# Patient Record
Sex: Male | Born: 1935
Health system: Southern US, Community
[De-identification: ages and names within clinical notes are randomized; demographics above are authoritative.]

## PROBLEM LIST (undated history)

## (undated) DIAGNOSIS — K567 Ileus, unspecified: Secondary | ICD-10-CM

## (undated) DIAGNOSIS — K226 Gastro-esophageal laceration-hemorrhage syndrome: Secondary | ICD-10-CM

## (undated) DIAGNOSIS — K219 Gastro-esophageal reflux disease without esophagitis: Secondary | ICD-10-CM

## (undated) DIAGNOSIS — Z5189 Encounter for other specified aftercare: Secondary | ICD-10-CM

## (undated) DIAGNOSIS — I10 Essential (primary) hypertension: Secondary | ICD-10-CM

## (undated) DIAGNOSIS — K209 Esophagitis, unspecified without bleeding: Secondary | ICD-10-CM

## (undated) DIAGNOSIS — Z87438 Personal history of other diseases of male genital organs: Secondary | ICD-10-CM

## (undated) DIAGNOSIS — E079 Disorder of thyroid, unspecified: Secondary | ICD-10-CM

## (undated) DIAGNOSIS — D649 Anemia, unspecified: Secondary | ICD-10-CM

## (undated) DIAGNOSIS — K922 Gastrointestinal hemorrhage, unspecified: Secondary | ICD-10-CM

## (undated) DIAGNOSIS — N400 Enlarged prostate without lower urinary tract symptoms: Secondary | ICD-10-CM

## (undated) HISTORY — PX: TRANSURETHRAL RESECTION OF PROSTATE: SHX73

## (undated) HISTORY — PX: THYROIDECTOMY: SHX17

## (undated) HISTORY — DX: Encounter for other specified aftercare: Z51.89

## (undated) HISTORY — PX: CHOLECYSTECTOMY: SHX55

---

## 2001-09-25 ENCOUNTER — Ambulatory Visit (HOSPITAL_COMMUNITY): Admission: RE | Admit: 2001-09-25 | Discharge: 2001-09-25 | Payer: Self-pay | Admitting: General Surgery

## 2003-05-15 ENCOUNTER — Inpatient Hospital Stay (HOSPITAL_COMMUNITY): Admission: AD | Admit: 2003-05-15 | Discharge: 2003-05-19 | Payer: Self-pay | Admitting: Internal Medicine

## 2003-05-16 ENCOUNTER — Encounter: Payer: Self-pay | Admitting: Internal Medicine

## 2003-05-27 ENCOUNTER — Encounter: Payer: Self-pay | Admitting: Internal Medicine

## 2003-05-27 ENCOUNTER — Ambulatory Visit (HOSPITAL_COMMUNITY): Admission: RE | Admit: 2003-05-27 | Discharge: 2003-05-27 | Payer: Self-pay | Admitting: Internal Medicine

## 2003-10-29 ENCOUNTER — Ambulatory Visit (HOSPITAL_COMMUNITY): Admission: RE | Admit: 2003-10-29 | Discharge: 2003-10-29 | Payer: Self-pay | Admitting: Internal Medicine

## 2003-10-29 HISTORY — PX: COLONOSCOPY: SHX174

## 2006-10-09 ENCOUNTER — Emergency Department (HOSPITAL_COMMUNITY): Admission: EM | Admit: 2006-10-09 | Discharge: 2006-10-09 | Payer: Self-pay | Admitting: Emergency Medicine

## 2006-10-15 ENCOUNTER — Inpatient Hospital Stay (HOSPITAL_COMMUNITY): Admission: RE | Admit: 2006-10-15 | Discharge: 2006-10-17 | Payer: Self-pay | Admitting: Urology

## 2006-10-15 ENCOUNTER — Encounter (INDEPENDENT_AMBULATORY_CARE_PROVIDER_SITE_OTHER): Payer: Self-pay | Admitting: *Deleted

## 2008-08-14 ENCOUNTER — Ambulatory Visit: Payer: Self-pay | Admitting: Internal Medicine

## 2008-08-31 ENCOUNTER — Ambulatory Visit: Payer: Self-pay | Admitting: Internal Medicine

## 2008-08-31 ENCOUNTER — Ambulatory Visit (HOSPITAL_COMMUNITY): Admission: RE | Admit: 2008-08-31 | Discharge: 2008-08-31 | Payer: Self-pay | Admitting: Internal Medicine

## 2008-08-31 HISTORY — PX: ESOPHAGOGASTRODUODENOSCOPY: SHX1529

## 2008-09-02 ENCOUNTER — Encounter: Payer: Self-pay | Admitting: Internal Medicine

## 2008-09-02 LAB — CONVERTED CEMR LAB: Creatinine, Ser: 1.14 mg/dL (ref 0.40–1.50)

## 2008-09-09 ENCOUNTER — Ambulatory Visit (HOSPITAL_COMMUNITY): Admission: RE | Admit: 2008-09-09 | Discharge: 2008-09-09 | Payer: Self-pay | Admitting: Internal Medicine

## 2008-09-16 ENCOUNTER — Ambulatory Visit (HOSPITAL_COMMUNITY): Admission: RE | Admit: 2008-09-16 | Discharge: 2008-09-16 | Payer: Self-pay | Admitting: Surgery

## 2008-09-17 ENCOUNTER — Ambulatory Visit: Payer: Self-pay | Admitting: Surgery

## 2008-11-19 ENCOUNTER — Encounter (INDEPENDENT_AMBULATORY_CARE_PROVIDER_SITE_OTHER): Payer: Self-pay | Admitting: Surgery

## 2008-11-19 ENCOUNTER — Ambulatory Visit: Payer: Self-pay | Admitting: Surgery

## 2008-11-19 ENCOUNTER — Ambulatory Visit (HOSPITAL_COMMUNITY): Admission: RE | Admit: 2008-11-19 | Discharge: 2008-11-22 | Payer: Self-pay | Admitting: Surgery

## 2008-12-30 ENCOUNTER — Encounter: Payer: Self-pay | Admitting: Internal Medicine

## 2009-01-27 ENCOUNTER — Encounter: Payer: Self-pay | Admitting: Internal Medicine

## 2010-12-15 LAB — CALCIUM
Calcium: 7.6 mg/dL — ABNORMAL LOW (ref 8.4–10.5)
Calcium: 7.8 mg/dL — ABNORMAL LOW (ref 8.4–10.5)
Calcium: 7.9 mg/dL — ABNORMAL LOW (ref 8.4–10.5)
Calcium: 8.3 mg/dL — ABNORMAL LOW (ref 8.4–10.5)
Calcium: 8.3 mg/dL — ABNORMAL LOW (ref 8.4–10.5)

## 2010-12-15 LAB — DIFFERENTIAL
Basophils Absolute: 0 10*3/uL (ref 0.0–0.1)
Basophils Relative: 0 % (ref 0–1)
Monocytes Relative: 9 % (ref 3–12)
Neutro Abs: 3.2 10*3/uL (ref 1.7–7.7)
Neutrophils Relative %: 56 % (ref 43–77)

## 2010-12-15 LAB — CBC
MCHC: 32.7 g/dL (ref 30.0–36.0)
Platelets: 239 10*3/uL (ref 150–400)
RBC: 5.35 MIL/uL (ref 4.22–5.81)
RDW: 16.5 % — ABNORMAL HIGH (ref 11.5–15.5)

## 2010-12-15 LAB — URINALYSIS, ROUTINE W REFLEX MICROSCOPIC
Ketones, ur: NEGATIVE mg/dL
Nitrite: NEGATIVE
Protein, ur: 100 mg/dL — AB
Urobilinogen, UA: 1 mg/dL (ref 0.0–1.0)

## 2010-12-15 LAB — BASIC METABOLIC PANEL
BUN: 11 mg/dL (ref 6–23)
CO2: 31 mEq/L (ref 19–32)
Calcium: 9.4 mg/dL (ref 8.4–10.5)
Creatinine, Ser: 1.12 mg/dL (ref 0.4–1.5)
GFR calc Af Amer: >60 mL/min (ref >60)

## 2010-12-15 LAB — PROTIME-INR
INR: 1 (ref 0.00–1.49)
Prothrombin Time: 13.1 seconds (ref 11.6–15.2)

## 2010-12-19 LAB — GLUCOSE, CAPILLARY: Glucose-Capillary: 103 mg/dL — ABNORMAL HIGH (ref 70–99)

## 2011-01-17 NOTE — Consult Note (Signed)
NEW PATIENT CONSULTATION   Victor Hunter, Victor Hunter  DOB:  [birth year]                                        September 17, 2008  CHART #:  [id number]   REASON FOR CONSULTATION:  Mediastinal mass.   CLINICAL HISTORY:  I was asked to see the patient in the [facility]  today by Dr. Roetta Sessions for evaluation of a large mediastinal mass.  He is a 77 year old African American male who was referred to Dr. Jena Gauss  for evaluation of persistent hiccups that is going on for about a week  and a half.  He had previously had hiccups in 2004 when he presented  with an upper GI bleed and ileus.  During his most recent presentation,  he said that he was having hiccups all day long.  They usually resolve  by the time, he went to bed, but then started again in the morning.  He  underwent an EGD on 08/31/2008, which showed possible extrinsic  compression on the midesophagus with a noncritical Schatzki ring.  He  has small hiatal hernia.  The stomach was normal.  The first and second  portion of the duodenum were normal.  He subsequently underwent a CT  scan of the chest to evaluate the esophageal compression.  This showed a  7.0 x 5.7 x 6.5-cm left superior mediastinal mass showing a significant  heterogeneous internal enhancement with small inferior calcification.  This was felt to be a possible thyroid malignancy.  There is no thoracic  adenopathy.  There is a 2-mm right lower lobe pulmonary nodule that was  nonspecific.  He subsequently underwent a PET scan on 09/16/2008, which  showed marked enlargement of the left thyroid lobe with low level  heterogeneous metabolism that was nonspecific.  The right lower lobe  lung nodule was only 2 mm and below the resolution of PET scan.  There  are no other areas of increased tracer uptake.  Also noted, there was  marked prostate enlargement.   REVIEW OF SYSTEMS:  General:  He denies any fever or chills.  He has had  no recent weight changes.   He denies fatigue.  Eyes:  Negative.  ENT:  Negative.  Endocrine:  He denies diabetes and hypothyroidism.  Cardiovascular:  He denies any chest pain or pressure.  He has had no  PND or orthopnea.  He denies social dyspnea.  He denies palpitations.  Respiratory:  He denies cough and sputum production.  He has had hiccups  as mentioned above.  GI:  He denies nausea and vomiting.  He reports  occasional dysphagia and feeling like things are sticking in his upper  chest on swallowing.  He has a history of constipation.  GU:  He denies  dysuria and hematuria.  He does have difficulty passing his urine at  times.  Vascular:  He denies claudication and phlebitis.  Neurological:  Denies any history of dizziness.  He denies any focal weakness or  numbness.  He does report passing out in the past.  He has never had TIA  or stroke.  Psychiatric:  Negative.  Hematological:  Negative.   ALLERGIES:  None.   MEDICATIONS:  HCTZ 25 mg daily, Protonix 40 mg daily, diltiazem XR 180  mg daily, and aspirin 81 mg daily.   PAST MEDICAL HISTORY:  Significant for history  of upper GI bleed in the  past associated with ileus in 2004.  This was felt to be secondary to a  Mallory-Weiss tear.  He has a history of chronic anemia.  He has a  history of hiccups as mentioned above.  He has a history of prostate  enlargement and elevated PSA and is followed by Dr. Rito Ehrlich.  He has a  history of distal esophageal mucosal erosions in 2005 and was biopsied  showing atypical squamous mucosa consistent with moderate-to-high-grade  squamous dysplasia.  Repeat endoscopy reportedly showed benign biopsies.  He has a history of inflammatory polyp removed from the colon in the  past.  He is status post cholecystectomy and status post right knee  arthroscopy.  He is status post surgery on his prostate, but he is not  sure what was done.   FAMILY HISTORY:  His sister has liver disease.  One sister died of  breast cancer.  He has  3 brothers with significant hypertension.  His  mother died from complications of hypertension.  Father died of a  stroke.   SOCIAL HISTORY:  He is married and has 4 grown children.  He is a  retired Naval architect.  He works part-time TEFL teacher.  He smoked,  but quit 30 years ago.  He occasionally drinks alcohol few times a year.   PHYSICAL EXAMINATION:  VITAL SIGNS:  His blood pressure is 137/81, his  pulse is 73 and regular, his respiratory rate is 18 and unlabored.  Oxygen saturation on room air is 97%.  GENERAL:  He is a stocky well-developed [place] American male, in no  distress.  HEENT:  Normocephalic and atraumatic.  Pupils are equal and reactive to  light and accommodation.  Extraocular muscles are intact.  His throat is  clear.  NECK:  Normal carotid pulses bilaterally.  There are no bruits.  There  is no adenopathy.  There is some prominence of the left side of the  neck, which may be due to this thyroid enlargement.  CARDIAC:  Regular rate and rhythm with normal S1 and S2.  There is no  murmur, rub, or gallop.  LUNGS:  Clear.  ABDOMEN:  Active bowel sounds.  His abdomen is soft and nontender.  There are no palpable masses or organomegaly.  EXTREMITIES:  No peripheral edema.  NEUROLOGIC:  Alert and oriented x3.  Motor and sensory exams are grossly  normal.   IMPRESSION:  The patient has a large left thyroid lobe mass, which I  think is most likely a goiter with substernal extension.  It is possible  there could be some malignancy within this, although his PET scan was  nonspecific.  The mediastinal extension could be responsible for some of  his symptoms including his hiccups and intermittent dysphagia.  I would  recommend surgical removal possibly through a neck incision, but this  may require partial sternotomy.  I will discuss the case with Dr. Darnell Level to get his opinion about whether we should perform a biopsy first  or proceed right to surgical removal.  I  discussed all this with the  patient and his family.  They are in full agreement.  I told him that I  would discuss with Dr. Gerrit Friends tomorrow and will call them with plans  after I do that.  They are in full gradient.   Evelene Croon, M.D.  Electronically Signed   BB/MEDQ  D:  09/17/2008  T:  09/17/2008  Job:  [id number]  cc:   Darnell Level, MD  R. Roetta Sessions, M.D.  Tesfaye D. Felecia Shelling, MD

## 2011-01-17 NOTE — Op Note (Signed)
Victor Hunter, Victor Hunter NO.:  [id number]   MEDICAL RECORD NO.:  [id number]          PATIENT TYPE:  OIB   LOCATION:  5151                         FACILITY:  [facility]   PHYSICIAN:  Velora Heckler, MD      DATE OF BIRTH:  [birth year]   DATE OF PROCEDURE:  11/19/2008  DATE OF DISCHARGE:                               OPERATIVE REPORT   PREOPERATIVE DIAGNOSIS:  Substernal thyroid goiter.   POSTOPERATIVE DIAGNOSIS:  Substernal thyroid goiter.   PROCEDURE:  Total thyroidectomy (cervical approach).   CO-SURGEONS:  Velora Heckler, MD and Evelene Croon, MD   ANESTHESIA:  General per Dr. Diamantina Monks.   ESTIMATED BLOOD LOSS:  Minimal.   PREPARATION:  Betadine.   COMPLICATIONS:  None.   INDICATIONS:  The patient is a 77 year old black male from [place],  [place].  He was undergoing a gastroenterology workup which  included upper endoscopy.  This demonstrated deviation of his esophagus.  CT scan of the chest was obtained and demonstrated a heterogeneous mass  in the left upper mediastinum which appeared to originate from the left  thyroid lobe.  It contained calcifications and was felt to be suspicious  for thyroid malignancy.  The patient was seen by Dr. Evelene Croon in  consultation.  He was referred to my office for thyroidectomy from  cervical approach with the possibility of median sternotomy if required.  The patient now comes to the operating room.   BODY OF REPORT:  Procedure was done in OR #11 at the [facility].  The patient is brought to the operating room, placed  in supine position on the operating room table.  Following  administration of general anesthesia, the patient is positioned and then  prepped and draped in usual strict aseptic fashion.  After ascertaining  that an adequate level of anesthesia have been achieved, a cervical  Kocher incision is made with a #15 blade.  Dissection was carried  through subcutaneous tissues and  platysma and hemostasis was obtained  with the electrocautery.  Skin flaps were elevated cephalad and caudad  from the thyroid notch to the sternal notch.  A Mahorner self-retaining  retractor was placed for exposure.  Larynx and airway are markedly  deviated to the right.  They are approximately 3 cm off of midline.  There is a large mass in the left thyroid bed.  There are numerous 1-2  cm cyst in the superior pole of the gland.  These were palpable.   Dissection is begun in the midline.  Strap muscles were incised in the  midline.  External jugular veins were divided between hemostats and  ligated with 2-0 silk ties.  Strap muscles are reflected to the left.  Left thyroid lobe was exposed.  It is gently mobilized with blunt  dissection.  Venous tributaries were divided between medium Ligaclips.  Gland is gently dissected out.  There is an inflammatory component.  Superior pole vessels are dissected out, ligated in continuity with 2-0  silk ties and medium Ligaclips and divided.  This allows for a plane to  be developed laterally and posteriorly back to the precervical fascia.  Dissection is carried distally where a large mass occupies the lower  left neck and extends into the mediastinum.  Venous tributaries are  divided between medium Ligaclips with the Harmonic scalpel.  Tissue  planes are developed allowing for mobilization of the mass.  Branches of  the inferior thyroid artery are divided between small and medium  Ligaclips with the Harmonic scalpel.  Both superior and inferior  parathyroid glands are identified and preserved on their vascular  pedicle.  With some difficulty, the large left thyroid lobe and inferior  pole mass is delivered out of the mediastinum and into the neck.  It was  rotated anteriorly.  The inferior venous tributaries were ligated in  continuity with 2-0 silk ties and divided with Harmonic scalpel.  Branches of the inferior thyroid artery are divided between  small and  medium Ligaclips.  The recurrent laryngeal nerve is identified and  preserved.  Ligament of Allyson Sabal is transected with electrocautery and the  gland is mobilized up and onto the anterior trachea.  A dry pack is  placed in the left neck.  The isthmus is transected with the Harmonic  scalpel and the entire left lobe is submitted to Pathology for review.   Next, we turned our attention to the right thyroid lobe.  Again strap  muscles are reflected laterally and the right lobe is exposed.  The  right lobe is small but quite firm.  There are no discrete nodules or  masses.  There are no cyst.  Superior pole vessels are dissected out and  divided between medium Ligaclips with the Harmonic scalpel.  Inferior  venous tributaries are divided between medium Ligaclips with the  Harmonic scalpel.  Middle thyroid vein is divided between small  Ligaclips with the Harmonic scalpel.  Gland is rolled anteriorly.  Inferior parathyroid gland is identified and preserved.  Branches of the  inferior thyroid artery are divided between small Ligaclips.  Recurrent  nerve is identified and preserved.  Again with some difficulty, the  ligament of Allyson Sabal is finally transected using the electrocautery for  hemostasis and preserving the recurrent nerve.  The remaining gland is  excised off the anterior trachea.  The entire lobe is submitted to  Pathology and labeled as right thyroid lobe.   Next, the neck is irrigated with warm saline bilaterally.  Hemostasis is  obtained with small and medium Ligaclips.  Surgicel is placed in the  operative field.  Strap muscles are reapproximated in the midline with  interrupted 3-0 Vicryl sutures.  Platysma was closed with interrupted 3-  0 Vicryl sutures.  Skin is closed with a running 4-0 Monocryl  subcuticular suture.  Wound is washed and dried and benzoin and Steri-  Strips are applied.  Sterile dressings are applied.  The patient is  awakened from anesthesia and  brought to the recovery room in stable  condition.  The patient tolerated the procedure well.      Velora Heckler, MD  Electronically Signed     TMG/MEDQ  D:  11/19/2008  T:  11/19/2008  Job:  [id number]   cc:   Evelene Croon, M.D.  Jonathon Bellows, M.D.  Tesfaye D. Felecia Shelling, MD

## 2011-01-17 NOTE — Consult Note (Signed)
NAME:  ABDURRAHMAN, Victor Hunter              ACCOUNT NO.:  [id number]   MEDICAL RECORD NO.:  [id number]           PATIENT TYPE:  AMB   LOCATION:  DAY                           FACILITY:  [facility]   PHYSICIAN:  R. Roetta Sessions, M.D. DATE OF BIRTH:  20-Nov-1933   DATE OF CONSULTATION:  DATE OF DISCHARGE:                                 CONSULTATION   REASON FOR CONSULTATION:  Persistent hiccups.   HPI:  Victor Hunter is a 77 year old African American male.  He has had  persistent hiccups for about a week and a half now.  He has previously  presented with persistent hiccups back in 2004 when he had upper GI  bleed and ileus.  He tells me his hiccups are lasting all day long.  They usually resolve by the time he goes to bed and they are back again  first thing in the morning.  He denies any heartburn or indigestion.  He  is taking pantoprazole 40 mg daily and has for years.  He denies any  nausea, vomiting, dysphagia, odynophagia, anorexia, or early satiety.  Denies any abdominal pain or bloating.  His weight has remained stable.  He denies any headache, dizziness, or neurologic changes.  He  occasionally has constipation.  He has used some Dulcolax for this.  He  can go up to 3 days without a bowel movement.  He denies any rectal  bleeding or melena.   PAST MEDICAL AND SURGICAL HISTORY:  1. He has history of upper GI bleed, ileus.  2. When he presented with hiccups, was admitted to [facility] and transferred to [facility].  Felt to have      Mallory-Weiss tear.  3. History of chronic anemia.  4. Prostatomegaly and elevated PSA.  5. He has history of distal esophageal mucosal erosions which appeared      abnormal by Dr. Jena Gauss.  On October 29, 2003, EGD.  He was sent to      [facility] for EUS.  His      biopsies from [facility] showed atypical stratified squamous mucosa      consistent with moderate to high-grade squamous dysplasia  occurring      in the background of chronic active esophagitis.  EUS was deferred      and repeat EGD was performed by Dr. Opal Sidles with biopsies that were      benign.  6. He had an inflammatory polyp removed from 40 cm on colonoscopy by      Dr. Jena Gauss, October 29, 2003.  7. He is status post cholecystectomy.  8. He has had right knee arthroscopy.  9. Some type of bladder surgery.   CURRENT MEDICATIONS:  1. Hydrochlorothiazide 25 mg daily.  2. Pantoprazole 40 mg daily.  3. Diltiazem 180 mg daily.  4. Aspirin 81 mg daily.   ALLERGIES:  NO KNOWN DRUG ALLERGIES.   FAMILY HISTORY:  Positive for sister with liver problems, etiology  unknown.  He has lost a sister to breast carcinoma.  He has 3 brothers  with history significant for hypertension.  Mother deceased secondary to  complications from hypertension.  Father deceased secondary to CVA.   SOCIAL HISTORY:  Victor Hunter is married.  He has 4 grown healthy  children.  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  He denies any tobacco or drug use.  He consumes a couple  alcoholic beverages per year.   REVIEW OF SYSTEMS:  See HPI, otherwise negative.   PHYSICAL EXAM:  VITAL SIGNS:  Weight 175 pounds.  Height 63 inches.  Temperature 98.5.  blood pressure 142/82.  Pulse 88.  GENERAL:  He is a well-developed, well-nourished, elderly, African  American male who is alert, oriented, pleasant, and cooperative.  HEENT:  Sclerae clear, nonicteric. Conjunctivae pink.  Oropharynx pink  and moist without lesions.  NECK:  Supple without any mass or thyromegaly.  CHEST:  Heart, regular rate and rhythm.  Normal S1 and S2 without  murmurs, clicks, rubs, or gallops.  LUNGS:  Clear to auscultation bilaterally.  ABDOMEN:  Positive bowel sounds x4.  No bruits auscultated.  Soft,  nontender, nondistended without palpable mass or hepatosplenomegaly.  No  rebound tenderness or guarding.  EXTREMITIES:  Without clubbing  or edema.   ASSESSMENT:  Victor Hunter is a 77 year old African American male with  recurrent hiccups of a week and a half duration.  He has history of this  previously back in 2004 when he had upper GI bleed and ileus suspected  to be due to Mallory-Weiss tear.  He shows no signs of ileus on  abdominal exam today.  He did have abnormal distal esophagus on last EGD  by Dr. Jena Gauss and I feel he is going to require further evaluation of his  upper GI tract to rule out peptic ulcer disease, gastric outlet  obstruction, or any other lesion which could be irritating diaphragm.   PLAN:  EGD with Dr. Jena Gauss in the near future.  Discuss this procedure  including risks and benefits which include but are not limited to  bleeding, infection, perforation, drug reaction and he agrees and  planned consent will be obtained.  Continue pantoprazole 40 mg daily.   Thank you, Dr. Felecia Shelling, for allowing [place] to participate in the care of [name].      Lorenza Burton, N.P.      Jonathon Bellows, M.D.  Electronically Signed    KJ/MEDQ  D:  08/14/2008  T:  08/14/2008  Job:  [id number]   cc:   Tesfaye D. Felecia Shelling, MD  Fax: [phone]

## 2011-01-17 NOTE — Op Note (Signed)
NAME:  Victor Hunter, Victor Hunter NO.:  [id number]   MEDICAL RECORD NO.:  [id number]          PATIENT TYPE:  AMB   LOCATION:  DAY                           FACILITY:  [facility]   PHYSICIAN:  R. Roetta Sessions, M.D. DATE OF BIRTH:  Dec 17, 1933   DATE OF PROCEDURE:  08/31/2008  DATE OF DISCHARGE:                               OPERATIVE REPORT   INDICATIONS FOR PROCEDURE:  A 77 year old gentleman with recurrent  intermittent hiccups.  He has had symptoms intermittently for years.  EGD is now being done.  Risks, benefits, alternatives, and limitations  have been reviewed previously and again today at the bedside.  Please  see the documentation medical record.   PROCEDURE NOTE:  O2 saturation, blood pressure, pulse, and respirations  were monitored throughout the entire procedure.   CONSCIOUS SEDATION:  Versed 3 mg IV, Demerol 75 mg IV in divided doses.  Cetacaine spray for topical pharyngeal anesthesia.   INSTRUMENTATION:  Pentax video chip system.   FINDINGS:  Examination of the tubular esophagus revealed a prominent  submucosal vascular pattern in the very proximal esophagus atypical for  esophageal varices.  Please see photos.  Also, there appeared to be some  extrinsic compression on the mid esophageal body encroaching on the  lumen.  Please see photos.  There was a noncritical Schatzki ring,  otherwise esophageal mucosa appeared entirely normal.  EG junction  easily traversed.  Stomach:  Gastric cavity was emptied and insufflated  well with air.  Thorough examination of the gastric mucosa including  retroflexed view of the proximal stomach esophagogastric junction  demonstrated multiple 1-3 mm hyperplastic appearing fundic gland polyps  and a small hiatal hernia only.  There was no ulcer infiltrating process  seen.  Pylorus was patent, easily traversed.  Examination of the bulb  and second portion revealed no abnormalities.  Therapeutic/diagnostic  maneuvers were performed,  none.   The patient tolerated the procedure well and was reacted in endoscopy.   IMPRESSION:  Possible extrinsic impression on the mid esophagus,  noncritical Schatzki ring, not manipulated prominent submucosal vascular  pattern in the very proximal esophagus atypical for esophageal varices.  One 3-mm fundal gland polyps and multiple gastric fundic gland polyps  not manipulated, small hiatal hernia, otherwise normal stomach and  patent pylorus, normal D1 and D2.   RECOMMENDATIONS:  We will proceed with abdominal and chest CT to further  evaluate his symptoms and endoscopic findings.  Further recommendations  to follow him in the very near future.      Jonathon Bellows, M.D.  Electronically Signed     RMR/MEDQ  D:  08/31/2008  T:  08/31/2008  Job:  [id number]   cc:   Tesfaye D. Felecia Shelling, MD  Fax: [phone]

## 2011-01-20 NOTE — Op Note (Signed)
NAME:  Victor Hunter, Victor Hunter                        ACCOUNT NO.:  [id number]   MEDICAL RECORD NO.:  [id number]                   PATIENT TYPE:  AMB   LOCATION:  DAY                                  FACILITY:  [facility]   PHYSICIAN:  R. Roetta Sessions, M.D.              DATE OF BIRTH:  07-07-1934   DATE OF PROCEDURE:  DATE OF DISCHARGE:                                 OPERATIVE REPORT   PROCEDURE:  Esophagogastroduodenoscopy with biopsy followed by colonoscopy.   ENDOSCOPIST:  Gerrit Friends. Rourk, M.D.   INDICATIONS FOR PROCEDURE:  The patient is a 77 year old gentleman with  chronic anemia and history of Mallory-Weiss tear with recurrent hiccups.  He  has been fairly extensively evaluated last year.  He has now had recurrent  hiccups.  It has been some time since he had a colonoscopy.  EGD and  colonoscopy are now being done.  This approach has been discussed with the  patient at length, previously.  The potential risks, benefits, and  alternatives have been reviewed; questions answered.  It is notable that he  is Hemoccult positive.  He has an elevated PSA in the 15 range which has not  been worked up as of yet.   INSTRUMENT:  Olympus GIF-140 gastroscope and CF 140 colonoscope.   EGD FINDINGS:  Examination of the tubular esophagus revealed a denuded  appearing, erythematous mucosa involving one-third of the circumferential of  the distal one-third of the esophagus.  This appeared to be a very wide  erosion with a couple of islands of normal appearing esophageal mucosa.  This does not appear consistent with Barrett's esophagus; please see photos.  The esophageal mucosa otherwise appeared normal.  The EG junction was easily  traversed.   STOMACH:  The gastric cavity was empty.  It insufflated well with air.  A  thorough examination of the gastric mucosa including a retroflex view of the  proximal stomach and esophagogastric junction demonstrated a small hiatal  hernia.  The pylorus was patent  and easily traversed.  Examination of the  bulb and the second portion revealed no abnormalities.   THERAPEUTIC/DIAGNOSTIC MANEUVERS:  The area of abnormality in the distal  esophagus was biopsied for histologic study.   The patient tolerated the procedure well and was prepared for colonoscopy.  A digital rectal exam revealed no abnormalities.  Initially I started with  the pediatric colonoscope.   ENDOSCOPIC FINDINGS:  The prep was adequate.   RECTUM:  Examination of the rectal mucosa including the retroflex view of  the anal verge revealed no abnormalities.   COLON:  The colonic mucosa was surveyed well into the descending colon.  The  mucosa of the colon was redundant and because of recurrent looping the  pediatric colonoscope was not stiff enough to do the job so I pulled it out  and got the adult scope and advanced the scope around to the cecum.  It did  a combination of changing of the patient's position and external abdominal  pressure to reach the cecum.  The patient had a few scattered pancolonic  diverticula. The cecum, ileocecal valve, and appendiceal orifice were well  seen and photographed for  the record.   From this level the scope was slowly withdrawn and all previously mentioned  mucosal surfaces were again seen.  There was a 3-mm hyperplastic appearing  polyp which was cold biopsied at 40 cm otherwise the colonic mucosa appeared  normal.  The patient tolerated both procedures well and was reacted in  endoscopy.   EGD IMPRESSION:  1. A third area of wide erosion versus some denuding of the distal     esophageal mucosa would be somewhat atypical for a reflux induced injury;     however, findings were nonspecific.  The area was biopsied, otherwise     normal esophagus.  2. Small hiatal hernia otherwise normal stomach.  3. Normal D1 and D2.   COLONOSCOPY FINDINGS:  1. Normal rectum.  2. Scattered pancolonic diverticula, redundant elongated colon,  3. The  remainder of the polyp at 40 cm cold biopsied.   RECOMMENDATIONS:  1. Will follow up on path.  2. Further recommendations to follow.  3. Diverticulosis literature given to Victor Hunter.      ___________________________________________                                            Victor Hunter, M.D.   RMR/MEDQ  D:  10/29/2003  T:  10/29/2003  Job:  [id number]   cc:   Tesfaye D. Felecia Shelling, M.D.  [address]  [place]  [place] [address]  Fax: [phone]   R. Roetta Sessions, M.D.  [address]  [place]  [place] [address]  Fax: [phone]

## 2011-01-20 NOTE — Discharge Summary (Signed)
   NAME:  Victor Hunter, Victor Hunter                        ACCOUNT NO.:  [id number]   MEDICAL RECORD NO.:  [id number]                   PATIENT TYPE:  INP   LOCATION:  A216                                 FACILITY:  [facility]   PHYSICIAN:  Tesfaye D. Felecia Shelling, M.D.              DATE OF BIRTH:  05-23-1934   DATE OF ADMISSION:  05/15/2003  DATE OF DISCHARGE:  05/19/2003                                 DISCHARGE SUMMARY   DISCHARGE DIAGNOSES:  1. Gastrointestinal bleed.  2. Severe anemia secondary to above.  3. Ileus.  4. Recurrent hiccups.  5. Enlarged prostate on CT scan.  6. History of hypertension.   DISCHARGE MEDICATIONS:  1. Protonix 40 mg p.o. daily.  2. Keflex 150 mg p.o. b.i.d.  3. Hydrochlorothiazide 25 mg p.o. daily.   DISPOSITION:  The patient was discharged to home in stable condition.   HOSPITAL COURSE:  This is a 77 year old black male with a history of  hypertension, who was transferred from [facility] due to GI bleed.  The patient was admitted to [facility] on May 13, 2003 with  complaint of coffee-ground vomiting and abdominal pain.  He had, also,  recurrent hiccups.  The family requested for transfer to my service.  According to the EEG done in [facility], the patient was found to  have ulceration of the distal endoesophagus.  On admission to [facility], the patient had hemoglobin of 8 with hematocrit of 24.  He was  typed and transfused 2 units of blood cells.  GI consult was done and  assisted in the treatment.  CT scan of the abdomen was ordered due to his  ileus.  His CT scan showed only enlargement of the prostate.  His PSA was  also elevated.  The patient was treated with IV Protonix and IV fluid.  Through the hospital stay, the patient gradually improved.  His symptoms  subsided and his hemoglobin and hematocrit was improved after transfusion.  The patient was discharged to his appointment, to follow with urology and  GI.      ___________________________________________                                         Eustaquio Maize. Felecia Shelling, M.D.   TDF/MEDQ  D:  06/15/2003  T:  06/15/2003  Job:  [id number]

## 2011-01-20 NOTE — Op Note (Signed)
[facility]  Patient:    Victor Hunter, Victor Hunter Visit Number: [id number] MRN: [id number]          Service Type: DSU Location: DAY Attending Physician:  Dalia Heading Dictated by:   Victor Hunter, M.D. Proc. Date: 09/25/01 Admit Date:  09/25/2001 Discharge Date: 09/25/2001               Avon Gully, M.D.   Operative Report  PATIENT AGE:  77 years old  PREOPERATIVE DIAGNOSIS:  Chronic cholecystitis.  POSTOPERATIVE DIAGNOSIS:  Chronic cholecystitis.  OPERATION:  Laparoscopic cholecystectomy.  SURGEON:  Victor Hunter, M.D.  ASSISTANT:  Arna Snipe, M.D.  ANESTHESIA:  General endotracheal anesthesia.  INDICATIONS:  The patient is a 77 year old black male who presents with biliary colic secondary to chronic cholecystitis.  The risks and benefits of the procedure including bleeding, infection, hepatobiliary injury, and the possibility of an open procedure were fully explained to the patient who gave informed consent.  DESCRIPTION OF PROCEDURE:  The patient was placed in the supine position. After induction of general endotracheal anesthesia, the abdomen was prepped and draped using the usual sterile technique with Betadine.  A supraumbilical incision was made down to the fascia.  A Veress needle was introduced into the abdominal cavity, and confirmation of placement was done using the saline drop test.  The abdomen was then insufflated to 16 mmHg pressure.  An 11 mm trocar was introduced into the abdominal cavity under direct visualization without difficulty.  The patient was placed in reverse Trendelenburg position, and an additional 11 mm trocar was placed in the epigastric region, and 5 mm trocars were placed in the right upper quadrant and right flank regions.  The liver was inspected and noted to be within normal limits.  The gallbladder was retracted superiorly and laterally.  The dissection was begun around the infundibulum of the  gallbladder.  The cystic duct was first identified.  Its juncture to the infundibulum was fully identified.  Endoclips were placed proximally and distally on the cystic duct, and the cystic duct was divided.  This was likewise done on the cystic artery. The gallbladder was then freed away from the gallbladder fossa using Bovie electrocautery.  The gallbladder was delivered through the epigastric trocar site using an Endocatch bag without difficulty.  The gallbladder fossa was inspected, and no abnormal bleeding or bile leakage was noted.  All fluid and air were then evacuated from the abdominal cavity prior to removal of the trocars.  All wounds were irrigated with normal saline.  All wounds were injected with 0.5% Marcaine.  The supraumbilical fascia was reapproximated using an 0 Vicryl interrupted suture.  All skin incisions were closed using staples.  Betadine ointment and dry sterile dressings were applied.  All tape and needle counts were correct at the end of the procedure.  The patient was extubated in the operating room and went back to the recovery room awake and in stable condition.  COMPLICATIONS:  None.  SPECIMEN:  Gallbladder.  ESTIMATED BLOOD LOSS:  Minimal. Dictated by:   Victor Hunter, M.D. Attending Physician:  Dalia Heading DD:  09/25/01 TD:  09/26/01 Job: [id number]

## 2011-01-20 NOTE — H&P (Signed)
NAME:  Victor Hunter, Victor Hunter                        ACCOUNT NO.:  [id number]   MEDICAL RECORD NO.:  [id number]                   PATIENT TYPE:  INP   LOCATION:  A216                                 FACILITY:  [facility]   PHYSICIAN:  Tesfaye D. Felecia Shelling, M.D.              DATE OF BIRTH:  11-Sep-1933   DATE OF ADMISSION:  05/15/2003  DATE OF DISCHARGE:                                HISTORY & PHYSICAL   CHIEF COMPLAINT:  Recurrent hiccups, coffee ground vomiting, and abdominal  distention.   HISTORY OF PRESENT ILLNESS:  This is a 77 year old black male with history  of hypertension who was recently admitted to [facility] due to GI  bleed.  The patient was admitted to [facility] on May 13, 2003  after he presented with coffee ground vomiting, abdominal pain, and  recurrent hiccups.  The patient was admitted to [facility] and he had  EGD done by Dr. Linna Darner.  According to his EGD report, the patient was found  to have some ulceration at the distal end of the esophagus.  There was no  active bleeding.  However, the patient continued to have hiccups.  His  hemoglobin dropped from 9.6 to 8.1 yesterday.  The patient was noted to have  abdominal distention and absence of bowel sounds.  He was tried to be  transfused 2 units of packed rbc's; however, the patient developed a  reaction to transfusion.  The family member requested the patient to be  transferred under my service since he was my patient for the last several  years.  Arrangement was made and the patient was transferred here.   REVIEW OF SYSTEMS:  The patient has hiccups and abdominal discomfort.  He  feels short of breath but no cough or chest pain.  No hematemesis or melena  at this time.  No dysuria, urgency, or frequency of urination.  No leg  edema.   PAST MEDICAL HISTORY:  1. Hypertension.  2. History of cholecystectomy.   CURRENT MEDICATIONS:  1. Hydrochlorothiazide 25 mg p.o. daily.  2. Aspirin 81 mg p.o.  daily.   SOCIAL HISTORY:  The patient is married.  He has no history of alcohol,  tobacco, or substance abuse.   PHYSICAL EXAMINATION:  GENERAL:  The patient is alert, awake, but acutely  sick looking and has an NG tube which is on intermittent suction.  HEENT:  Pupils are equal, reactive.  NECK:  Supple.  CHEST:  Decreased air entry, few rhonchi.  CARDIOVASCULAR:  First and second heart sounds heard; no murmur, no gallop.  ABDOMEN:  Markedly distended and no bowel sounds, no area of tenderness or  guarding.  EXTREMITIES:  No leg edema.   ASSESSMENT:  This is a 77 year old male patient with a history of recent  coffee ground vomiting and hiccups who was admitted to [facility].  His EGD, according to Dr. Linna Darner, showed ulceration on the distal  esophagus  without any sign of bleeding.  The patient dropped his hemoglobin from 9.6  to 8.1 on day of transfer here.  The patient also has significant abdominal  distention and absence of bowel sounds.  The patient has also a low-grade  fever.  The etiology of his ileus and low-grade fever is not very clear.   PLAN:  Will do a STAT CT scan of his abdomen.  Will continue the patient on  proton pump inhibitor.  Will cover the patient with empiric IV antibiotics.  I have discussed the case with Dr. Jena Gauss who is going to evaluate the  patient and will also type and cross match and try to transfuse the patient  2 units of packed red blood cells.                                               Tesfaye D. Felecia Shelling, M.D.    TDF/MEDQ  D:  05/16/2003  T:  05/16/2003  Job:  [id number]

## 2011-01-20 NOTE — Op Note (Signed)
[name], Victor Hunter NO.:  [id number]   MEDICAL RECORD NO.:  [id number]          PATIENT TYPE:  INP   LOCATION:  A311                          FACILITY:  [facility]   PHYSICIAN:  Ky Barban, M.D.DATE OF BIRTH:  1934-08-03   DATE OF PROCEDURE:  10/15/2006  DATE OF DISCHARGE:                               OPERATIVE REPORT   PREOPERATIVE DIAGNOSIS:  Benign prostatic hypertrophy and acute urinary  retention.   POSTOPERATIVE DIAGNOSIS:  Benign prostatic hypertrophy and acute urinary  retention.   OPERATION/PROCEDURE:  Transurethral resection of the prostate.   ANESTHESIA:  Spinal.   DESCRIPTION OF PROCEDURE:  The patient given spinal anesthesia, placed  in lithotomy position, and prepped and draped.  A #28 Iglesias  resectoscope was introduced into the bladder.  The median lobe was  resected up to the level of the verumontanum.  Then bladder neck was  circumferentially dissected down to the circular fibers.  Resectoscope  was pulled back.  At the level verumontanum, rotated to 11 o'clock  position.  Resection of right lobe was done between 11 and 7 o'clock  position.  Similarly, the left lobe was resected between 1 and 5 o'clock  position.  Then the anterior midline tissues were resected.  The  posterior midline and apical tissue was resected very carefully not to  injure the sphincter or the verumontanum.  The prostatic urethra looks  well.  The chips were evacuated.  Bleeders were coagulated.  Resectoscope was pulled back and #22 three-way Foley catheter left in  for drainage.  The patient left the operating room in satisfactory  condition.      Ky Barban, M.D.  Electronically Signed     MIJ/MEDQ  D:  10/15/2006  T:  10/16/2006  Job:  [id number]

## 2011-01-20 NOTE — Consult Note (Signed)
NAME:  Victor Hunter, Victor Hunter NO.:  [id number]   MEDICAL RECORD NO.:  [id number]                  PATIENT TYPE:   LOCATION:                                       FACILITY:   PHYSICIAN:  R. Roetta Sessions, M.D.              DATE OF BIRTH:  [birth year]   DATE OF CONSULTATION:  10/27/2003  DATE OF DISCHARGE:                                   CONSULTATION   REASON FOR CONSULTATION:  Persistent hiccups for one week.   HISTORY OF PRESENT ILLNESS:  Victor Hunter is a 77 year old African-American  male who has been followed in our office for a suspected GI bleeding,  including probable Mallory-Weiss tear in November 2004.  He was admitted to  [facility] as a transfer from [facility] when he presented  with an ileus and hiccups.  Reportedly, he had an EGD by Dr. Linna Darner, which  revealed distal esophageal ulcer, however, this was felt later to be a  possible Mallory-Weiss tear by Dr. Jena Gauss.  CT scan showed fluid filled loops  of small bowel consistent with a possible early partial small bowel  obstruction.  He was doing quite well until a week ago when he developed  hiccups.  He denies any abdominal pain.  He denies any nausea, vomiting,  diarrhea, or constipation.  Bowel movements have been normal, soft, and  brown on a daily or every other day basis without any mucus, bleeding, or  melena.  He was given a prescription for Reglan 10 mg to take q.i.d.  yesterday.  He has had three doses thus far.  He denies any fever, however,  he is complaining of chills as well as fatigue.  He is complaining of  anorexia as well.  He was also found to have hypokalemia with a potassium of  3.4 on October 22, 2003, and was given a dose of 20 mEq of potassium.  He  denies any headache, dizziness, or blurred vision.  Reportedly, last  colonoscopy was more than five years ago by Dr. Linna Darner as well, with a polyp  that was not biopsied.  Also of note, is markedly enlarged  prostate with a  __________ appearance on CT scan from May 16, 2003.  Reportedly, he  had elevated PSA at 13.2.  He underwent small-bowel follow-through which was  normal on May 27, 2003, as well.  At last visit, he was asked to  follow up with his primary care physician regarding his elevated PSA and  prostatic hypertrophy on CT scan, however, per his report, no further workup  has been initiated.  He does have a history of chronic anemia as well which  has responded well to iron with last hemoglobin of 14.5 and hematocrit of 45  on August 07, 2003.  Most recent CBC from Dr. Letitia Neri office from October 22, 2003, reveals a drop in his hemoglobin to 12.7 and hematocrit of 40.5  with MCV of 78.  He currently is not on iron.  Of note is well, BUN is also  slightly elevated at 31.   PAST MEDICAL HISTORY:  1. GI history as dictated in the HPI.  2. Hypertension.  3. History of cholecystectomy for chronic cholecystitis by Dr. Lovell Sheehan in     January 2003.   CURRENT MEDICATIONS:  1. Diltiazem 180 mg daily.  2. Protonix 40 mg daily.  3. Hydrochlorothiazide 25 mg daily.  4. Reglan 10 mg q.i.d.   ALLERGIES:  No known drug allergies.   FAMILY HISTORY:  Negative for chronic GI disease, liver disease, or  colorectal carcinoma.   SOCIAL HISTORY:  Victor Hunter denies any tobacco, alcohol, or drug use.   REVIEW OF SYSTEMS:  CONSTITUTIONAL:  Weight is down 4 pounds since September  2004.  He is complaining of anorexia as well as fatigue.  CARDIOVASCULAR:  Denies any chest pain or palpitations.  PULMONARY:  Denies any shortness of  breath, dyspnea, or hemoptysis.  HEMATOLOGIC:  Denies any history of known  blood dyscrasias.   PHYSICAL EXAMINATION:  VITAL SIGNS:  Weight 176.75 pounds, temperature 98.1,  blood pressure 120/80, pulse 88.  GENERAL:  Victor Hunter is a 77 year old African-American male with obvious  chronic hiccups.  He is accompanied by his daughter today.  He is  alert,  oriented, pleasant, and cooperative.  HEENT:  Sclerae are clear and nonicteric.  Conjunctivae pink.  Oropharynx  pink and moist without lesions.  NECK:  Supple without any mass or thyromegaly.  CHEST:  Heart regular rate and rhythm with normal S1 and S2 without any  murmurs, clicks, rubs, or gallops.  LUNGS:  Clear to auscultation bilaterally.  ABDOMEN:  Rounded, positive bowel sounds x4.  Soft, nontender, nondistended,  no palpable mass or hepatosplenomegaly.  EXTREMITIES:  2+ pedal pulses bilaterally.  No edema.  NEUROLOGIC:  PERRLA.  Cranial nerves II-XII intact.  Good equal strength  bilaterally.  Ambulates without difficulty.  RECTAL:  There are a few small external hemorrhoids visualized, non-  erythematous or thrombosed.  Good sphincter tone.  A small amount of light  brown Hemoccult positive stool in the vault.  Prostate is very firm and  enlarged and nontender.   ASSESSMENT:  Victor Hunter is a 77 year old African-American male with a  second episode of chronic hiccups of one week's duration:  Victor Hunter  symptoms today are very concerning given his history of questionable ileus,  as well as Hemoccult positive stools today.  Would be very concerned of mass  effect on the diaphragm which is causing his hiccups.  Further workup  definitely warranted.  It is somewhat reassuring that he had a normal small  bowel series back in September 2004, however, he may have developed another  ileus.  Last colonoscopy was multiple years ago.  Therefore, a colorectal  carcinoma should be ruled out as well.  It is very concerning that he has  enlarged prostate and elevated PSA, and this should be further evaluation if  it has not already.  I have discussed this case in detail with Dr. Jena Gauss and  given the patient's presentation, we will schedule  esophagogastroduodenoscopy and colonoscopy as soon as possible for further evaluation.  I will also begin prostatic hypertrophy workup,  however, will  ultimately need to be followed by an urologist.   RECOMMENDATIONS:  1. Continue proton pump inhibitor for now and current medications.  2. We will schedule an esophagogastroduodenoscopy and colonoscopy to be  performed in the next day or two.  I have discussed this procedure with     Mr. Ditmer, including risks and benefits which include, but are not     limited to bleeding, infection, perforation.  He agrees with this plan.  3. We will check PSA as well as liver function tests now.  4. We will consider urology consult for prostatic hypertrophy as well as     elevated PSA.  5. Would consider further neuro evaluation and possible head CT scan pending     gastrointestinal workup.   We would like to thank Dr. Felecia Shelling for allowing [place] to participate in the care  of Mr. Kawahara.     ________________________________________  ___________________________________________  Nicholas Lose, N.P.                  Jonathon Bellows, M.D.   KC/MEDQ  D:  10/27/2003  T:  10/27/2003  Job:  [id number]   cc:   Tesfaye D. Felecia Shelling, M.D.  [address]  [place]  [place] [address]  Fax: [phone]   R. Roetta Sessions, M.D.  [address]  [place]  [place] [address]  Fax: [phone]

## 2011-01-20 NOTE — Consult Note (Signed)
NAME:  Victor Hunter, Victor Hunter                        ACCOUNT NO.:  [id number]   MEDICAL RECORD NO.:  [id number]                   PATIENT TYPE:  INP   LOCATION:  A216                                 FACILITY:  [facility]   PHYSICIAN:  R. Roetta Sessions, M.D.              DATE OF BIRTH:  01/31/1934   DATE OF CONSULTATION:  05/15/2003  DATE OF DISCHARGE:                                   CONSULTATION   CONSULTING PHYSICIAN:  R. Roetta Sessions, M.D.   REFERRING PHYSICIAN:  Dr. Ninetta Lights D. Fanta.   PRIMARY CARE PHYSICIAN:  Dr. Ninetta Lights D. Fanta.   REASON FOR CONSULTATION:  Apparent GI bleed, abdominal distention.   HISTORY OF PRESENT ILLNESS:  [name]. Hyneman is a pleasant 77 year old  African American male with a history of hypertension, who was admitted to  [facility] on May 13, 2003 after a couple of days' history of  vague nonspecific upper abdominal pain.  On May 13, 2003, he started  having nausea and vomiting and according to his wife, he had some coffee-  grounds material and old blood in the vomitus; there was no bright red  blood.  He came to the emergency department at [facility], where he  was found to be orthostatic.  An NG tube was placed and they did recover  some coffee-grounds gastric contents.  During this time he started  experiencing hiccups, which he has continued to display.   His admission hemoglobin was 9.7 with an MCV of 77; on May 14, 2003, it  was 9.5; this morning, 8.2.  After being given a suppository to facilitate a  BM, he did have a large tarry black bowel movement.  One unit of packed red  blood cells was ordered and early-on during the infusion, he complained of  chest pain and apparently his eyes rolled back and transfusion was stopped.  He has had some associated dyspnea.  According to Dr. Linna Darner, he tells me via  telephone this evening that antibody screen was negative through the  [facility].   Spiral CT was done  today which ruled out a pulmonary embolus, but revealed a  left superior mediastinal mass, likely related to the thyroid.  An EGD was  performed on May 13, 2003 by Dr. Linna Darner at [facility] and this  revealed a distal esophageal ulcer, some mild duodenitis and gastritis;  there was no active bleeding.  Serial white counts at [facility] and  during this hospitalization have been within normal limits.  Liver profile  from May 13, 2003 at [facility]:  SGOT 22, SGPT 14, total  bilirubin 0.7, amylase 76, lipase 16.  A CLOtest was done at the time of EGD  and it was pending at the time of this dictation.  Acute flat and upright  abdominal film today at [facility] demonstrated gas about the colon  and small bowel, no evidence of  free air or obstruction.   Dr. Linna Darner reported to me that a year or so ago, the patient presented  similarly at [facility] with hiccups and hematemesis.  EGD at that  time failed to demonstrate a cause of his symptoms, although he had some  changes consistent with gastroesophageal reflux disease.   The patient's wife tells me that he underwent a colonoscopy some time ago  and colonic polyps were found and removed.  She also reports that he was  prescribed Protonix for reflux some 1 year ago and took 30 pills, or 1  prescription, and then stopped the medication.  He has not been taking any  NSAIDs, aside from one aspirin a day, and takes hydrochlorothiazide.  Mr.  Squier denies chronic symptoms including odynophagia, dysphagia, early  satiety, nausea or vomiting; has not had any abdominal pain until this past  week; has not had melena or rectal bleeding; no change in bowel habits; has  had no change in weight.  There is no family history of chronic GI or liver  illness, including neoplasia.   Because the patient's primary care physician, Dr. Felecia Shelling, is located at [facility], it was requested that he be transferred here  and indeed, he  was transferred as an inpatient to Dr. Letitia Neri service this evening and I  was consulted.   PAST MEDICAL HISTORY:  1. GI problems as outlined above.  2. Also history of hypertension.  3. History of cholecystectomy (laparoscopic) for chronic cholecystitis by     Dr. Dalia Heading here at [facility] in January 2003 (those     records are currently unavailable).   OUTPATIENT MEDICATIONS:  Hydrochlorothiazide daily; aspirin daily.   ALLERGIES:  No known drug allergies.   FAMILY HISTORY:  Family history negative for chronic GI or liver disease.   SOCIAL HISTORY:  The patient is married and has four daughters.  He works as  a Electrical engineer at [employer].  He lives in [place].  He does not smoke  or consume alcohol.   REVIEW OF SYSTEMS:  Review of systems as in history of present illness.   PHYSICAL EXAMINATION:  GENERAL:  Physical examination reveals a pleasant 77-  year-old [place] American male with an NG tube in place with a small amount  of coffee-grounds material in the NG cannister.  He has four daughters and  his wife at the bedside.  He is alert, conversant and in no acute distress.  VITAL SIGNS:  Temperature is 100.3, orally, and pulse 100, respiratory rate  20, BP 122/69.  SKIN:  Skin warm and dry.  HEENT:  No scleral icterus.  Conjunctivae are somewhat pale.  Oral cavity:  No lesions.  NECK:  JVD is not prominent.  CHEST:  Lungs are clear to auscultation.  CARDIAC:  Regular rate and rhythm without murmur, gallop or rub.  ABDOMEN:  Abdomen is moderately distended.  Bowel sounds are very  hypoactive.  The abdomen is soft with minimal tenderness to deep palpation  but I did not appreciate a mass or any organomegaly.  EXTREMITIES:  No edema.   LABORATORY DATA:  Admission labs here:  Labs are pending.  Old hospital  chart at [facility] also unavailable at this time.  IMPRESSION:  [name] is a pleasant 77 year old gentleman  with a  relatively acute illness characterized by vague upper abdominal pain earlier  this week, followed by nausea, vomiting and coffee-grounds emesis and melena  by  history, with a significantly abnormally low hemoglobin.  He has remained  hemodynamically stable.  He has hiccups which he has experienced for the  past two days now.   He had an interesting reaction -- at least temporarily -- related to  initiation of a transfusion with packed red blood cells which may or may not  be transfusion-related phenomenon.  It is good to see that a spiral CT of  the chest demonstrated no evidence of pulmonary embolus.  He does have a  left superior mediastinal mass, which is probably related to the thyroid.   Plain films at [facility] earlier today demonstrated a picture  consistent with an ileus without free air or obstruction.   It does appear by history that patient has experienced an upper  gastrointestinal bleed.  I wonder if the ulcerated area seen in the distal  esophagus by Dr. Linna Darner represents a Mallory-Weiss tear, as the history is  certainly consistent with that possibility.  Such tears can produce  significant bleeding.   He has a low MCH, which implies a chronic component.  I really do not know  what his hemoglobin has been at baseline over the past year or so and I  would certainly like to review the records pertaining to his gallbladder  surgery here in early 2003.   Clinically, this gentleman has an ileus; the reasons for this are unclear.  He also has hiccups, which implies diaphragmatic irritation.  His  temperature at this point is also concerning but nonspecific.   I do not feel that we are dealing with mesenteric ischemia at this point in  time.  I do not detect an imminent surgical process.   RECOMMENDATIONS:  1. I agree with beginning IV antibiotics empirically for the time-being in     the way of Unasyn.  2. I will review baseline labs at [facility] when  they become available.  3. I will review the old medical records, once they become available.  4. He may well likely end up needing a transfusion with one to two units of     packed red blood cells, given the question of transfusion reaction     previously.  I would plan to premedicate him with Solu-Medrol and     Benadryl prior to any transfusion.  5. Will proceed at this point with an abdominal CT with oral and IV     contrast, just to make sure there is not a significant occult process     below the diaphragm contributing to his hiccups and ileus picture.   I will be following Mr. Cardenas closely along with Dr. Felecia Shelling while he is  here.  Further recommendations to follow.   I would like to thank Dr. Felecia Shelling for allowing me to see this nice gentleman  today.                                               Jonathon Bellows, M.D.    RMR/MEDQ  D:  05/15/2003  T:  05/16/2003  Job:  [id number]  cc:   Tesfaye D. Felecia Shelling, M.D.  [address]  [place]  [place] [address]  Fax: [phone]   [place], [place] Dr. Linna Darner

## 2011-01-20 NOTE — H&P (Signed)
[name], Hunter NO.:  [id number]   MEDICAL RECORD NO.:  [id number]          PATIENT TYPE:  INP   LOCATION:  A311                          FACILITY:  [facility]   PHYSICIAN:  Ky Barban, M.D.DATE OF BIRTH:  [birth year]   DATE OF ADMISSION:  10/15/2006  DATE OF DISCHARGE:  LH                              HISTORY & PHYSICAL   This patient is having surgery today about lunch time.   CHIEF COMPLAINT:  Acute urinary retention.   This 77 year old gentleman has BPH with bladder neck obstruction.  He  was worked up last year, and he does have BPH causing bladder neck  obstruction, was placed on Flomax, and he had initially very good  results.  He quit taking Flomax and never came back for followup.  Now,  he has gone into retention, so I advised him to undergo TUR prostate.  Procedures, limitations, complications discussed, and he is coming as an  outpatient to have it done, then will be admitted in the hospital.  Per  last history, he has hypertension, on medication and no history of  prostate cancer.  A couple of years of prostate biopsy done a couple of  times.  Both biopsies were negative, and initial biopsy had showed some  high-grade PIN, but subsequent biopsies next year was negative.   MEDICATIONS:  He was taking hydrochlorothiazide, diltiazem, Flomax,  Prevacid.   PAST MEDICAL HISTORY:  He also had cholecystectomy done several years  ago.   PERSONAL HISTORY:  Does not smoke or drink.   REVIEW OF SYSTEMS:  Unremarkable.   EXAMINATION:  VITAL SIGNS:  Blood pressure 120/80, temperature is  normal.  CENTRAL NERVOUS SYSTEM:  Negative.  HEAD, NECK, ENT:  Negative.  CHEST:  Symmetrical.  HEART:  Regular sinus rhythm.  No murmur.  ABDOMEN:  Soft, flat.  Liver, spleen, kidneys are not palpable.  No CVA  tenderness.  EXTERNAL GENITALIA:  Unremarkable.  Has Foley catheter in place.  RECTAL:  Therefore, the extremities are normal.   IMPRESSION:  1.  Benign prostatic hypertrophy with bladder neck obstruction.  2. Hypertension.   PLAN:  TUR prostate in the hospital and then admit him in the hospital.      Ky Barban, M.D.  Electronically Signed     MIJ/MEDQ  D:  10/15/2006  T:  10/15/2006  Job:  [id number]

## 2011-01-20 NOTE — Discharge Summary (Signed)
NAMECOBIN, CADAVID NO.:  [id number]   MEDICAL RECORD NO.:  [id number]          PATIENT TYPE:  INP   LOCATION:  A311                          FACILITY:  [facility]   PHYSICIAN:  Ky Barban, M.D.DATE OF BIRTH:  1934-08-20   DATE OF ADMISSION:  10/15/2006  DATE OF DISCHARGE:  02/13/2008LH                               DISCHARGE SUMMARY   FINAL DISCHARGE DIAGNOSIS:  Benign prostatic hypertrophy.   NOTE:  Final pathology report is back and shows that he has BPH, and I  will see him back in two weeks.  He is advised to continue his usual  medication.  He does not need to take Flomax anymore.  He will report to  the office in two weeks.   Mr. Dismore was discharged on February 13th.  This 77 year old  gentleman is well known to me.  Last year, he had symptoms of prostatism  and was worked up and found to have BPH with a bladder neck obstruction.  I have put him on Flomax with good result and then he quit taking the  Flomax and never came to see me and then he went into urinary retention  and workup revealed he continued BPH with a bladder neck obstruction.  I  advised him to undergo a TUR prostate.  He underwent preadmission  workups, CBC, urinalysis, had a SMA-7, which is normal, and he was taken  to the operating room and underwent TUR prostate on February 11th.  Postop course is benign.  First postop day, he is afebrile and urine is  clear so we put him on a regular diet and discontinued his CBI.  Second  postop day, urine is clear, had a bowel movement, took out the Foley  catheter, and he is voiding fine so he is being discharged home and will  be followed by me in the office.      Ky Barban, M.D.  Electronically Signed     MIJ/MEDQ  D:  11/15/2006  T:  11/16/2006  Job:  [id number]

## 2012-04-22 ENCOUNTER — Encounter (HOSPITAL_COMMUNITY): Payer: Self-pay | Admitting: Emergency Medicine

## 2012-04-22 ENCOUNTER — Inpatient Hospital Stay (HOSPITAL_COMMUNITY): Payer: Medicare PPO

## 2012-04-22 ENCOUNTER — Emergency Department (HOSPITAL_COMMUNITY): Payer: Medicare PPO

## 2012-04-22 ENCOUNTER — Inpatient Hospital Stay (HOSPITAL_COMMUNITY)
Admission: EM | Admit: 2012-04-22 | Discharge: 2012-05-07 | DRG: 356 | Disposition: A | Discharge status: Home Health | Payer: Medicare PPO | Attending: Internal Medicine | Admitting: Internal Medicine

## 2012-04-22 DIAGNOSIS — I82629 Acute embolism and thrombosis of deep veins of unspecified upper extremity: Secondary | ICD-10-CM | POA: Diagnosis present

## 2012-04-22 DIAGNOSIS — E669 Obesity, unspecified: Secondary | ICD-10-CM | POA: Diagnosis present

## 2012-04-22 DIAGNOSIS — J9600 Acute respiratory failure, unspecified whether with hypoxia or hypercapnia: Secondary | ICD-10-CM

## 2012-04-22 DIAGNOSIS — R7402 Elevation of levels of lactic acid dehydrogenase (LDH): Secondary | ICD-10-CM | POA: Diagnosis present

## 2012-04-22 DIAGNOSIS — N390 Urinary tract infection, site not specified: Secondary | ICD-10-CM | POA: Diagnosis not present

## 2012-04-22 DIAGNOSIS — R066 Hiccough: Secondary | ICD-10-CM | POA: Diagnosis not present

## 2012-04-22 DIAGNOSIS — E860 Dehydration: Secondary | ICD-10-CM | POA: Diagnosis present

## 2012-04-22 DIAGNOSIS — E039 Hypothyroidism, unspecified: Secondary | ICD-10-CM | POA: Diagnosis present

## 2012-04-22 DIAGNOSIS — R6521 Severe sepsis with septic shock: Secondary | ICD-10-CM | POA: Diagnosis not present

## 2012-04-22 DIAGNOSIS — D72829 Elevated white blood cell count, unspecified: Secondary | ICD-10-CM

## 2012-04-22 DIAGNOSIS — I10 Essential (primary) hypertension: Secondary | ICD-10-CM | POA: Diagnosis present

## 2012-04-22 DIAGNOSIS — J95821 Acute postprocedural respiratory failure: Secondary | ICD-10-CM | POA: Diagnosis not present

## 2012-04-22 DIAGNOSIS — Z9889 Other specified postprocedural states: Secondary | ICD-10-CM

## 2012-04-22 DIAGNOSIS — J9601 Acute respiratory failure with hypoxia: Secondary | ICD-10-CM | POA: Diagnosis present

## 2012-04-22 DIAGNOSIS — R079 Chest pain, unspecified: Secondary | ICD-10-CM

## 2012-04-22 DIAGNOSIS — A419 Sepsis, unspecified organism: Secondary | ICD-10-CM | POA: Diagnosis not present

## 2012-04-22 DIAGNOSIS — R5381 Other malaise: Secondary | ICD-10-CM | POA: Diagnosis not present

## 2012-04-22 DIAGNOSIS — J690 Pneumonitis due to inhalation of food and vomit: Secondary | ICD-10-CM | POA: Diagnosis not present

## 2012-04-22 DIAGNOSIS — Z6833 Body mass index (BMI) 33.0-33.9, adult: Secondary | ICD-10-CM

## 2012-04-22 DIAGNOSIS — N179 Acute kidney failure, unspecified: Secondary | ICD-10-CM | POA: Diagnosis present

## 2012-04-22 DIAGNOSIS — Z79899 Other long term (current) drug therapy: Secondary | ICD-10-CM

## 2012-04-22 DIAGNOSIS — R778 Other specified abnormalities of plasma proteins: Secondary | ICD-10-CM | POA: Diagnosis present

## 2012-04-22 DIAGNOSIS — E46 Unspecified protein-calorie malnutrition: Secondary | ICD-10-CM | POA: Diagnosis present

## 2012-04-22 DIAGNOSIS — Z87891 Personal history of nicotine dependence: Secondary | ICD-10-CM

## 2012-04-22 DIAGNOSIS — R509 Fever, unspecified: Secondary | ICD-10-CM | POA: Diagnosis present

## 2012-04-22 DIAGNOSIS — E876 Hypokalemia: Secondary | ICD-10-CM | POA: Diagnosis present

## 2012-04-22 DIAGNOSIS — K560 Paralytic ileus: Principal | ICD-10-CM | POA: Diagnosis present

## 2012-04-22 DIAGNOSIS — E873 Alkalosis: Secondary | ICD-10-CM | POA: Diagnosis present

## 2012-04-22 DIAGNOSIS — J189 Pneumonia, unspecified organism: Secondary | ICD-10-CM | POA: Diagnosis present

## 2012-04-22 DIAGNOSIS — K219 Gastro-esophageal reflux disease without esophagitis: Secondary | ICD-10-CM | POA: Diagnosis present

## 2012-04-22 DIAGNOSIS — R7401 Elevation of levels of liver transaminase levels: Secondary | ICD-10-CM | POA: Diagnosis present

## 2012-04-22 DIAGNOSIS — Z7982 Long term (current) use of aspirin: Secondary | ICD-10-CM

## 2012-04-22 HISTORY — DX: Esophagitis, unspecified without bleeding: K20.90

## 2012-04-22 HISTORY — DX: Esophagitis, unspecified: K20.9

## 2012-04-22 HISTORY — DX: Essential (primary) hypertension: I10

## 2012-04-22 HISTORY — DX: Ileus, unspecified: K56.7

## 2012-04-22 HISTORY — DX: Gastrointestinal hemorrhage, unspecified: K92.2

## 2012-04-22 HISTORY — DX: Gastro-esophageal laceration-hemorrhage syndrome: K22.6

## 2012-04-22 HISTORY — DX: Disorder of thyroid, unspecified: E07.9

## 2012-04-22 HISTORY — DX: Gastro-esophageal reflux disease without esophagitis: K21.9

## 2012-04-22 HISTORY — DX: Anemia, unspecified: D64.9

## 2012-04-22 HISTORY — DX: Personal history of other diseases of male genital organs: Z87.438

## 2012-04-22 HISTORY — DX: Benign prostatic hyperplasia without lower urinary tract symptoms: N40.0

## 2012-04-22 LAB — URINALYSIS, ROUTINE W REFLEX MICROSCOPIC
Bilirubin Urine: NEGATIVE
Glucose, UA: NEGATIVE mg/dL
Specific Gravity, Urine: 1.02 (ref 1.005–1.030)
Urobilinogen, UA: 1 mg/dL (ref 0.0–1.0)

## 2012-04-22 LAB — COMPREHENSIVE METABOLIC PANEL
AST: 18 U/L (ref 0–37)
Alkaline Phosphatase: 150 U/L — ABNORMAL HIGH (ref 39–117)
BUN: 21 mg/dL (ref 6–23)
CO2: 34 mEq/L — ABNORMAL HIGH (ref 19–32)
Chloride: 95 mEq/L — ABNORMAL LOW (ref 96–112)
Creatinine, Ser: 1.19 mg/dL (ref 0.50–1.35)
GFR calc non Af Amer: 58 mL/min — ABNORMAL LOW (ref >90)
Potassium: 3.1 mEq/L — ABNORMAL LOW (ref 3.5–5.1)
Total Bilirubin: 0.5 mg/dL (ref 0.3–1.2)

## 2012-04-22 LAB — LIPASE, BLOOD: Lipase: 19 U/L (ref 11–59)

## 2012-04-22 LAB — CBC
HCT: 43.5 % (ref 39.0–52.0)
MCV: 79.2 fL (ref 78.0–100.0)
RBC: 5.49 MIL/uL (ref 4.22–5.81)
WBC: 17.1 10*3/uL — ABNORMAL HIGH (ref 4.0–10.5)

## 2012-04-22 LAB — TROPONIN I: Troponin I: <0.3 ng/mL (ref <0.30)

## 2012-04-22 LAB — URINE MICROSCOPIC-ADD ON

## 2012-04-22 LAB — POCT I-STAT TROPONIN I: Troponin i, poc: 0 ng/mL (ref 0.00–0.08)

## 2012-04-22 MED ORDER — ONDANSETRON HCL 4 MG/2ML IJ SOLN
4.0000 mg | Freq: Four times a day (QID) | INTRAMUSCULAR | Status: DC | PRN
  Administered 2012-04-23 – 2012-04-30 (×3): 4 mg via INTRAVENOUS
  Filled 2012-04-22 (×3): qty 2

## 2012-04-22 MED ORDER — GI COCKTAIL ~~LOC~~
30.0000 mL | Freq: Once | ORAL | Status: AC
  Administered 2012-04-22: 30 mL via ORAL
  Filled 2012-04-22: qty 30

## 2012-04-22 MED ORDER — DILTIAZEM HCL ER COATED BEADS 180 MG PO CP24
180.0000 mg | ORAL_CAPSULE | Freq: Every day | ORAL | Status: DC
  Administered 2012-04-23: 180 mg via ORAL
  Filled 2012-04-22 (×3): qty 1

## 2012-04-22 MED ORDER — CALCIUM CARBONATE ANTACID 500 MG PO CHEW
1.0000 | CHEWABLE_TABLET | Freq: Every morning | ORAL | Status: DC
  Administered 2012-04-23: 200 mg via ORAL
  Filled 2012-04-22: qty 1

## 2012-04-22 MED ORDER — POTASSIUM CHLORIDE CRYS ER 20 MEQ PO TBCR
40.0000 meq | EXTENDED_RELEASE_TABLET | Freq: Once | ORAL | Status: AC
  Administered 2012-04-22: 40 meq via ORAL
  Filled 2012-04-22: qty 2

## 2012-04-22 MED ORDER — SODIUM CHLORIDE 0.9 % IV BOLUS (SEPSIS)
500.0000 mL | Freq: Once | INTRAVENOUS | Status: AC
  Administered 2012-04-22: 500 mL via INTRAVENOUS

## 2012-04-22 MED ORDER — POTASSIUM CHLORIDE CRYS ER 20 MEQ PO TBCR
40.0000 meq | EXTENDED_RELEASE_TABLET | Freq: Two times a day (BID) | ORAL | Status: DC
  Administered 2012-04-22 – 2012-04-23 (×3): 40 meq via ORAL
  Filled 2012-04-22 (×3): qty 2

## 2012-04-22 MED ORDER — TAMSULOSIN HCL 0.4 MG PO CAPS
0.4000 mg | ORAL_CAPSULE | Freq: Every day | ORAL | Status: DC
  Administered 2012-04-22: 0.4 mg via ORAL
  Filled 2012-04-22: qty 1

## 2012-04-22 MED ORDER — PANTOPRAZOLE SODIUM 40 MG IV SOLR
40.0000 mg | INTRAVENOUS | Status: DC
  Administered 2012-04-22: 40 mg via INTRAVENOUS
  Filled 2012-04-22: qty 40

## 2012-04-22 MED ORDER — SODIUM CHLORIDE 0.9 % IV SOLN
INTRAVENOUS | Status: DC
  Administered 2012-04-22: 18:00:00 via INTRAVENOUS

## 2012-04-22 MED ORDER — ASPIRIN EC 81 MG PO TBEC
81.0000 mg | DELAYED_RELEASE_TABLET | Freq: Every day | ORAL | Status: DC
  Administered 2012-04-23: 81 mg via ORAL
  Filled 2012-04-22: qty 1

## 2012-04-22 MED ORDER — ALUM & MAG HYDROXIDE-SIMETH 200-200-20 MG/5ML PO SUSP
30.0000 mL | Freq: Four times a day (QID) | ORAL | Status: DC | PRN
  Administered 2012-04-22: 30 mL via ORAL
  Filled 2012-04-22: qty 30

## 2012-04-22 MED ORDER — ENOXAPARIN SODIUM 40 MG/0.4ML ~~LOC~~ SOLN
40.0000 mg | SUBCUTANEOUS | Status: DC
  Administered 2012-04-22 – 2012-04-26 (×5): 40 mg via SUBCUTANEOUS
  Filled 2012-04-22 (×6): qty 0.4

## 2012-04-22 MED ORDER — LEVOTHYROXINE SODIUM 25 MCG PO TABS
125.0000 ug | ORAL_TABLET | Freq: Every day | ORAL | Status: DC
  Administered 2012-04-23: 125 ug via ORAL
  Filled 2012-04-22: qty 1

## 2012-04-22 MED ORDER — PANTOPRAZOLE SODIUM 40 MG IV SOLR
40.0000 mg | Freq: Once | INTRAVENOUS | Status: AC
  Administered 2012-04-22: 40 mg via INTRAVENOUS
  Filled 2012-04-22: qty 40

## 2012-04-22 NOTE — ED Notes (Signed)
Offered patient to go to the restroom to obtain urine sample. Patient unable to void at this time.
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
## 2012-04-22 NOTE — ED Notes (Signed)
Patient c/o non-radiating mid-sternal chest pain since yesterday. Per patient feels like indigestion. Patient took tums this morning with no relief. No cardiac hx per patient. Reports taking 1 baby aspirin this morning. Reports dizziness and shortness of breath.

## 2012-04-22 NOTE — Progress Notes (Signed)
ANTICOAGULATION CONSULT NOTE - Initial Consult  Pharmacy Consult for Lovenox  Indication: VTE prophylaxis  No Known Allergies  Patient Measurements: Height: 5' (152.4 cm) Weight: 180 lb (81.647 kg) IBW/kg (Calculated) : 50    Vital Signs: Temp: 98.9 F (37.2 C) (08/19 1730) Temp src: Oral (08/19 1730) BP: 175/85 mmHg (08/19 1730) Pulse Rate: 81  (08/19 1730)  Labs:  Basename 04/22/12 1319 04/22/12 0859  HGB -- 14.5  HCT -- 43.5  PLT -- 223  APTT -- --  LABPROT -- --  INR -- --  HEPARINUNFRC -- --  CREATININE -- 1.19  CKTOTAL -- --  CKMB -- --  TROPONINI <0.30 <0.30    Estimated Creatinine Clearance: 46.8 ml/min (by C-G formula based on Cr of 1.19).   Medical History: Past Medical History  Diagnosis Date  . Hypertension   . GERD (gastroesophageal reflux disease)   . Thyroid disease     Medications:  Scheduled:    . aspirin EC  81 mg Oral Daily  . calcium carbonate  1 tablet Oral q morning - 10a  . diltiazem  180 mg Oral Daily  . enoxaparin (LOVENOX) injection  40 mg Subcutaneous Q24H  . gi cocktail  30 mL Oral Once  . levothyroxine  125 mcg Oral Daily  . pantoprazole  40 mg Intravenous Once  . pantoprazole (PROTONIX) IV  40 mg Intravenous Q24H  . potassium chloride SA  40 mEq Oral Once  . potassium chloride  40 mEq Oral BID  . sodium chloride  500 mL Intravenous Once  . Tamsulosin HCl  0.4 mg Oral QPC supper    Assessment: 78 yo male to be started on lovenox for VTE prophylaxis. CBC and renal function within desired ranges.  Goal of Therapy:  Monitor platelets by anticoagulation protocol: Yes   Plan:  Lovenox 40 mg SQ Q24 hr Monitor renal function and CBC  Natasha Bence 04/22/2012,10:20 PM

## 2012-04-23 ENCOUNTER — Inpatient Hospital Stay (HOSPITAL_COMMUNITY): Payer: Medicare PPO

## 2012-04-23 ENCOUNTER — Encounter (HOSPITAL_COMMUNITY): Payer: Self-pay | Admitting: Urgent Care

## 2012-04-23 DIAGNOSIS — K56609 Unspecified intestinal obstruction, unspecified as to partial versus complete obstruction: Secondary | ICD-10-CM

## 2012-04-23 LAB — CBC
HCT: 40.3 % (ref 39.0–52.0)
MCHC: 32.8 g/dL (ref 30.0–36.0)
MCV: 78.6 fL (ref 78.0–100.0)
RDW: 15.8 % — ABNORMAL HIGH (ref 11.5–15.5)

## 2012-04-23 LAB — BASIC METABOLIC PANEL
BUN: 21 mg/dL (ref 6–23)
CO2: 26 mEq/L (ref 19–32)
Calcium: 9.4 mg/dL (ref 8.4–10.5)
Chloride: 100 mEq/L (ref 96–112)
Creatinine, Ser: 1.05 mg/dL (ref 0.50–1.35)

## 2012-04-23 MED ORDER — POTASSIUM CHLORIDE 10 MEQ/100ML IV SOLN
10.0000 meq | INTRAVENOUS | Status: AC
  Administered 2012-04-23 (×3): 10 meq via INTRAVENOUS
  Filled 2012-04-23 (×3): qty 100

## 2012-04-23 MED ORDER — ZOLPIDEM TARTRATE 5 MG PO TABS
5.0000 mg | ORAL_TABLET | Freq: Every evening | ORAL | Status: DC | PRN
  Administered 2012-04-23 – 2012-04-25 (×2): 5 mg via ORAL
  Filled 2012-04-23 (×3): qty 1

## 2012-04-23 MED ORDER — IOHEXOL 300 MG/ML  SOLN
100.0000 mL | Freq: Once | INTRAMUSCULAR | Status: AC | PRN
  Administered 2012-04-23: 100 mL via INTRAVENOUS

## 2012-04-23 MED ORDER — PANTOPRAZOLE SODIUM 40 MG IV SOLR
40.0000 mg | Freq: Every day | INTRAVENOUS | Status: DC
  Administered 2012-04-24 – 2012-05-02 (×8): 40 mg via INTRAVENOUS
  Filled 2012-04-23 (×12): qty 40

## 2012-04-23 NOTE — Consult Note (Signed)
WILL FOLLOW PERIPHERALLY. CALL WITH QUESTIONS.  REVIEWED. AGREE.
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
## 2012-04-23 NOTE — Consult Note (Signed)
Referring Provider: Dr. Felecia Shelling Primary Care Physician:  Avon Gully, MD Primary Gastroenterologist:  Dr. Jena Gauss   Date of Admission: 04/22/12 Date of Consultation: 04/23/12  Reason for Consultation:  Epigastric and Substernal Pain  HPI:  Victor Hunter is a 78 year old male admitted with intermittent chest pain, nausea, diaphoresis, loose stools. 1 loose stool today and yesterday. CT abd/pelvis showed dilated loops of small bowel proximally, the distal small bowel decompressed to the terminal ileum. Point of caliber change in anterior left lower abdomen without evidence of mass, ?due to adhesions. Full CT report below.  Pt states thought he was having chest pain. Started Sunday, points to left-side chest. Sharp. Intermittent. N/V. Denies hematemesis. +hiccups. Tums for heartburn since Sat/Sun. States was taking a "belly pill", prescribed by Dr. Felecia Shelling with good control of reflux prior to this. No melena, hematochezia. Denies loss of appetite or wt loss.  BM every 2-3 days. Laxative daily per wife. Notes rare flatus. No abdominal pain. Patient not very talkative at this time. Difficult to obtain complete report. Appears has hx of ileus in remote past. Last TCS in 2005 by Dr. Jena Gauss as below.   Past Medical History  Diagnosis Date  . Hypertension   . GERD (gastroesophageal reflux disease)   . Thyroid disease   . Upper GI bleed   . Ileus   . Mallory - Weiss tear   . Chronic anemia   . Enlarged prostate     Elevated PSA  . 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 Opal Sidles benign biopsies February 2005  . History of BPH     s/p TURP 2008    Past Surgical History  Procedure Date  . Thyroidectomy   . Cholecystectomy   . Esophagogastroduodenoscopy 08/31/08    Rourk-possible extrinsic impression on midesophagus, noncritical Schatzki's ring, 3 mm fundal gland polyp, multiple gastric fundic polyps not manipulated, small hiatal  hernia  . Colonoscopy 10/29/03    rourk-inflammatory polyp from 40 cm removed  . Knee arthroscopy     pt denies  . Transurethral resection of prostate     pt denies, but this is listed in his past medical records.     Prior to Admission medications   Medication Sig Start Date End Date Taking? Authorizing Provider  aspirin EC 81 MG tablet Take 81 mg by mouth daily.   Yes Historical Provider, MD  calcium carbonate (TUMS - DOSED IN MG ELEMENTAL CALCIUM) 500 MG chewable tablet Chew 1 tablet by mouth daily as needed. Acid Reflux   Yes Historical Provider, MD  diltiazem (DILACOR XR) 180 MG 24 hr capsule Take 180 mg by mouth daily.   Yes Historical Provider, MD  hydrochlorothiazide (HYDRODIURIL) 25 MG tablet Take 25 mg by mouth daily.   Yes Historical Provider, MD  levothyroxine (SYNTHROID, LEVOTHROID) 125 MCG tablet Take 125 mcg by mouth daily.   Yes Historical Provider, MD  potassium chloride SA (K-DUR,KLOR-CON) 20 MEQ tablet Take 20 mEq by mouth daily.   Yes Historical Provider, MD  Tamsulosin HCl (FLOMAX) 0.4 MG CAPS Take 0.4 mg by mouth daily after supper.   Yes Historical Provider, MD    Current Facility-Administered Medications  Medication Dose Route Frequency Provider Last Rate Last Dose  . 0.9 %  sodium chloride infusion   Intravenous Continuous Avon Gully, MD 100 mL/hr at 04/22/12 1919    . alum & mag hydroxide-simeth (MAALOX/MYLANTA) 200-200-20 MG/5ML suspension 30 mL  30 mL Oral Q6H  PRN Avon Gully, MD   30 mL at 04/22/12 1702  . aspirin EC tablet 81 mg  81 mg Oral Daily Avon Gully, MD   81 mg at 04/23/12 1244  . calcium carbonate (TUMS - dosed in mg elemental calcium) chewable tablet 200 mg of elemental calcium  1 tablet Oral q morning - 10a Tesfaye Fanta, MD   200 mg of elemental calcium at 04/23/12 1244  . diltiazem (CARDIZEM CD) 24 hr capsule 180 mg  180 mg Oral Daily Avon Gully, MD   180 mg at 04/23/12 1244  . enoxaparin (LOVENOX) injection 40 mg  40 mg Subcutaneous  Q24H Avon Gully, MD   40 mg at 04/22/12 2202  . iohexol (OMNIPAQUE) 300 MG/ML solution 100 mL  100 mL Intravenous Once PRN Medication Radiologist, MD   100 mL at 04/23/12 1123  . levothyroxine (SYNTHROID, LEVOTHROID) tablet 125 mcg  125 mcg Oral Daily Avon Gully, MD   125 mcg at 04/23/12 1244  . ondansetron (ZOFRAN) injection 4 mg  4 mg Intravenous Q6H PRN Alice Reichert, MD   4 mg at 04/23/12 1038  . pantoprazole (PROTONIX) injection 40 mg  40 mg Intravenous QAC breakfast Sandi L Fields, MD      . potassium chloride 10 mEq in 100 mL IVPB  10 mEq Intravenous Q1 Hr x 3 Tesfaye Fanta, MD   10 mEq at 04/23/12 1055  . potassium chloride SA (K-DUR,KLOR-CON) CR tablet 40 mEq  40 mEq Oral BID Avon Gully, MD   40 mEq at 04/23/12 1243  . Tamsulosin HCl (FLOMAX) capsule 0.4 mg  0.4 mg Oral QPC supper Avon Gully, MD   0.4 mg at 04/22/12 2041  . DISCONTD: pantoprazole (PROTONIX) injection 40 mg  40 mg Intravenous Q24H Avon Gully, MD   40 mg at 04/22/12 2041    Allergies as of 04/22/2012  . (No Known Allergies)    Family History  Problem Relation Age of Onset  . Cancer Other   . Heart failure Other   . Liver disease Sister   . Breast cancer Sister   . Hypertension Mother   . Stroke Father   . Colon cancer Neg Hx     History   Social History  . Marital Status: Married    Spouse Name: N/A    Number of Children: 4  . Years of Education: N/A   Occupational History  . retired [employer] architect    Social History Main Topics  . Smoking status: Former Smoker -- 0.5 packs/day    Types: Cigarettes  . Smokeless tobacco: Never Used  . Alcohol Use: No  . Drug Use: No  . Sexually Active: No   Other Topics Concern  . Not on file   Social History Narrative  . No narrative on file    Review of Systems: Gen: Denies fever, chills, loss of appetite, change in weight or weight loss CV: Denies chest pain, heart palpitations, syncope, edema  Resp: Denies shortness of breath with rest,  cough, wheezing GI: Denies dysphagia or odynophagia. Denies vomiting blood, jaundice, and fecal incontinence.  GU : Denies urinary burning, urinary frequency, urinary incontinence.  MS: Denies joint pain,swelling, cramping Derm: Denies rash, itching, dry skin Psych: Denies depression, anxiety,confusion, or memory loss Heme: Denies bruising, bleeding, and enlarged lymph nodes.  Physical Exam: Vital signs in last 24 hours: Temp:  [98.7 F (37.1 C)-99.1 F (37.3 C)] 99.1 F (37.3 C) (08/20 1500) Pulse Rate:  [81-86] 82  (08/20 1500) Resp:  [  18-22] 18  (08/20 1500) BP: (143-175)/(74-88) 158/88 mmHg (08/20 1500) SpO2:  [94 %-96 %] 94 % (08/20 1500) Weight:  [180 lb (81.647 kg)] 180 lb (81.647 kg) (08/19 1730) Last BM Date: 04/22/12 General:   Alert,  Resting in bed with eyes closed.  Head:  Normocephalic and atraumatic. Eyes:  Sclera clear, no icterus.   Conjunctiva pink. Ears:  Normal auditory acuity. Nose:  No deformity, discharge,  or lesions. NG tube left nare to LWS, bilious drainage, 200cc.  Mouth:  No deformity or lesions, dentition normal. Neck:  Supple; no masses or thyromegaly. Lungs:  Expiratory wheezes bilaterally Heart:  Regular rate and rhythm; no murmurs, clicks, rubs,  or gallops. Abdomen:  Hypoactive BS, distended but soft, no HSM or hernia noted.   Rectal:  Deferred  Msk:  Symmetrical without gross deformities. Normal posture. Pulses:  Normal pulses noted. Extremities:  Without clubbing or edema. Neurologic:  Alert and  oriented x4;  grossly normal neurologically. Skin:  Intact without significant lesions or rashes. Cervical Nodes:  No significant cervical adenopathy. Psych:  Alert and cooperative. Normal mood and affect.  Intake/Output from previous day: 08/19 0701 - 08/20 0700 In: 120 [P.O.:120] Out: -  Intake/Output this shift: Total I/O In: 2464.2 [I.V.:2164.2; IV Piggyback:300] Out: 300 [Emesis/NG output:300]  Lab Results:  Basename 04/23/12 0507  04/22/12 0859  WBC 11.1* 17.1*  HGB 13.2 14.5  HCT 40.3 43.5  PLT 190 223   BMET  Basename 04/23/12 0507 04/22/12 0859  NA 138 140  K 3.1* 3.1*  CL 100 95*  CO2 26 34*  GLUCOSE 174* 183*  BUN 21 21  CREATININE 1.05 1.19  CALCIUM 9.4 11.1*   LFT  Basename 04/22/12 0859  PROT 8.6*  ALBUMIN 4.2  AST 18  ALT 15  ALKPHOS 150*  BILITOT 0.5  BILIDIR --  IBILI --    Studies/Results: Dg Chest 2 View  04/22/2012  *RADIOLOGY REPORT*  Clinical Data: Chest pain, cold sweats, hypertension  CHEST - 2 VIEW  Comparison: 11/13/2008  Findings: Biconvex thoracolumbar scoliosis. Borderline enlargement of cardiac silhouette. Mediastinal contours and pulmonary vascularity normal. No definite infiltrate, pleural effusion, or pneumothorax. Question prior thyroidectomy. Mild peribronchial thickening. Surgical clips right upper quadrant question cholecystectomy.  IMPRESSION: Borderline enlargement of cardiac silhouette. Mild bronchitic changes.   Original Report Authenticated By: Lollie Marrow, M.D. ( 04/22/2012 10:02:45 )    Dg Abd 1 View  04/22/2012  *RADIOLOGY REPORT*  Clinical Data: Nausea, vomiting, abdominal pain and distension since yesterday.  ABDOMEN - 1 VIEW  Comparison: PET CT 09/16/2008.  CT abdomen and pelvis 09/09/2008.  Findings: Prominent gaseous distension of mid abdominal small bowel with nondistended partially stool filled colon.  Changes are consistent with small bowel obstruction.  No radiopaque stones. Surgical clips in the right upper quadrant.  Lumbar scoliosis with mild degenerative change.  IMPRESSION: Gas distended mid abdominal small bowel loops consistent with obstruction.   Original Report Authenticated By: Marlon Pel, M.D.    Ct Abdomen Pelvis W Contrast  04/23/2012  *RADIOLOGY REPORT*  Clinical Data: Bowel obstruction by plain film  CT ABDOMEN AND PELVIS WITH CONTRAST  Technique:  Multidetector CT imaging of the abdomen and pelvis was performed following the  standard protocol during bolus administration of intravenous contrast.  Contrast: OMNIPAQUE IOHEXOL 300 MG/ML  SOLN  Comparison: Abdomen films of 04/22/2012 and CT abdomen pelvis of 09/09/2008  Findings: There is parenchymal opacity within the left lower lobe consistent with pneumonia.  Also  is minimal bronchiectatic change in the left lower lobe.  The right lung base is clear.  The liver enhances with no focal abnormality and no ductal dilatation is seen.  Surgical clips are present from prior cholecystectomy.  The pancreas is normal in size and the pancreatic duct is not dilated. The adrenal glands and spleen are unremarkable with a small splenule present.  The kidneys enhance with no calculus or mass and no hydronephrosis is seen.  On delayed images, the pelvocaliceal systems are unremarkable.  The abdominal aorta is normal in caliber.  There are dilated loops of small bowel proximally.  The distal small bowel is decompressed to the terminal ileum.  There does appear to be of a point of caliber change within the anterior left lower abdomen without evidence of mass.  This most likely is due to an adhesion.  There is a small amount of free fluid within the right lower quadrant and pelvis.  The urinary bladder is not well opacified, but the prostate appears to be significantly enlarged and very inhomogeneous.  Invasion of the posterior inferior aspect of urinary bladder cannot be excluded on this exam.  Delayed images may be helpful to assess the urinary bladder more detail.  No abnormality of the colon is seen.  The terminal ileum is unremarkable.  Degenerative changes are present throughout the facet joints of the lower lumbar spine. The patient was brought back for delayed images through the pelvis for better filling of the urinary bladder.  Again the prostate is noted to be significantly enlarged and irregular worrisome for prostate carcinoma.  The enlarged prostate does indent the urinary bladder and  there is some irregularity to the posterior bladder wall, making invasion of tumor a possibility.  IMPRESSION:  1.  Partial small bowel obstruction with apparent caliber change within the anterior left lower abdomen most likely due to adhesion. No mass is seen. 2.  Small amount of free fluid layering within the right lower quadrant and pelvis. 3.  Very irregular inhomogeneous and enlarged prostate worrisome for prostate carcinoma.  Invasion of urinary bladder cannot be excluded. 3.  Left lower lobe parenchymal opacity most consistent with pneumonia.   Original Report Authenticated By: Juline Patch, M.D.     Impression: 78 year old male with partial small bowel obstruction, query secondary to adhesions. Pt is somewhat vague on past surgical history, stating he had not had prostate surgery (which is documented in medical records). Pt denied abdominal pain during my discussion with him. Noted hiccups and heartburn at onset of chest pain, with need for Tums. Looking back at old records, pt has had hiccups in past as well. Last EGD performed 2009 as above. Last TCS in 2005 with inflammatory polyp. One loose stool today, likely secondary to current acute process. Would recommend continued supportive measures as already ordered, agree with NG tube. Pt has had approximately 200cc of dark, bilious drainage with placement of tube and notable relief. Agree with PPI, NPO status. Consider further GI evaluation as outpatient to further explore lower/upper GI tract if indicated. Urology consult may be beneficial due to CT findings. As separate note, mild elevation of alk phos likely not clinically significant. Leukocytosis improving.   Plan: NPO Supportive measures IVFs Recommend urology consult PPI as ordered Continue to follow with you inpatient and proceed with further evaluation as needed.    LOS: 1 day   Gerrit Halls  04/23/2012, 3:52 PM

## 2012-04-23 NOTE — Consult Note (Signed)
Reason for Consult: Small bowel obstruction Referring Physician: Dr. Lacey Jensen is an 78 y.o. male.  HPI: Patient presents to [facility] several-day history of increasing nausea vomiting and diffuse abdominal pain. He denies any similar symptomatology in the past. He has had associated fevers and chills. He has had associated diaphoresis. His last normal bowel movement was approximately week ago although he has had several episodes of diarrhea in the interim. He denies any melena or hematochezia. He did state he passed flatus and diarrhea yesterday. He has not had any significant bowel function today. He's had persistent nausea worse with eating. He has had several episodes of apparently nonbloody emesis. It is described as dark in color. He has had a significant odor. He has had previous colonoscopy which she states demonstrated polyps. He has no significant family history of any bowel disease or cancers. No significant history of an intra-abdominal or pelvic surgeries per patient although it is documented the patient has had a cholecystectomy. Additionally it is reported that the patient has had bladder surgery and by my understanding this is a transurethral prostatectomy. There is no evidence of any true bladder surgery in the patient's notes or from what I can ascertain from the patient or family. They are not clear however of the actual extent of pelvic or prostate surgery he has had. Patient's remains significantly nauseated despite antiemetics being administered.  Past Medical History  Diagnosis Date  . Hypertension   . GERD (gastroesophageal reflux disease)   . Thyroid disease   . Upper GI bleed   . Ileus   . Mallory - Weiss tear   . Chronic anemia   . Enlarged prostate     Elevated PSA  . 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 Opal Sidles benign biopsies February 2005    Past Surgical History    Procedure Date  . Thyroidectomy   . Cholecystectomy   . Esophagogastroduodenoscopy 08/31/08    Rourk-possible extrinsic impression on midesophagus, noncritical Schatzki's ring, 3 mm fundal gland polyp, multiple gastric fundic polyps not manipulated, small hiatal hernia  . Colonoscopy 10/29/03    Work-inflammatory polyp from 40 cm removed  . Knee arthroscopy   . Bladder surgery     Family History  Problem Relation Age of Onset  . Cancer Other   . Heart failure Other   . Liver disease Sister   . Breast cancer Sister   . Hypertension Mother   . Stroke Father     Social History:  reports that he has quit smoking. His smoking use included Cigarettes. He smoked .5 packs per day. He has never used smokeless tobacco. He reports that he does not drink alcohol or use illicit drugs.  Allergies: No Known Allergies  Medications:  I have reviewed the patient's current medications. Prior to Admission:  Prescriptions prior to admission  Medication Sig Dispense Refill  . aspirin EC 81 MG tablet Take 81 mg by mouth daily.      . calcium carbonate (TUMS - DOSED IN MG ELEMENTAL CALCIUM) 500 MG chewable tablet Chew 1 tablet by mouth daily as needed. Acid Reflux      . diltiazem (DILACOR XR) 180 MG 24 hr capsule Take 180 mg by mouth daily.      . hydrochlorothiazide (HYDRODIURIL) 25 MG tablet Take 25 mg by mouth daily.      Marland Kitchen levothyroxine (SYNTHROID, LEVOTHROID) 125 MCG tablet Take 125  mcg by mouth daily.      . potassium chloride SA (K-DUR,KLOR-CON) 20 MEQ tablet Take 20 mEq by mouth daily.      . Tamsulosin HCl (FLOMAX) 0.4 MG CAPS Take 0.4 mg by mouth daily after supper.       Scheduled:   . aspirin EC  81 mg Oral Daily  . calcium carbonate  1 tablet Oral q morning - 10a  . diltiazem  180 mg Oral Daily  . enoxaparin (LOVENOX) injection  40 mg Subcutaneous Q24H  . levothyroxine  125 mcg Oral Daily  . pantoprazole (PROTONIX) IV  40 mg Intravenous Q24H  . potassium chloride  10 mEq  Intravenous Q1 Hr x 3  . potassium chloride  40 mEq Oral BID  . Tamsulosin HCl  0.4 mg Oral QPC supper   Continuous:   . sodium chloride 100 mL/hr at 04/22/12 1919   ZOX:WRUE & mag hydroxide-simeth, iohexol, ondansetron  Results for orders placed during the hospital encounter of 04/22/12 (from the past 48 hour(s))  POCT I-STAT TROPONIN I     Status: Normal   Collection Time   04/22/12  8:53 AM      Component Value Range Comment   Troponin i, poc 0.00  0.00 - 0.08 ng/mL    Comment 3            CBC     Status: Abnormal   Collection Time   04/22/12  8:59 AM      Component Value Range Comment   WBC 17.1 (*) 4.0 - 10.5 K/uL    RBC 5.49  4.22 - 5.81 MIL/uL    Hemoglobin 14.5  13.0 - 17.0 g/dL    HCT 45.4  09.8 - 11.9 %    MCV 79.2  78.0 - 100.0 fL    MCH 26.4  26.0 - 34.0 pg    MCHC 33.3  30.0 - 36.0 g/dL    RDW 14.7 (*) 82.9 - 15.5 %    Platelets 223  150 - 400 K/uL   COMPREHENSIVE METABOLIC PANEL     Status: Abnormal   Collection Time   04/22/12  8:59 AM      Component Value Range Comment   Sodium 140  135 - 145 mEq/L    Potassium 3.1 (*) 3.5 - 5.1 mEq/L    Chloride 95 (*) 96 - 112 mEq/L    CO2 34 (*) 19 - 32 mEq/L    Glucose, Bld 183 (*) 70 - 99 mg/dL    BUN 21  6 - 23 mg/dL    Creatinine, Ser 5.62  0.50 - 1.35 mg/dL    Calcium 13.0 (*) 8.4 - 10.5 mg/dL    Total Protein 8.6 (*) 6.0 - 8.3 g/dL    Albumin 4.2  3.5 - 5.2 g/dL    AST 18  0 - 37 U/L    ALT 15  0 - 53 U/L    Alkaline Phosphatase 150 (*) 39 - 117 U/L    Total Bilirubin 0.5  0.3 - 1.2 mg/dL    GFR calc non Af Amer 58 (*) >90 mL/min    GFR calc Af Amer 67 (*) >90 mL/min   TROPONIN I     Status: Normal   Collection Time   04/22/12  8:59 AM      Component Value Range Comment   Troponin I <0.30  <0.30 ng/mL   LIPASE, BLOOD     Status: Normal   Collection Time   04/22/12  8:59 AM      Component Value Range Comment   Lipase 19  11 - 59 U/L   URINALYSIS, ROUTINE W REFLEX MICROSCOPIC     Status: Abnormal    Collection Time   04/22/12 11:43 AM      Component Value Range Comment   Color, Urine YELLOW  YELLOW    APPearance CLEAR  CLEAR    Specific Gravity, Urine 1.020  1.005 - 1.030    pH 8.5 (*) 5.0 - 8.0    Glucose, UA NEGATIVE  NEGATIVE mg/dL    Hgb urine dipstick TRACE (*) NEGATIVE    Bilirubin Urine NEGATIVE  NEGATIVE    Ketones, ur 15 (*) NEGATIVE mg/dL    Protein, ur 30 (*) NEGATIVE mg/dL    Urobilinogen, UA 1.0  0.0 - 1.0 mg/dL    Nitrite NEGATIVE  NEGATIVE    Leukocytes, UA NEGATIVE  NEGATIVE   URINE MICROSCOPIC-ADD ON     Status: Normal   Collection Time   04/22/12 11:43 AM      Component Value Range Comment   RBC / HPF 0-2  <3 RBC/hpf   TROPONIN I     Status: Normal   Collection Time   04/22/12  1:19 PM      Component Value Range Comment   Troponin I <0.30  <0.30 ng/mL   BASIC METABOLIC PANEL     Status: Abnormal   Collection Time   04/23/12  5:07 AM      Component Value Range Comment   Sodium 138  135 - 145 mEq/L    Potassium 3.1 (*) 3.5 - 5.1 mEq/L    Chloride 100  96 - 112 mEq/L    CO2 26  19 - 32 mEq/L    Glucose, Bld 174 (*) 70 - 99 mg/dL    BUN 21  6 - 23 mg/dL    Creatinine, Ser 1.61  0.50 - 1.35 mg/dL    Calcium 9.4  8.4 - 09.6 mg/dL    GFR calc non Af Amer 67 (*) >90 mL/min    GFR calc Af Amer 78 (*) >90 mL/min   CBC     Status: Abnormal   Collection Time   04/23/12  5:07 AM      Component Value Range Comment   WBC 11.1 (*) 4.0 - 10.5 K/uL    RBC 5.13  4.22 - 5.81 MIL/uL    Hemoglobin 13.2  13.0 - 17.0 g/dL    HCT 04.5  40.9 - 81.1 %    MCV 78.6  78.0 - 100.0 fL    MCH 25.7 (*) 26.0 - 34.0 pg    MCHC 32.8  30.0 - 36.0 g/dL    RDW 91.4 (*) 78.2 - 15.5 %    Platelets 190  150 - 400 K/uL     Dg Chest 2 View  04/22/2012  *RADIOLOGY REPORT*  Clinical Data: Chest pain, cold sweats, hypertension  CHEST - 2 VIEW  Comparison: 11/13/2008  Findings: Biconvex thoracolumbar scoliosis. Borderline enlargement of cardiac silhouette. Mediastinal contours and pulmonary  vascularity normal. No definite infiltrate, pleural effusion, or pneumothorax. Question prior thyroidectomy. Mild peribronchial thickening. Surgical clips right upper quadrant question cholecystectomy.  IMPRESSION: Borderline enlargement of cardiac silhouette. Mild bronchitic changes.   Original Report Authenticated By: Lollie Marrow, M.D. ( 04/22/2012 10:02:45 )    Dg Abd 1 View  04/22/2012  *RADIOLOGY REPORT*  Clinical Data: Nausea, vomiting, abdominal pain and distension since yesterday.  ABDOMEN - 1 VIEW  Comparison: PET CT 09/16/2008.  CT abdomen and pelvis 09/09/2008.  Findings: Prominent gaseous distension of mid abdominal small bowel with nondistended partially stool filled colon.  Changes are consistent with small bowel obstruction.  No radiopaque stones. Surgical clips in the right upper quadrant.  Lumbar scoliosis with mild degenerative change.  IMPRESSION: Gas distended mid abdominal small bowel loops consistent with obstruction.   Original Report Authenticated By: Marlon Pel, M.D.    Ct Abdomen Pelvis W Contrast  04/23/2012  *RADIOLOGY REPORT*  Clinical Data: Bowel obstruction by plain film  CT ABDOMEN AND PELVIS WITH CONTRAST  Technique:  Multidetector CT imaging of the abdomen and pelvis was performed following the standard protocol during bolus administration of intravenous contrast.  Contrast: OMNIPAQUE IOHEXOL 300 MG/ML  SOLN  Comparison: Abdomen films of 04/22/2012 and CT abdomen pelvis of 09/09/2008  Findings: There is parenchymal opacity within the left lower lobe consistent with pneumonia.  Also is minimal bronchiectatic change in the left lower lobe.  The right lung base is clear.  The liver enhances with no focal abnormality and no ductal dilatation is seen.  Surgical clips are present from prior cholecystectomy.  The pancreas is normal in size and the pancreatic duct is not dilated. The adrenal glands and spleen are unremarkable with a small splenule present.  The kidneys  enhance with no calculus or mass and no hydronephrosis is seen.  On delayed images, the pelvocaliceal systems are unremarkable.  The abdominal aorta is normal in caliber.  There are dilated loops of small bowel proximally.  The distal small bowel is decompressed to the terminal ileum.  There does appear to be of a point of caliber change within the anterior left lower abdomen without evidence of mass.  This most likely is due to an adhesion.  There is a small amount of free fluid within the right lower quadrant and pelvis.  The urinary bladder is not well opacified, but the prostate appears to be significantly enlarged and very inhomogeneous.  Invasion of the posterior inferior aspect of urinary bladder cannot be excluded on this exam.  Delayed images may be helpful to assess the urinary bladder more detail.  No abnormality of the colon is seen.  The terminal ileum is unremarkable.  Degenerative changes are present throughout the facet joints of the lower lumbar spine. The patient was brought back for delayed images through the pelvis for better filling of the urinary bladder.  Again the prostate is noted to be significantly enlarged and irregular worrisome for prostate carcinoma.  The enlarged prostate does indent the urinary bladder and there is some irregularity to the posterior bladder wall, making invasion of tumor a possibility.  IMPRESSION:  1.  Partial small bowel obstruction with apparent caliber change within the anterior left lower abdomen most likely due to adhesion. No mass is seen. 2.  Small amount of free fluid layering within the right lower quadrant and pelvis. 3.  Very irregular inhomogeneous and enlarged prostate worrisome for prostate carcinoma.  Invasion of urinary bladder cannot be excluded. 3.  Left lower lobe parenchymal opacity most consistent with pneumonia.   Original Report Authenticated By: Juline Patch, M.D.     Review of Systems  Constitutional: Positive for fever, chills,  malaise/fatigue and diaphoresis. Negative for weight loss.  HENT: Negative.   Eyes: Negative.   Respiratory: Negative.   Cardiovascular: Negative.   Gastrointestinal: Positive for heartburn, nausea, vomiting, abdominal pain (mild diffuse) and diarrhea. Negative for constipation, blood in stool  and melena.  Genitourinary: Negative.   Musculoskeletal: Negative.   Skin: Negative.   Neurological: Negative.  Negative for weakness.  Endo/Heme/Allergies: Negative.   Psychiatric/Behavioral: Negative.    Blood pressure 160/88, pulse 85, temperature 98.7 F (37.1 C), temperature source Oral, resp. rate 18, height 5' (1.524 m), weight 81.647 kg (180 lb), SpO2 94.00%. Physical Exam  Constitutional: He is oriented to person, place, and time. He appears well-developed and well-nourished. No distress.  HENT:  Head: Normocephalic and atraumatic.  Eyes: Conjunctivae and EOM are normal. Pupils are equal, round, and reactive to light. No scleral icterus.  Neck: Normal range of motion. Neck supple. No tracheal deviation present. No thyromegaly present.  Cardiovascular: Normal rate, regular rhythm and normal heart sounds.   Respiratory: Effort normal and breath sounds normal. No respiratory distress. He has no wheezes.  GI: Soft. He exhibits distension. He exhibits no mass. There is tenderness (mild diffuse tenderness. No peritoneal signs.). There is no rebound and no guarding.  Musculoskeletal: Normal range of motion.  Lymphadenopathy:    He has no cervical adenopathy.  Neurological: He is alert and oriented to person, place, and time.  Skin: Skin is warm and dry.    Assessment/Plan: Small bowel obstruction. At this point I be suspicious for adhesion causing his symptoms however patient does not have a great history for adhesive disease. However there is no evidence of a neoplastic or hernia causing his transition point. At this point continue with bowel rest. Continue with IV fluid hydration. Continue  DVT prophylaxis. I did recommend placement of a nasogastric tube especially given the patient's persistent symptoms despite initial conservative treatment. Surgical indications for both urgent as well as long-term intervention were discussed with the patient and family and they expressed understanding. At this point I will continue to follow the patient's course but hopefully patient will resolve without intraoperative intervention.  Yarexi Pawlicki C 04/23/2012, 2:01 PM

## 2012-04-23 NOTE — Progress Notes (Signed)
Subjective: Patient feels slightly better today. He vomited last night and he also moved his bowel.  Objective: Vital signs in last 24 hours: Temp:  [98.7 F (37.1 C)-99.1 F (37.3 C)] 98.7 F (37.1 C) (08/20 0543) Pulse Rate:  [75-98] 85  (08/20 0543) Resp:  [16-22] 18  (08/20 0543) BP: (143-175)/(69-90) 160/88 mmHg (08/20 0543) SpO2:  [94 %-96 %] 94 % (08/20 0543) Weight:  [81.647 kg (180 lb)] 81.647 kg (180 lb) (08/19 1730) Weight change:     Intake/Output from previous day: 08/19 0701 - 08/20 0700 In: 120 [P.O.:120] Out: -   PHYSICAL EXAM General appearance: alert and no distress Resp: clear to auscultation bilaterally Cardio: S1, S2 normal GI: mild distension and active bowel sound Extremities: extremities normal, atraumatic, no cyanosis or edema  Lab Results:    @labtest @ [facility] No results found for this basename: PHART,PCO2,PO2ART,TCO2,HCO3 in the last 72 hours CULTURES No results found for this or any previous visit (from the past 240 hour(s)). Studies/Results: Dg Chest 2 View  04/22/2012  *RADIOLOGY REPORT*  Clinical Data: Chest pain, cold sweats, hypertension  CHEST - 2 VIEW  Comparison: 11/13/2008  Findings: Biconvex thoracolumbar scoliosis. Borderline enlargement of cardiac silhouette. Mediastinal contours and pulmonary vascularity normal. No definite infiltrate, pleural effusion, or pneumothorax. Question prior thyroidectomy. Mild peribronchial thickening. Surgical clips right upper quadrant question cholecystectomy.  IMPRESSION: Borderline enlargement of cardiac silhouette. Mild bronchitic changes.   Original Report Authenticated By: Lollie Marrow, M.D. ( 04/22/2012 10:02:45 )    Dg Abd 1 View  04/22/2012  *RADIOLOGY REPORT*  Clinical Data: Nausea, vomiting, abdominal pain and distension since yesterday.  ABDOMEN - 1 VIEW  Comparison: PET CT 09/16/2008.  CT abdomen and pelvis 09/09/2008.  Findings: Prominent gaseous distension of mid abdominal small bowel with  nondistended partially stool filled colon.  Changes are consistent with small bowel obstruction.  No radiopaque stones. Surgical clips in the right upper quadrant.  Lumbar scoliosis with mild degenerative change.  IMPRESSION: Gas distended mid abdominal small bowel loops consistent with obstruction.   Original Report Authenticated By: Marlon Pel, M.D.     Medications: I have reviewed the patient's current medications.  Assesment: . Small bowel obstruction  2.GERD  3. Mild dehydration  4. Hypertension  5.hypothyroidism  Active Problems:  * No active hospital problems. *     Plan: Keep NPO Talked to surgeon on call and promised will evaluate patient We do CT of abdomen Will replace K+    LOS: 1 day   Dewanna Hurston 04/23/2012, 8:13 AM

## 2012-04-23 NOTE — Progress Notes (Signed)
UR Chart Review Completed  

## 2012-04-24 LAB — BASIC METABOLIC PANEL
GFR calc Af Amer: >90 mL/min (ref >90)
GFR calc non Af Amer: 80 mL/min — ABNORMAL LOW (ref >90)
Potassium: 3.7 mEq/L (ref 3.5–5.1)
Sodium: 138 mEq/L (ref 135–145)

## 2012-04-24 LAB — CBC
MCHC: 34 g/dL (ref 30.0–36.0)
RDW: 15.9 % — ABNORMAL HIGH (ref 11.5–15.5)

## 2012-04-24 MED ORDER — BIOTENE DRY MOUTH MT LIQD
15.0000 mL | Freq: Two times a day (BID) | OROMUCOSAL | Status: DC
  Administered 2012-04-24 – 2012-04-25 (×4): 15 mL via OROMUCOSAL

## 2012-04-24 MED ORDER — SODIUM CHLORIDE 0.9 % IJ SOLN
INTRAMUSCULAR | Status: AC
  Administered 2012-04-24: 10 mL
  Filled 2012-04-24: qty 3

## 2012-04-24 MED ORDER — METOCLOPRAMIDE HCL 5 MG/ML IJ SOLN
5.0000 mg | Freq: Four times a day (QID) | INTRAMUSCULAR | Status: DC | PRN
  Administered 2012-04-24 – 2012-04-26 (×4): 5 mg via INTRAVENOUS
  Filled 2012-04-24 (×3): qty 2
  Filled 2012-04-24: qty 1
  Filled 2012-04-24: qty 2

## 2012-04-24 MED ORDER — CHLORPROMAZINE HCL 25 MG/ML IJ SOLN
INTRAMUSCULAR | Status: AC
  Filled 2012-04-24: qty 2

## 2012-04-24 MED ORDER — DEXTROSE-NACL 5-0.9 % IV SOLN
INTRAVENOUS | Status: DC
  Administered 2012-04-24 – 2012-04-25 (×4): via INTRAVENOUS
  Administered 2012-04-26: 100 mL/h via INTRAVENOUS
  Administered 2012-04-26 (×2): via INTRAVENOUS
  Administered 2012-04-27: 100 mL/h via INTRAVENOUS

## 2012-04-24 MED ORDER — DEXTROSE 5 % IV SOLN
500.0000 mg | INTRAVENOUS | Status: DC
  Administered 2012-04-24: 500 mg via INTRAVENOUS
  Filled 2012-04-24 (×3): qty 500

## 2012-04-24 MED ORDER — SODIUM CHLORIDE 0.9 % IV SOLN
25.0000 mg | Freq: Four times a day (QID) | INTRAVENOUS | Status: DC | PRN
  Administered 2012-04-24 – 2012-04-26 (×5): 25 mg via INTRAVENOUS
  Filled 2012-04-24: qty 1

## 2012-04-24 MED ORDER — DEXTROSE 5 % IV SOLN
1.0000 g | INTRAVENOUS | Status: DC
  Administered 2012-04-24 – 2012-04-25 (×2): 1 g via INTRAVENOUS
  Filled 2012-04-24 (×3): qty 10

## 2012-04-24 NOTE — Progress Notes (Signed)
04/24/12 1907 Patient continues to have persistent hiccups this afternoon, eased some while sleeping this afternoon but back on waking up. Notified Dr Juanetta Gosling, order received to give dose of reglan now and then use thorazine PRN for hiccups not relieved by reglan as ordered. Riccardo Dubin

## 2012-04-24 NOTE — Care Management Note (Signed)
  Page 2 of 2   05/07/2012     10:26:09 AM   CARE MANAGEMENT NOTE 05/07/2012  Patient:  ABNER, ARDIS   Account Number:  [id number]  Date Initiated:  04/24/2012  Documentation initiated by:  Sharrie Rothman  Subjective/Objective Assessment:   Pt admitted from home with SBO. Pt has a grandson that lives with him and pt will return home at discharge. Pt is independent with ADL's.     Action/Plan:   No CM/HH  needs at this time. Will continue to follow.   Anticipated DC Date:  05/07/2012   Anticipated DC Plan:  HOME W HOME HEALTH SERVICES  In-house referral  Clinical Social Worker      DC Planning Services  CM consult      Choice offered to / List presented to:          [facility] arranged  HH-2 PT  HH-3 OT      [facility] agency  [facility]   Status of service:  In process, will continue to follow Medicare Important Message given?   (If response is "NO", the following Medicare IM given date fields will be blank) Date Medicare IM given:   Date Additional Medicare IM given:    Discharge Disposition:    Per UR Regulation:  Reviewed for med. necessity/level of care/duration of stay  If discussed at Long Length of Stay Meetings, dates discussed:   04/30/2012  05/02/2012    Comments:  05-07-12 Spoke with patient regarding home health. Patient wants [facility]. referral made for PT / OT .  Asked Barnetta Chapel if [facility] was needed .  Staples will be removed today before discharge, no need for [facility].  PT recommending rolling walker and 3 in 1 bedside commode.  Patient stated he is going to use his friend's rolling walker .  Patient also thoinks his friend has a 3 in 1 . Patient will check and let case management know if 3 in 1 needed.  Explained to patient due to his insurance if equipment needed , will have to order through [employer] and be picked up. Patient voiced understanding   Ronny Flurry RN BSN [phone]    05-02-12 8-29- Post op ileus, resolving : start on clear  liquid diet today  Insurance denied CIR. CSW has given patient SNF offers.  Ronny Flurry RN BSN [phone]  05/01/12 1030 Henrietta Mayo RN MSN CCM CSW following for transfer to SNF for rehab when medically stable.  04/29/12 1504 Henrietta Mayo RN MSN CCM Transferred to [facility].  PT recommending CIR and rolling walker.  Discussed inpt rehab with pt who would like eval for same, states grandson will not be available 24/7 as he works @ night.  Per PT, pt is very motivated to improve mobility.  04/24/12 1037 Arlyss Queen, RN BSN CM

## 2012-04-24 NOTE — Progress Notes (Signed)
04/24/12 1603 Patient NPO at this time, NG tube in place. Notified Dr Felecia Shelling of patient having elevated BP this morning and unable to take po medications at this time. Stated to keep NPO and okay to hold po meds. No new orders at this time for BP medications, stated continue to monitor. Victor Hunter

## 2012-04-24 NOTE — Progress Notes (Signed)
Subjective: Patient has NG tube and complaining of abdominal distension. He is coughing intermitently. No fever or chills. Objective: Vital signs in last 24 hours: Temp:  [98.7 F (37.1 C)-99.3 F (37.4 C)] 98.7 F (37.1 C) (08/21 0424) Pulse Rate:  [82-86] 82  (08/21 0504) Resp:  [18] 18  (08/21 0424) BP: (158-179)/(76-96) 163/76 mmHg (08/21 0504) SpO2:  [94 %-96 %] 96 % (08/21 0504) Weight change:  Last BM Date: 04/22/12  Intake/Output from previous day: 08/20 0701 - 08/21 0700 In: 2464.2 [I.V.:2164.2; IV Piggyback:300] Out: 300 [Emesis/NG output:300]  PHYSICAL EXAM General appearance: alert and no distress Resp: clear to auscultation bilaterally Cardio: S1, S2 normal GI: mild distension and active bowel sound Extremities: extremities normal, atraumatic, no cyanosis or edema  Lab Results:    @labtest @ [facility] No results found for this basename: PHART,PCO2,PO2ART,TCO2,HCO3 in the last 72 hours CULTURES No results found for this or any previous visit (from the past 240 hour(s)). Studies/Results: Dg Chest 2 View  04/22/2012  *RADIOLOGY REPORT*  Clinical Data: Chest pain, cold sweats, hypertension  CHEST - 2 VIEW  Comparison: 11/13/2008  Findings: Biconvex thoracolumbar scoliosis. Borderline enlargement of cardiac silhouette. Mediastinal contours and pulmonary vascularity normal. No definite infiltrate, pleural effusion, or pneumothorax. Question prior thyroidectomy. Mild peribronchial thickening. Surgical clips right upper quadrant question cholecystectomy.  IMPRESSION: Borderline enlargement of cardiac silhouette. Mild bronchitic changes.   Original Report Authenticated By: Lollie Marrow, M.D. ( 04/22/2012 10:02:45 )    Dg Abd 1 View  04/22/2012  *RADIOLOGY REPORT*  Clinical Data: Nausea, vomiting, abdominal pain and distension since yesterday.  ABDOMEN - 1 VIEW  Comparison: PET CT 09/16/2008.  CT abdomen and pelvis 09/09/2008.  Findings: Prominent gaseous distension of mid  abdominal small bowel with nondistended partially stool filled colon.  Changes are consistent with small bowel obstruction.  No radiopaque stones. Surgical clips in the right upper quadrant.  Lumbar scoliosis with mild degenerative change.  IMPRESSION: Gas distended mid abdominal small bowel loops consistent with obstruction.   Original Report Authenticated By: Marlon Pel, M.D.    Ct Abdomen Pelvis W Contrast  04/23/2012  *RADIOLOGY REPORT*  Clinical Data: Bowel obstruction by plain film  CT ABDOMEN AND PELVIS WITH CONTRAST  Technique:  Multidetector CT imaging of the abdomen and pelvis was performed following the standard protocol during bolus administration of intravenous contrast.  Contrast: OMNIPAQUE IOHEXOL 300 MG/ML  SOLN  Comparison: Abdomen films of 04/22/2012 and CT abdomen pelvis of 09/09/2008  Findings: There is parenchymal opacity within the left lower lobe consistent with pneumonia.  Also is minimal bronchiectatic change in the left lower lobe.  The right lung base is clear.  The liver enhances with no focal abnormality and no ductal dilatation is seen.  Surgical clips are present from prior cholecystectomy.  The pancreas is normal in size and the pancreatic duct is not dilated. The adrenal glands and spleen are unremarkable with a small splenule present.  The kidneys enhance with no calculus or mass and no hydronephrosis is seen.  On delayed images, the pelvocaliceal systems are unremarkable.  The abdominal aorta is normal in caliber.  There are dilated loops of small bowel proximally.  The distal small bowel is decompressed to the terminal ileum.  There does appear to be of a point of caliber change within the anterior left lower abdomen without evidence of mass.  This most likely is due to an adhesion.  There is a small amount of free fluid within the right  lower quadrant and pelvis.  The urinary bladder is not well opacified, but the prostate appears to be significantly enlarged and  very inhomogeneous.  Invasion of the posterior inferior aspect of urinary bladder cannot be excluded on this exam.  Delayed images may be helpful to assess the urinary bladder more detail.  No abnormality of the colon is seen.  The terminal ileum is unremarkable.  Degenerative changes are present throughout the facet joints of the lower lumbar spine. The patient was brought back for delayed images through the pelvis for better filling of the urinary bladder.  Again the prostate is noted to be significantly enlarged and irregular worrisome for prostate carcinoma.  The enlarged prostate does indent the urinary bladder and there is some irregularity to the posterior bladder wall, making invasion of tumor a possibility.  IMPRESSION:  1.  Partial small bowel obstruction with apparent caliber change within the anterior left lower abdomen most likely due to adhesion. No mass is seen. 2.  Small amount of free fluid layering within the right lower quadrant and pelvis. 3.  Very irregular inhomogeneous and enlarged prostate worrisome for prostate carcinoma.  Invasion of urinary bladder cannot be excluded. 3.  Left lower lobe parenchymal opacity most consistent with pneumonia.   Original Report Authenticated By: Juline Patch, M.D.     Medications: I have reviewed the patient's current medications.  Assesment: 1. Small bowel obstruction  2.. Pneumonia 3.GERD  4. Mild dehydration  5. Hypertension  6.hypothyroidism   Active Problems:  * No active hospital problems. *     Plan: Surgical consult appreciated Continue NG tube suction as per Surgery recommendation We will start on antibiotics for pneumonia (CT scan + for pneumonia) Change IV fluid to D5N/S   LOS: 2 days   Demosthenes Virnig 04/24/2012, 7:56 AM

## 2012-04-24 NOTE — Progress Notes (Signed)
Subjective: Still with some nausea. One episode of emesis last night. No complaints of abdominal pain today. Some flatus. No bowel movement.  Objective: Vital signs in last 24 hours: Temp:  [98.6 F (37 C)-99.3 F (37.4 C)] 98.6 F (37 C) (08/21 0822) Pulse Rate:  [82-86] 86  (08/21 0822) Resp:  [18] 18  (08/21 0822) BP: (158-179)/(76-96) 168/86 mmHg (08/21 0822) SpO2:  [94 %-100 %] 100 % (08/21 0822) Last BM Date: 04/22/12  Intake/Output from previous day: 08/20 0701 - 08/21 0700 In: 2464.2 [I.V.:2164.2; IV Piggyback:300] Out: 300 [Emesis/NG output:300] Intake/Output this shift: Total I/O In: 310 [I.V.:10; IV Piggyback:300] Out: -   General appearance: Uncomfortable in appearance. No acute distress. GI: Quiet, soft, distended, mild diffuse tenderness. No peritoneal signs. No masses or hernias.  Lab Results:   Basename 04/24/12 0450 04/23/12 0507  WBC 11.0* 11.1*  HGB 13.1 13.2  HCT 38.5* 40.3  PLT 198 190   BMET  Basename 04/24/12 0450 04/23/12 0507  NA 138 138  K 3.7 3.1*  CL 103 100  CO2 27 26  GLUCOSE 111* 174*  BUN 19 21  CREATININE 0.93 1.05  CALCIUM 8.2* 9.4   PT/INR No results found for this basename: LABPROT:2,INR:2 in the last 72 hours ABG No results found for this basename: PHART:2,PCO2:2,PO2:2,HCO3:2 in the last 72 hours  Studies/Results: Dg Abd 1 View  04/22/2012  *RADIOLOGY REPORT*  Clinical Data: Nausea, vomiting, abdominal pain and distension since yesterday.  ABDOMEN - 1 VIEW  Comparison: PET CT 09/16/2008.  CT abdomen and pelvis 09/09/2008.  Findings: Prominent gaseous distension of mid abdominal small bowel with nondistended partially stool filled colon.  Changes are consistent with small bowel obstruction.  No radiopaque stones. Surgical clips in the right upper quadrant.  Lumbar scoliosis with mild degenerative change.  IMPRESSION: Gas distended mid abdominal small bowel loops consistent with obstruction.   Original Report Authenticated  By: Marlon Pel, M.D.    Ct Abdomen Pelvis W Contrast  04/23/2012  *RADIOLOGY REPORT*  Clinical Data: Bowel obstruction by plain film  CT ABDOMEN AND PELVIS WITH CONTRAST  Technique:  Multidetector CT imaging of the abdomen and pelvis was performed following the standard protocol during bolus administration of intravenous contrast.  Contrast: OMNIPAQUE IOHEXOL 300 MG/ML  SOLN  Comparison: Abdomen films of 04/22/2012 and CT abdomen pelvis of 09/09/2008  Findings: There is parenchymal opacity within the left lower lobe consistent with pneumonia.  Also is minimal bronchiectatic change in the left lower lobe.  The right lung base is clear.  The liver enhances with no focal abnormality and no ductal dilatation is seen.  Surgical clips are present from prior cholecystectomy.  The pancreas is normal in size and the pancreatic duct is not dilated. The adrenal glands and spleen are unremarkable with a small splenule present.  The kidneys enhance with no calculus or mass and no hydronephrosis is seen.  On delayed images, the pelvocaliceal systems are unremarkable.  The abdominal aorta is normal in caliber.  There are dilated loops of small bowel proximally.  The distal small bowel is decompressed to the terminal ileum.  There does appear to be of a point of caliber change within the anterior left lower abdomen without evidence of mass.  This most likely is due to an adhesion.  There is a small amount of free fluid within the right lower quadrant and pelvis.  The urinary bladder is not well opacified, but the prostate appears to be significantly enlarged and very  inhomogeneous.  Invasion of the posterior inferior aspect of urinary bladder cannot be excluded on this exam.  Delayed images may be helpful to assess the urinary bladder more detail.  No abnormality of the colon is seen.  The terminal ileum is unremarkable.  Degenerative changes are present throughout the facet joints of the lower lumbar spine. The  patient was brought back for delayed images through the pelvis for better filling of the urinary bladder.  Again the prostate is noted to be significantly enlarged and irregular worrisome for prostate carcinoma.  The enlarged prostate does indent the urinary bladder and there is some irregularity to the posterior bladder wall, making invasion of tumor a possibility.  IMPRESSION:  1.  Partial small bowel obstruction with apparent caliber change within the anterior left lower abdomen most likely due to adhesion. No mass is seen. 2.  Small amount of free fluid layering within the right lower quadrant and pelvis. 3.  Very irregular inhomogeneous and enlarged prostate worrisome for prostate carcinoma.  Invasion of urinary bladder cannot be excluded. 3.  Left lower lobe parenchymal opacity most consistent with pneumonia.   Original Report Authenticated By: Juline Patch, M.D.     Anti-infectives: Anti-infectives     Start     Dose/Rate Route Frequency Ordered Stop   04/24/12 0930   azithromycin (ZITHROMAX) 500 mg in dextrose 5 % 250 mL IVPB        500 mg 250 mL/hr over 60 Minutes Intravenous Every 24 hours 04/24/12 0753     04/24/12 0900   cefTRIAXone (ROCEPHIN) 1 g in dextrose 5 % 50 mL IVPB        1 g 100 mL/hr over 30 Minutes Intravenous Every 24 hours 04/24/12 0753            Assessment/Plan: s/p * No surgery found * Persistent small bowel obstruction. Continue conservative management at this time. Continue nasogastric decompression. Continue await for bowel function improvement. I did discuss with the family again indications to proceed to the operating room. As patient has had limited response of the last 24 hours am suspicious that he is likely to progress however we'll watch him another 24-48 hours prior to plan to coloration. Again indications for urgent exploration were discussed with family. At this time there is no acute indications the  LOS: 2 days    Victor Hunter C 04/24/2012

## 2012-04-24 NOTE — Progress Notes (Signed)
Patient has spouts of moments where he gasps for air and his heart rate drops to the 30's but episode only last a few seconds then patient breaths fine and heart rate is normal. Also patients daughter requested something to control his BP, states he takes BP meds at home

## 2012-04-25 LAB — BASIC METABOLIC PANEL
CO2: 29 mEq/L (ref 19–32)
GFR calc non Af Amer: 80 mL/min — ABNORMAL LOW (ref >90)
Glucose, Bld: 129 mg/dL — ABNORMAL HIGH (ref 70–99)
Potassium: 3.2 mEq/L — ABNORMAL LOW (ref 3.5–5.1)
Sodium: 140 mEq/L (ref 135–145)

## 2012-04-25 LAB — CBC
Hemoglobin: 12 g/dL — ABNORMAL LOW (ref 13.0–17.0)
MCHC: 32.2 g/dL (ref 30.0–36.0)
Platelets: 199 10*3/uL (ref 150–400)
RBC: 4.74 MIL/uL (ref 4.22–5.81)

## 2012-04-25 LAB — SURGICAL PCR SCREEN: MRSA, PCR: NEGATIVE

## 2012-04-25 MED ORDER — SODIUM CHLORIDE 0.9 % IJ SOLN
INTRAMUSCULAR | Status: AC
  Filled 2012-04-25: qty 3

## 2012-04-25 MED ORDER — DEXTROSE 5 % IV SOLN
2.0000 g | INTRAVENOUS | Status: DC
  Filled 2012-04-25: qty 2

## 2012-04-25 MED ORDER — CHLORPROMAZINE HCL 25 MG/ML IJ SOLN
INTRAMUSCULAR | Status: AC
  Filled 2012-04-25: qty 2

## 2012-04-25 MED ORDER — LORAZEPAM 2 MG/ML IJ SOLN
0.5000 mg | INTRAMUSCULAR | Status: DC | PRN
  Administered 2012-04-25: 0.5 mg via INTRAVENOUS
  Filled 2012-04-25: qty 1

## 2012-04-25 MED ORDER — ENOXAPARIN SODIUM 40 MG/0.4ML ~~LOC~~ SOLN
40.0000 mg | SUBCUTANEOUS | Status: AC
  Administered 2012-04-26: 40 mg via SUBCUTANEOUS

## 2012-04-25 MED ORDER — CHLORHEXIDINE GLUCONATE CLOTH 2 % EX PADS
6.0000 | MEDICATED_PAD | Freq: Every day | CUTANEOUS | Status: DC
  Administered 2012-04-27: 6 via TOPICAL

## 2012-04-25 MED ORDER — MUPIROCIN 2 % EX OINT
1.0000 "application " | TOPICAL_OINTMENT | Freq: Two times a day (BID) | CUTANEOUS | Status: AC
  Administered 2012-04-25 – 2012-04-30 (×9): 1 via NASAL
  Filled 2012-04-25 (×3): qty 22

## 2012-04-25 MED ORDER — POTASSIUM CHLORIDE 10 MEQ/100ML IV SOLN
10.0000 meq | INTRAVENOUS | Status: AC
  Administered 2012-04-25 (×3): 10 meq via INTRAVENOUS
  Filled 2012-04-25: qty 300

## 2012-04-25 NOTE — Progress Notes (Signed)
  Subjective: No significant change.  No fever or chills.  Nausea about the same.  Still with Hiccups.  No change in abdominal pain.  +flatus.  No BM.  Objective: Vital signs in last 24 hours: Temp:  [98 F (36.7 C)-99.2 F (37.3 C)] 98.7 F (37.1 C) (08/22 0539) Pulse Rate:  [70-75] 75  (08/22 0539) Resp:  [20] 20  (08/22 0539) BP: (144-172)/(84-92) 149/91 mmHg (08/22 0539) SpO2:  [95 %-100 %] 97 % (08/22 0539) Last BM Date: 04/22/12  Intake/Output from previous day: 08/21 0701 - 08/22 0700 In: 2541.7 [I.V.:2191.7; IV Piggyback:350] Out: 900 [Urine:600; Emesis/NG output:300] Intake/Output this shift:    General appearance: alert and no distress Resp: clear to auscultation bilaterally Cardio: regular rate and rhythm GI: quiet, distended.  Mild diffuse tenderness. No peritoneal signs.    Lab Results:   Basename 04/25/12 0445 04/24/12 0450  WBC 10.4 11.0*  HGB 12.0* 13.1  HCT 37.3* 38.5*  PLT 199 198   BMET  Basename 04/25/12 0445 04/24/12 0450  NA 140 138  K 3.2* 3.7  CL 104 103  CO2 29 27  GLUCOSE 129* 111*  BUN 15 19  CREATININE 0.93 0.93  CALCIUM 7.8* 8.2*   PT/INR No results found for this basename: LABPROT:2,INR:2 in the last 72 hours ABG No results found for this basename: PHART:2,PCO2:2,PO2:2,HCO3:2 in the last 72 hours  Studies/Results: No results found.  Anti-infectives: Anti-infectives     Start     Dose/Rate Route Frequency Ordered Stop   04/24/12 0930   azithromycin (ZITHROMAX) 500 mg in dextrose 5 % 250 mL IVPB        500 mg 250 mL/hr over 60 Minutes Intravenous Every 24 hours 04/24/12 0753     04/24/12 0900   cefTRIAXone (ROCEPHIN) 1 g in dextrose 5 % 50 mL IVPB        1 g 100 mL/hr over 30 Minutes Intravenous Every 24 hours 04/24/12 0753            Assessment/Plan: s/p * No surgery found * SBO.  Will plan to proceed to the OR tomorrow if no change.  Long discussion with the patient and family regarding indications and plans for  surgery.  They express understanding.  Hypokalemia.  WIll address.   LOS: 3 days    Martasia Talamante C 04/25/2012

## 2012-04-25 NOTE — Progress Notes (Signed)
Subjective: Patient is sleepy and sedated. He had recurrent hiccup yesterday. His abdomen is still distended. He has not moved his bowel.  Objective: Vital signs in last 24 hours: Temp:  [98 F (36.7 C)-99.2 F (37.3 C)] 98.7 F (37.1 C) (08/22 0539) Pulse Rate:  [70-86] 75  (08/22 0539) Resp:  [18-20] 20  (08/22 0539) BP: (144-172)/(84-92) 149/91 mmHg (08/22 0539) SpO2:  [95 %-100 %] 97 % (08/22 0539) Weight change:  Last BM Date: 04/22/12  Intake/Output from previous day: 08/21 0701 - 08/22 0700 In: 2541.7 [I.V.:2191.7; IV Piggyback:350] Out: 900 [Urine:600; Emesis/NG output:300]  PHYSICAL EXAM General appearance: alert and no distress Resp: clear to auscultation bilaterally Cardio: S1, S2 normal GI: mild distension and active bowel sound Extremities: extremities normal, atraumatic, no cyanosis or edema  Lab Results:    @labtest @ [facility] No results found for this basename: PHART,PCO2,PO2ART,TCO2,HCO3 in the last 72 hours CULTURES No results found for this or any previous visit (from the past 240 hour(s)). Studies/Results: Ct Abdomen Pelvis W Contrast  04/23/2012  *RADIOLOGY REPORT*  Clinical Data: Bowel obstruction by plain film  CT ABDOMEN AND PELVIS WITH CONTRAST  Technique:  Multidetector CT imaging of the abdomen and pelvis was performed following the standard protocol during bolus administration of intravenous contrast.  Contrast: OMNIPAQUE IOHEXOL 300 MG/ML  SOLN  Comparison: Abdomen films of 04/22/2012 and CT abdomen pelvis of 09/09/2008  Findings: There is parenchymal opacity within the left lower lobe consistent with pneumonia.  Also is minimal bronchiectatic change in the left lower lobe.  The right lung base is clear.  The liver enhances with no focal abnormality and no ductal dilatation is seen.  Surgical clips are present from prior cholecystectomy.  The pancreas is normal in size and the pancreatic duct is not dilated. The adrenal glands and spleen are  unremarkable with a small splenule present.  The kidneys enhance with no calculus or mass and no hydronephrosis is seen.  On delayed images, the pelvocaliceal systems are unremarkable.  The abdominal aorta is normal in caliber.  There are dilated loops of small bowel proximally.  The distal small bowel is decompressed to the terminal ileum.  There does appear to be of a point of caliber change within the anterior left lower abdomen without evidence of mass.  This most likely is due to an adhesion.  There is a small amount of free fluid within the right lower quadrant and pelvis.  The urinary bladder is not well opacified, but the prostate appears to be significantly enlarged and very inhomogeneous.  Invasion of the posterior inferior aspect of urinary bladder cannot be excluded on this exam.  Delayed images may be helpful to assess the urinary bladder more detail.  No abnormality of the colon is seen.  The terminal ileum is unremarkable.  Degenerative changes are present throughout the facet joints of the lower lumbar spine. The patient was brought back for delayed images through the pelvis for better filling of the urinary bladder.  Again the prostate is noted to be significantly enlarged and irregular worrisome for prostate carcinoma.  The enlarged prostate does indent the urinary bladder and there is some irregularity to the posterior bladder wall, making invasion of tumor a possibility.  IMPRESSION:  1.  Partial small bowel obstruction with apparent caliber change within the anterior left lower abdomen most likely due to adhesion. No mass is seen. 2.  Small amount of free fluid layering within the right lower quadrant and pelvis. 3.  Very  irregular inhomogeneous and enlarged prostate worrisome for prostate carcinoma.  Invasion of urinary bladder cannot be excluded. 3.  Left lower lobe parenchymal opacity most consistent with pneumonia.   Original Report Authenticated By: Juline Patch, M.D.     Medications: I  have reviewed the patient's current medications.  Assesment: 1. Small bowel obstruction  2.. Pneumonia 3.GERD  4. Mild dehydration  5. Hypertension  6.hypothyroidism 7. Hypokalemia   Active Problems:  * No active hospital problems. *     Plan: Continue NG tube suctioning Continue Iv antibiotic We will replace K+ As per surgery plan  LOS: 3 days   Victor Hunter 04/25/2012, 7:33 AM

## 2012-04-26 ENCOUNTER — Inpatient Hospital Stay (HOSPITAL_COMMUNITY): Payer: Medicare PPO | Admitting: Anesthesiology

## 2012-04-26 ENCOUNTER — Encounter (HOSPITAL_COMMUNITY)
Admission: EM | Disposition: A | Discharge status: Home Health | Payer: Self-pay | Source: Home / Self Care | Attending: Internal Medicine

## 2012-04-26 ENCOUNTER — Inpatient Hospital Stay (HOSPITAL_COMMUNITY): Payer: Medicare PPO

## 2012-04-26 ENCOUNTER — Encounter (HOSPITAL_COMMUNITY): Payer: Self-pay | Admitting: Anesthesiology

## 2012-04-26 HISTORY — PX: LAPAROTOMY: SHX154

## 2012-04-26 HISTORY — PX: CENTRAL VENOUS CATHETER INSERTION: SHX401

## 2012-04-26 LAB — GLUCOSE, CAPILLARY
Glucose-Capillary: 137 mg/dL — ABNORMAL HIGH (ref 70–99)
Glucose-Capillary: 139 mg/dL — ABNORMAL HIGH (ref 70–99)
Glucose-Capillary: 118 mg/dL — ABNORMAL HIGH (ref 70–99)

## 2012-04-26 LAB — COMPREHENSIVE METABOLIC PANEL
ALT: 20 U/L (ref 0–53)
Alkaline Phosphatase: 93 U/L (ref 39–117)
BUN: 10 mg/dL (ref 6–23)
CO2: 30 mEq/L (ref 19–32)
Chloride: 104 mEq/L (ref 96–112)
GFR calc Af Amer: 81 mL/min — ABNORMAL LOW (ref >90)
GFR calc non Af Amer: 70 mL/min — ABNORMAL LOW (ref >90)
Glucose, Bld: 112 mg/dL — ABNORMAL HIGH (ref 70–99)
Potassium: 3.2 mEq/L — ABNORMAL LOW (ref 3.5–5.1)
Sodium: 141 mEq/L (ref 135–145)
Total Bilirubin: 0.4 mg/dL (ref 0.3–1.2)
Total Protein: 6.1 g/dL (ref 6.0–8.3)

## 2012-04-26 LAB — BLOOD GAS, ARTERIAL
Acid-Base Excess: 2.4 mmol/L — ABNORMAL HIGH (ref 0.0–2.0)
Acid-Base Excess: 1.4 mmol/L (ref 0.0–2.0)
Bicarbonate: 26.5 mEq/L — ABNORMAL HIGH (ref 20.0–24.0)
Bicarbonate: 23 mEq/L (ref 20.0–24.0)
Bicarbonate: 25.1 mEq/L — ABNORMAL HIGH (ref 20.0–24.0)
FIO2: 100 %
FIO2: 100 %
FIO2: 100 %
O2 Saturation: 99.6 %
O2 Saturation: 99.5 %
RATE: 12 resp/min
TCO2: 23.9 mmol/L (ref 0–100)
TCO2: 19.8 mmol/L (ref 0–100)
pCO2 arterial: 19.3 mmHg — CL (ref 35.0–45.0)
pH, Arterial: 7.674 (ref 7.350–7.450)
pO2, Arterial: 250 mmHg — ABNORMAL HIGH (ref 80.0–100.0)
pO2, Arterial: 320 mmHg — ABNORMAL HIGH (ref 80.0–100.0)

## 2012-04-26 LAB — TYPE AND SCREEN

## 2012-04-26 LAB — CBC WITH DIFFERENTIAL/PLATELET
Eosinophils Absolute: 0.3 10*3/uL (ref 0.0–0.7)
Hemoglobin: 11.7 g/dL — ABNORMAL LOW (ref 13.0–17.0)
Lymphocytes Relative: 14 % (ref 12–46)
Lymphs Abs: 1.3 10*3/uL (ref 0.7–4.0)
MCH: 25.9 pg — ABNORMAL LOW (ref 26.0–34.0)
Monocytes Relative: 10 % (ref 3–12)
Neutrophils Relative %: 73 % (ref 43–77)
RBC: 4.51 MIL/uL (ref 4.22–5.81)
WBC: 9.9 10*3/uL (ref 4.0–10.5)

## 2012-04-26 SURGERY — LAPAROTOMY, EXPLORATORY
Anesthesia: General | Site: Chest | Wound class: Clean Contaminated

## 2012-04-26 SURGERY — LAPAROTOMY, EXPLORATORY
Anesthesia: Moderate Sedation

## 2012-04-26 MED ORDER — SODIUM CHLORIDE 0.9 % IV SOLN
50.0000 ug/h | INTRAVENOUS | Status: DC
  Administered 2012-04-26: 50 ug/h via INTRAVENOUS
  Administered 2012-04-27: 400 ug/h via INTRAVENOUS
  Filled 2012-04-26: qty 50

## 2012-04-26 MED ORDER — ARTIFICIAL TEARS OP OINT
TOPICAL_OINTMENT | OPHTHALMIC | Status: AC
  Filled 2012-04-26: qty 3.5

## 2012-04-26 MED ORDER — ONDANSETRON HCL 4 MG/2ML IJ SOLN
INTRAMUSCULAR | Status: AC
  Filled 2012-04-26: qty 2

## 2012-04-26 MED ORDER — CHLORHEXIDINE GLUCONATE 0.12 % MT SOLN
15.0000 mL | Freq: Two times a day (BID) | OROMUCOSAL | Status: DC
  Administered 2012-04-26 – 2012-05-02 (×14): 15 mL via OROMUCOSAL
  Filled 2012-04-26 (×14): qty 15

## 2012-04-26 MED ORDER — MIDAZOLAM HCL 2 MG/2ML IJ SOLN
1.0000 mg | INTRAMUSCULAR | Status: DC | PRN
  Administered 2012-04-26: 2 mg via INTRAVENOUS

## 2012-04-26 MED ORDER — PROPOFOL 10 MG/ML IV EMUL
5.0000 ug/kg/min | INTRAVENOUS | Status: DC
  Administered 2012-04-26: 1000 mg via INTRAVENOUS
  Administered 2012-04-26: 20 ug/kg/min via INTRAVENOUS
  Administered 2012-04-27: 40 ug/kg/min via INTRAVENOUS
  Administered 2012-04-27: 70 ug/kg/min via INTRAVENOUS
  Administered 2012-04-27: 60 ug/kg/min via INTRAVENOUS
  Administered 2012-04-27: 50 ug/kg/min via INTRAVENOUS
  Administered 2012-04-28 (×3): 60 ug/kg/min via INTRAVENOUS
  Filled 2012-04-26 (×10): qty 100

## 2012-04-26 MED ORDER — LACTATED RINGERS IV SOLN: INTRAVENOUS | Status: DC | PRN

## 2012-04-26 MED ORDER — LABETALOL HCL 5 MG/ML IV SOLN
INTRAVENOUS | Status: DC | PRN
  Administered 2012-04-26 (×2): 5 mg via INTRAVENOUS

## 2012-04-26 MED ORDER — ALBUTEROL SULFATE (5 MG/ML) 0.5% IN NEBU
2.5000 mg | INHALATION_SOLUTION | RESPIRATORY_TRACT | Status: DC
  Administered 2012-04-26 – 2012-04-27 (×5): 2.5 mg via RESPIRATORY_TRACT
  Filled 2012-04-26 (×5): qty 0.5

## 2012-04-26 MED ORDER — ROCURONIUM BROMIDE 50 MG/5ML IV SOLN
INTRAVENOUS | Status: AC
  Filled 2012-04-26: qty 1

## 2012-04-26 MED ORDER — IPRATROPIUM BROMIDE 0.02 % IN SOLN
0.5000 mg | RESPIRATORY_TRACT | Status: DC
  Administered 2012-04-26 – 2012-04-27 (×5): 0.5 mg via RESPIRATORY_TRACT
  Filled 2012-04-26 (×5): qty 2.5

## 2012-04-26 MED ORDER — SODIUM CHLORIDE 0.9 % IJ SOLN
INTRAMUSCULAR | Status: AC
  Filled 2012-04-26: qty 9

## 2012-04-26 MED ORDER — LIDOCAINE HCL (CARDIAC) 10 MG/ML IV SOLN
INTRAVENOUS | Status: DC | PRN
  Administered 2012-04-26: 50 mg via INTRAVENOUS

## 2012-04-26 MED ORDER — MIDAZOLAM HCL 2 MG/2ML IJ SOLN
INTRAMUSCULAR | Status: AC
  Filled 2012-04-26: qty 2

## 2012-04-26 MED ORDER — LABETALOL HCL 5 MG/ML IV SOLN
INTRAVENOUS | Status: AC
  Filled 2012-04-26: qty 4

## 2012-04-26 MED ORDER — DEXTROSE 5 % IV SOLN
INTRAVENOUS | Status: AC
  Filled 2012-04-26: qty 2

## 2012-04-26 MED ORDER — FENTANYL CITRATE 0.05 MG/ML IJ SOLN
INTRAMUSCULAR | Status: AC
  Filled 2012-04-26: qty 2

## 2012-04-26 MED ORDER — ROCURONIUM BROMIDE 100 MG/10ML IV SOLN
INTRAVENOUS | Status: DC | PRN
  Administered 2012-04-26: 50 mg via INTRAVENOUS
  Administered 2012-04-26: 20 mg via INTRAVENOUS

## 2012-04-26 MED ORDER — GLYCOPYRROLATE 0.2 MG/ML IJ SOLN
0.2000 mg | Freq: Once | INTRAMUSCULAR | Status: AC
  Administered 2012-04-26: 0.2 mg via INTRAVENOUS

## 2012-04-26 MED ORDER — SUCCINYLCHOLINE CHLORIDE 20 MG/ML IJ SOLN
INTRAMUSCULAR | Status: AC
  Filled 2012-04-26: qty 1

## 2012-04-26 MED ORDER — LACTATED RINGERS IV SOLN
INTRAVENOUS | Status: DC | PRN
  Administered 2012-04-26 (×2): via INTRAVENOUS

## 2012-04-26 MED ORDER — ONDANSETRON HCL 4 MG/2ML IJ SOLN: 4.0000 mg | Freq: Once | INTRAMUSCULAR | Status: AC | PRN

## 2012-04-26 MED ORDER — DEXTROSE 5 % IV SOLN
2.0000 g | INTRAVENOUS | Status: DC | PRN
  Administered 2012-04-26: 2 g via INTRAVENOUS

## 2012-04-26 MED ORDER — HYDROMORPHONE HCL PF 1 MG/ML IJ SOLN
1.0000 mg | INTRAMUSCULAR | Status: DC | PRN
  Administered 2012-04-26: 1 mg via INTRAVENOUS
  Administered 2012-04-26: 2 mg via INTRAVENOUS
  Administered 2012-04-26 (×3): 1 mg via INTRAVENOUS
  Filled 2012-04-26: qty 2
  Filled 2012-04-26 (×2): qty 1
  Filled 2012-04-26: qty 2
  Filled 2012-04-26: qty 1

## 2012-04-26 MED ORDER — ENOXAPARIN SODIUM 40 MG/0.4ML ~~LOC~~ SOLN
SUBCUTANEOUS | Status: AC
  Filled 2012-04-26: qty 0.4

## 2012-04-26 MED ORDER — FENTANYL CITRATE 0.05 MG/ML IJ SOLN
25.0000 ug | INTRAMUSCULAR | Status: DC | PRN
  Administered 2012-04-26: 50 ug via INTRAVENOUS
  Filled 2012-04-26: qty 2

## 2012-04-26 MED ORDER — PROPOFOL 10 MG/ML IV EMUL: 5.0000 ug/kg/min | INTRAVENOUS | Status: DC

## 2012-04-26 MED ORDER — GLYCOPYRROLATE 0.2 MG/ML IJ SOLN
INTRAMUSCULAR | Status: AC
  Filled 2012-04-26: qty 1

## 2012-04-26 MED ORDER — BIOTENE DRY MOUTH MT LIQD
15.0000 mL | Freq: Four times a day (QID) | OROMUCOSAL | Status: DC
  Administered 2012-04-26 – 2012-05-02 (×25): 15 mL via OROMUCOSAL

## 2012-04-26 MED ORDER — ETOMIDATE 2 MG/ML IV SOLN
INTRAVENOUS | Status: DC | PRN
  Administered 2012-04-26: 14 mg via INTRAVENOUS

## 2012-04-26 MED ORDER — SODIUM CHLORIDE 0.9 % IR SOLN
Status: DC | PRN
  Administered 2012-04-26: 2000 mL

## 2012-04-26 MED ORDER — PROPOFOL 10 MG/ML IV EMUL
INTRAVENOUS | Status: AC
  Administered 2012-04-26: 1000 mg via INTRAVENOUS
  Filled 2012-04-26: qty 100

## 2012-04-26 MED ORDER — FENTANYL CITRATE 0.05 MG/ML IJ SOLN
INTRAMUSCULAR | Status: AC
  Filled 2012-04-26: qty 50

## 2012-04-26 MED ORDER — LACTATED RINGERS IV SOLN
INTRAVENOUS | Status: DC | PRN
  Administered 2012-04-26: 10:00:00 via INTRAVENOUS

## 2012-04-26 MED ORDER — DEXTROSE 5 % IV SOLN
INTRAVENOUS | Status: AC
  Filled 2012-04-26: qty 1

## 2012-04-26 MED ORDER — ETOMIDATE 2 MG/ML IV SOLN
INTRAVENOUS | Status: AC
  Filled 2012-04-26: qty 10

## 2012-04-26 MED ORDER — MIDAZOLAM HCL 5 MG/5ML IJ SOLN
INTRAMUSCULAR | Status: DC | PRN
  Administered 2012-04-26: 2 mg via INTRAVENOUS

## 2012-04-26 MED ORDER — PROPOFOL 10 MG/ML IV EMUL
5.0000 ug/kg/min | INTRAVENOUS | Status: DC
  Administered 2012-04-26: 10 ug/kg/min via INTRAVENOUS

## 2012-04-26 MED ORDER — FENTANYL CITRATE 0.05 MG/ML IJ SOLN
INTRAMUSCULAR | Status: AC
  Filled 2012-04-26: qty 5

## 2012-04-26 MED ORDER — PROPOFOL 10 MG/ML IV EMUL
INTRAVENOUS | Status: AC
  Filled 2012-04-26: qty 20

## 2012-04-26 MED ORDER — SUCCINYLCHOLINE CHLORIDE 20 MG/ML IJ SOLN
INTRAMUSCULAR | Status: DC | PRN
  Administered 2012-04-26: 120 mg via INTRAVENOUS

## 2012-04-26 MED ORDER — ONDANSETRON HCL 4 MG/2ML IJ SOLN
4.0000 mg | Freq: Once | INTRAMUSCULAR | Status: AC
  Administered 2012-04-26: 4 mg via INTRAVENOUS

## 2012-04-26 MED ORDER — FENTANYL CITRATE 0.05 MG/ML IJ SOLN
INTRAMUSCULAR | Status: DC | PRN
  Administered 2012-04-26 (×2): 100 ug via INTRAVENOUS
  Administered 2012-04-26: 150 ug via INTRAVENOUS
  Administered 2012-04-26 (×4): 100 ug via INTRAVENOUS

## 2012-04-26 MED ORDER — LIDOCAINE HCL (PF) 1 % IJ SOLN
INTRAMUSCULAR | Status: AC
  Filled 2012-04-26: qty 5

## 2012-04-26 MED ORDER — MIDAZOLAM HCL 2 MG/2ML IJ SOLN
1.0000 mg | INTRAMUSCULAR | Status: DC | PRN
  Administered 2012-04-26 (×2): 2 mg via INTRAVENOUS
  Filled 2012-04-26 (×2): qty 2

## 2012-04-26 MED ORDER — LACTATED RINGERS IV SOLN
INTRAVENOUS | Status: DC
  Administered 2012-04-26: 1000 mL via INTRAVENOUS

## 2012-04-26 SURGICAL SUPPLY — 52 items
APPLIER CLIP 11 MED OPEN (CLIP)
APPLIER CLIP 13 LRG OPEN (CLIP)
BAG HAMPER (MISCELLANEOUS) ×3 IMPLANT
BARRIER SKIN 2 3/4 (OSTOMY) IMPLANT
CELLS DAT CNTRL 66122 CELL SVR (MISCELLANEOUS) IMPLANT
CLAMP POUCH DRAINAGE QUIET (OSTOMY) IMPLANT
CLIP APPLIE 11 MED OPEN (CLIP) IMPLANT
CLIP APPLIE 13 LRG OPEN (CLIP) IMPLANT
CLOTH BEACON ORANGE TIMEOUT ST (SAFETY) ×3 IMPLANT
COVER LIGHT HANDLE STERIS (MISCELLANEOUS) ×6 IMPLANT
DRAPE WARM FLUID 44X44 (DRAPE) ×3 IMPLANT
DRSG TEGADERM 4X4.75 (GAUZE/BANDAGES/DRESSINGS) ×3 IMPLANT
DURAPREP 26ML APPLICATOR (WOUND CARE) ×3 IMPLANT
ELECT BLADE 6 FLAT ULTRCLN (ELECTRODE) IMPLANT
ELECT REM PT RETURN 9FT ADLT (ELECTROSURGICAL) ×3
ELECTRODE REM PT RTRN 9FT ADLT (ELECTROSURGICAL) ×2 IMPLANT
GLOVE BIOGEL PI IND STRL 7.5 (GLOVE) ×2 IMPLANT
GLOVE BIOGEL PI INDICATOR 7.5 (GLOVE) ×1
GLOVE ECLIPSE 7.0 STRL STRAW (GLOVE) ×3 IMPLANT
GOWN STRL REIN XL XLG (GOWN DISPOSABLE) ×12 IMPLANT
INST SET MAJOR GENERAL (KITS) ×3 IMPLANT
KIT CV MULTI LUMEN 7FRX20CM (SET/KITS/TRAYS/PACK) ×3 IMPLANT
KIT ROOM TURNOVER APOR (KITS) ×3 IMPLANT
MANIFOLD NEPTUNE II (INSTRUMENTS) ×3 IMPLANT
NS IRRIG 1000ML POUR BTL (IV SOLUTION) ×3 IMPLANT
PACK ABDOMINAL MAJOR (CUSTOM PROCEDURE TRAY) ×3 IMPLANT
PAD ABD 5X9 TENDERSORB (GAUZE/BANDAGES/DRESSINGS) ×3 IMPLANT
PAD ARMBOARD 7.5X6 YLW CONV (MISCELLANEOUS) ×3 IMPLANT
POUCH OSTOMY 2 3/4  H 3804 (WOUND CARE)
POUCH OSTOMY 2 PC DRNBL 2.75 (WOUND CARE) IMPLANT
RELOAD LINEAR CUT PROX 55 BLUE (ENDOMECHANICALS) IMPLANT
RELOAD PROXIMATE 75MM BLUE (ENDOMECHANICALS) IMPLANT
RETRACTOR WND ALEXIS 25 LRG (MISCELLANEOUS) IMPLANT
RTRCTR WOUND ALEXIS 18CM MED (MISCELLANEOUS)
RTRCTR WOUND ALEXIS 25CM LRG (MISCELLANEOUS)
SEALER TISSUE G2 CVD JAW 35 (ENDOMECHANICALS) IMPLANT
SEALER TISSUE G2 CVD JAW 45CM (ENDOMECHANICALS)
SET BASIN LINEN APH (SET/KITS/TRAYS/PACK) ×3 IMPLANT
SPONGE GAUZE 4X4 12PLY (GAUZE/BANDAGES/DRESSINGS) ×3 IMPLANT
STAPLER GUN LINEAR PROX 60 (STAPLE) IMPLANT
STAPLER PROXIMATE 55 BLUE (STAPLE) IMPLANT
STAPLER PROXIMATE 75MM BLUE (STAPLE) IMPLANT
STAPLER VISISTAT 35W (STAPLE) ×3 IMPLANT
SUCTION POOLE TIP (SUCTIONS) ×3 IMPLANT
SUT CHROMIC 0 SH (SUTURE) IMPLANT
SUT CHROMIC 2 0 SH (SUTURE) IMPLANT
SUT PDS AB 0 CTX 60 (SUTURE) ×6 IMPLANT
SUT SILK 2 0 (SUTURE)
SUT SILK 2-0 18XBRD TIE 12 (SUTURE) IMPLANT
SUT SILK 3 0 SH CR/8 (SUTURE) ×3 IMPLANT
TAPE CLOTH SURG 4X10 WHT LF (GAUZE/BANDAGES/DRESSINGS) ×3 IMPLANT
TRAY FOLEY CATH 14FR (SET/KITS/TRAYS/PACK) ×3 IMPLANT

## 2012-04-26 SURGICAL SUPPLY — 48 items
APPLIER CLIP 11 MED OPEN (CLIP)
APPLIER CLIP 13 LRG OPEN (CLIP)
BAG HAMPER (MISCELLANEOUS) IMPLANT
BARRIER SKIN 2 3/4 (OSTOMY) IMPLANT
CELLS DAT CNTRL 66122 CELL SVR (MISCELLANEOUS) IMPLANT
CLAMP POUCH DRAINAGE QUIET (OSTOMY) IMPLANT
CLIP APPLIE 11 MED OPEN (CLIP) IMPLANT
CLIP APPLIE 13 LRG OPEN (CLIP) IMPLANT
CLOTH BEACON ORANGE TIMEOUT ST (SAFETY) IMPLANT
COVER LIGHT HANDLE STERIS (MISCELLANEOUS) IMPLANT
DRAPE WARM FLUID 44X44 (DRAPE) IMPLANT
DURAPREP 26ML APPLICATOR (WOUND CARE) IMPLANT
ELECT BLADE 6 FLAT ULTRCLN (ELECTRODE) IMPLANT
ELECT REM PT RETURN 9FT ADLT (ELECTROSURGICAL)
ELECTRODE REM PT RTRN 9FT ADLT (ELECTROSURGICAL) IMPLANT
GLOVE BIOGEL PI IND STRL 7.5 (GLOVE) IMPLANT
GLOVE BIOGEL PI INDICATOR 7.5 (GLOVE)
GLOVE ECLIPSE 7.0 STRL STRAW (GLOVE) IMPLANT
GOWN STRL REIN XL XLG (GOWN DISPOSABLE) IMPLANT
INST SET MAJOR GENERAL (KITS) IMPLANT
KIT ROOM TURNOVER APOR (KITS) IMPLANT
MANIFOLD NEPTUNE II (INSTRUMENTS) IMPLANT
NS IRRIG 1000ML POUR BTL (IV SOLUTION) IMPLANT
PACK ABDOMINAL MAJOR (CUSTOM PROCEDURE TRAY) IMPLANT
PAD ARMBOARD 7.5X6 YLW CONV (MISCELLANEOUS) IMPLANT
POUCH OSTOMY 2 3/4  H 3804 (WOUND CARE)
POUCH OSTOMY 2 PC DRNBL 2.75 (WOUND CARE) IMPLANT
RELOAD LINEAR CUT PROX 55 BLUE (ENDOMECHANICALS) IMPLANT
RELOAD PROXIMATE 75MM BLUE (ENDOMECHANICALS) IMPLANT
RETRACTOR WND ALEXIS 25 LRG (MISCELLANEOUS) IMPLANT
RTRCTR WOUND ALEXIS 18CM MED (MISCELLANEOUS)
RTRCTR WOUND ALEXIS 25CM LRG (MISCELLANEOUS)
SEALER TISSUE G2 CVD JAW 35 (ENDOMECHANICALS) IMPLANT
SEALER TISSUE G2 CVD JAW 45CM (ENDOMECHANICALS)
SET BASIN LINEN APH (SET/KITS/TRAYS/PACK) IMPLANT
SPONGE GAUZE 4X4 12PLY (GAUZE/BANDAGES/DRESSINGS) IMPLANT
STAPLER GUN LINEAR PROX 60 (STAPLE) IMPLANT
STAPLER PROXIMATE 55 BLUE (STAPLE) IMPLANT
STAPLER PROXIMATE 75MM BLUE (STAPLE) IMPLANT
STAPLER VISISTAT 35W (STAPLE) IMPLANT
SUCTION POOLE TIP (SUCTIONS) IMPLANT
SUT CHROMIC 0 SH (SUTURE) IMPLANT
SUT CHROMIC 2 0 SH (SUTURE) IMPLANT
SUT PDS AB 0 CTX 60 (SUTURE) IMPLANT
SUT SILK 2 0 (SUTURE)
SUT SILK 2-0 18XBRD TIE 12 (SUTURE) IMPLANT
SUT SILK 3 0 SH CR/8 (SUTURE) IMPLANT
TRAY FOLEY CATH 14FR (SET/KITS/TRAYS/PACK) IMPLANT

## 2012-04-26 NOTE — Progress Notes (Signed)
Pt's vent alarming for high peak pressures with low tidal volumes. Pt was given Reglan and then Thorazine due to hiccups, prn sedation meds also given, pt suctioned with little secrections noted. RT at bedside.   Pt peak alarms still alarming after treatments. RT at bedside. Md notified and sedation meds changed to continues sedation. Will continue to monitor. Dr. Juanetta Gosling also made aware.

## 2012-04-26 NOTE — Progress Notes (Signed)
Subjective: Patient had exploratory laparotomy today. Currently he is orally intubated and on vent  Objective: Vital signs in last 24 hours: Temp:  [98.5 F (36.9 C)-99.7 F (37.6 C)] 98.5 F (36.9 C) (08/23 1200) Pulse Rate:  [61-113] 111  (08/23 1300) Resp:  [0-73] 12  (08/23 1300) BP: (122-187)/(69-108) 122/85 mmHg (08/23 1300) SpO2:  [93 %-100 %] 100 % (08/23 1300) FiO2 (%):  [100 %] 100 % (08/23 1158) Weight change:  Last BM Date: 04/22/12  Intake/Output from previous day: 08/22 0701 - 08/23 0700 In: 4006.7 [I.V.:3081.7; NG/GT:525; IV Piggyback:400] Out: 900 [Urine:900]  PHYSICAL EXAM General appearance: alert and cooperative Resp: diminished breath sounds bibasilar Cardio: S1, S2 normal GI: mild distension and active bowel sound, surgical site dressed Extremities: extremities normal, atraumatic, no cyanosis or edema  Lab Results:    @labtest @ [facility] No results found for this basename: PHART,PCO2,PO2ART,TCO2,HCO3 in the last 72 hours CULTURES Recent Results (from the past 240 hour(s))  SURGICAL PCR SCREEN     Status: Abnormal   Collection Time   04/25/12  7:59 PM      Component Value Range Status Comment   MRSA, PCR NEGATIVE  NEGATIVE Final    Staphylococcus aureus POSITIVE (*) NEGATIVE Final    Studies/Results: Dg Chest Portable 1 View  04/26/2012  *RADIOLOGY REPORT*  Clinical Data: Central line placement in the operating room.  PORTABLE CHEST - 1 VIEW  Comparison: 04/22/2012 and 11/13/2008.  Findings: 0934 hours.  Portions of the lower right chest are excluded from this portable radiograph.  Endotracheal tube tip is 3.3 cm above the carina.  A left subclavian central venous catheter has been placed.  This projects laterally in the right paratracheal region at the level of the azygos vein.  A nasogastric tube projects below the diaphragm.  There are low lung volumes with patchy perihilar opacities bilaterally.  No pneumothorax or significant pleural effusion is seen.   The heart size and mediastinal contours are stable.  There are multiple surgical clips at the thoracic inlet.  Convex right thoracic scoliosis is noted.  IMPRESSION:  1.  Left subclavian central venous catheter projects laterally and may abut the lateral wall of the SVC near the azygos vein. Correlate clinically. 2.  No pneumothorax. 3.  Patchy perihilar pulmonary opacities bilaterally.   Original Report Authenticated By: Gerrianne Scale, M.D.     Medications: I have reviewed the patient's current medications.  Assesment: 1. S/P exploratory laparotomy 2 . Small bowel obstruction 2.. Pneumonia 3.GERD  5. hiccup   Active Problems:  * No active hospital problems. *     Plan: Continue NG tube suctioning Continue Iv antibiotic Pulmonary consult Nebulizer treatment As per surgery plan  LOS: 4 days   Victor Hunter 04/26/2012, 1:19 PM

## 2012-04-26 NOTE — Progress Notes (Signed)
UR chart review completed.  

## 2012-04-26 NOTE — Progress Notes (Signed)
Pt RR in the 40's, continues sedation medication increased and patient repositioned in bed, breathing treatment given. RR still in 40's. RT at bedside. Spoke to MD at [facility]. ABG ordered and pain medication given.  At 1820 ABG results called to Dr. Sung Amabile at [facility]. Another dose of pain medication given. RT at bedside. Will continue to monitor.

## 2012-04-26 NOTE — Consult Note (Signed)
[id number] 

## 2012-04-26 NOTE — Anesthesia Procedure Notes (Signed)
Procedure Name: Intubation Date/Time: 04/26/2012 9:09 AM Performed by: Carolyne Littles, Kikue Gerhart L Pre-anesthesia Checklist: Patient identified, Patient being monitored, Timeout performed, Emergency Drugs available and Suction available Patient Re-evaluated:Patient Re-evaluated prior to inductionOxygen Delivery Method: Circle System Utilized Preoxygenation: Pre-oxygenation with 100% oxygen Intubation Type: IV induction, Rapid sequence and Cricoid Pressure applied Grade View: Grade I Tube type: Oral Tube size: 8.0 mm Number of attempts: 1 Airway Equipment and Method: stylet and Video-laryngoscopy Placement Confirmation: ETT inserted through vocal cords under direct vision,  positive ETCO2 and breath sounds checked- equal and bilateral Secured at: 21 cm Tube secured with: Tape Dental Injury: Teeth and Oropharynx as per pre-operative assessment  Comments: 8.0 Combitube

## 2012-04-26 NOTE — Op Note (Signed)
Patient:  Victor Hunter  DOB:  [birth year]  MRN:  [id number]   Preop Diagnosis:  Persistent small bowel obstruction  Postop Diagnosis:  Adynamic ileus  Procedure:  Exploratory laparotomy, cvc placement via a left subclavian vein approach  Surgeon:  Dr. Tilford Pillar  Anes:  General anesthesia via ET tube  Indications:  Patient is a 78 year old male presented to [facility] with abdominal pain nausea and persistent vomiting. He has failed to progress over the last several days and is examination is concerning for a persistent bowel obstruction. As patient appears to be resting correlate it was advised he undergo an exploratory laparotomy with possible bowel resection possible ostomy. Risks benefits and alternatives were discussed at length the patient and family. Additionally do to poor IV access risks benefits alternatives of placement of central line were also discussed. Consent was obtained for both procedures.  Procedure note:  Patient was taken to the operating room was placed in a supine position the operator table time the general static is a Optician, dispensing. Once patient was asleep he was endotracheally needed by the nurse anesthetist. At this point the central line is placed to to poor IV access. A left subclavian approach was undertaken. The site was prepped with chlorhexidine solution. Using an 18-gauge introducer needle subclavian vein was identified. Good venous return was obtained. A J-wire is advanced. Using standard sterile Seldinger technique a triple-lumen catheter was advanced to 19 cm. It was secured to the skin x3 for redundancy with silk suture. All 3 ports aspirated and flushed without difficulties. Sterile dressing was placed. Prior to proceeding with the operation a stat portable chest x-ray was obtained in the operating room in good firm but there is no evidence of any complications of placement of the central line.  A Foley catheter was then placed in standard sterile  fashion by myself. There was some difficulties initially attempting to intubate the urethral meatus. Gentle pressure did allow the catheter to advanced without difficulty. With the Foley catheter in place the patient was prepped and draped in standard fashion. A midline incision was created with a 10 blade scalpel with a left lateral keyhole defect around the umbilicus. Additional dissection down to subcuticular tissue including the fascia was carried out using electrocautery. The fascia is grasped with a Coker clamp and elevated. The peritoneum was entered into sharply. At this point the peritoneum was opened both superiorly and inferiorly. Inspection of the peritoneum temperature is some ascitic fluid. There were several loops of distended small intestine initially encountered. The bowel did appear viable and it was run in its entirety. Mother was some differentiation between proximal and distal bowel the bowel itself did not demonstrate any evidence of a stricture point. There is no adhesions to the anterior abdominal wall. There were seizures noted within the epigastric and right upper quadrant however these did not appear to be causing any stricturing or narrowing about obstructions. I did inspect the colon in its entirety. There is no palpable masses. The colon appeared viable 3 to entirety including the rectum. The pelvis was unremarkable. The bladder was palpated and was noted be decompressed and unremarkable. The spleen was palpable and unremarkable. The liver was palpated and with no nodularities or masses. The stomach was nondistended but the nasogastric tube noted to be in excellent position. He was resecured by the nurse anesthetist at this point. There are no other masses or abnormalities noted. At this point did we run the small intestine suture and not missed  any abnormal findings. Confirming there is no mechanical etiology of his symptomatology return my attention at this point to closure.  The  small bowel was returned into the abdominal cavity. The omentum was pulled back over the small intestine. The fascial edges were reapproximated using an 0 PDS looped suture x2. The initial was started inferiorly and brought to the midportion incision. A second started superiorly and brought to the first. These were secured to each other. The tails were cut through the subcuticular tissue. Skin edges were reapproximated using skin staples. The skin was washed dried moist dry towel. Sterile dressings were placed the drapes removed the dressings were secured. Patient will come out of general static however due to his slowly progressing pulmonary disease prior to proceeding the operating room it was elected that the patient should remain intubated. He will be transferred to the intensive care unit for continued monitoring and workup. At the conclusion of procedure all instrument, sponge, needle counts are correct. Patient tolerated procedure extremely well.  Complications:  None apparent  EBL:  Minimal  Specimen:  None, negative exploration

## 2012-04-26 NOTE — Progress Notes (Signed)
CRITICAL VALUE ALERT  Critical value received:  PH= 7.674, pCO2 19.3  Date of notification:  04/26/12  Time of notification:  1820  Critical value read back:yes  Nurse who received alert:  M. Kailynne Ferrington,RN  MD notified (1st page):  Dr.Simonds  Time of first page:  1820  MD notified (2nd page):  Time of second page:  Responding MD:  Dr. Sung Amabile  Time MD responded:  [phone]

## 2012-04-26 NOTE — Transfer of Care (Signed)
Immediate Anesthesia Transfer of Care Note  Patient: Victor Hunter  Procedure(s) Performed: Procedure(s) (LRB): EXPLORATORY LAPAROTOMY (N/A) INSERTION CENTRAL LINE ADULT (Left)  Patient Location: ICU  Anesthesia Type: General  Level of Consciousness:Sedated on vent  Airway & Oxygen Therapy: Remains intubated per anesthesia plan  Post-op Assessment: Report given to RN and Post -op Vital signs reviewed and stable  Post vital signs: Reviewed and stable  Complications: No apparent anesthesia complications

## 2012-04-26 NOTE — Progress Notes (Signed)
Day of Surgery  Subjective: No significant change of the last 24 hours. Still with no improve bowel function.  Objective: Vital signs in last 24 hours: Temp:  [98.8 F (37.1 C)-99.7 F (37.6 C)] 99.7 F (37.6 C) (08/23 0725) Pulse Rate:  [61-87] 61  (08/23 0529) Resp:  [18-58] 42  (08/23 0740) BP: (162-178)/(69-108) 171/108 mmHg (08/23 0740) SpO2:  [93 %-100 %] 100 % (08/23 0740) Last BM Date: 04/22/12  Intake/Output from previous day: 08/22 0701 - 08/23 0700 In: 4006.7 [I.V.:3081.7; NG/GT:525; IV Piggyback:400] Out: 900 [Urine:900] Intake/Output this shift:    Resp: Coarse breath sounds. Full bilaterally. GI: Quiet, soft, distended, mild to moderate tenderness. No peritoneal signs  Lab Results:   Basename 04/26/12 0450 04/25/12 0445  WBC 9.9 10.4  HGB 11.7* 12.0*  HCT 35.7* 37.3*  PLT 200 199   BMET  Basename 04/26/12 0450 04/25/12 0445  NA 141 140  K 3.2* 3.2*  CL 104 104  CO2 30 29  GLUCOSE 112* 129*  BUN 10 15  CREATININE 1.01 0.93  CALCIUM 7.6* 7.8*   PT/INR No results found for this basename: LABPROT:2,INR:2 in the last 72 hours ABG No results found for this basename: PHART:2,PCO2:2,PO2:2,HCO3:2 in the last 72 hours  Studies/Results: No results found.  Anti-infectives: Anti-infectives     Start     Dose/Rate Route Frequency Ordered Stop   04/25/12 1605   cefOXitin (MEFOXIN) 2 g in dextrose 5 % 50 mL IVPB        2 g 100 mL/hr over 30 Minutes Intravenous 60 min pre-op 04/25/12 1605     04/24/12 0930   azithromycin (ZITHROMAX) 500 mg in dextrose 5 % 250 mL IVPB  Status:  Discontinued        500 mg 250 mL/hr over 60 Minutes Intravenous Every 24 hours 04/24/12 0753 04/25/12 1251   04/24/12 0900   cefTRIAXone (ROCEPHIN) 1 g in dextrose 5 % 50 mL IVPB  Status:  Discontinued        1 g 100 mL/hr over 30 Minutes Intravenous Every 24 hours 04/24/12 0753 04/25/12 1252          Assessment/Plan: s/p Procedure(s) (LRB): EXPLORATORY LAPAROTOMY  (N/A) Persistent small bowel obstruction. We'll proceed to the operating room as discussed. T2 IV access we'll also plan to place a central line at the conclusion of procedure. Risks benefits and alternatives of the procedure were again discussed with the family. Due to patient's age and comorbid conditions we'll tan to place the patient in the intensive care unit for least the first 24 hours of postoperative monitoring. Family is aware  LOS: 4 days    Jaleel Allen C 04/26/2012

## 2012-04-26 NOTE — Preoperative (Signed)
Beta Blockers   Reason not to administer Beta Blockers:Not Applicable 

## 2012-04-26 NOTE — Consult Note (Signed)
NAMEDECKLAN, MAU NO.:  [id number]  MEDICAL RECORD NO.:  [id number]  LOCATION:  IC02                          FACILITY:  [facility]  PHYSICIAN:  Maanav Kassabian L. Juanetta Gosling, M.D.DATE OF BIRTH:  10-06-1933  DATE OF CONSULTATION: DATE OF DISCHARGE:                                CONSULTATION   REASON FOR CONSULTATION:  Respiratory failure.  HISTORY:  Mr. Gura is a 78 year old, African American male, who came to the hospital with what seemed to be a small bowel obstruction.  He underwent surgery today and he was brought to the intensive care unit and remained intubated.  He is sedated and intubated and unable to provide any history.  PAST MEDICAL HISTORY:  Positive for hypertension, GERD, hypothyroidism. He has had a thyroidectomy and a cholecystectomy and now has had an exploratory laparotomy.  His family history is positive for cancer in several family members and heart failure in several family members.  SOCIAL HISTORY:  He stopped smoking, but has about a 50-pack year smoking history.  He does not have a definitive diagnosis of COPD as best I can tell.  He does not use any alcohol or illicit drug use.  His review of systems is unobtainable.  PHYSICAL EXAMINATION:  GENERAL:  Shows that he is intubated and sedated and on the ventilator. HEENT:  His pupils are reactive.  Mucous membranes are dry. NECK:  Supple without masses. CHEST:  Relatively clear. HEART:  Regular without gallop. ABDOMEN:  Soft without masses.  He has hypoactive bowel sounds. EXTREMITIES:  Showed no edema. CENTRAL NERVOUS SYSTEM:  Really difficult to tell because he is intubated.  He is on 100% O2 still.  So my plan is to taper his oxygen, continue with his treatments.  He will be evaluated for potential weaning and extubation in the morning.     Caliana Spires L. Juanetta Gosling, M.D.     ELH/MEDQ  D:  04/26/2012  T:  04/26/2012  Job:  [id number]

## 2012-04-26 NOTE — Anesthesia Preprocedure Evaluation (Addendum)
Anesthesia Evaluation  Patient identified by MRN, date of birth, ID band Patient awake    Reviewed: Allergy & Precautions, H&P , NPO status , Patient's Chart, lab work & pertinent test results  History of Anesthesia Complications Negative for: history of anesthetic complications  Airway Mallampati: IV TM Distance: >3 FB Neck ROM: Full    Dental  (+) Edentulous Upper and Edentulous Lower   Pulmonary neg pulmonary ROS, shortness of breath (SOB today from distended bowels),    + decreased breath sounds      Cardiovascular hypertension, Pt. on medications Rhythm:Regular Rate:Normal     Neuro/Psych    GI/Hepatic PUD, GERD-  Medicated,  Endo/Other  Hypothyroidism Morbid obesity  Renal/GU      Musculoskeletal   Abdominal   Peds  Hematology   Anesthesia Other Findings   Reproductive/Obstetrics                           Anesthesia Physical Anesthesia Plan  ASA: III  Anesthesia Plan: General   Post-op Pain Management:    Induction: Intravenous, Rapid sequence and Cricoid pressure planned  Airway Management Planned: Oral ETT  Additional Equipment:   Intra-op Plan:   Post-operative Plan: Extubation in OR and Possible Post-op intubation/ventilation  Informed Consent: I have reviewed the patients History and Physical, chart, labs and discussed the procedure including the risks, benefits and alternatives for the proposed anesthesia with the patient or authorized representative who has indicated his/her understanding and acceptance.     Plan Discussed with:   Anesthesia Plan Comments: (Amidate induction. Possible post op vent support discussed with wife and she agrees.)        Anesthesia Quick Evaluation

## 2012-04-26 NOTE — Anesthesia Postprocedure Evaluation (Signed)
  Anesthesia Post-op Note  Patient: Victor Hunter  Procedure(s) Performed: Procedure(s) (LRB): EXPLORATORY LAPAROTOMY (N/A) INSERTION CENTRAL LINE ADULT (Left)  Patient Location ICU 2  Anesthesia Type: General  Level of Consciousness: Patient remains intubated per anesthesia plan  Patient remains intubated  Post-op Pain: none  Post-op Assessment: Post-op Vital signs reviewed, Patient's Cardiovascular Status Stable and Respiratory Function Stable  Post-op Vital Signs: Reviewed and stable  Complications: No apparent anesthesia complications

## 2012-04-26 NOTE — Progress Notes (Signed)
I have decreased pts tidal volume to 500 at beginning of shift as Pt was breath 35-40 times a minute -- his Ve was 20. He is fluid overloaded at this time. His sedation has been changed to fentanyl and diprovan. His resp rate is now 12 and with Ve 6.0. He is not breathing over the vent now. His lungs when bagging are stiff as he is hard to bag. Will repeat abg

## 2012-04-26 NOTE — Progress Notes (Signed)
INITIAL ADULT NUTRITION ASSESSMENT Date: 04/26/2012   Time: 4:26 PM Reason for Assessment: Mechanical Ventilation  ASSESSMENT: Male 78 y.o.  Dx: small bowel obstruction (Adynamic ileus)   Past Medical History  Diagnosis Date  . Hypertension   . GERD (gastroesophageal reflux disease)   . Thyroid disease   . Upper GI bleed   . Ileus   . Mallory - Weiss tear   . Chronic anemia   . Enlarged prostate     Elevated PSA  . 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 Opal Sidles benign biopsies February 2005  . History of BPH     s/p TURP 2008   Related Meds: Scheduled Meds:   . ipratropium  0.5 mg Nebulization Q4H   And  . albuterol  2.5 mg Nebulization Q4H  . antiseptic oral rinse  15 mL Mouth Rinse QID  . chlorhexidine  15 mL Mouth Rinse BID  . Chlorhexidine Gluconate Cloth  6 each Topical Daily  . enoxaparin (LOVENOX) injection  40 mg Subcutaneous Q24H  . enoxaparin (LOVENOX) injection  40 mg Subcutaneous 60 min Pre-Op  . glycopyrrolate  0.2 mg Intravenous Once  . mupirocin ointment  1 application Nasal BID  . ondansetron (ZOFRAN) IV  4 mg Intravenous Once  . pantoprazole (PROTONIX) IV  40 mg Intravenous QAC breakfast  . propofol      . sodium chloride      . sodium chloride      . DISCONTD: antiseptic oral rinse  15 mL Mouth Rinse BID  . DISCONTD: aspirin EC  81 mg Oral Daily  . DISCONTD: calcium carbonate  1 tablet Oral q morning - 10a  . DISCONTD: cefOXitin  2 g Intravenous 60 min Pre-Op  . DISCONTD: diltiazem  180 mg Oral Daily  . DISCONTD: levothyroxine  125 mcg Oral Daily  . DISCONTD: potassium chloride  40 mEq Oral BID  . DISCONTD: Tamsulosin HCl  0.4 mg Oral QPC supper   Continuous Infusions:   . dextrose 5 % and 0.9% NaCl 100 mL/hr at 04/26/12 1500  . propofol 10 mcg/kg/min (04/26/12 1500)  . DISCONTD: lactated ringers 1,000 mL (04/26/12 0759)   PRN Meds:.chlorproMAZINE (THORAZINE) IV,  HYDROmorphone (DILAUDID) injection, metoCLOPramide (REGLAN) injection, ondansetron, ondansetron (ZOFRAN) IV, DISCONTD: alum & mag hydroxide-simeth, DISCONTD: fentaNYL, DISCONTD: LORazepam, DISCONTD: midazolam, DISCONTD: midazolam, DISCONTD: sodium chloride irrigation, DISCONTD: zolpidem  Ht: 5\' 2"  (157.5 cm)  Wt: 180 lb (81.647 kg)  Ideal Wt: 54.6 kg % Ideal Wt: 149%  Usual Wt: 175-180# per daughter % Usual Wt: 100%  Body mass index is 32.92 kg/(m^2). Obesity Class I  Food/Nutrition Related Hx: Pt  s/p exploratory laparotomy today (negative). He is currently intubated on ventilator support. CVC placed left subclavian vein.  MV:6.6 ml Temp:Temp (24hrs), Avg:99.2 F (37.3 C), Min:98.5 F (36.9 C), Max:99.7 F (37.6 C)  Propofol: 4.6 ml/hr (providing 121 kcal lipids per day) IVF-D5 NS @ 100 ml/hr providing ~400 kcal/day dextrose  CMP     Component Value Date/Time   NA 141 04/26/2012 0450   K 3.2* 04/26/2012 0450   CL 104 04/26/2012 0450   CO2 30 04/26/2012 0450   GLUCOSE 112* 04/26/2012 0450   BUN 10 04/26/2012 0450   CREATININE 1.01 04/26/2012 0450   CALCIUM 7.6* 04/26/2012 0450   PROT 6.1 04/26/2012 0450   ALBUMIN 2.5* 04/26/2012 0450   AST 21 04/26/2012 0450   ALT 20 04/26/2012 0450   ALKPHOS  93 04/26/2012 0450   BILITOT 0.4 04/26/2012 0450   GFRNONAA 70* 04/26/2012 0450   GFRAA 81* 04/26/2012 0450   CBG (last 3)   Basename 04/26/12 1610 04/26/12 1144  GLUCAP 137* 102*    Intake/Output Summary (Last 24 hours) at 04/26/12 1632 Last data filed at 04/26/12 1500  Gross per 24 hour  Intake 6356.67 ml  Output    910 ml  Net 5446.67 ml    Diet Order:  NPO-d 4  Supplements/Tube Feeding:none at this time  IVF:    dextrose 5 % and 0.9% NaCl Last Rate: 100 mL/hr at 04/26/12 1500  propofol Last Rate: 10 mcg/kg/min (04/26/12 1500)  DISCONTD: lactated ringers Last Rate: 1,000 mL (04/26/12 0759)    Estimated Nutritional Needs:   XLKG:4010 kcal/day Protein:95-110  gr/day Fluid:2ml/kcal  NUTRITION DIAGNOSIS: -Inadequate oral intake (NI-2.1).  Status: Ongoing  RELATED TO: unable to eat  AS EVIDENCE BY: NPO   MONITORING/EVALUATION(Goals):  Monitor vent status, nutrition support measures  EDUCATION NEEDS: -Education not appropriate at this time  INTERVENTION:  If unable to wean and patient becomes appropriate for EN: Initiate Vital 1.2 ml/hr via NGT @  20 ml/hr and increase by 10 ml every 4 hours to goal rate of 40 ml/hr.  At goal rate, tube feeding regimen will provide 1152 kcal, 108 grams of protein, and of H2O.    D5 and propofol: additional 520 kcal per day at current rates.  RD will follow for nutrition needs.   Dietitian [phone]  DOCUMENTATION CODES Per approved criteria  -Obesity Unspecified    Francene Boyers 04/26/2012, 4:26 PM

## 2012-04-26 NOTE — Progress Notes (Signed)
ANTICOAGULATION CONSULT NOTE  Pharmacy Consult for Lovenox  Indication: VTE prophylaxis  No Known Allergies  Patient Measurements: Height: 5' (152.4 cm) Weight: 180 lb (81.647 kg) IBW/kg (Calculated) : 50    Vital Signs: Temp: 99.7 F (37.6 C) (08/23 0725) Temp src: Oral (08/23 0725) BP: 163/102 mmHg (08/23 0810) Pulse Rate: 61  (08/23 0529)  Labs:  Basename 04/26/12 0450 04/25/12 0445 04/24/12 0450  HGB 11.7* 12.0* --  HCT 35.7* 37.3* 38.5*  PLT 200 199 198  APTT -- -- --  LABPROT -- -- --  INR -- -- --  HEPARINUNFRC -- -- --  CREATININE 1.01 0.93 0.93  CKTOTAL -- -- --  CKMB -- -- --  TROPONINI -- -- --    Estimated Creatinine Clearance: 55.1 ml/min (by C-G formula based on Cr of 1.01).   Medical History: Past Medical History  Diagnosis Date  . Hypertension   . GERD (gastroesophageal reflux disease)   . Thyroid disease   . Upper GI bleed   . Ileus   . Mallory - Weiss tear   . Chronic anemia   . Enlarged prostate     Elevated PSA  . 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 Opal Sidles benign biopsies February 2005  . History of BPH     s/p TURP 2008    Medications:  Scheduled:     . antiseptic oral rinse  15 mL Mouth Rinse BID  . aspirin EC  81 mg Oral Daily  . calcium carbonate  1 tablet Oral q morning - 10a  . Chlorhexidine Gluconate Cloth  6 each Topical Daily  . diltiazem  180 mg Oral Daily  . enoxaparin (LOVENOX) injection  40 mg Subcutaneous Q24H  . enoxaparin (LOVENOX) injection  40 mg Subcutaneous 60 min Pre-Op  . glycopyrrolate  0.2 mg Intravenous Once  . levothyroxine  125 mcg Oral Daily  . mupirocin ointment  1 application Nasal BID  . ondansetron (ZOFRAN) IV  4 mg Intravenous Once  . pantoprazole (PROTONIX) IV  40 mg Intravenous QAC breakfast  . potassium chloride  10 mEq Intravenous Q1 Hr x 3  . potassium chloride  40 mEq Oral BID  . sodium chloride      .  Tamsulosin HCl  0.4 mg Oral QPC supper  . DISCONTD: azithromycin  500 mg Intravenous Q24H  . DISCONTD: cefOXitin  2 g Intravenous 60 min Pre-Op  . DISCONTD: cefTRIAXone (ROCEPHIN)  IV  1 g Intravenous Q24H    Assessment: 78 yo male to be started on lovenox for VTE prophylaxis. Platelet count and renal function within desired ranges and stable since admission.  Goal of Therapy:  Monitor platelets by anticoagulation protocol: Yes   Plan:  Lovenox 40 mg SQ Q24 hr Pharmacy to sign off- please re-consult as needed.   Elson Clan 04/26/2012,10:58 AM

## 2012-04-27 ENCOUNTER — Inpatient Hospital Stay (HOSPITAL_COMMUNITY): Payer: Medicare PPO

## 2012-04-27 DIAGNOSIS — J705 Respiratory conditions due to smoke inhalation: Secondary | ICD-10-CM

## 2012-04-27 DIAGNOSIS — E876 Hypokalemia: Secondary | ICD-10-CM | POA: Diagnosis present

## 2012-04-27 DIAGNOSIS — E873 Alkalosis: Secondary | ICD-10-CM | POA: Diagnosis present

## 2012-04-27 DIAGNOSIS — Z9889 Other specified postprocedural states: Secondary | ICD-10-CM

## 2012-04-27 DIAGNOSIS — K560 Paralytic ileus: Secondary | ICD-10-CM | POA: Diagnosis present

## 2012-04-27 DIAGNOSIS — J690 Pneumonitis due to inhalation of food and vomit: Secondary | ICD-10-CM | POA: Diagnosis present

## 2012-04-27 DIAGNOSIS — J9601 Acute respiratory failure with hypoxia: Secondary | ICD-10-CM | POA: Diagnosis present

## 2012-04-27 DIAGNOSIS — N179 Acute kidney failure, unspecified: Secondary | ICD-10-CM | POA: Diagnosis present

## 2012-04-27 DIAGNOSIS — J9600 Acute respiratory failure, unspecified whether with hypoxia or hypercapnia: Secondary | ICD-10-CM

## 2012-04-27 LAB — POCT I-STAT 3, ART BLOOD GAS (G3+)
Acid-Base Excess: 2 mmol/L (ref 0.0–2.0)
Acid-base deficit: 2 mmol/L (ref 0.0–2.0)
Bicarbonate: 24.2 mEq/L — ABNORMAL HIGH (ref 20.0–24.0)
Bicarbonate: 20.9 mEq/L (ref 20.0–24.0)
O2 Saturation: 98 %
TCO2: 26 mmol/L (ref 0–100)
pCO2 arterial: 18.1 mmHg — CL (ref 35.0–45.0)
pH, Arterial: 7.67 (ref 7.350–7.450)
pO2, Arterial: 113 mmHg — ABNORMAL HIGH (ref 80.0–100.0)
pO2, Arterial: 79 mmHg — ABNORMAL LOW (ref 80.0–100.0)

## 2012-04-27 LAB — BASIC METABOLIC PANEL
BUN: 14 mg/dL (ref 6–23)
Chloride: 107 mEq/L (ref 96–112)
GFR calc Af Amer: 54 mL/min — ABNORMAL LOW (ref >90)
GFR calc Af Amer: 56 mL/min — ABNORMAL LOW (ref >90)
GFR calc non Af Amer: 48 mL/min — ABNORMAL LOW (ref >90)
Potassium: 2.4 mEq/L — CL (ref 3.5–5.1)
Potassium: 2.9 mEq/L — ABNORMAL LOW (ref 3.5–5.1)
Sodium: 137 mEq/L (ref 135–145)

## 2012-04-27 LAB — CBC
HCT: 30.3 % — ABNORMAL LOW (ref 39.0–52.0)
Hemoglobin: 10.2 g/dL — ABNORMAL LOW (ref 13.0–17.0)
Hemoglobin: 10.8 g/dL — ABNORMAL LOW (ref 13.0–17.0)
MCHC: 32.6 g/dL (ref 30.0–36.0)
RDW: 15.4 % (ref 11.5–15.5)
RDW: 15.9 % — ABNORMAL HIGH (ref 11.5–15.5)
WBC: 10.8 10*3/uL — ABNORMAL HIGH (ref 4.0–10.5)
WBC: 14.2 10*3/uL — ABNORMAL HIGH (ref 4.0–10.5)

## 2012-04-27 LAB — CORTISOL: Cortisol, Plasma: 10.4 ug/dL

## 2012-04-27 LAB — URINALYSIS, ROUTINE W REFLEX MICROSCOPIC
Glucose, UA: NEGATIVE mg/dL
Specific Gravity, Urine: 1.025 (ref 1.005–1.030)
Urobilinogen, UA: 0.2 mg/dL (ref 0.0–1.0)
pH: 6 (ref 5.0–8.0)

## 2012-04-27 LAB — CREATININE, SERUM
Creatinine, Ser: 1.63 mg/dL — ABNORMAL HIGH (ref 0.50–1.35)
GFR calc Af Amer: 46 mL/min — ABNORMAL LOW (ref >90)
GFR calc non Af Amer: 39 mL/min — ABNORMAL LOW (ref >90)

## 2012-04-27 LAB — BLOOD GAS, ARTERIAL
FIO2: 0.5 %
MECHVT: 500 mL
TCO2: 22.2 mmol/L (ref 0–100)
pCO2 arterial: 17.1 mmHg — CL (ref 35.0–45.0)
pH, Arterial: 7.709 (ref 7.350–7.450)
pO2, Arterial: 58.2 mmHg — ABNORMAL LOW (ref 80.0–100.0)

## 2012-04-27 LAB — GLUCOSE, CAPILLARY
Glucose-Capillary: 127 mg/dL — ABNORMAL HIGH (ref 70–99)
Glucose-Capillary: 117 mg/dL — ABNORMAL HIGH (ref 70–99)
Glucose-Capillary: 102 mg/dL — ABNORMAL HIGH (ref 70–99)

## 2012-04-27 LAB — PROCALCITONIN: Procalcitonin: 0.52 ng/mL

## 2012-04-27 LAB — PROTIME-INR
INR: 1.24 (ref 0.00–1.49)
Prothrombin Time: 15.9 seconds — ABNORMAL HIGH (ref 11.6–15.2)

## 2012-04-27 LAB — STREP PNEUMONIAE URINARY ANTIGEN: Strep Pneumo Urinary Antigen: NEGATIVE

## 2012-04-27 LAB — URINE MICROSCOPIC-ADD ON

## 2012-04-27 MED ORDER — INSULIN ASPART 100 UNIT/ML ~~LOC~~ SOLN
0.0000 [IU] | SUBCUTANEOUS | Status: DC
  Administered 2012-04-27 – 2012-04-28 (×2): 1 [IU] via SUBCUTANEOUS
  Administered 2012-04-28 (×2): 3 [IU] via SUBCUTANEOUS
  Administered 2012-04-28: 1 [IU] via SUBCUTANEOUS
  Administered 2012-04-28 – 2012-04-29 (×4): 3 [IU] via SUBCUTANEOUS
  Administered 2012-04-29: 1 [IU] via SUBCUTANEOUS
  Administered 2012-04-29 – 2012-04-30 (×6): 3 [IU] via SUBCUTANEOUS
  Administered 2012-04-30: 1 [IU] via SUBCUTANEOUS
  Administered 2012-05-01: 3 [IU] via SUBCUTANEOUS
  Administered 2012-05-01 (×3): 1 [IU] via SUBCUTANEOUS

## 2012-04-27 MED ORDER — POTASSIUM CHLORIDE 10 MEQ/100ML IV SOLN
10.0000 meq | INTRAVENOUS | Status: AC
  Administered 2012-04-27 (×3): 10 meq via INTRAVENOUS
  Filled 2012-04-27: qty 200

## 2012-04-27 MED ORDER — LEVOTHYROXINE SODIUM 100 MCG IV SOLR
62.5000 ug | Freq: Every day | INTRAVENOUS | Status: DC
  Administered 2012-04-27: 3.1 ug via INTRAVENOUS
  Administered 2012-04-28 – 2012-05-02 (×5): 62.5 ug via INTRAVENOUS
  Filled 2012-04-27 (×7): qty 3.1

## 2012-04-27 MED ORDER — DEXTROSE 10 % IV SOLN: INTRAVENOUS | Status: DC | PRN

## 2012-04-27 MED ORDER — PANTOPRAZOLE SODIUM 40 MG IV SOLR: 40.0000 mg | Freq: Every day | INTRAVENOUS | Status: DC

## 2012-04-27 MED ORDER — METOCLOPRAMIDE HCL 5 MG/ML IJ SOLN
5.0000 mg | Freq: Four times a day (QID) | INTRAMUSCULAR | Status: DC
  Administered 2012-04-27 – 2012-05-03 (×22): 5 mg via INTRAVENOUS
  Filled 2012-04-27 (×27): qty 1

## 2012-04-27 MED ORDER — NOREPINEPHRINE BITARTRATE 1 MG/ML IJ SOLN
2.0000 ug/min | INTRAMUSCULAR | Status: DC
  Administered 2012-04-27: 5 ug/min via INTRAVENOUS
  Filled 2012-04-27: qty 4

## 2012-04-27 MED ORDER — VANCOMYCIN HCL 1000 MG IV SOLR
1500.0000 mg | Freq: Once | INTRAVENOUS | Status: AC
  Administered 2012-04-27: 1500 mg via INTRAVENOUS
  Filled 2012-04-27: qty 1500

## 2012-04-27 MED ORDER — POTASSIUM CHLORIDE 10 MEQ/100ML IV SOLN
INTRAVENOUS | Status: AC
  Administered 2012-04-27: 10 meq
  Filled 2012-04-27: qty 200

## 2012-04-27 MED ORDER — CLINIMIX E/DEXTROSE (5/15) 5 % IV SOLN
INTRAVENOUS | Status: AC
  Administered 2012-04-27: 18:00:00 via INTRAVENOUS
  Filled 2012-04-27: qty 1000

## 2012-04-27 MED ORDER — SODIUM CHLORIDE 0.9 % IV SOLN
INTRAVENOUS | Status: DC
  Administered 2012-04-27: 14:00:00 via INTRAVENOUS

## 2012-04-27 MED ORDER — POTASSIUM CHLORIDE IN NACL 20-0.9 MEQ/L-% IV SOLN
INTRAVENOUS | Status: DC
  Administered 2012-04-27: 15:00:00 via INTRAVENOUS
  Filled 2012-04-27 (×2): qty 1000

## 2012-04-27 MED ORDER — PROPOFOL 10 MG/ML IV EMUL: 5.0000 ug/kg/min | INTRAVENOUS | Status: DC

## 2012-04-27 MED ORDER — IPRATROPIUM BROMIDE 0.02 % IN SOLN
0.5000 mg | Freq: Four times a day (QID) | RESPIRATORY_TRACT | Status: DC
  Administered 2012-04-27 – 2012-04-28 (×6): 0.5 mg via RESPIRATORY_TRACT
  Filled 2012-04-27 (×6): qty 2.5

## 2012-04-27 MED ORDER — LEVOFLOXACIN IN D5W 750 MG/150ML IV SOLN
750.0000 mg | INTRAVENOUS | Status: DC
  Administered 2012-04-27: 750 mg via INTRAVENOUS
  Filled 2012-04-27 (×2): qty 150

## 2012-04-27 MED ORDER — SODIUM CHLORIDE 0.9 % IV SOLN: 750.0000 mL | INTRAVENOUS | Status: DC | PRN

## 2012-04-27 MED ORDER — SODIUM CHLORIDE 0.9 % IV SOLN
25.0000 ug/h | INTRAVENOUS | Status: DC
  Administered 2012-04-27: 100 ug/h via INTRAVENOUS
  Filled 2012-04-27: qty 50

## 2012-04-27 MED ORDER — HEPARIN SODIUM (PORCINE) 5000 UNIT/ML IJ SOLN
5000.0000 [IU] | Freq: Three times a day (TID) | INTRAMUSCULAR | Status: DC
  Administered 2012-04-27 – 2012-05-03 (×19): 5000 [IU] via SUBCUTANEOUS
  Filled 2012-04-27 (×21): qty 1

## 2012-04-27 MED ORDER — SODIUM CHLORIDE 0.9 % IV SOLN
250.0000 mL | INTRAVENOUS | Status: DC | PRN
  Administered 2012-05-01: 20 mL/h via INTRAVENOUS

## 2012-04-27 MED ORDER — PIPERACILLIN-TAZOBACTAM 3.375 G IVPB
3.3750 g | Freq: Three times a day (TID) | INTRAVENOUS | Status: DC
  Administered 2012-04-27 – 2012-04-29 (×6): 3.375 g via INTRAVENOUS
  Filled 2012-04-27 (×8): qty 50

## 2012-04-27 MED ORDER — ALBUTEROL SULFATE (5 MG/ML) 0.5% IN NEBU
2.5000 mg | INHALATION_SOLUTION | RESPIRATORY_TRACT | Status: DC
  Administered 2012-04-27 – 2012-04-28 (×9): 2.5 mg via RESPIRATORY_TRACT
  Filled 2012-04-27 (×9): qty 0.5

## 2012-04-27 MED ORDER — SODIUM CHLORIDE 0.9 % IJ SOLN
INTRAMUSCULAR | Status: AC
  Administered 2012-04-27: 30 mL
  Filled 2012-04-27: qty 3

## 2012-04-27 MED ORDER — FENTANYL CITRATE 0.05 MG/ML IJ SOLN
25.0000 ug | INTRAMUSCULAR | Status: DC | PRN
  Administered 2012-04-27 – 2012-04-30 (×2): 50 ug via INTRAVENOUS
  Filled 2012-04-27 (×2): qty 2

## 2012-04-27 MED ORDER — POTASSIUM CHLORIDE 10 MEQ/50ML IV SOLN: 10.0000 meq | INTRAVENOUS | Status: DC

## 2012-04-27 MED ORDER — FENTANYL CITRATE 0.05 MG/ML IJ SOLN
INTRAMUSCULAR | Status: AC
  Filled 2012-04-27: qty 50

## 2012-04-27 MED ORDER — SODIUM CHLORIDE 0.9 % IJ SOLN: 10.0000 mL | INTRAMUSCULAR | Status: DC | PRN

## 2012-04-27 MED ORDER — SODIUM CHLORIDE 0.9 % IJ SOLN
10.0000 mL | Freq: Two times a day (BID) | INTRAMUSCULAR | Status: DC
  Administered 2012-04-27: 30 mL

## 2012-04-27 NOTE — Progress Notes (Signed)
Pt to be transferred to [facility] per MD order. Pts daughter Inetta Fermo, [place], and pts wife aware of transfer and consent to transfer. Report called to [employer]. Pts family members at bedside.

## 2012-04-27 NOTE — Progress Notes (Signed)
PARENTERAL NUTRITION CONSULT NOTE - INITIAL  Pharmacy Consult for TPN  Indication: Adynamic ileus  No Known Allergies  Patient Measurements: Height: 5\' 2"  (157.5 cm) Weight: 195 lb 15.8 oz (88.9 kg) IBW/kg (Calculated) : 54.6  Adjusted Body Weight: 60Kg  Vital Signs: Temp: 99.2 F (37.3 C) (08/24 0800) Temp src: Axillary (08/24 0800) BP: 83/45 mmHg (08/24 1115) Pulse Rate: 112  (08/24 1230) Intake/Output from previous day: 08/23 0701 - 08/24 0700 In: 4697.7 [I.V.:4697.7] Out: 685 [Urine:500; Emesis/NG output:175; Blood:10] Intake/Output from this shift: Total I/O In: 769.5 [I.V.:669.5; IV Piggyback:100] Out: 150 [Urine:50; Emesis/NG output:100]  Labs:  [facility] 04/27/12 0900 04/26/12 0450 04/25/12 0445  WBC 10.8* 9.9 10.4  HGB 10.2* 11.7* 12.0*  HCT 30.3* 35.7* 37.3*  PLT 175 200 199  APTT -- -- --  INR -- -- --     Basename 04/27/12 0900 04/26/12 0450 04/25/12 0445  NA 139 141 140  K 2.4* 3.2* 3.2*  CL 107 104 104  CO2 24 30 29   GLUCOSE 125* 112* 129*  BUN 14 10 15   CREATININE 1.42* 1.01 0.93  LABCREA -- -- --  CREAT24HRUR -- -- --  CALCIUM 6.8* 7.6* 7.8*  MG -- -- --  PHOS -- -- --  PROT -- 6.1 --  ALBUMIN -- 2.5* --  AST -- 21 --  ALT -- 20 --  ALKPHOS -- 93 --  BILITOT -- 0.4 --  BILIDIR -- -- --  IBILI -- -- --  PREALBUMIN -- -- --  TRIG -- -- --  CHOLHDL -- -- --  CHOL -- -- --   Estimated Creatinine Clearance: 42.8 ml/min (by C-G formula based on Cr of 1.42).    Basename 04/27/12 1037 04/27/12 0336 04/26/12 2353  GLUCAP 116* 127* 146*    Medical History: Past Medical History  Diagnosis Date  . Hypertension   . GERD (gastroesophageal reflux disease)   . Thyroid disease   . Upper GI bleed   . Ileus   . Mallory - Weiss tear   . Chronic anemia   . Enlarged prostate     Elevated PSA  . 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 Opal Sidles benign biopsies February  2005  . History of BPH     s/p TURP 2008    Medications:  Prescriptions prior to admission  Medication Sig Dispense Refill  . aspirin EC 81 MG tablet Take 81 mg by mouth daily.      . calcium carbonate (TUMS - DOSED IN MG ELEMENTAL CALCIUM) 500 MG chewable tablet Chew 1 tablet by mouth daily as needed. Acid Reflux      . diltiazem (DILACOR XR) 180 MG 24 hr capsule Take 180 mg by mouth daily.      . hydrochlorothiazide (HYDRODIURIL) 25 MG tablet Take 25 mg by mouth daily.      Marland Kitchen levothyroxine (SYNTHROID, LEVOTHROID) 125 MCG tablet Take 125 mcg by mouth daily.      . potassium chloride SA (K-DUR,KLOR-CON) 20 MEQ tablet Take 20 mEq by mouth daily.      . Tamsulosin HCl (FLOMAX) 0.4 MG CAPS Take 0.4 mg by mouth daily after supper.        Insulin Requirements in the past 24 hours:  None  Current Nutrition:  NPO  Assessment: Patient admitted to [facility] with epigastric pain, chest discomfort and abd distention. He is now s/p exp lap (8/23), findings consistent with adynamic ileus, no  obstruction found.   GI: NPO since admit 8/20, noted NGT in place, with ongoing output. No recent BM, pt cont on Reglan. Suspect moderate refeeding risk. Endo: No prior hx of DM, CBG controlled, <150- currently on SSI q 4h (ICU HG protocol). No TSH check this admit. Lytes: K persistently low, noted ongoing replacement over the past few days.Other lytes ok, Ca low at 6.8 (alb is 4.2).  Renal: Noted Scr increased from baseline- dehydration vs. Hypoperfusion. UOP is low (0.2 ml/kg/hr) Pulm: 0.70 Fio2- pt sent to [facility] for tachypnea post-op Cards: MAP < 65, noted pressors ordered. Hepatobil: LFTs WNL at baseline. Alb 4.2- chronic nutritional status nml Neuro: GCS 9, RASS -2; propofol for sedation currently infusin at 63ml/hr (if remains at this rate, will provide ~550 Kcal (from fat)). ID: Febrile, WBC incr. Noted empiric abx ordered Azithromycin 8/21 >>8/22 Ceftriaxone 8/21 >> 8/22 Cefoxitin 8/22 x 1 dose  pre-op Vanc 8/24 >> Zosyn 8/24 >> Levaquin 8/24 >>  8/22: MRSA PCR neg 8/24: trach asp: 8/24: Blood:  Best Practices: SQ hep, oral care Central access: CVC (triple lumen)- placed 8/23  Nutritional Goals (per RD):  1497 kCal, 95-110 grams of protein per day  Plan:  - Will start Clinimix E 5/15 at 40cc/hr, will slowly titrate to goal rate of 83cc/hr based on patient tolerance.  - Will supplment MVI, select trace elements and IV lipids at 10cc/hr on MWF only d/t national shortages - Will give 4 additional runs of KCL today - Will add 20 mEq of KCL to each liter of MIVF and continue to infuse at 26ml/hr - TPN labs in AM - Will continue to monitor propofol requirement daily and adjust TPN Kcals accordingly. Will monitor Triglycerides closely while pt on TPN  Thanks, Kewana Sanon K. Allena Katz, PharmD, BCPS.  Clinical Pharmacist Pager [phone]. 04/27/2012 2:32 PM

## 2012-04-27 NOTE — Progress Notes (Signed)
Subjective: He is still intubated and on a ventilator. He has episodes of very rapid respiration that are of unknown cause. He is currently breathing at about the rate of the ventilator but earlier he was breathing 40 times over the ventilator rate. There is no obvious cause of all this but he has some lab work that is still pending. It may be a CNS problem. His family is requesting transfer to critical care in [place] because they all live in [place]  Objective: Vital signs in last 24 hours: Temp:  [98.5 F (36.9 C)-100.6 F (38.1 C)] 100.5 F (38.1 C) (08/24 0400) Pulse Rate:  [54-118] 74  (08/24 0845) Resp:  [10-41] 12  (08/24 0845) BP: (71-187)/(42-145) 86/51 mmHg (08/24 0845) SpO2:  [96 %-100 %] 100 % (08/24 0845) FiO2 (%):  [50 %-100 %] 80 % (08/24 0737) Weight:  [88.9 kg (195 lb 15.8 oz)] 88.9 kg (195 lb 15.8 oz) (08/24 0500) Weight change:  Last BM Date: 04/22/12  Intake/Output from previous day: 08/23 0701 - 08/24 0700 In: 4697.7 [I.V.:4697.7] Out: 685 [Urine:500; Emesis/NG output:175; Blood:10]  PHYSICAL EXAM General appearance: Intubated sedated and not responsive right now. As mentioned he is breathing at the ventilator rate right now Resp: rhonchi bilaterally Cardio: regular rate and rhythm, S1, S2 normal, no murmur, click, rub or gallop GI: soft, non-tender; bowel sounds normal; no masses,  no organomegaly Extremities: extremities normal, atraumatic, no cyanosis or edema  Lab Results:    Basic Metabolic Panel:  Basename 04/26/12 0450 04/25/12 0445  NA 141 140  K 3.2* 3.2*  CL 104 104  CO2 30 29  GLUCOSE 112* 129*  BUN 10 15  CREATININE 1.01 0.93  CALCIUM 7.6* 7.8*  MG -- --  PHOS -- --   Liver Function Tests:  Basename 04/26/12 0450  AST 21  ALT 20  ALKPHOS 93  BILITOT 0.4  PROT 6.1  ALBUMIN 2.5*   No results found for this basename: LIPASE:2,AMYLASE:2 in the last 72 hours No results found for this basename: AMMONIA:2 in the last 72  hours CBC:  Basename 04/26/12 0450 04/25/12 0445  WBC 9.9 10.4  NEUTROABS 7.3 --  HGB 11.7* 12.0*  HCT 35.7* 37.3*  MCV 79.2 78.7  PLT 200 199   Cardiac Enzymes: No results found for this basename: CKTOTAL:3,CKMB:3,CKMBINDEX:3,TROPONINI:3 in the last 72 hours BNP: No results found for this basename: PROBNP:3 in the last 72 hours D-Dimer: No results found for this basename: DDIMER:2 in the last 72 hours CBG:  Basename 04/27/12 0336 04/26/12 2353 04/26/12 2020 04/26/12 1808 04/26/12 1610 04/26/12 1144  GLUCAP 127* 146* 118* 139* 137* 102*   Hemoglobin A1C: No results found for this basename: HGBA1C in the last 72 hours Fasting Lipid Panel: No results found for this basename: CHOL,HDL,LDLCALC,TRIG,CHOLHDL,LDLDIRECT in the last 72 hours Thyroid Function Tests: No results found for this basename: TSH,T4TOTAL,FREET4,T3FREE,THYROIDAB in the last 72 hours Anemia Panel: No results found for this basename: VITAMINB12,FOLATE,FERRITIN,TIBC,IRON,RETICCTPCT in the last 72 hours Coagulation: No results found for this basename: LABPROT:2,INR:2 in the last 72 hours Urine Drug Screen: Drugs of Abuse  No results found for this basename: labopia, cocainscrnur, labbenz, amphetmu, thcu, labbarb    Alcohol Level: No results found for this basename: ETH:2 in the last 72 hours Urinalysis: No results found for this basename: COLORURINE:2,APPERANCEUR:2,LABSPEC:2,PHURINE:2,GLUCOSEU:2,HGBUR:2,BILIRUBINUR:2,KETONESUR:2,PROTEINUR:2,UROBILINOGEN:2,NITRITE:2,LEUKOCYTESUR:2 in the last 72 hours Misc. Labs:  ABGS  Basename 04/27/12 0421  PHART 7.709*  PO2ART 58.2*  TCO2 22.2  HCO3 22.2   CULTURES Recent Results (from  the past 240 hour(s))  SURGICAL PCR SCREEN     Status: Abnormal   Collection Time   04/25/12  7:59 PM      Component Value Range Status Comment   MRSA, PCR NEGATIVE  NEGATIVE Final    Staphylococcus aureus POSITIVE (*) NEGATIVE Final    Studies/Results: Dg Chest Port 1  View  04/27/2012  *RADIOLOGY REPORT*  Clinical Data: Post colon surgery and ventilated.  PORTABLE CHEST - 1 VIEW  Comparison: 04/26/2012  Findings: Endotracheal tube is 2.8 cm above the carina.  Persistent patchy airspace densities in the left lung are unchanged.  Few densities in the right hilum are unchanged.  Nasogastric tube extends into the upper stomach.  There is scoliosis of the thoracic spine.  Left subclavian central line tip in upper SVC region. There appears to be contrast material within the colon.  IMPRESSION: Persistent airspace disease in the left lung is concerning for infection.  These findings have minimally changed.  Support apparatuses as described.   Original Report Authenticated By: Richarda Overlie, M.D.    Dg Chest Port 1 View  04/26/2012  *RADIOLOGY REPORT*  Clinical Data: 78 year old male with increased RR interval.  PORTABLE CHEST - 1 VIEW  Comparison: 0934 hours the same day and earlier.  Findings: Upright portable AP view at 9-11 hours.  Stable endotracheal tube tip.  Enteric tube courses to the abdomen, tip not included.  Stable left subclavian line.  Continued low lung volumes.  Continued patchy opacity in the left mid and lower lung. Stable cardiac size and mediastinal contours.  No pneumothorax. Increased atelectasis along the right minor fissure.  No large effusion.  IMPRESSION: 1. Stable lines and tubes. 2.  Continued low lung volumes with left lung airspace disease suggestive of pneumonia.   Original Report Authenticated By: Harley Hallmark, M.D.    Dg Chest Portable 1 View  04/26/2012  *RADIOLOGY REPORT*  Clinical Data: Central line placement in the operating room.  PORTABLE CHEST - 1 VIEW  Comparison: 04/22/2012 and 11/13/2008.  Findings: 0934 hours.  Portions of the lower right chest are excluded from this portable radiograph.  Endotracheal tube tip is 3.3 cm above the carina.  A left subclavian central venous catheter has been placed.  This projects laterally in the right  paratracheal region at the level of the azygos vein.  A nasogastric tube projects below the diaphragm.  There are low lung volumes with patchy perihilar opacities bilaterally.  No pneumothorax or significant pleural effusion is seen.  The heart size and mediastinal contours are stable.  There are multiple surgical clips at the thoracic inlet.  Convex right thoracic scoliosis is noted.  IMPRESSION:  1.  Left subclavian central venous catheter projects laterally and may abut the lateral wall of the SVC near the azygos vein. Correlate clinically. 2.  No pneumothorax. 3.  Patchy perihilar pulmonary opacities bilaterally.   Original Report Authenticated By: Gerrianne Scale, M.D.     Medications:  Scheduled:   . ipratropium  0.5 mg Nebulization Q4H   And  . albuterol  2.5 mg Nebulization Q4H  . antiseptic oral rinse  15 mL Mouth Rinse QID  . chlorhexidine  15 mL Mouth Rinse BID  . Chlorhexidine Gluconate Cloth  6 each Topical Daily  . enoxaparin (LOVENOX) injection  40 mg Subcutaneous Q24H  . mupirocin ointment  1 application Nasal BID  . pantoprazole (PROTONIX) IV  40 mg Intravenous QAC breakfast  . propofol      .  sodium chloride      . sodium chloride      . DISCONTD: antiseptic oral rinse  15 mL Mouth Rinse BID  . DISCONTD: aspirin EC  81 mg Oral Daily  . DISCONTD: calcium carbonate  1 tablet Oral q morning - 10a  . DISCONTD: cefOXitin  2 g Intravenous 60 min Pre-Op  . DISCONTD: diltiazem  180 mg Oral Daily  . DISCONTD: levothyroxine  125 mcg Oral Daily  . DISCONTD: potassium chloride  40 mEq Oral BID  . DISCONTD: Tamsulosin HCl  0.4 mg Oral QPC supper   Continuous:   . dextrose 5 % and 0.9% NaCl 100 mL/hr at 04/27/12 0800  . fentaNYL infusion INTRAVENOUS 400 mcg/hr (04/27/12 0800)  . propofol 60 mcg/kg/min (04/27/12 0856)  . DISCONTD: lactated ringers 1,000 mL (04/26/12 0759)  . DISCONTD: propofol 40 mcg/kg/min (04/26/12 1921)  . DISCONTD: propofol 20 mcg/kg/min (04/26/12 2109)    ZHY:QMVHQIONGEXBMW (THORAZINE) IV, HYDROmorphone (DILAUDID) injection, metoCLOPramide (REGLAN) injection, ondansetron, ondansetron (ZOFRAN) IV, DISCONTD: alum & mag hydroxide-simeth, DISCONTD: fentaNYL, DISCONTD: LORazepam, DISCONTD: midazolam, DISCONTD: midazolam, DISCONTD: sodium chloride irrigation, DISCONTD: zolpidem  Assesment: He has respiratory failure. He had surgery for what was thought to be a small bowel obstruction but small bowel obstruction was not identified. He has some sort of respiratory problem with markedly increased respiratory rate which is unexplained at this point. I am concerned that he may have some sort of a CNS issue causing that. He is malnourished. Active Problems:  * No active hospital problems. *     Plan: I discussed his situation with his daughter and as mentioned she requested he be transferred to [facility]    LOS: 5 days   Charon Smedberg L 04/27/2012, 9:20 AM

## 2012-04-27 NOTE — H&P (Signed)
Name: Victor Hunter MRN: [id number] DOB: 07-19-1934    LOS: 5  Referring Provider:  Juanetta Gosling Reason for Referral:  resp failure   PULMONARY / CRITICAL CARE MEDICINE  HPI:   78 year old male who was admitted on 8/19 w/ CC: epigastric and substernal discomfort. Dx eval was consistent w/ what was felt to be an SBO.  This was initially followed clinically. His course was complicated by what was likely an aspiration vs preexisting community acquired  PNA .  His abd remained distended so he was brought to the OR on 8/23 for exploration. OR findings were consistent with adynamic ileus, but no mechanical obstructions were identified.  He returned to the ICU post-op on 8/23. Post-op  on 8/24 pt was tachypnic and unable to be weaned. He was transferred to [facility] on 8/24 for further evaluation.  Past Medical History  Diagnosis Date  . Hypertension   . GERD (gastroesophageal reflux disease)   . Thyroid disease   . Upper GI bleed   . Ileus   . Mallory - Weiss tear   . Chronic anemia   . Enlarged prostate     Elevated PSA  . 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 Opal Sidles benign biopsies February 2005  . History of BPH     s/p TURP 2008   Past Surgical History  Procedure Date  . Thyroidectomy   . Cholecystectomy   . Esophagogastroduodenoscopy 08/31/08    Rourk-possible extrinsic impression on midesophagus, noncritical Schatzki's ring, 3 mm fundal gland polyp, multiple gastric fundic polyps not manipulated, small hiatal hernia  . Colonoscopy 10/29/03    rourk-inflammatory polyp from 40 cm removed  . Knee arthroscopy     pt denies  . Transurethral resection of prostate     pt denies, but this is listed in his past medical records.    Prior to Admission medications   Medication Sig Start Date End Date Taking? Authorizing Provider  aspirin EC 81 MG tablet Take 81 mg by mouth daily.   Yes Historical Provider, MD    calcium carbonate (TUMS - DOSED IN MG ELEMENTAL CALCIUM) 500 MG chewable tablet Chew 1 tablet by mouth daily as needed. Acid Reflux   Yes Historical Provider, MD  diltiazem (DILACOR XR) 180 MG 24 hr capsule Take 180 mg by mouth daily.   Yes Historical Provider, MD  hydrochlorothiazide (HYDRODIURIL) 25 MG tablet Take 25 mg by mouth daily.   Yes Historical Provider, MD  levothyroxine (SYNTHROID, LEVOTHROID) 125 MCG tablet Take 125 mcg by mouth daily.   Yes Historical Provider, MD  potassium chloride SA (K-DUR,KLOR-CON) 20 MEQ tablet Take 20 mEq by mouth daily.   Yes Historical Provider, MD  Tamsulosin HCl (FLOMAX) 0.4 MG CAPS Take 0.4 mg by mouth daily after supper.   Yes Historical Provider, MD   Allergies No Known Allergies  Family History Family History  Problem Relation Age of Onset  . Cancer Other   . Heart failure Other   . Liver disease Sister   . Breast cancer Sister   . Hypertension Mother   . Stroke Father   . Colon cancer Neg Hx    Social History  reports that he has quit smoking. His smoking use included Cigarettes. He smoked .5 packs per day. He has never used smokeless tobacco. He reports that he does not drink alcohol or use illicit drugs.  Review Of Systems:  Unable   Brief  patient description:    78 year old male admitted to [facility] on 8/19 w/ SBO. Pre-op course complicated by CAP vs aspiration. Went to OR on 8/23, Findings: Adynamic ileus. On 8/24 unable to wean so transferred to [facility] for further eval.    Current Status: Sedated on vent   Vital Signs: Temp:  [99.2 F (37.3 C)-100.6 F (38.1 C)] 99.2 F (37.3 C) (08/24 0800) Pulse Rate:  [54-118] 112  (08/24 1230) Resp:  [10-41] 15  (08/24 1230) BP: (71-160)/(42-145) 83/45 mmHg (08/24 1115) SpO2:  [96 %-100 %] 100 % (08/24 1320) FiO2 (%):  [50 %-100 %] 70 % (08/24 1320) Weight:  [88.9 kg (195 lb 15.8 oz)] 88.9 kg (195 lb 15.8 oz) (08/24 0500)  Physical Examination: General:  Obese 78 year old aam. Currently  sedated on full vent support.  Neuro:  Sedated.  HEENT:  Orally intubated no clear JVD Neck:  large Cardiovascular:  rrr Lungs:  Prolonged exp phase, occ rhonchi  Abdomen:  Distended. Hypoactive. NGT in place.  Musculoskeletal:  intact Skin:  + dependant edema  GU: foley cath, with clear urine.   Active Problems:  Acute respiratory failure  Respiratory alkalosis  Aspiration pneumonia  Hypokalemia  Acute renal failure  S/P exploratory laparotomy  Adynamic ileus   ASSESSMENT AND PLAN  PULMONARY  Lab 04/27/12 1340 04/27/12 0421 04/26/12 2243 04/26/12 1805 04/26/12 1600  PHART 7.335* 7.709* 7.446 7.674* 7.423  PCO2ART 45.4* 17.1* 37.0 19.3* 41.3  PO2ART 113.0* 58.2* 320.0* 205.0* 250.0*  HCO3 24.2* 22.2 25.1* 23.0 26.5*  O2SAT 98.0 95.8 99.5 99.8 99.6   Ventilator Settings: Vent Mode:  [-] PRVC FiO2 (%):  [50 %-100 %] 70 % Set Rate:  [12 bmp] 12 bmp Vt Set:  [500 mL] 500 mL PEEP:  [5 cmH20] 5 cmH20 Plateau Pressure:  [22 cmH20-28 cmH20] 22 cmH20 CXR:  ETT good position. Left CVL in good position. Left sided Airspace disease  ETT:  8/23>>>  1) Post-op acute respiratory failure 2) Aspiration PNA 3) Respiratory alkalosis Post-op respiratory failure. Likely multifactorial. PNA, pain, hypoxia.  P:   Full vent support F/u abg Sedation protocol w/ diprivan  Scheduled nebs See ID section   CARDIOVASCULAR  Lab 04/22/12 1319 04/22/12 0859  TROPONINI <0.30 <0.30  LATICACIDVEN -- --  PROBNP -- --   ECG:   Lines: left subclavian CVL 8/23 at ap in OR>>>  1)  Hypotension/ sirs. Septic shock: not sure if this is sedation related. Can't exclude sepsis, but doesn't meet SIRS criteria. With tachypnea on mechanical ventilator air trapping likely a contributing factor.  P:  Plan Send PCT Transduce CVP, goal 8-12 Levophed for MAP >65 Avoid air trapping   RENAL  Lab 04/27/12 0900 04/26/12 0450 04/25/12 0445 04/24/12 0450 04/23/12 0507  NA 139 141 140 138 138  K 2.4*  3.2* -- -- --  CL 107 104 104 103 100  CO2 24 30 29 27 26   BUN 14 10 15 19 21   CREATININE 1.42* 1.01 0.93 0.93 1.05  CALCIUM 6.8* 7.6* 7.8* 8.2* 9.4  MG -- -- -- -- --  PHOS -- -- -- -- --   Intake/Output      08/23 0701 - 08/24 0700 08/24 0701 - 08/25 0700   I.V. (mL/kg) 4697.7 (52.8) 669.5 (7.5)   NG/GT     IV Piggyback  100   Total Intake(mL/kg) 4697.7 (52.8) 769.5 (8.7)   Urine (mL/kg/hr) 500 (0.2) 50 (0.1)   Emesis/NG output 175 100  Blood 10    Total Output 685 150   Net +4012.7 +619.5         Foley:    A:  1)  Hypokalemia       2) acute renal failure  Likely scr bump due to hypotension.  P:   Cont IVFs/ keep CVP 8-12 Avoid hypotension Renal dose meds F/u chemistry Strict I&O  GASTROINTESTINAL  Lab 04/26/12 0450 04/22/12 0859  AST 21 18  ALT 20 15  ALKPHOS 93 150*  BILITOT 0.4 0.5  PROT 6.1 8.6*  ALBUMIN 2.5* 4.2    1) A:  Adynamic ileus 2) s/p exploratory lap 8/23 P: NPO PPI f/u abd flat plate.  Will consider TNA  Surgical consultation to assist w/ post-op management and surgical site care   HEMATOLOGIC  Lab 04/27/12 0900 04/26/12 0450 04/25/12 0445 04/24/12 0450 04/23/12 0507  HGB 10.2* 11.7* 12.0* 13.1 13.2  HCT 30.3* 35.7* 37.3* 38.5* 40.3  PLT 175 200 199 198 190  INR -- -- -- -- --  APTT -- -- -- -- --   A:  No acute evidence of bleeding P:  Trend CBC Scheduled PPI  INFECTIOUS  Lab 04/27/12 0900 04/26/12 0450 04/25/12 0445 04/24/12 0450 04/23/12 0507  WBC 10.8* 9.9 10.4 11.0* 11.1*  PROCALCITON -- -- -- -- --   Culture data:   Mrsa PCR 8/22: Neg SA surgical screen 8/22: positive Sputum 8/24>>> bcx 2 8/24>>>  Urine strep antigen 8/24>> Urine legionella antigen 8/24>>>  ABX:  Azithro 8/20>>>8/21 Rocephin 8/20>>>8/21 Zosyn 8/24>>> Vanc 8/24>>>> levaquin 8/24>>>   A:  Pneumonia cap vs aspiration  P: Send sputum and BC Send legionella and strep pneumoniae urine antigen Empiric CAP and HCAP  coverage:  levaquin/vanco/zosyn     ENDOCRINE  Lab 04/27/12 1037 04/27/12 0336 04/26/12 2353 04/26/12 2020 04/26/12 1808  GLUCAP 116* 127* 146* 118* 139*   A: mild hyperglycemia  P: SSI     NEUROLOGIC  A:  1)  Acute encephalopathy       2) pain P:   Supportive care.  Sedation protocol   BEST PRACTICE / DISPOSITION Level of Care:  ICU Primary Service:  PCCM  Consultants:  Surgical  Code Status:  Full  Diet:  NPO  DVT Px:  [place] heparin  GI Px:  PPI  Skin Integrity:  Intact  Social / Family:  Updated   Shan Levans, 04/27/2012, 2:18 PM  I have seen and examined this pt with Kreg Shropshire NP.    Dorcas Carrow Beeper  [phone]  Cell  [phone]  If no response or cell goes to voicemail, call beeper [phone]

## 2012-04-27 NOTE — Progress Notes (Signed)
CRITICAL VALUE ALERT  Critical value received:  K= 2.4  Date of notification:  04/27/12  Time of notification:  1115  Critical value read back:yes  Nurse who received alert:  M. Nehemiah Montee,RN  MD notified (1st page):  Dr. Leticia Penna  Time of first page:  1115  MD notified (2nd page):  Time of second page:  Responding MD:  Dr. Leticia Penna  Time MD responded:  1116   Order received for 4 runs of K. [facility] to start and continue infusion while pt is be transported.

## 2012-04-27 NOTE — Progress Notes (Signed)
Pt transferred to [facility] via [facility]. Family members aware of transfer and at bedside. Pts personal belongings sent with family members. Report called to RN.

## 2012-04-27 NOTE — Progress Notes (Signed)
ANTIBIOTIC CONSULT NOTE - INITIAL  Pharmacy Consult for Vancomycin, Zosyn, and Levaquin Indication: pneumonia  No Known Allergies  Patient Measurements: Height: 5\' 2"  (157.5 cm) Weight: 195 lb 15.8 oz (88.9 kg) IBW/kg (Calculated) : 54.6   Vital Signs: Temp: 99.2 F (37.3 C) (08/24 0800) Temp src: Axillary (08/24 0800) BP: 83/45 mmHg (08/24 1115) Pulse Rate: 112  (08/24 1230) Intake/Output from previous day: 08/23 0701 - 08/24 0700 In: 4697.7 [I.V.:4697.7] Out: 685 [Urine:500; Emesis/NG output:175; Blood:10] Intake/Output from this shift: Total I/O In: 769.5 [I.V.:669.5; IV Piggyback:100] Out: 150 [Urine:50; Emesis/NG output:100]  Labs:  Basename 04/27/12 0900 04/26/12 0450 04/25/12 0445  WBC 10.8* 9.9 10.4  HGB 10.2* 11.7* 12.0*  PLT 175 200 199  LABCREA -- -- --  CREATININE 1.42* 1.01 0.93   Estimated Creatinine Clearance: 42.8 ml/min (by C-G formula based on Cr of 1.42). No results found for this basename: VANCOTROUGH:2,VANCOPEAK:2,VANCORANDOM:2,GENTTROUGH:2,GENTPEAK:2,GENTRANDOM:2,TOBRATROUGH:2,TOBRAPEAK:2,TOBRARND:2,AMIKACINPEAK:2,AMIKACINTROU:2,AMIKACIN:2, in the last 72 hours   Microbiology: Recent Results (from the past 720 hour(s))  SURGICAL PCR SCREEN     Status: Abnormal   Collection Time   04/25/12  7:59 PM      Component Value Range Status Comment   MRSA, PCR NEGATIVE  NEGATIVE Final    Staphylococcus aureus POSITIVE (*) NEGATIVE Final     Medical History: Past Medical History  Diagnosis Date  . Hypertension   . GERD (gastroesophageal reflux disease)   . Thyroid disease   . Upper GI bleed   . Ileus   . Mallory - Weiss tear   . Chronic anemia   . Enlarged prostate     Elevated PSA  . 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 Opal Sidles benign biopsies February 2005  . History of BPH     s/p TURP 2008    Medications:  Anti-infectives     Start     Dose/Rate Route  Frequency Ordered Stop   04/25/12 1605   cefOXitin (MEFOXIN) 2 g in dextrose 5 % 50 mL IVPB  Status:  Discontinued        2 g 100 mL/hr over 30 Minutes Intravenous 60 min pre-op 04/25/12 1605 04/26/12 1039   04/24/12 0930   azithromycin (ZITHROMAX) 500 mg in dextrose 5 % 250 mL IVPB  Status:  Discontinued        500 mg 250 mL/hr over 60 Minutes Intravenous Every 24 hours 04/24/12 0753 04/25/12 1251   04/24/12 0900   cefTRIAXone (ROCEPHIN) 1 g in dextrose 5 % 50 mL IVPB  Status:  Discontinued        1 g 100 mL/hr over 30 Minutes Intravenous Every 24 hours 04/24/12 0753 04/25/12 1252         Assessment: Pneumonia, CAP vs. Aspiration:  To begin broad-spectrum antimicrobial therapy with Vancomycin, Zosyn, and Levaquin.  He is in acute renal failure with decreasing urine output which may worsen.  Goal of Therapy:  Vancomycin trough level 15-20 mcg/ml  Plan:  Vancomycin 1500mg  IV x 1 loading dose Will reassess renal function on 8/25 to determine subsequent Vancomycin doses Zosyn 3.375gm IV q8h extended infusion Levaquin 750mg  IV q48h Monitor renal function and urine output closely Follow-up microbiologic data  Estella Husk, Pharm.D., BCPS Clinical Pharmacist  Phone [phone] Pager [phone] 04/27/2012, 2:08 PM

## 2012-04-27 NOTE — Addendum Note (Signed)
Addendum  created 04/27/12 1532 by Amy L Andraza, CRNA   Modules edited:Notes Section    

## 2012-04-27 NOTE — Consult Note (Signed)
Reason for Consult:post op care Referring Physician: Delford Field MD  Victor Hunter is an 78 y.o. male.  HPI: Asked to see patient at the request of CCM for post op care after a negative laparotomy at [facility] by Dr Tonna Corner.  He  Remained intubated and was accepted by CCM for VDRF.  He is intubated and sedated.  Past Medical History  Diagnosis Date  . Hypertension   . GERD (gastroesophageal reflux disease)   . Thyroid disease   . Upper GI bleed   . Ileus   . Mallory - Weiss tear   . Chronic anemia   . Enlarged prostate     Elevated PSA  . 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 Opal Sidles benign biopsies February 2005  . History of BPH     s/p TURP 2008    Past Surgical History  Procedure Date  . Thyroidectomy   . Cholecystectomy   . Esophagogastroduodenoscopy 08/31/08    Rourk-possible extrinsic impression on midesophagus, noncritical Schatzki's ring, 3 mm fundal gland polyp, multiple gastric fundic polyps not manipulated, small hiatal hernia  . Colonoscopy 10/29/03    rourk-inflammatory polyp from 40 cm removed  . Knee arthroscopy     pt denies  . Transurethral resection of prostate     pt denies, but this is listed in his past medical records.     Family History  Problem Relation Age of Onset  . Cancer Other   . Heart failure Other   . Liver disease Sister   . Breast cancer Sister   . Hypertension Mother   . Stroke Father   . Colon cancer Neg Hx     Social History:  reports that he has quit smoking. His smoking use included Cigarettes. He smoked .5 packs per day. He has never used smokeless tobacco. He reports that he does not drink alcohol or use illicit drugs.  Allergies: No Known Allergies  Medications:  I have reviewed the patient's current medications. Prior to Admission:  Prescriptions prior to admission  Medication Sig Dispense Refill  . aspirin EC 81 MG tablet Take 81 mg by mouth daily.        . calcium carbonate (TUMS - DOSED IN MG ELEMENTAL CALCIUM) 500 MG chewable tablet Chew 1 tablet by mouth daily as needed. Acid Reflux      . diltiazem (DILACOR XR) 180 MG 24 hr capsule Take 180 mg by mouth daily.      . hydrochlorothiazide (HYDRODIURIL) 25 MG tablet Take 25 mg by mouth daily.      Marland Kitchen levothyroxine (SYNTHROID, LEVOTHROID) 125 MCG tablet Take 125 mcg by mouth daily.      . potassium chloride SA (K-DUR,KLOR-CON) 20 MEQ tablet Take 20 mEq by mouth daily.      . Tamsulosin HCl (FLOMAX) 0.4 MG CAPS Take 0.4 mg by mouth daily after supper.       Scheduled:   . ipratropium  0.5 mg Nebulization QID   And  . albuterol  2.5 mg Nebulization Q4H  . antiseptic oral rinse  15 mL Mouth Rinse QID  . chlorhexidine  15 mL Mouth Rinse BID  . heparin  5,000 Units Subcutaneous Q8H  . insulin aspart  0-4 Units Subcutaneous Q4H  . levofloxacin (LEVAQUIN) IV  750 mg Intravenous Q48H  . levothyroxine  62.5 mcg Intravenous Daily  . metoCLOPramide (REGLAN) injection  5 mg Intravenous Q6H  . mupirocin ointment  1  application Nasal BID  . pantoprazole (PROTONIX) IV  40 mg Intravenous QAC breakfast  . piperacillin-tazobactam (ZOSYN)  IV  3.375 g Intravenous Q8H  . potassium chloride  10 mEq Intravenous Q1 Hr x 4  . potassium chloride  10 mEq Intravenous Q1 Hr x 4  . potassium chloride      . sodium chloride      . vancomycin  1,500 mg Intravenous Once  . DISCONTD: albuterol  2.5 mg Nebulization Q4H  . DISCONTD: Chlorhexidine Gluconate Cloth  6 each Topical Daily  . DISCONTD: enoxaparin (LOVENOX) injection  40 mg Subcutaneous Q24H  . DISCONTD: ipratropium  0.5 mg Nebulization Q4H  . DISCONTD: pantoprazole (PROTONIX) IV  40 mg Intravenous QHS  . DISCONTD: sodium chloride  10-40 mL Intracatheter Q12H   Continuous:   . 0.9 % NaCl with KCl 20 mEq / L 75 mL/hr at 04/27/12 1515  . dextrose    . norepinephrine (LEVOPHED) Adult infusion 5 mcg/min (04/27/12 1115)  . propofol 40 mcg/kg/min (04/27/12  1350)  . TPN (CLINIMIX) +/- additives    . DISCONTD: sodium chloride 75 mL/hr at 04/27/12 1429  . DISCONTD: dextrose 5 % and 0.9% NaCl 100 mL/hr at 04/27/12 1100  . DISCONTD: fentaNYL infusion INTRAVENOUS 150 mcg/hr (04/27/12 1103)  . DISCONTD: propofol 40 mcg/kg/min (04/26/12 1921)  . DISCONTD: propofol 20 mcg/kg/min (04/26/12 2109)  . DISCONTD: propofol      Results for orders placed during the hospital encounter of 04/22/12 (from the past 48 hour(s))  SURGICAL PCR SCREEN     Status: Abnormal   Collection Time   04/25/12  7:59 PM      Component Value Range Comment   MRSA, PCR NEGATIVE  NEGATIVE    Staphylococcus aureus POSITIVE (*) NEGATIVE   CBC WITH DIFFERENTIAL     Status: Abnormal   Collection Time   04/26/12  4:50 AM      Component Value Range Comment   WBC 9.9  4.0 - 10.5 K/uL    RBC 4.51  4.22 - 5.81 MIL/uL    Hemoglobin 11.7 (*) 13.0 - 17.0 g/dL    HCT 11.9 (*) 14.7 - 52.0 %    MCV 79.2  78.0 - 100.0 fL    MCH 25.9 (*) 26.0 - 34.0 pg    MCHC 32.8  30.0 - 36.0 g/dL    RDW 82.9 (*) 56.2 - 15.5 %    Platelets 200  150 - 400 K/uL    Neutrophils Relative 73  43 - 77 %    Neutro Abs 7.3  1.7 - 7.7 K/uL    Lymphocytes Relative 14  12 - 46 %    Lymphs Abs 1.3  0.7 - 4.0 K/uL    Monocytes Relative 10  3 - 12 %    Monocytes Absolute 1.0  0.1 - 1.0 K/uL    Eosinophils Relative 3  0 - 5 %    Eosinophils Absolute 0.3  0.0 - 0.7 K/uL    Basophils Relative 0  0 - 1 %    Basophils Absolute 0.0  0.0 - 0.1 K/uL   COMPREHENSIVE METABOLIC PANEL     Status: Abnormal   Collection Time   04/26/12  4:50 AM      Component Value Range Comment   Sodium 141  135 - 145 mEq/L    Potassium 3.2 (*) 3.5 - 5.1 mEq/L    Chloride 104  96 - 112 mEq/L    CO2 30  19 - 32  mEq/L    Glucose, Bld 112 (*) 70 - 99 mg/dL    BUN 10  6 - 23 mg/dL    Creatinine, Ser 1.61  0.50 - 1.35 mg/dL    Calcium 7.6 (*) 8.4 - 10.5 mg/dL    Total Protein 6.1  6.0 - 8.3 g/dL    Albumin 2.5 (*) 3.5 - 5.2 g/dL    AST 21   0 - 37 U/L    ALT 20  0 - 53 U/L    Alkaline Phosphatase 93  39 - 117 U/L    Total Bilirubin 0.4  0.3 - 1.2 mg/dL    GFR calc non Af Amer 70 (*) >90 mL/min    GFR calc Af Amer 81 (*) >90 mL/min   TYPE AND SCREEN     Status: Normal   Collection Time   04/26/12  8:30 AM      Component Value Range Comment   ABO/RH(D) O POS      Antibody Screen NEG      Sample Expiration 04/29/2012     GLUCOSE, CAPILLARY     Status: Abnormal   Collection Time   04/26/12 11:44 AM      Component Value Range Comment   Glucose-Capillary 102 (*) 70 - 99 mg/dL   BLOOD GAS, ARTERIAL     Status: Abnormal   Collection Time   04/26/12  4:00 PM      Component Value Range Comment   FIO2 100.00      Delivery systems VENTILATOR      Mode PRESSURE REGULATED VOLUME CONTROL      VT 600      Rate 12      Peep/cpap 5.0      pH, Arterial 7.423  7.350 - 7.450    pCO2 arterial 41.3  35.0 - 45.0 mmHg    pO2, Arterial 250.0 (*) 80.0 - 100.0 mmHg    Bicarbonate 26.5 (*) 20.0 - 24.0 mEq/L    TCO2 23.9  0 - 100 mmol/L    Acid-Base Excess 2.4 (*) 0.0 - 2.0 mmol/L    O2 Saturation 99.6      Patient temperature 37.0      Collection site RIGHT RADIAL      Drawn by COLLECTED BY RT      Sample type ARTERIAL      Allens test (pass/fail) PASS  PASS   GLUCOSE, CAPILLARY     Status: Abnormal   Collection Time   04/26/12  4:10 PM      Component Value Range Comment   Glucose-Capillary 137 (*) 70 - 99 mg/dL   BLOOD GAS, ARTERIAL     Status: Abnormal   Collection Time   04/26/12  6:05 PM      Component Value Range Comment   FIO2 100.00      O2 Content 100.0      Delivery systems VENTILATOR      Mode PRESSURE REGULATED VOLUME CONTROL      VT 600      Rate 12      Peep/cpap 5.0      pH, Arterial 7.674 (*) 7.350 - 7.450    pCO2 arterial 19.3 (*) 35.0 - 45.0 mmHg    pO2, Arterial 205.0 (*) 80.0 - 100.0 mmHg    Bicarbonate 23.0  20.0 - 24.0 mEq/L    TCO2 19.8  0 - 100 mmol/L    Acid-Base Excess 1.9  0.0 - 2.0 mmol/L    O2  Saturation  99.8      Patient temperature 37.0      Collection site RIGHT RADIAL      Drawn by COLLECTED BY RT      Sample type ARTERIAL      Allens test (pass/fail) PASS  PASS   GLUCOSE, CAPILLARY     Status: Abnormal   Collection Time   04/26/12  6:08 PM      Component Value Range Comment   Glucose-Capillary 139 (*) 70 - 99 mg/dL   GLUCOSE, CAPILLARY     Status: Abnormal   Collection Time   04/26/12  8:20 PM      Component Value Range Comment   Glucose-Capillary 118 (*) 70 - 99 mg/dL   BLOOD GAS, ARTERIAL     Status: Abnormal   Collection Time   04/26/12 10:43 PM      Component Value Range Comment   FIO2 100.00      Delivery systems VENTILATOR      Mode PRESSURE REGULATED VOLUME CONTROL      VT 500      Rate 12      Peep/cpap 5.0      pH, Arterial 7.446  7.350 - 7.450    pCO2 arterial 37.0  35.0 - 45.0 mmHg    pO2, Arterial 320.0 (*) 80.0 - 100.0 mmHg    Bicarbonate 25.1 (*) 20.0 - 24.0 mEq/L    TCO2 22.1  0 - 100 mmol/L    Acid-Base Excess 1.4  0.0 - 2.0 mmol/L    O2 Saturation 99.5      Patient temperature 37.0      Collection site RADIAL      Drawn by COLLECTED BY RT      Sample type ARTERIAL      Allens test (pass/fail) PASS  PASS   GLUCOSE, CAPILLARY     Status: Abnormal   Collection Time   04/26/12 11:53 PM      Component Value Range Comment   Glucose-Capillary 146 (*) 70 - 99 mg/dL   GLUCOSE, CAPILLARY     Status: Abnormal   Collection Time   04/27/12  3:36 AM      Component Value Range Comment   Glucose-Capillary 127 (*) 70 - 99 mg/dL   BLOOD GAS, ARTERIAL     Status: Abnormal   Collection Time   04/27/12  4:21 AM      Component Value Range Comment   FIO2 0.50      Delivery systems VENTILATOR      VT 500      Rate 12      Peep/cpap 5.0      pH, Arterial 7.709 (*) 7.350 - 7.450    pCO2 arterial 17.1 (*) 35.0 - 45.0 mmHg    pO2, Arterial 58.2 (*) 80.0 - 100.0 mmHg    Bicarbonate 22.2  20.0 - 24.0 mEq/L    TCO2 22.2  0 - 100 mmol/L    Acid-Base Excess 1.5   0.0 - 2.0 mmol/L    O2 Saturation 95.8      Patient temperature 37.0      Collection site RADIAL      Drawn by COLLECTED BY RT      Sample type ARTERIAL      Allens test (pass/fail) PASS  PASS   CBC     Status: Abnormal   Collection Time   04/27/12  9:00 AM      Component Value Range Comment   WBC 10.8 (*) 4.0 -  10.5 K/uL    RBC 3.89 (*) 4.22 - 5.81 MIL/uL    Hemoglobin 10.2 (*) 13.0 - 17.0 g/dL    HCT 16.1 (*) 09.6 - 52.0 %    MCV 77.9 (*) 78.0 - 100.0 fL    MCH 26.2  26.0 - 34.0 pg    MCHC 33.7  30.0 - 36.0 g/dL    RDW 04.5  40.9 - 81.1 %    Platelets 175  150 - 400 K/uL   BASIC METABOLIC PANEL     Status: Abnormal   Collection Time   04/27/12  9:00 AM      Component Value Range Comment   Sodium 139  135 - 145 mEq/L    Potassium 2.4 (*) 3.5 - 5.1 mEq/L    Chloride 107  96 - 112 mEq/L    CO2 24  19 - 32 mEq/L    Glucose, Bld 125 (*) 70 - 99 mg/dL    BUN 14  6 - 23 mg/dL    Creatinine, Ser 9.14 (*) 0.50 - 1.35 mg/dL    Calcium 6.8 (*) 8.4 - 10.5 mg/dL    GFR calc non Af Amer 46 (*) >90 mL/min    GFR calc Af Amer 54 (*) >90 mL/min   GLUCOSE, CAPILLARY     Status: Abnormal   Collection Time   04/27/12 10:37 AM      Component Value Range Comment   Glucose-Capillary 116 (*) 70 - 99 mg/dL   POCT I-STAT 3, BLOOD GAS (G3+)     Status: Abnormal   Collection Time   04/27/12  1:40 PM      Component Value Range Comment   pH, Arterial 7.335 (*) 7.350 - 7.450    pCO2 arterial 45.4 (*) 35.0 - 45.0 mmHg    pO2, Arterial 113.0 (*) 80.0 - 100.0 mmHg    Bicarbonate 24.2 (*) 20.0 - 24.0 mEq/L    TCO2 26  0 - 100 mmol/L    O2 Saturation 98.0      Acid-base deficit 2.0  0.0 - 2.0 mmol/L    Collection site RADIAL, ALLEN'S TEST ACCEPTABLE      Drawn by Operator      Sample type ARTERIAL     CBC     Status: Abnormal   Collection Time   04/27/12  2:20 PM      Component Value Range Comment   WBC 14.2 (*) 4.0 - 10.5 K/uL    RBC 4.21 (*) 4.22 - 5.81 MIL/uL    Hemoglobin 10.8 (*) 13.0 -  17.0 g/dL    HCT 78.2 (*) 95.6 - 52.0 %    MCV 78.6  78.0 - 100.0 fL    MCH 25.7 (*) 26.0 - 34.0 pg    MCHC 32.6  30.0 - 36.0 g/dL    RDW 21.3 (*) 08.6 - 15.5 %    Platelets 201  150 - 400 K/uL   CREATININE, SERUM     Status: Abnormal   Collection Time   04/27/12  2:20 PM      Component Value Range Comment   Creatinine, Ser 1.63 (*) 0.50 - 1.35 mg/dL    GFR calc non Af Amer 39 (*) >90 mL/min    GFR calc Af Amer 46 (*) >90 mL/min   MAGNESIUM     Status: Normal   Collection Time   04/27/12  2:20 PM      Component Value Range Comment   Magnesium 2.0  1.5 - 2.5 mg/dL  PHOSPHORUS     Status: Normal   Collection Time   04/27/12  2:20 PM      Component Value Range Comment   Phosphorus 3.4  2.3 - 4.6 mg/dL   AMYLASE     Status: Normal   Collection Time   04/27/12  2:20 PM      Component Value Range Comment   Amylase 41  0 - 105 U/L   LIPASE, BLOOD     Status: Normal   Collection Time   04/27/12  2:20 PM      Component Value Range Comment   Lipase 20  11 - 59 U/L   LACTIC ACID, PLASMA     Status: Normal   Collection Time   04/27/12  2:20 PM      Component Value Range Comment   Lactic Acid, Venous 0.9  0.5 - 2.2 mmol/L   PROCALCITONIN     Status: Normal   Collection Time   04/27/12  2:20 PM      Component Value Range Comment   Procalcitonin 0.52     PRO B NATRIURETIC PEPTIDE     Status: Normal   Collection Time   04/27/12  2:20 PM      Component Value Range Comment   Pro B Natriuretic peptide (BNP) 132.0  0 - 450 pg/mL   PROTIME-INR     Status: Abnormal   Collection Time   04/27/12  2:20 PM      Component Value Range Comment   Prothrombin Time 15.9 (*) 11.6 - 15.2 seconds    INR 1.24  0.00 - 1.49   APTT     Status: Normal   Collection Time   04/27/12  2:20 PM      Component Value Range Comment   aPTT 29  24 - 37 seconds   URINALYSIS, ROUTINE W REFLEX MICROSCOPIC     Status: Abnormal   Collection Time   04/27/12  3:01 PM      Component Value Range Comment   Color, Urine  AMBER (*) YELLOW BIOCHEMICALS MAY BE AFFECTED BY COLOR   APPearance TURBID (*) CLEAR    Specific Gravity, Urine 1.025  1.005 - 1.030    pH 6.0  5.0 - 8.0    Glucose, UA NEGATIVE  NEGATIVE mg/dL    Hgb urine dipstick LARGE (*) NEGATIVE    Bilirubin Urine SMALL (*) NEGATIVE    Ketones, ur 15 (*) NEGATIVE mg/dL    Protein, ur 409 (*) NEGATIVE mg/dL    Urobilinogen, UA 0.2  0.0 - 1.0 mg/dL    Nitrite NEGATIVE  NEGATIVE    Leukocytes, UA MODERATE (*) NEGATIVE   URINE MICROSCOPIC-ADD ON     Status: Abnormal   Collection Time   04/27/12  3:01 PM      Component Value Range Comment   Squamous Epithelial / LPF RARE  RARE    WBC, UA 11-20  <3 WBC/hpf    RBC / HPF 21-50  <3 RBC/hpf    Casts GRANULAR CAST (*) NEGATIVE     Portable Chest Xray  04/27/2012  *RADIOLOGY REPORT*  Clinical Data: Check endotracheal tube  PORTABLE CHEST - 1 VIEW  Comparison: 04/27/2012  Findings: Endotracheal tube terminates at/just above the carina. Withdrawal approximately 3 cm is suggested.  Patchy left upper/lower lobe opacities, suspicious for pneumonia. Possible small left pleural effusion.  No pneumothorax is seen.  Stable cardiomegaly.  Surgical clips overlying the bilateral neck.  IMPRESSION: Endotracheal tube terminates at/just above the  carina.  Withdrawal approximately 3 cm is suggested.  Patchy left upper/lower lobe opacities, suspicious for pneumonia. Possible small left pleural effusion.   Original Report Authenticated By: Charline Bills, M.D.    Dg Chest Port 1 View  04/27/2012  *RADIOLOGY REPORT*  Clinical Data: Post colon surgery and ventilated.  PORTABLE CHEST - 1 VIEW  Comparison: 04/26/2012  Findings: Endotracheal tube is 2.8 cm above the carina.  Persistent patchy airspace densities in the left lung are unchanged.  Few densities in the right hilum are unchanged.  Nasogastric tube extends into the upper stomach.  There is scoliosis of the thoracic spine.  Left subclavian central line tip in upper SVC  region. There appears to be contrast material within the colon.  IMPRESSION: Persistent airspace disease in the left lung is concerning for infection.  These findings have minimally changed.  Support apparatuses as described.   Original Report Authenticated By: Richarda Overlie, M.D.    Dg Chest Port 1 View  04/26/2012  *RADIOLOGY REPORT*  Clinical Data: 78 year old male with increased RR interval.  PORTABLE CHEST - 1 VIEW  Comparison: 0934 hours the same day and earlier.  Findings: Upright portable AP view at 9-11 hours.  Stable endotracheal tube tip.  Enteric tube courses to the abdomen, tip not included.  Stable left subclavian line.  Continued low lung volumes.  Continued patchy opacity in the left mid and lower lung. Stable cardiac size and mediastinal contours.  No pneumothorax. Increased atelectasis along the right minor fissure.  No large effusion.  IMPRESSION: 1. Stable lines and tubes. 2.  Continued low lung volumes with left lung airspace disease suggestive of pneumonia.   Original Report Authenticated By: Harley Hallmark, M.D.    Dg Chest Portable 1 View  04/26/2012  *RADIOLOGY REPORT*  Clinical Data: Central line placement in the operating room.  PORTABLE CHEST - 1 VIEW  Comparison: 04/22/2012 and 11/13/2008.  Findings: 0934 hours.  Portions of the lower right chest are excluded from this portable radiograph.  Endotracheal tube tip is 3.3 cm above the carina.  A left subclavian central venous catheter has been placed.  This projects laterally in the right paratracheal region at the level of the azygos vein.  A nasogastric tube projects below the diaphragm.  There are low lung volumes with patchy perihilar opacities bilaterally.  No pneumothorax or significant pleural effusion is seen.  The heart size and mediastinal contours are stable.  There are multiple surgical clips at the thoracic inlet.  Convex right thoracic scoliosis is noted.  IMPRESSION:  1.  Left subclavian central venous catheter projects  laterally and may abut the lateral wall of the SVC near the azygos vein. Correlate clinically. 2.  No pneumothorax. 3.  Patchy perihilar pulmonary opacities bilaterally.   Original Report Authenticated By: Gerrianne Scale, M.D.     Review of Systems  Unable to perform ROS  Blood pressure 123/68, pulse 91, temperature 99.2 F (37.3 C), temperature source Axillary, resp. rate 14, height 5\' 2"  (1.575 m), weight 195 lb 5.2 oz (88.6 kg), SpO2 100.00%. Physical Exam  Constitutional: He appears well-developed and well-nourished.  HENT:  Head: Normocephalic and atraumatic.       ET tube in place  Cardiovascular: Normal rate and regular rhythm.   Respiratory: Effort normal and breath sounds normal.  GI:    Skin: Skin is warm and dry.    Assessment/Plan: Post op Ex lap for ileus VDRF Will follow.  Agree with NGT for now.  Need  to determine nutritional condition but gut not working.  May need TNA.  Elvena Oyer A. 04/27/2012, 3:55 PM

## 2012-04-27 NOTE — Progress Notes (Signed)
Have increased pt back to 80% fio2 , pt still breathing 35 times a minute, suspect fluid and sedation problem together? Blood gas ph 7.709, pCO2 17.1, p02 58.2 , cHCO3 22.2. E link notified by nurse

## 2012-04-27 NOTE — Progress Notes (Signed)
Subjective: Patient sedated and on vent. Pulmonary consult requested. Objective: Vital signs in last 24 hours: Temp:  [98.5 F (36.9 C)-100.6 F (38.1 C)] 100.5 F (38.1 C) (08/24 0400) Pulse Rate:  [54-118] 72  (08/24 0800) Resp:  [10-58] 12  (08/24 0800) BP: (71-187)/(42-145) 113/55 mmHg (08/24 0800) SpO2:  [96 %-100 %] 100 % (08/24 0800) FiO2 (%):  [50 %-100 %] 80 % (08/24 0737) Weight:  [88.9 kg (195 lb 15.8 oz)] 88.9 kg (195 lb 15.8 oz) (08/24 0500) Weight change:  Last BM Date: 04/22/12  Intake/Output from previous day: 08/23 0701 - 08/24 0700 In: 4597.7 [I.V.:4597.7] Out: 685 [Urine:500; Emesis/NG output:175; Blood:10]  PHYSICAL EXAM General appearance: alert, cooperative and sedated and on vent Resp: diminished breath sounds bibasilar Cardio: S1, S2 normal GI: mild distension and active bowel sound, surgical site dressed Extremities: extremities normal, atraumatic, no cyanosis or edema  Lab Results:    @labtest @ ABGS  Basename 04/27/12 0421  PHART 7.709*  PO2ART 58.2*  TCO2 22.2  HCO3 22.2   CULTURES Recent Results (from the past 240 hour(s))  SURGICAL PCR SCREEN     Status: Abnormal   Collection Time   04/25/12  7:59 PM      Component Value Range Status Comment   MRSA, PCR NEGATIVE  NEGATIVE Final    Staphylococcus aureus POSITIVE (*) NEGATIVE Final    Studies/Results: Dg Chest Port 1 View  04/27/2012  *RADIOLOGY REPORT*  Clinical Data: Post colon surgery and ventilated.  PORTABLE CHEST - 1 VIEW  Comparison: 04/26/2012  Findings: Endotracheal tube is 2.8 cm above the carina.  Persistent patchy airspace densities in the left lung are unchanged.  Few densities in the right hilum are unchanged.  Nasogastric tube extends into the upper stomach.  There is scoliosis of the thoracic spine.  Left subclavian central line tip in upper SVC region. There appears to be contrast material within the colon.  IMPRESSION: Persistent airspace disease in the left lung is  concerning for infection.  These findings have minimally changed.  Support apparatuses as described.   Original Report Authenticated By: Richarda Overlie, M.D.    Dg Chest Port 1 View  04/26/2012  *RADIOLOGY REPORT*  Clinical Data: 78 year old male with increased RR interval.  PORTABLE CHEST - 1 VIEW  Comparison: 0934 hours the same day and earlier.  Findings: Upright portable AP view at 9-11 hours.  Stable endotracheal tube tip.  Enteric tube courses to the abdomen, tip not included.  Stable left subclavian line.  Continued low lung volumes.  Continued patchy opacity in the left mid and lower lung. Stable cardiac size and mediastinal contours.  No pneumothorax. Increased atelectasis along the right minor fissure.  No large effusion.  IMPRESSION: 1. Stable lines and tubes. 2.  Continued low lung volumes with left lung airspace disease suggestive of pneumonia.   Original Report Authenticated By: Harley Hallmark, M.D.    Dg Chest Portable 1 View  04/26/2012  *RADIOLOGY REPORT*  Clinical Data: Central line placement in the operating room.  PORTABLE CHEST - 1 VIEW  Comparison: 04/22/2012 and 11/13/2008.  Findings: 0934 hours.  Portions of the lower right chest are excluded from this portable radiograph.  Endotracheal tube tip is 3.3 cm above the carina.  A left subclavian central venous catheter has been placed.  This projects laterally in the right paratracheal region at the level of the azygos vein.  A nasogastric tube projects below the diaphragm.  There are low lung volumes with patchy perihilar  opacities bilaterally.  No pneumothorax or significant pleural effusion is seen.  The heart size and mediastinal contours are stable.  There are multiple surgical clips at the thoracic inlet.  Convex right thoracic scoliosis is noted.  IMPRESSION:  1.  Left subclavian central venous catheter projects laterally and may abut the lateral wall of the SVC near the azygos vein. Correlate clinically. 2.  No pneumothorax. 3.  Patchy  perihilar pulmonary opacities bilaterally.   Original Report Authenticated By: Gerrianne Scale, M.D.     Medications: I have reviewed the patient's current medications.  Assesment:  1. S/P exploratory laparotomy 2 . Small bowel obstruction 2.. Pneumonia 3.GERD  5. Hiccup 6. On ventilatory support   Active Problems:  * No active hospital problems. *     Plan: Continue NG tube suctioning Continue Iv antibiotic Pulmonary consult Nebulizer treatment We will discuss with Dr. Juanetta Gosling about further management As per surgery plan  LOS: 5 days   Victor Hunter 04/27/2012, 8:10 AM

## 2012-04-27 NOTE — Anesthesia Postprocedure Evaluation (Signed)
  Anesthesia Post-op Note  Patient: Victor Hunter  Procedure(s) Performed: Procedure(s) (LRB): EXPLORATORY LAPAROTOMY (N/A) INSERTION CENTRAL LINE ADULT (Left)  Patient Location: ICU  Anesthesia Type: General  Level of Consciousness: sedated per chart  Airway and Oxygen Therapy: Patient remains intubated per anesthesia plan per chart  Post-op Pain: Unable to evaluate  Post-op Assessment:Unable to evaluate;patient transferred to [facility] ICU    Complications:Patient remains intubated; unable to wean vent; transferred to [facility] ICU. According to chart, unable to wean ventilator this morning.  Patient was transferred to [facility] ICU.

## 2012-04-27 NOTE — Progress Notes (Signed)
Dr. Marin Shutter notified of elevated resp rate 30-40 times per minute.  Short breaths taken.  Patient not breathing in sync with the ventilator.   Fentanyl IV administered, diprovan drip increased to 70 mcg and respiratory therapist in to adjust ventilator.   ETT 24 at lip and abdomen distended and hard to the touch.  Abdomen dressing intact with shadowing noted and marked prior to transport from [facility] ICU.  New orders obtained.

## 2012-04-27 NOTE — Progress Notes (Signed)
Pt started once again breathing 35-40 times a minute, Sats are 98 -97 , suctioning thin clear secretions. Still unclear as to why he does this.oxygen appears ok.

## 2012-04-27 NOTE — Addendum Note (Signed)
Addendum  created 04/27/12 1532 by Marolyn Hammock, CRNA   Modules edited:Notes Section

## 2012-04-28 ENCOUNTER — Inpatient Hospital Stay (HOSPITAL_COMMUNITY): Payer: Medicare PPO

## 2012-04-28 DIAGNOSIS — R079 Chest pain, unspecified: Secondary | ICD-10-CM

## 2012-04-28 DIAGNOSIS — E876 Hypokalemia: Secondary | ICD-10-CM

## 2012-04-28 LAB — PREALBUMIN: Prealbumin: 6.2 mg/dL — ABNORMAL LOW (ref 17.0–34.0)

## 2012-04-28 LAB — LEGIONELLA ANTIGEN, URINE: Legionella Antigen, Urine: NEGATIVE

## 2012-04-28 LAB — POCT I-STAT 3, ART BLOOD GAS (G3+)
Acid-base deficit: 4 mmol/L — ABNORMAL HIGH (ref 0.0–2.0)
O2 Saturation: 96 %
Patient temperature: 98.9
TCO2: 22 mmol/L (ref 0–100)
pCO2 arterial: 34.7 mmHg — ABNORMAL LOW (ref 35.0–45.0)

## 2012-04-28 LAB — COMPREHENSIVE METABOLIC PANEL
ALT: 18 U/L (ref 0–53)
Albumin: 1.7 g/dL — ABNORMAL LOW (ref 3.5–5.2)
Alkaline Phosphatase: 71 U/L (ref 39–117)
Potassium: 2.9 mEq/L — ABNORMAL LOW (ref 3.5–5.1)
Sodium: 140 mEq/L (ref 135–145)
Total Protein: 5.1 g/dL — ABNORMAL LOW (ref 6.0–8.3)

## 2012-04-28 LAB — GLUCOSE, CAPILLARY
Glucose-Capillary: 175 mg/dL — ABNORMAL HIGH (ref 70–99)
Glucose-Capillary: 181 mg/dL — ABNORMAL HIGH (ref 70–99)

## 2012-04-28 LAB — TRIGLYCERIDES: Triglycerides: 211 mg/dL — ABNORMAL HIGH (ref <150)

## 2012-04-28 LAB — CBC
HCT: 29.8 % — ABNORMAL LOW (ref 39.0–52.0)
Hemoglobin: 9.8 g/dL — ABNORMAL LOW (ref 13.0–17.0)
RDW: 15.8 % — ABNORMAL HIGH (ref 11.5–15.5)
WBC: 11.4 10*3/uL — ABNORMAL HIGH (ref 4.0–10.5)

## 2012-04-28 LAB — DIFFERENTIAL
Basophils Absolute: 0 10*3/uL (ref 0.0–0.1)
Lymphocytes Relative: 13 % (ref 12–46)
Monocytes Absolute: 0.9 10*3/uL (ref 0.1–1.0)
Neutro Abs: 8.5 10*3/uL — ABNORMAL HIGH (ref 1.7–7.7)

## 2012-04-28 LAB — PHOSPHORUS: Phosphorus: 2 mg/dL — ABNORMAL LOW (ref 2.3–4.6)

## 2012-04-28 MED ORDER — CLINIMIX E/DEXTROSE (5/15) 5 % IV SOLN
INTRAVENOUS | Status: DC
  Filled 2012-04-28: qty 1000

## 2012-04-28 MED ORDER — METHYLPREDNISOLONE SODIUM SUCC 40 MG IJ SOLR
40.0000 mg | Freq: Four times a day (QID) | INTRAMUSCULAR | Status: DC
  Administered 2012-04-28 – 2012-04-29 (×5): 40 mg via INTRAVENOUS
  Filled 2012-04-28 (×10): qty 1

## 2012-04-28 MED ORDER — POTASSIUM CHLORIDE 10 MEQ/50ML IV SOLN
10.0000 meq | INTRAVENOUS | Status: AC
  Administered 2012-04-28 (×4): 10 meq via INTRAVENOUS
  Filled 2012-04-28 (×4): qty 50

## 2012-04-28 MED ORDER — POTASSIUM PHOSPHATE DIBASIC 3 MMOLE/ML IV SOLN
15.0000 mmol | Freq: Once | INTRAVENOUS | Status: AC
  Administered 2012-04-28: 15 mmol via INTRAVENOUS
  Filled 2012-04-28: qty 5

## 2012-04-28 MED ORDER — POTASSIUM CHLORIDE 20 MEQ/15ML (10%) PO LIQD
40.0000 meq | Freq: Three times a day (TID) | ORAL | Status: DC
  Filled 2012-04-28 (×3): qty 30

## 2012-04-28 MED ORDER — SODIUM CHLORIDE 0.9 % IV SOLN
INTRAVENOUS | Status: DC
Start: 2012-04-28 — End: 2012-04-28
  Administered 2012-04-28: 06:00:00 via INTRAVENOUS

## 2012-04-28 MED ORDER — VANCOMYCIN HCL 1000 MG IV SOLR
750.0000 mg | Freq: Two times a day (BID) | INTRAVENOUS | Status: DC
  Administered 2012-04-28 – 2012-04-29 (×3): 750 mg via INTRAVENOUS
  Filled 2012-04-28 (×4): qty 750

## 2012-04-28 MED ORDER — POTASSIUM CHLORIDE 10 MEQ/50ML IV SOLN
10.0000 meq | INTRAVENOUS | Status: AC
  Administered 2012-04-28 (×4): 10 meq via INTRAVENOUS
  Filled 2012-04-28: qty 200

## 2012-04-28 MED ORDER — DEXTROSE 5 % IV SOLN
15.0000 mmol | Freq: Once | INTRAVENOUS | Status: AC
  Administered 2012-04-28: 15 mmol via INTRAVENOUS
  Filled 2012-04-28: qty 5

## 2012-04-28 MED ORDER — THIAMINE HCL 100 MG/ML IJ SOLN
INTRAVENOUS | Status: AC
  Administered 2012-04-28: 17:00:00 via INTRAVENOUS
  Filled 2012-04-28: qty 1000

## 2012-04-28 MED ORDER — FUROSEMIDE 10 MG/ML IJ SOLN
40.0000 mg | Freq: Four times a day (QID) | INTRAMUSCULAR | Status: AC
  Administered 2012-04-28 (×3): 40 mg via INTRAVENOUS
  Filled 2012-04-28 (×3): qty 4

## 2012-04-28 NOTE — Progress Notes (Signed)
Patient dangling on side of bed per post extubation orders (1-2 minutes).  Assisted back to bed and repositioned to chair position.  Lung fields diminshed in bases greater on left than right.  (8 am assessment   Left base more diminished than right).  Routine IV lasix 40 mg and 40 mg solu cortef administered at this time.  Respiratory therapy in to assess, breathing treatment administered. Bed in chair position with head of bed elevated.  Dr. Marin Shutter made aware.  Will continue to monitor.

## 2012-04-28 NOTE — Progress Notes (Signed)
eLink Physician-Brief Progress Note Patient Name: Victor Hunter DOB: 20-Feb-1934 MRN: [id number]  Date of Service  04/28/2012   HPI/Events of Note    Lab 04/28/12 0405 04/27/12 1830 04/27/12 1420 04/27/12 0900 04/26/12 0450 04/25/12 0445  NA 140 137 -- 139 141 140  K 2.9* 2.9* -- -- -- --  CL 110 107 -- 107 104 104  CO2 23 22 -- 24 30 29   GLUCOSE 125* 121* -- 125* 112* 129*  BUN 13 13 -- 14 10 15   CREATININE 1.32 1.38* 1.63* 1.42* 1.01 --  CALCIUM 7.2* 6.9* -- 6.8* 7.6* 7.8*  MG 2.0 -- 2.0 -- -- --  PHOS 2.0* -- 3.4 -- -- --   Intake/Output      08/24 0701 - 08/25 0700   I.V. (mL/kg) 1876.8 (21.1)   IV Piggyback 951   TPN 440   Total Intake(mL/kg) 3267.8 (36.8)   Urine (mL/kg/hr) 1150 (0.5)   Emesis/NG output 300   Total Output 1450   Net +1817.8          eICU Interventions  Dc kcl in mainteance iv fluids Iv kcl x 4 runs k phos repletion Monitor - k goal should be > 4 in setting of ileus   Intervention Category Major Interventions: Electrolyte abnormality - evaluation and management  Karima Carrell 04/28/2012, 5:20 AM

## 2012-04-28 NOTE — Progress Notes (Signed)
ANTIBIOTIC CONSULT NOTE - FOLLOW UP  Pharmacy Consult for Vancomycin, Zosyn, Levaquin Indication: pneumonia  No Known Allergies  Patient Measurements: Height: 5\' 2"  (157.5 cm) Weight: 195 lb 15.8 oz (88.9 kg) IBW/kg (Calculated) : 54.6   Vital Signs: Temp: 98.4 F (36.9 C) (08/25 0739) Temp src: Oral (08/25 0800) BP: 161/83 mmHg (08/25 1100) Pulse Rate: 104  (08/25 1100) Intake/Output from previous day: 08/24 0701 - 08/25 0700 In: 3802 [I.V.:2080; IV Piggyback:1162; TPN:560] Out: 1525 [Urine:1225; Emesis/NG output:300] Intake/Output from this shift: Total I/O In: 608.5 [I.V.:151.5; IV Piggyback:297; TPN:160] Out: 1490 [Urine:1490]  Labs:  [facility] 04/28/12 0405 04/27/12 1830 04/27/12 1420 04/27/12 0900  WBC 11.4* -- 14.2* 10.8*  HGB 9.8* -- 10.8* 10.2*  PLT 174 -- 201 175  LABCREA -- -- -- --  CREATININE 1.32 1.38* 1.63* --   Estimated Creatinine Clearance: 46 ml/min (by C-G formula based on Cr of 1.32). No results found for this basename: VANCOTROUGH:2,VANCOPEAK:2,VANCORANDOM:2,GENTTROUGH:2,GENTPEAK:2,GENTRANDOM:2,TOBRATROUGH:2,TOBRAPEAK:2,TOBRARND:2,AMIKACINPEAK:2,AMIKACINTROU:2,AMIKACIN:2, in the last 72 hours   Microbiology: Recent Results (from the past 720 hour(s))  SURGICAL PCR SCREEN     Status: Abnormal   Collection Time   04/25/12  7:59 PM      Component Value Range Status Comment   MRSA, PCR NEGATIVE  NEGATIVE Final    Staphylococcus aureus POSITIVE (*) NEGATIVE Final   CULTURE, BLOOD (ROUTINE X 2)     Status: Normal (Preliminary result)   Collection Time   04/27/12  2:50 PM      Component Value Range Status Comment   Specimen Description BLOOD RIGHT ARM   Final    Special Requests BOTTLES DRAWN AEROBIC AND ANAEROBIC 10CC   Final    Culture  Setup Time 04/27/2012 20:04   Final    Culture     Final    Value:        BLOOD CULTURE RECEIVED NO GROWTH TO DATE CULTURE WILL BE HELD FOR 5 DAYS BEFORE ISSUING A FINAL NEGATIVE REPORT   Report Status PENDING    Incomplete   CULTURE, BLOOD (ROUTINE X 2)     Status: Normal (Preliminary result)   Collection Time   04/27/12  2:52 PM      Component Value Range Status Comment   Specimen Description BLOOD LEFT ARM   Final    Special Requests BOTTLES DRAWN AEROBIC AND ANAEROBIC 10CC   Final    Culture  Setup Time 04/27/2012 20:04   Final    Culture     Final    Value:        BLOOD CULTURE RECEIVED NO GROWTH TO DATE CULTURE WILL BE HELD FOR 5 DAYS BEFORE ISSUING A FINAL NEGATIVE REPORT   Report Status PENDING   Incomplete   MRSA PCR SCREENING     Status: Normal   Collection Time   04/27/12  3:07 PM      Component Value Range Status Comment   MRSA by PCR NEGATIVE  NEGATIVE Final   CULTURE, RESPIRATORY     Status: Normal (Preliminary result)   Collection Time   04/27/12  3:44 PM      Component Value Range Status Comment   Specimen Description TRACHEAL ASPIRATE   Final    Special Requests NONE   Final    Gram Stain PENDING   Incomplete    Culture NO GROWTH   Final    Report Status PENDING   Incomplete     Anti-infectives     Start     Dose/Rate Route Frequency Ordered  Stop   04/27/12 1600   vancomycin (VANCOCIN) 1,500 mg in sodium chloride 0.9 % 500 mL IVPB        1,500 mg 250 mL/hr over 120 Minutes Intravenous  Once 04/27/12 1414 04/27/12 1821   04/27/12 1500   levofloxacin (LEVAQUIN) IVPB 750 mg        750 mg 100 mL/hr over 90 Minutes Intravenous Every 48 hours 04/27/12 1414     04/27/12 1445  piperacillin-tazobactam (ZOSYN) IVPB 3.375 g       3.375 g 12.5 mL/hr over 240 Minutes Intravenous 3 times per day 04/27/12 1414     04/25/12 1605   cefOXitin (MEFOXIN) 2 g in dextrose 5 % 50 mL IVPB  Status:  Discontinued        2 g 100 mL/hr over 30 Minutes Intravenous 60 min pre-op 04/25/12 1605 04/26/12 1039   04/24/12 0930   azithromycin (ZITHROMAX) 500 mg in dextrose 5 % 250 mL IVPB  Status:  Discontinued        500 mg 250 mL/hr over 60 Minutes Intravenous Every 24 hours 04/24/12 0753 04/25/12  1251   04/24/12 0900   cefTRIAXone (ROCEPHIN) 1 g in dextrose 5 % 50 mL IVPB  Status:  Discontinued        1 g 100 mL/hr over 30 Minutes Intravenous Every 24 hours 04/24/12 0753 04/25/12 1252          Assessment: Pneumonia: On Day #2 of antibiotic therapy with Vancomycin, Zosyn, and Levaquin.  He has some renal dysfunction but his creatinine is holding stable and his urine output is improving. He received a Vancomycin loading dose yesterday and now needs a maintenance regimen.  His Zosyn and Levaquin regimens are appropriate.  Goal of Therapy:  Vancomycin trough level 15-20 mcg/ml  Plan:  Start Vancomycin 750mg  IV q12h.  Anticipate checking Vancomycin trough at steady state to evaluate. Continue Zosyn 3.375gm IV q8h extended infusion Continue Levaquin 750mg  IV q48h Monitor renal function and urine output Follow-up microbiologic data  Estella Husk, Pharm.D., BCPS Clinical Pharmacist  Phone [phone] Pager [phone] 04/28/2012, 11:47 AM

## 2012-04-28 NOTE — Progress Notes (Signed)
[facility] Progress Note 2 Days Post-Op  Subjective: The patient is sedated and on the ventilator.  Objective: Vital signs in last 24 hours: Temp:  [98.4 F (36.9 C)-98.9 F (37.2 C)] 98.4 F (36.9 C) (08/25 0739) Pulse Rate:  [57-112] 81  (08/25 0809) Resp:  [12-39] 15  (08/25 0809) BP: (73-195)/(42-103) 101/60 mmHg (08/25 0700) SpO2:  [96 %-100 %] 100 % (08/25 0809) FiO2 (%):  [40 %-75 %] 40 % (08/25 0809) Weight:  [88.6 kg (195 lb 5.2 oz)-88.9 kg (195 lb 15.8 oz)] 88.9 kg (195 lb 15.8 oz) (08/25 0400) Last BM Date: 04/22/12  Intake/Output from previous day: 08/24 0701 - 08/25 0700 In: 3677.1 [I.V.:2050.6; IV Piggyback:1106.5; TPN:520] Out: 1525 [Urine:1225; Emesis/NG output:300] Intake/Output this shift:    General: Sedated.  No distress  Lungs: coarse  Abd: Distended, hypoactive bowel sounds  Extremities: No changes  Neuro: Sedated.  Lab Results:  @LABLAST2 (wbc:2,hgb:2,hct:2,plt:2) BMET  Basename 04/28/12 0405 04/27/12 1830  NA 140 137  K 2.9* 2.9*  CL 110 107  CO2 23 22  GLUCOSE 125* 121*  BUN 13 13  CREATININE 1.32 1.38*  CALCIUM 7.2* 6.9*   PT/INR  Basename 04/27/12 1420  LABPROT 15.9*  INR 1.24   ABG  Basename 04/28/12 0358 04/27/12 1858  PHART 7.389 7.670*  HCO3 20.9 20.9    Studies/Results: Dg Chest Port 1 View  04/28/2012  *RADIOLOGY REPORT*  Clinical Data: Respiratory failure.  PORTABLE CHEST - 1 VIEW  Comparison: 04/27/2012  Findings: Endotracheal tube is 1.9 cm above the carina.  Patchy densities in the left lung are consistent with airspace disease. Nasogastric tube extends into the abdomen.  Left subclavian central line tip in the upper SVC region.  There are low lung volumes. Heart size is upper limits of normal.  Slightly improved aeration at the left lung base.  IMPRESSION: Patchy airspace densities, left side greater than right.  Slightly improved aeration at the left lung base.  Support apparatuses as described.   Original Report  Authenticated By: Richarda Overlie, M.D.    Portable Chest Xray  04/27/2012  *RADIOLOGY REPORT*  Clinical Data: Check endotracheal tube  PORTABLE CHEST - 1 VIEW  Comparison: 04/27/2012  Findings: Endotracheal tube terminates at/just above the carina. Withdrawal approximately 3 cm is suggested.  Patchy left upper/lower lobe opacities, suspicious for pneumonia. Possible small left pleural effusion.  No pneumothorax is seen.  Stable cardiomegaly.  Surgical clips overlying the bilateral neck.  IMPRESSION: Endotracheal tube terminates at/just above the carina.  Withdrawal approximately 3 cm is suggested.  Patchy left upper/lower lobe opacities, suspicious for pneumonia. Possible small left pleural effusion.   Original Report Authenticated By: Charline Bills, M.D.    Dg Chest Port 1 View  04/27/2012  *RADIOLOGY REPORT*  Clinical Data: Post colon surgery and ventilated.  PORTABLE CHEST - 1 VIEW  Comparison: 04/26/2012  Findings: Endotracheal tube is 2.8 cm above the carina.  Persistent patchy airspace densities in the left lung are unchanged.  Few densities in the right hilum are unchanged.  Nasogastric tube extends into the upper stomach.  There is scoliosis of the thoracic spine.  Left subclavian central line tip in upper SVC region. There appears to be contrast material within the colon.  IMPRESSION: Persistent airspace disease in the left lung is concerning for infection.  These findings have minimally changed.  Support apparatuses as described.   Original Report Authenticated By: Richarda Overlie, M.D.    Dg Chest Port 1 View  04/26/2012  *RADIOLOGY REPORT*  Clinical Data: 78 year old male with increased RR interval.  PORTABLE CHEST - 1 VIEW  Comparison: 0934 hours the same day and earlier.  Findings: Upright portable AP view at 9-11 hours.  Stable endotracheal tube tip.  Enteric tube courses to the abdomen, tip not included.  Stable left subclavian line.  Continued low lung volumes.  Continued patchy opacity in the left  mid and lower lung. Stable cardiac size and mediastinal contours.  No pneumothorax. Increased atelectasis along the right minor fissure.  No large effusion.  IMPRESSION: 1. Stable lines and tubes. 2.  Continued low lung volumes with left lung airspace disease suggestive of pneumonia.   Original Report Authenticated By: Harley Hallmark, M.D.    Dg Chest Portable 1 View  04/26/2012  *RADIOLOGY REPORT*  Clinical Data: Central line placement in the operating room.  PORTABLE CHEST - 1 VIEW  Comparison: 04/22/2012 and 11/13/2008.  Findings: 0934 hours.  Portions of the lower right chest are excluded from this portable radiograph.  Endotracheal tube tip is 3.3 cm above the carina.  A left subclavian central venous catheter has been placed.  This projects laterally in the right paratracheal region at the level of the azygos vein.  A nasogastric tube projects below the diaphragm.  There are low lung volumes with patchy perihilar opacities bilaterally.  No pneumothorax or significant pleural effusion is seen.  The heart size and mediastinal contours are stable.  There are multiple surgical clips at the thoracic inlet.  Convex right thoracic scoliosis is noted.  IMPRESSION:  1.  Left subclavian central venous catheter projects laterally and may abut the lateral wall of the SVC near the azygos vein. Correlate clinically. 2.  No pneumothorax. 3.  Patchy perihilar pulmonary opacities bilaterally.   Original Report Authenticated By: Gerrianne Scale, M.D.     Anti-infectives: Anti-infectives     Start     Dose/Rate Route Frequency Ordered Stop   04/27/12 1600   vancomycin (VANCOCIN) 1,500 mg in sodium chloride 0.9 % 500 mL IVPB        1,500 mg 250 mL/hr over 120 Minutes Intravenous  Once 04/27/12 1414 04/27/12 1821   04/27/12 1500   levofloxacin (LEVAQUIN) IVPB 750 mg        750 mg 100 mL/hr over 90 Minutes Intravenous Every 48 hours 04/27/12 1414     04/27/12 1445  piperacillin-tazobactam (ZOSYN) IVPB 3.375 g         3.375 g 12.5 mL/hr over 240 Minutes Intravenous 3 times per day 04/27/12 1414     04/25/12 1605   cefOXitin (MEFOXIN) 2 g in dextrose 5 % 50 mL IVPB  Status:  Discontinued        2 g 100 mL/hr over 30 Minutes Intravenous 60 min pre-op 04/25/12 1605 04/26/12 1039   04/24/12 0930   azithromycin (ZITHROMAX) 500 mg in dextrose 5 % 250 mL IVPB  Status:  Discontinued        500 mg 250 mL/hr over 60 Minutes Intravenous Every 24 hours 04/24/12 0753 04/25/12 1251   04/24/12 0900   cefTRIAXone (ROCEPHIN) 1 g in dextrose 5 % 50 mL IVPB  Status:  Discontinued        1 g 100 mL/hr over 30 Minutes Intravenous Every 24 hours 04/24/12 0753 04/25/12 1252          Assessment/Plan: s/p Procedure(s): EXPLORATORY LAPAROTOMY INSERTION CENTRAL LINE ADULT Continue NGT decompression. Ileus has not resolved. Wound looks okay, no evidence of fascial problems currently. CPm  LOS: 6 days   Marta Lamas. Gae Bon, MD, FACS [phone] [facility] Surgery 04/28/2012

## 2012-04-28 NOTE — Procedures (Signed)
Extubation Procedure Note  Patient Details:   Name: Victor Hunter DOB: [birth year] MRN: [id number]   Airway Documentation:     Evaluation  O2 sats: stable throughout Complications: No apparent complications Patient did tolerate procedure well. Bilateral Breath Sounds: Rhonchi  cuff leak positive Yes  Victor Hunter 04/28/2012, 10:01 AM

## 2012-04-28 NOTE — Progress Notes (Signed)
[facility] ADULT ICU REPLACEMENT PROTOCOL FOR AM LAB REPLACEMENT ONLY  The patient does not apply for the [facility] Adult ICU Electrolyte Replacment Protocol based on the criteria listed below:  On scheduled IV +K 16. If a panic level lab has been reported, has the CCM MD in charge been notified? yes.   Physician:  Dr Jackqulyn Livings, Renae Fickle [facility] 04/28/2012 5:18 AM

## 2012-04-28 NOTE — Progress Notes (Signed)
Name: Victor Hunter MRN: [id number] DOB: [birth year]    LOS: 6  Referring Provider:  Juanetta Gosling Reason for Referral:  resp failure   PULMONARY / CRITICAL CARE MEDICINE  HPI:   78 year old male who was admitted on 8/19 w/ CC: epigastric and substernal discomfort. Dx eval was consistent w/ what was felt to be an SBO.  This was initially followed clinically. His course was complicated by what was likely an aspiration vs preexisting community acquired  PNA .  His abd remained distended so he was brought to the OR on 8/23 for exploration. OR findings were consistent with adynamic ileus, but no mechanical obstructions were identified.  He returned to the ICU post-op on 8/23. Post-op  on 8/24 pt was tachypnic and unable to be weaned. He was transferred to [facility] on 8/24 for further evaluation.  Brief patient description:    78 year old male admitted to [facility] on 8/19 w/ SBO. Pre-op course complicated by CAP vs aspiration. Went to OR on 8/23, Findings: Adynamic ileus. On 8/24 unable to wean so transferred to [facility] for further eval.   Current Status: Sedated on vent, critical condition.  Vital Signs: Temp:  [98.4 F (36.9 C)-98.9 F (37.2 C)] 98.4 F (36.9 C) (08/25 0739) Pulse Rate:  [57-112] 80  (08/25 0900) Resp:  [12-39] 15  (08/25 0900) BP: (73-195)/(42-103) 117/61 mmHg (08/25 0900) SpO2:  [96 %-100 %] 100 % (08/25 0900) FiO2 (%):  [40 %-75 %] 40 % (08/25 0809) Weight:  [88.6 kg (195 lb 5.2 oz)-88.9 kg (195 lb 15.8 oz)] 88.9 kg (195 lb 15.8 oz) (08/25 0400)  Physical Examination: General:  Obese 78 year old aam. Currently sedated on full vent support.  Neuro:  Sedated.  HEENT:  Orally intubated no clear JVD Neck:  large Cardiovascular:  rrr Lungs:  Prolonged exp phase, occ rhonchi  Abdomen:  Distended. Hypoactive. NGT in place.  Musculoskeletal:  intact Skin:  + dependant edema  GU: foley cath, with clear urine.   Active Problems:  Acute respiratory failure  Respiratory alkalosis  Aspiration pneumonia  Hypokalemia  Acute renal failure  S/P exploratory laparotomy  Adynamic ileus   ASSESSMENT AND PLAN  PULMONARY  Lab 04/28/12 0358 04/27/12 1858 04/27/12 1340 04/27/12 0421 04/26/12 2243  PHART 7.389 7.670* 7.335* 7.709* 7.446  PCO2ART 34.7* 18.1* 45.4* 17.1* 37.0  PO2ART 86.0 79.0* 113.0* 58.2* 320.0*  HCO3 20.9 20.9 24.2* 22.2 25.1*  O2SAT 96.0 98.0 98.0 95.8 99.5   Ventilator Settings: Vent Mode:  [-] PRVC FiO2 (%):  [40 %-75 %] 40 % Set Rate:  [12 bmp-14 bmp] 14 bmp Vt Set:  [500 mL] 500 mL PEEP:  [5 cmH20] 5 cmH20 Plateau Pressure:  [22 cmH20-35 cmH20] 25 cmH20 CXR:  ETT good position. Left CVL in good position. Left sided Airspace disease  ETT:  8/23>>>  1) Post-op acute respiratory failure 2) Aspiration PNA 3) Respiratory alkalosis Post-op respiratory failure. Likely multifactorial. PNA, pain, hypoxia.  P:   Diurese and begin PS trials. SBT in AM. Switch from propofol to versed to avoid hypotensive effects. Scheduled nebs See ID section   CARDIOVASCULAR  Lab 04/27/12 1420 04/22/12 1319 04/22/12 0859  TROPONINI -- <0.30 <0.30  LATICACIDVEN 0.9 -- --  PROBNP 132.0 -- --   ECG:   Lines: left subclavian CVL 8/23 at ap in OR>>>  1)  Hypotension/ sirs. Septic shock: not sure if this is sedation related. Can't exclude sepsis, but doesn't meet SIRS criteria. With tachypnea on mechanical  ventilator air trapping likely a contributing factor.  P:  Plan Transduce CVP, goal 8-12 Levophed for MAP >65 Avoid air trapping  Abx per ID section Prop to versed  RENAL  Lab 04/28/12 0405 04/27/12 1830 04/27/12 1420 04/27/12 0900 04/26/12 0450 04/25/12 0445  NA 140 137 -- 139 141 140  K 2.9* 2.9* -- -- -- --  CL 110 107 -- 107 104 104  CO2 23 22 -- 24 30 29   BUN 13 13 -- 14 10 15   CREATININE 1.32 1.38* 1.63* 1.42* 1.01 --  CALCIUM 7.2* 6.9* -- 6.8* 7.6* 7.8*  MG 2.0 -- 2.0 -- -- --  PHOS 2.0* -- 3.4 -- -- --   Intake/Output      08/24 0701 -  08/25 0700 08/25 0701 - 08/26 0700   I.V. (mL/kg) 2050.6 (23.1)    IV Piggyback 1106.5    TPN 520    Total Intake(mL/kg) 3677.1 (41.4)    Urine (mL/kg/hr) 1225 (0.6)    Emesis/NG output 300    Blood     Total Output 1525    Net +2152.1          Foley:    A:  1)  Hypokalemia       2) acute renal failure  Likely scr bump due to hypotension.  P:   Cont IVFs/ keep CVP 8-12 Renal dose meds F/u chemistry Strict I&O Lasix and K replacement.  GASTROINTESTINAL  Lab 04/28/12 0405 04/26/12 0450 04/22/12 0859  AST 19 21 18   ALT 18 20 15   ALKPHOS 71 93 150*  BILITOT 0.4 0.4 0.5  PROT 5.1* 6.1 8.6*  ALBUMIN 1.7* 2.5* 4.2    1) A:  Adynamic ileus 2) s/p exploratory lap 8/23 P: Keep NPO for now, start TPN. PPI Surgical consultation to assist w/ post-op management and surgical site care   HEMATOLOGIC  Lab 04/28/12 0405 04/27/12 1420 04/27/12 0900 04/26/12 0450 04/25/12 0445  HGB 9.8* 10.8* 10.2* 11.7* 12.0*  HCT 29.8* 33.1* 30.3* 35.7* 37.3*  PLT 174 201 175 200 199  INR -- 1.24 -- -- --  APTT -- 29 -- -- --   A:  No acute evidence of bleeding P:  Trend CBC Scheduled PPI  INFECTIOUS  Lab 04/28/12 0405 04/27/12 1420 04/27/12 0900 04/26/12 0450 04/25/12 0445  WBC 11.4* 14.2* 10.8* 9.9 10.4  PROCALCITON -- 0.52 -- -- --   Culture data:   Mrsa PCR 8/22: Neg SA surgical screen 8/22: positive Sputum 8/24>>> bcx 2 8/24>>>  Urine strep antigen 8/24>> Urine legionella antigen 8/24>>>  ABX:  Azithro 8/20>>>8/21 Rocephin 8/20>>>8/21 Zosyn 8/24>>> Vanc 8/24>>>> Levaquin 8/24>>>   A:  Pneumonia cap vs aspiration  P: F/U sputum and BC Legionella and strep pneumoniae urine antigen pending Empiric CAP and HCAP  coverage: [email or web address]  ENDOCRINE  Lab 04/28/12 0720 04/28/12 0351 04/28/12 0003 04/27/12 1949 04/27/12 1559  GLUCAP 135* 107* 105* 102* 122*   A: mild hyperglycemia  P: SSI   NEUROLOGIC  A:  1)  Acute encephalopathy       2) pain P:     Supportive care.  Sedation protocol   Family updated overnight the phone.  CC time 35 min.  Alyson Reedy, M.D. [facility] Pulmonary/Critical Care Medicine. Pager: [phone]. After hours pager: [phone].

## 2012-04-28 NOTE — Progress Notes (Signed)
PARENTERAL NUTRITION CONSULT NOTE - FOLLOW UP  Pharmacy Consult for TPN Indication: Adynamic ileus  No Known Allergies  Patient Measurements: Height: 5\' 2"  (157.5 cm) Weight: 195 lb 15.8 oz (88.9 kg) IBW/kg (Calculated) : 54.6  Adjusted Body Weight: 60 Kg  Vital Signs: Temp: 98.9 F (37.2 C) (08/25 0400) Temp src: Oral (08/25 0400) BP: 101/60 mmHg (08/25 0700) Pulse Rate: 77  (08/25 0700) Intake/Output from previous day: 08/24 0701 - 08/25 0700 In: 3677.1 [I.V.:2050.6; IV Piggyback:1106.5; TPN:520] Out: 1525 [Urine:1225; Emesis/NG output:300] Intake/Output from this shift:    Labs:  Basename 04/28/12 0405 04/27/12 1420 04/27/12 0900  WBC 11.4* 14.2* 10.8*  HGB 9.8* 10.8* 10.2*  HCT 29.8* 33.1* 30.3*  PLT 174 201 175  APTT -- 29 --  INR -- 1.24 --     Basename 04/28/12 0405 04/27/12 1830 04/27/12 1420 04/27/12 0900 04/26/12 0450  NA 140 137 -- 139 --  K 2.9* 2.9* -- 2.4* --  CL 110 107 -- 107 --  CO2 23 22 -- 24 --  GLUCOSE 125* 121* -- 125* --  BUN 13 13 -- 14 --  CREATININE 1.32 1.38* 1.63* -- --  LABCREA -- -- -- -- --  CREAT24HRUR -- -- -- -- --  CALCIUM 7.2* 6.9* -- 6.8* --  MG 2.0 -- 2.0 -- --  PHOS 2.0* -- 3.4 -- --  PROT 5.1* -- -- -- 6.1  ALBUMIN 1.7* -- -- -- 2.5*  AST 19 -- -- -- 21  ALT 18 -- -- -- 20  ALKPHOS 71 -- -- -- 93  BILITOT 0.4 -- -- -- 0.4  BILIDIR -- -- -- -- --  IBILI -- -- -- -- --  PREALBUMIN -- -- -- -- --  TRIG 211* -- -- -- --  CHOLHDL -- -- -- -- --  CHOL 120 -- -- -- --   Estimated Creatinine Clearance: 46 ml/min (by C-G formula based on Cr of 1.32).    Basename 04/28/12 0351 04/28/12 0003 04/27/12 1949  GLUCAP 107* 105* 102*    Insulin Requirements in the past 24 hours:  None s/p starting TPN  Current Nutrition: TPN (CLINIMIX E 5/15) at 40 mL/hr  Propofol infusing at 69mcg/kg/min (~90ml/hr) provides 720 Kcal from fat NPO  Nutritional Goals (per RD):  1497 kCal, 95-110 grams of protein per  day  Assessment: Patient admitted to [facility] with epigastric pain, chest discomfort and abd distention. He is now s/p exp lap (8/23), findings consistent with adynamic ileus, no obstruction found.  GI: NPO since admit 8/20, noted NGT in place, with ongoing output. No recent BM, pt cont on Reglan- please note that his QTc at baseline on 8/19 was 691 msec, but was 440 msec on strip this AM. Noted refeeding. Endo: No prior hx of DM, CBG controlled, <150- currently on SSI q 4h (ICU HG protocol). No TSH check this admit, cont on home levothyroxine.  Lytes: K dropped despite repletion, noted Phos low as well. Mg is WNL. Corrected Ca is 9. Other lytes ok. Noted 4 runs KCL and KPhos ordered per MD (total of ~70 Meq K). MIVF K d/c so patient can qualify for E-ICU electrolyte protocol. Goal K ~4, Phos ~3 and Mg ~2 with ileus. Renal: Noted Scr improved from time of admit, still elevated from baseline- dehydration vs. Hypoperfusion. UOP is ok (0.6 ml/kg/hr)  Pulm: 0.50 Fio2- pt sent to [facility] for tachypnea post-op  Cards: BP low, off pressors, not on shock steroids.  Hepatobil: LFTs WNL  at baseline. Alb low at 1.7- reflective of true value s/p hydration. Noted Chol is WNL, triglycerides higher than goal of 150, would watch closely with ongoing propofol > 94mcg/kg/min (risk of pancreatitis is high with triglyceride values 400-500 range).  Propofol infusing at 43mcg/kg/min (~101ml/hr) provides 720 Kcal from fat. Neuro: GCS 3, RASS -5 (goal -2); propofol for sedation  ID: Febrile, WBC incr. Noted empiric abx ordered  Azithromycin 8/21 >>8/22 Ceftriaxone 8/21 >> 8/22 Cefoxitin 8/22 x 1 dose pre-op Vanc 8/24 >> Zosyn 8/24 >> Levaquin 8/24 >>  8/22, 24: MRSA PCR neg  8/24: trach asp:  8/24: Blood:  Best Practices: SQ hep, oral care  Central access: CVC (triple lumen)- placed 8/23  Plan:  - Maintain Clinimix E 5/15 at 40cc/hr given refeeding- will plan to advance slowly to goal rate 83cc/hr as tolerated. -  Will plan on giving 15 mmol of Kphos on top of what was already ordered today (provides total K of 100 Meq today). Will plan to recheck K at 1800 today. - Will supplment MVI, select trace elements and IV lipids at 10cc/hr on MWF only d/t national shortages - Will add thiamine and Folate to TPN starting today, continue for the next 2 days (total 3). - F/up triglycerides closely- next check on Tuesday- would hold off on supplemented IV fats if propofol continues to provide + exceed required fat Kcals (goal is ~500 Kcal from fats 3 times/week). -Assess QTc and risk of continuing reglan if > 500 msec - Will f/up BMET, Mg, Phos and prealbumin in AM  Thanks, Ryn Peine K. Allena Katz, PharmD, BCPS.  Clinical Pharmacist Pager [phone]. 04/28/2012 7:13 AM

## 2012-04-29 ENCOUNTER — Inpatient Hospital Stay (HOSPITAL_COMMUNITY): Payer: Medicare PPO

## 2012-04-29 LAB — BASIC METABOLIC PANEL
BUN: 18 mg/dL (ref 6–23)
Calcium: 7.4 mg/dL — ABNORMAL LOW (ref 8.4–10.5)
Creatinine, Ser: 1.39 mg/dL — ABNORMAL HIGH (ref 0.50–1.35)
GFR calc non Af Amer: 48 mL/min — ABNORMAL LOW (ref >90)
Glucose, Bld: 184 mg/dL — ABNORMAL HIGH (ref 70–99)

## 2012-04-29 LAB — CBC
HCT: 32.1 % — ABNORMAL LOW (ref 39.0–52.0)
MCHC: 33.6 g/dL (ref 30.0–36.0)
MCV: 77.2 fL — ABNORMAL LOW (ref 78.0–100.0)
RDW: 16.3 % — ABNORMAL HIGH (ref 11.5–15.5)

## 2012-04-29 LAB — GLUCOSE, CAPILLARY
Glucose-Capillary: 177 mg/dL — ABNORMAL HIGH (ref 70–99)
Glucose-Capillary: 162 mg/dL — ABNORMAL HIGH (ref 70–99)

## 2012-04-29 LAB — BLOOD GAS, ARTERIAL
Bicarbonate: 26.7 mEq/L — ABNORMAL HIGH (ref 20.0–24.0)
O2 Saturation: 97.7 %
Patient temperature: 99
TCO2: 27.9 mmol/L (ref 0–100)
pO2, Arterial: 98.3 mmHg (ref 80.0–100.0)

## 2012-04-29 MED ORDER — FAT EMULSION 20 % IV EMUL
240.0000 mL | INTRAVENOUS | Status: AC
  Administered 2012-04-29: 240 mL via INTRAVENOUS
  Filled 2012-04-29: qty 250

## 2012-04-29 MED ORDER — ZINC TRACE METAL 1 MG/ML IV SOLN
INTRAVENOUS | Status: AC
  Administered 2012-04-29: 18:00:00 via INTRAVENOUS
  Filled 2012-04-29: qty 2000

## 2012-04-29 MED ORDER — MAGNESIUM SULFATE 40 MG/ML IJ SOLN
2.0000 g | Freq: Once | INTRAMUSCULAR | Status: AC
  Administered 2012-04-29: 2 g via INTRAVENOUS
  Filled 2012-04-29: qty 50

## 2012-04-29 MED ORDER — POTASSIUM CHLORIDE 10 MEQ/50ML IV SOLN
10.0000 meq | INTRAVENOUS | Status: AC
  Administered 2012-04-29 (×4): 10 meq via INTRAVENOUS
  Filled 2012-04-29 (×2): qty 100

## 2012-04-29 MED ORDER — METHYLPREDNISOLONE SODIUM SUCC 40 MG IJ SOLR
40.0000 mg | Freq: Three times a day (TID) | INTRAMUSCULAR | Status: DC
  Administered 2012-04-29 – 2012-04-30 (×2): 40 mg via INTRAVENOUS
  Filled 2012-04-29 (×5): qty 1

## 2012-04-29 MED ORDER — LEVOFLOXACIN IN D5W 750 MG/150ML IV SOLN
750.0000 mg | INTRAVENOUS | Status: AC
  Administered 2012-04-29 – 2012-05-03 (×3): 750 mg via INTRAVENOUS
  Filled 2012-04-29 (×3): qty 150

## 2012-04-29 NOTE — Progress Notes (Addendum)
PARENTERAL NUTRITION CONSULT NOTE - FOLLOW UP  Pharmacy Consult for TPN Indication: Adynamic ileus  No Known Allergies  Patient Measurements: Height: 5\' 2"  (157.5 cm) Weight: 186 lb 8.2 oz (84.6 kg) IBW/kg (Calculated) : 54.6  Adjusted Body Weight: 60 Kg  Vital Signs: Temp: 98.6 F (37 C) (08/26 0800) Temp src: Oral (08/26 0800) BP: 152/79 mmHg (08/26 0600) Pulse Rate: 70  (08/26 0600) Intake/Output from previous day: 08/25 0701 - 08/26 0700 In: 2823.5 [I.V.:391.5; NG/GT:90; IV Piggyback:1422; TPN:920] Out: 8090 [Urine:7590; Emesis/NG output:500] Intake/Output from this shift:    Labs:  Basename 04/29/12 0421 04/28/12 0405 04/27/12 1420  WBC 13.6* 11.4* 14.2*  HGB 10.8* 9.8* 10.8*  HCT 32.1* 29.8* 33.1*  PLT 209 174 201  APTT -- -- 29  INR -- -- 1.24     Basename 04/29/12 0421 04/28/12 1825 04/28/12 0405 04/27/12 1830 04/27/12 1420  NA 140 -- 140 137 --  K 3.4* 3.4* 2.9* -- --  CL 100 -- 110 107 --  CO2 29 -- 23 22 --  GLUCOSE 184* -- 125* 121* --  BUN 18 -- 13 13 --  CREATININE 1.39* -- 1.32 1.38* --  LABCREA -- -- -- -- --  CREAT24HRUR -- -- -- -- --  CALCIUM 7.4* -- 7.2* 6.9* --  MG 1.8 -- 2.0 -- 2.0  PHOS 4.6 -- 2.0* -- 3.4  PROT -- -- 5.1* -- --  ALBUMIN -- -- 1.7* -- --  AST -- -- 19 -- --  ALT -- -- 18 -- --  ALKPHOS -- -- 71 -- --  BILITOT -- -- 0.4 -- --  BILIDIR -- -- -- -- --  IBILI -- -- -- -- --  PREALBUMIN -- -- 6.2* -- --  TRIG -- -- 211* -- --  CHOLHDL -- -- -- -- --  CHOL -- -- 120 -- --   Estimated Creatinine Clearance: 42.6 ml/min (by C-G formula based on Cr of 1.39).    Basename 04/29/12 0743 04/29/12 0326 04/28/12 2351  GLUCAP 174* 177* 175*    Insulin Requirements in the past 12 hours:  9 units Novolog SSI  Current Nutrition: TPN (CLINIMIX E 5/15) at 40 mL/hr  NPO  Nutritional Goals (per RD):  1497 kCal, 95-110 grams of protein per day  Assessment: Patient admitted to [facility] with epigastric pain, chest discomfort and  abd distention. He is now s/p exp lap (8/23), findings consistent with adynamic ileus, no obstruction found.  GI: NPO since admit 8/20, noted NGT in place, with ongoing output. No recent BM, pt cont on Reglan- please note that his QTc at baseline on 8/19 was 691 msec, but was 450 msec last night. Refeeding seems to have resolved. Endo: No prior hx of DM, CBGs 174-181 (slightly above goal)-currently on SSI q 4h (ICU HG protocol). No TSH check this admit, cont on home levothyroxine.  Remains on steroids. Lytes: K seems to have stabilized - still below goal of >4 with ileus. Phos wnl.  Mg wnl but still slightly under goal for ileus. Corrected Ca is 9.2. Other lytes ok. Goal K ~4, Phos ~3 and Mg ~2 with ileus. Renal: Noted Scr improved from time of admit, still elevated from baseline- dehydration vs. hypoperfusion. UOP great yesterday (3.7 ml/kg/hr) with 3 doses IV lasix. Pulm: Extubated 8/25 Cards: BP quite variable. HR wnl.  Hepatobil: LFTs WNL at baseline. Alb low at 1.7- reflective of true value s/p hydration. Prealbumin 6.2 - low. Triglycerides higher than goal of 150, will watch  closely - should improve off of propofol. Neuro: GCS 14. Sedation d/c at extubation ID: Afeb, WBC incr. Empiric abx for PNA. Azithromycin 8/21 >>8/22 Ceftriaxone 8/21 >> 8/22 Cefoxitin 8/22 x 1 dose pre-op Vanc 8/24 >> Zosyn 8/24 >> Levaquin 8/24 >>  8/22, 24: MRSA PCR neg  8/24: trach asp: NGTD 8/24: Blood x2: NGTD Best Practices: SQ hep, oral care  Central access: CVC (triple lumen)- placed 8/23  Plan:  - Increase Clinimix E 5/15 to 60cc/hr - advance slowly to goal rate 80cc/hr as tolerated. Add lipids at 71ml/hr M/W/F now that pt is off of propofol. - KCl IV x 4 runs today - Magnesium 2mg  IV x 1 today - Will supplement MVI, select trace elements and IV lipids at 10cc/hr on MWF only d/t national shortages - Will add thiamine and Folate to TPN starting today, continue for total 3 days. - F/up  triglycerides closely - next check on Wednesday - Assess QTc and risk of continuing reglan if > 500 msec - Will f/up BMET, Mg in a.m.  Thanks, Christoper Fabian, PharmD, BCPS Clinical pharmacist, pager [phone] 04/29/2012 8:18 AM

## 2012-04-29 NOTE — Progress Notes (Signed)
3 Days Post-Op  Subjective: Patient extubated, conversant; no complaints of pain  Objective: Vital signs in last 24 hours: Temp:  [98 F (36.7 C)-99 F (37.2 C)] 99 F (37.2 C) (08/26 0400) Pulse Rate:  [70-104] 70  (08/26 0600) Resp:  [12-19] 12  (08/26 0600) BP: (117-179)/(61-94) 152/79 mmHg (08/26 0600) SpO2:  [96 %-100 %] 99 % (08/26 0600) FiO2 (%):  [40 %] 40 % (08/25 0809) Weight:  [186 lb 8.2 oz (84.6 kg)] 186 lb 8.2 oz (84.6 kg) (08/26 0400) Last BM Date: 04/22/12  Intake/Output from previous day: 08/25 0701 - 08/26 0700 In: 2823.5 [I.V.:391.5; NG/GT:90; IV Piggyback:1422; TPN:920] Out: 8090 [Urine:7590; Emesis/NG output:500] Intake/Output this shift:    General appearance: alert, cooperative and no distress GI: distended; firm; non-tender; absent bowel sounds Incision - clean, dry, intact  Lab Results:   Basename 04/29/12 0421 04/28/12 0405  WBC 13.6* 11.4*  HGB 10.8* 9.8*  HCT 32.1* 29.8*  PLT 209 174   BMET  Basename 04/29/12 0421 04/28/12 1825 04/28/12 0405  NA 140 -- 140  K 3.4* 3.4* --  CL 100 -- 110  CO2 29 -- 23  GLUCOSE 184* -- 125*  BUN 18 -- 13  CREATININE 1.39* -- 1.32  CALCIUM 7.4* -- 7.2*   PT/INR  Basename 04/27/12 1420  LABPROT 15.9*  INR 1.24   ABG  Basename 04/29/12 0325 04/28/12 0358  PHART 7.446 7.389  HCO3 26.7* 20.9    Studies/Results: Dg Chest Port 1 View  04/28/2012  *RADIOLOGY REPORT*  Clinical Data: Respiratory failure.  PORTABLE CHEST - 1 VIEW  Comparison: 04/27/2012  Findings: Endotracheal tube is 1.9 cm above the carina.  Patchy densities in the left lung are consistent with airspace disease. Nasogastric tube extends into the abdomen.  Left subclavian central line tip in the upper SVC region.  There are low lung volumes. Heart size is upper limits of normal.  Slightly improved aeration at the left lung base.  IMPRESSION: Patchy airspace densities, left side greater than right.  Slightly improved aeration at the left  lung base.  Support apparatuses as described.   Original Report Authenticated By: Richarda Overlie, M.D.    Portable Chest Xray  04/27/2012  *RADIOLOGY REPORT*  Clinical Data: Check endotracheal tube  PORTABLE CHEST - 1 VIEW  Comparison: 04/27/2012  Findings: Endotracheal tube terminates at/just above the carina. Withdrawal approximately 3 cm is suggested.  Patchy left upper/lower lobe opacities, suspicious for pneumonia. Possible small left pleural effusion.  No pneumothorax is seen.  Stable cardiomegaly.  Surgical clips overlying the bilateral neck.  IMPRESSION: Endotracheal tube terminates at/just above the carina.  Withdrawal approximately 3 cm is suggested.  Patchy left upper/lower lobe opacities, suspicious for pneumonia. Possible small left pleural effusion.   Original Report Authenticated By: Charline Bills, M.D.     Anti-infectives: Anti-infectives     Start     Dose/Rate Route Frequency Ordered Stop   04/28/12 1200   vancomycin (VANCOCIN) 750 mg in sodium chloride 0.9 % 150 mL IVPB        750 mg 150 mL/hr over 60 Minutes Intravenous Every 12 hours 04/28/12 1149     04/27/12 1600   vancomycin (VANCOCIN) 1,500 mg in sodium chloride 0.9 % 500 mL IVPB        1,500 mg 250 mL/hr over 120 Minutes Intravenous  Once 04/27/12 1414 04/27/12 1821   04/27/12 1500   levofloxacin (LEVAQUIN) IVPB 750 mg        750 mg 100  mL/hr over 90 Minutes Intravenous Every 48 hours 04/27/12 1414     04/27/12 1445   piperacillin-tazobactam (ZOSYN) IVPB 3.375 g        3.375 g 12.5 mL/hr over 240 Minutes Intravenous 3 times per day 04/27/12 1414     04/25/12 1605   cefOXitin (MEFOXIN) 2 g in dextrose 5 % 50 mL IVPB  Status:  Discontinued        2 g 100 mL/hr over 30 Minutes Intravenous 60 min pre-op 04/25/12 1605 04/26/12 1039   04/24/12 0930   azithromycin (ZITHROMAX) 500 mg in dextrose 5 % 250 mL IVPB  Status:  Discontinued        500 mg 250 mL/hr over 60 Minutes Intravenous Every 24 hours 04/24/12 0753  04/25/12 1251   04/24/12 0900   cefTRIAXone (ROCEPHIN) 1 g in dextrose 5 % 50 mL IVPB  Status:  Discontinued        1 g 100 mL/hr over 30 Minutes Intravenous Every 24 hours 04/24/12 0753 04/25/12 1252          Assessment/Plan: s/p Procedure(s) (LRB): EXPLORATORY LAPAROTOMY (N/A) INSERTION CENTRAL LINE ADULT (Left) Post-operative ileus - no bowel function yet Incision OK - no need for dressing at this time. Will follow.   LOS: 7 days    Victor Hunter K. 04/29/2012

## 2012-04-29 NOTE — Progress Notes (Signed)
Nutrition Follow-up / Consult for new TPN  Intervention:  RD to follow TPN with Pharmacy to meet nutrition goals as able during national lipid shortage.  Assessment:   Patient was transferred from [facility].  TPN was initiated 8/24 for adynamic ileus. Remains intubated on ventilator support.  MV: 8.2 Temp:Temp (24hrs), Avg:98.6 F (37 C), Min:98 F (36.7 C), Max:99 F (37.2 C)  Propofol is off.  Patient is receiving TPN with Clinimix E 5/15 @ 40 ml/hr, increasing to 60 ml/h today.  Lipids (20% IVFE @ 10 ml/hr), multivitamins, and trace elements are provided 3 times weekly (MWF) due to national backorder.  Will provide 1228 kcal and 72 grams protein daily (based on weekly average).  Meets 76% minimum estimated kcal and 80% minimum estimated protein needs.  Diet Order:  NPO  Meds: Scheduled Meds:   . ipratropium  0.5 mg Nebulization QID   And  . albuterol  2.5 mg Nebulization Q4H  . antiseptic oral rinse  15 mL Mouth Rinse QID  . chlorhexidine  15 mL Mouth Rinse BID  . furosemide  40 mg Intravenous Q6H  . heparin  5,000 Units Subcutaneous Q8H  . insulin aspart  0-4 Units Subcutaneous Q4H  . levofloxacin (LEVAQUIN) IV  750 mg Intravenous Q48H  . levothyroxine  62.5 mcg Intravenous Daily  . magnesium sulfate 1 - 4 g bolus IVPB  2 g Intravenous Once  . methylPREDNISolone (SOLU-MEDROL) injection  40 mg Intravenous Q6H  . metoCLOPramide (REGLAN) injection  5 mg Intravenous Q6H  . mupirocin ointment  1 application Nasal BID  . pantoprazole (PROTONIX) IV  40 mg Intravenous QAC breakfast  . piperacillin-tazobactam (ZOSYN)  IV  3.375 g Intravenous Q8H  . potassium chloride  10 mEq Intravenous Q1 Hr x 4  . potassium chloride  10 mEq Intravenous Q1 Hr x 4  . potassium phosphate IVPB (mmol)  15 mmol Intravenous Once  . potassium phosphate IVPB (mmol)  15 mmol Intravenous Once  . vancomycin  750 mg Intravenous Q12H  . DISCONTD: potassium chloride  40 mEq Per Tube TID   Continuous Infusions:    . fat emulsion    . norepinephrine (LEVOPHED) Adult infusion Stopped (04/27/12 1945)  . propofol 60.049 mcg/kg/min (04/28/12 0900)  . TPN (CLINIMIX) +/- additives 40 mL/hr at 04/27/12 1744  . TPN (CLINIMIX) +/- additives 40 mL/hr at 04/28/12 1715  . TPN (CLINIMIX) +/- additives    . DISCONTD: dextrose     PRN Meds:.sodium chloride, fentaNYL, ondansetron, DISCONTD: sodium chloride, DISCONTD: dextrose  Labs:  CMP     Component Value Date/Time   NA 140 04/29/2012 0421   K 3.4* 04/29/2012 0421   CL 100 04/29/2012 0421   CO2 29 04/29/2012 0421   GLUCOSE 184* 04/29/2012 0421   BUN 18 04/29/2012 0421   CREATININE 1.39* 04/29/2012 0421   CALCIUM 7.4* 04/29/2012 0421   PROT 5.1* 04/28/2012 0405   ALBUMIN 1.7* 04/28/2012 0405   AST 19 04/28/2012 0405   ALT 18 04/28/2012 0405   ALKPHOS 71 04/28/2012 0405   BILITOT 0.4 04/28/2012 0405   GFRNONAA 48* 04/29/2012 0421   GFRAA 55* 04/29/2012 0421   CBG (last 3)   Basename 04/29/12 0743 04/29/12 0326 04/28/12 2351  GLUCAP 174* 177* 175*    Sodium  Date/Time Value Range Status  04/29/2012  4:21 AM 140  135 - 145 mEq/L Final  04/28/2012  4:05 AM 140  135 - 145 mEq/L Final  04/27/2012  6:30 PM 137  135 -  145 mEq/L Final    Potassium  Date/Time Value Range Status  04/29/2012  4:21 AM 3.4* 3.5 - 5.1 mEq/L Final  04/28/2012  6:25 PM 3.4* 3.5 - 5.1 mEq/L Final  04/28/2012  4:05 AM 2.9* 3.5 - 5.1 mEq/L Final    Phosphorus  Date/Time Value Range Status  04/29/2012  4:21 AM 4.6  2.3 - 4.6 mg/dL Final  1/61/0960  4:54 AM 2.0* 2.3 - 4.6 mg/dL Final  0/98/1191  4:78 PM 3.4  2.3 - 4.6 mg/dL Final    Magnesium  Date/Time Value Range Status  04/29/2012  4:21 AM 1.8  1.5 - 2.5 mg/dL Final  2/95/6213  0:86 AM 2.0  1.5 - 2.5 mg/dL Final  5/78/4696  2:95 PM 2.0  1.5 - 2.5 mg/dL Final     Intake/Output Summary (Last 24 hours) at 04/29/12 1011 Last data filed at 04/29/12 1000  Gross per 24 hour  Intake   2593 ml  Output   8125 ml  Net  -5532 ml     Weight Status:  84.6 kg up from 81.6 kg on 8/23 likely related to overall fluid retention.  BMI=34 (obesity-class 1)  Re-estimated needs:  1620 kcals, 90-105 grams protein, 1.6-1.8 liters fluid  Calorie goal for permissive underfeeding is 1180-1340 kcals/day.  Nutrition Dx:  Inadequate oral intake related to inability to eat as evidenced by NPO status, ongoing.  Goal:  Parenteral nutrition to provide 60-70% of estimated calorie needs (22-25 kcals/kg ideal body weight) and >/= 90% of estimated protein needs, based on ASPEN guidelines for permissive underfeeding in critically ill obese individuals.  Monitor:  TPN tolerance, labs, weight trend, ability to transition to enteral nutrition.   Joaquin Courts, RD, CNSC, LDN Pager# [phone] After Hours Pager# [phone]

## 2012-04-29 NOTE — Evaluation (Signed)
Physical Therapy Evaluation Patient Details Name: Victor Hunter MRN: [id number] DOB: 05-08-1934 Today's Date: 04/29/2012 Time: 4098-1191 PT Time Calculation (min): 29 min  PT Assessment / Plan / Recommendation Clinical Impression  pt presents with Ileus, PNA, and Resp Failure.  pt very debilitated, but motivated to improve.  pt would benefit from CIR at D/C to maximize Independence prior to return to home.      PT Assessment  Patient needs continued PT services    Follow Up Recommendations  Inpatient Rehab    Barriers to Discharge None      Equipment Recommendations  Rolling walker with 5" wheels    Recommendations for Other Services Rehab consult;OT consult   Frequency Min 3X/week    Precautions / Restrictions Precautions Precautions: Fall (NG tube) Restrictions Weight Bearing Restrictions: No   Pertinent Vitals/Pain Denies pain at rest, indicates stomach discomfort during mobility.        Mobility  Bed Mobility Bed Mobility: Rolling Left;Left Sidelying to Sit;Sitting - Scoot to Edge of Bed Rolling Left: 3: Mod assist Left Sidelying to Sit: 3: Mod assist Sitting - Scoot to Edge of Bed: 3: Mod assist Details for Bed Mobility Assistance: pt attempts to use UEs and needs cues for sequencing.   Transfers Transfers: Sit to Stand;Stand to Sit;Stand Pivot Transfers Sit to Stand: 1: +2 Total assist;With upper extremity assist;From bed Sit to Stand: Patient Percentage: 50% Stand to Sit: 1: +2 Total assist;With upper extremity assist;To chair/3-in-1;With armrests Stand to Sit: Patient Percentage: 60% Stand Pivot Transfers: 1: +2 Total assist Stand Pivot Transfers: Patient Percentage: 50% Details for Transfer Assistance: cues for use of UEs, step-by-step safe technique.   Ambulation/Gait Ambulation/Gait Assistance: Not tested (comment) Stairs: No Wheelchair Mobility Wheelchair Mobility: No    Exercises     PT Diagnosis: Difficulty walking;Generalized weakness  PT  Problem List: Decreased strength;Decreased activity tolerance;Decreased balance;Decreased mobility;Decreased knowledge of use of DME;Cardiopulmonary status limiting activity;Pain PT Treatment Interventions: DME instruction;Gait training;Stair training;Functional mobility training;Therapeutic activities;Therapeutic exercise;Balance training;Patient/family education   PT Goals Acute Rehab PT Goals PT Goal Formulation: With patient Time For Goal Achievement: 05/13/12 Potential to Achieve Goals: Good Pt will Roll Supine to Right Side: with modified independence PT Goal: Rolling Supine to Right Side - Progress: Goal set today Pt will Roll Supine to Left Side: with modified independence PT Goal: Rolling Supine to Left Side - Progress: Goal set today Pt will go Supine/Side to Sit: with modified independence PT Goal: Supine/Side to Sit - Progress: Goal set today Pt will go Sit to Supine/Side: with modified independence PT Goal: Sit to Supine/Side - Progress: Goal set today Pt will go Sit to Stand: with modified independence PT Goal: Sit to Stand - Progress: Goal set today Pt will go Stand to Sit: with modified independence PT Goal: Stand to Sit - Progress: Goal set today Pt will Ambulate: >150 feet;with modified independence;with rolling walker PT Goal: Ambulate - Progress: Goal set today Pt will Go Up / Down Stairs: 3-5 stairs;with supervision;with rail(s) PT Goal: Up/Down Stairs - Progress: Goal set today  Visit Information  Last PT Received On: 04/29/12 Assistance Needed: +2    Subjective Data  Subjective: pt with short answers to questions, but agreeable to PT.   Patient Stated Goal: Back to normal.     Prior Functioning  Home Living Lives With:  Lucila Maine) Available Help at Discharge: Family;Available PRN/intermittently Type of Home: House Home Access: Stairs to enter [employer] of Steps: 2-3 Entrance Stairs-Rails: None Home Layout: One  level Home Adaptive  Equipment: None Prior Function Level of Independence: Needs assistance Needs Assistance: Dressing;Meal Prep;Light Housekeeping Dressing: Minimal (Grandson A with son types of shoes and tight socks.  ) Meal Prep: Minimal Light Housekeeping: Minimal Able to Take Stairs?: Yes Driving: Yes Vocation: Retired Comments: Grandson A with heavy chores.   Communication Communication: No difficulties    Cognition  Overall Cognitive Status: Appears within functional limits for tasks assessed/performed Arousal/Alertness: Awake/alert Orientation Level: Disoriented to;Time Behavior During Session: WFL for tasks performed    Extremity/Trunk Assessment Right Lower Extremity Assessment RLE ROM/Strength/Tone: Deficits RLE ROM/Strength/Tone Deficits: Generally weak, 4/5 Left Lower Extremity Assessment LLE ROM/Strength/Tone: Deficits LLE ROM/Strength/Tone Deficits: Generally weak, 4/5 Trunk Assessment Trunk Assessment: Normal   Balance Balance Balance Assessed: No  End of Session PT - End of Session Equipment Utilized During Treatment: Gait belt Activity Tolerance: Patient limited by fatigue Patient left: in chair;with call bell/phone within reach Nurse Communication: Mobility status  GP     Sunny Schlein, [place] [phone] 04/29/2012, 12:55 PM

## 2012-04-29 NOTE — Progress Notes (Signed)
RT instructed pt to Incentive spirometry. Pt gave good effort and knowledge about doing it. Pt was able to pull consistently. RT did instruction and pt understood.

## 2012-04-29 NOTE — Progress Notes (Signed)
Name: Victor Hunter MRN: [id number] DOB: [birth year]    LOS: 7  Referring Provider:  Juanetta Gosling Reason for Referral:  resp failure   PULMONARY / CRITICAL CARE MEDICINE  Brief patient description:    78 year old male admitted to [facility] on 8/19 w/ SBO. Pre-op course complicated by CAP vs aspiration. Went to OR on 8/23, Findings: Adynamic ileus. On 8/24 unable to wean so transferred to [facility] for further eval.   Current Status: Extubated 8/25  Vital Signs: Temp:  [98 F (36.7 C)-99 F (37.2 C)] 98.6 F (37 C) (08/26 0800) Pulse Rate:  [68-98] 74  (08/26 1200) Resp:  [11-20] 18  (08/26 1200) BP: (130-179)/(68-94) 164/80 mmHg (08/26 1200) SpO2:  [96 %-100 %] 99 % (08/26 1200) Weight:  [84.6 kg (186 lb 8.2 oz)] 84.6 kg (186 lb 8.2 oz) (08/26 0400)  Physical Examination: General:  Obese 78 year old aam. No distress Neuro: cooperative HEENT:  obese Neck:  large Cardiovascular:  rrr Lungs:  Ronchi, coughing Abdomen:  Distended. Hypoactive. NGT in place.  Musculoskeletal:  intact Skin:  + dependant edema  GU: foley cath, with clear urine.   Active Problems:  Acute respiratory failure  Respiratory alkalosis  Aspiration pneumonia  Hypokalemia  Acute renal failure  S/P exploratory laparotomy  Adynamic ileus   ASSESSMENT AND PLAN  PULMONARY  Lab 04/29/12 0325 04/28/12 0358 04/27/12 1858 04/27/12 1340 04/27/12 0421  PHART 7.446 7.389 7.670* 7.335* 7.709*  PCO2ART 39.4 34.7* 18.1* 45.4* 17.1*  PO2ART 98.3 86.0 79.0* 113.0* 58.2*  HCO3 26.7* 20.9 20.9 24.2* 22.2  O2SAT 97.7 96.0 98.0 98.0 95.8   Ventilator Settings:   CXR:  Improved int infil left ETT:  8/23>>>8/25  1) Post-op acute respiratory failure 2) Aspiration PNA 3) Respiratory alkalosis Post-op respiratory failure. Likely multifactorial. PNA, pain, hypoxia.  P:   Diurese as able IS Upright , chair position pcxr improved Rt concerned wheezing increased with BDer's, dc  CARDIOVASCULAR  Lab 04/27/12 1420  04/22/12 1319  TROPONINI -- <0.30  LATICACIDVEN 0.9 --  PROBNP 132.0 --   ECG:   Lines: left subclavian CVL 8/23 at ap in OR>>>  1)  Hypotension/ sirs. Septic shock: resolved P:  Plan Off pressors Tele  RENAL  Lab 04/29/12 0421 04/28/12 1825 04/28/12 0405 04/27/12 1830 04/27/12 1420 04/27/12 0900 04/26/12 0450  NA 140 -- 140 137 -- 139 141  K 3.4* 3.4* -- -- -- -- --  CL 100 -- 110 107 -- 107 104  CO2 29 -- 23 22 -- 24 30  BUN 18 -- 13 13 -- 14 10  CREATININE 1.39* -- 1.32 1.38* 1.63* 1.42* --  CALCIUM 7.4* -- 7.2* 6.9* -- 6.8* 7.6*  MG 1.8 -- 2.0 -- 2.0 -- --  PHOS 4.6 -- 2.0* -- 3.4 -- --   Intake/Output      08/25 0701 - 08/26 0700 08/26 0701 - 08/27 0700   I.V. (mL/kg) 391.5 (4.6)    NG/GT 90 30   IV Piggyback 1422 185   TPN 920 120   Total Intake(mL/kg) 2823.5 (33.4) 335 (4)   Urine (mL/kg/hr) 7590 (3.7) 125 (0.3)   Emesis/NG output 500    Total Output 8090 125   Net -5266.6 +210         Foley:    A:  1)  Hypokalemia       2) acute renal failure  Likely scr bump due to hypotension.  P:   Dc lasix, neg gross and rise bun.  crt Chem, in am  Kvo, tpn  GASTROINTESTINAL  Lab 04/28/12 0405 04/26/12 0450  AST 19 21  ALT 18 20  ALKPHOS 71 93  BILITOT 0.4 0.4  PROT 5.1* 6.1  ALBUMIN 1.7* 2.5*    1) A:  Adynamic ileus 2) s/p exploratory lap 8/23 P: Keep NPO for now, TPN PPI Dc tpn ASAP when activity increases  HEMATOLOGIC  Lab 04/29/12 0421 04/28/12 0405 04/27/12 1420 04/27/12 0900 04/26/12 0450  HGB 10.8* 9.8* 10.8* 10.2* 11.7*  HCT 32.1* 29.8* 33.1* 30.3* 35.7*  PLT 209 174 201 175 200  INR -- -- 1.24 -- --  APTT -- -- 29 -- --   A:  No acute evidence of bleeding P:  hemoconcetrated Sub q hep  INFECTIOUS  Lab 04/29/12 0421 04/28/12 0405 04/27/12 1420 04/27/12 0900 04/26/12 0450  WBC 13.6* 11.4* 14.2* 10.8* 9.9  PROCALCITON -- -- 0.52 -- --   Culture data:   Mrsa PCR 8/22: Neg SA surgical screen 8/22: positive Sputum 8/24>>> bcx  2 8/24>>>  Urine strep antigen 8/24>> Urine legionella antigen 8/24>>>  ABX:  Azithro 8/20>>>8/21 Rocephin 8/20>>>8/21 Zosyn 8/24>>>8/26 Vanc 8/24>>>>8/26 Levaquin 8/24>>>   A:  Pneumonia cap vs aspiration, no truie abdo source P: Neg culture Dc vanc, zosyn Levo to add stop date total 8 days  ENDOCRINE  Lab 04/29/12 0743 04/29/12 0326 04/28/12 2351 04/28/12 1931 04/28/12 1520  GLUCAP 174* 177* 175* 181* 169*   A: mild hyperglycemia  P: SSI   NEUROLOGIC  A:  1)  Acute encephalopathy       2) pain P:   Supportive care.  Pt, upright  Consider transfer sdu  Mcarthur Rossetti. Tyson Alias, MD, FACP Pgr: [phone] [facility]

## 2012-04-30 ENCOUNTER — Encounter (HOSPITAL_COMMUNITY): Payer: Self-pay | Admitting: General Surgery

## 2012-04-30 ENCOUNTER — Inpatient Hospital Stay (HOSPITAL_COMMUNITY): Payer: Medicare PPO

## 2012-04-30 DIAGNOSIS — J9600 Acute respiratory failure, unspecified whether with hypoxia or hypercapnia: Secondary | ICD-10-CM

## 2012-04-30 LAB — CBC WITH DIFFERENTIAL/PLATELET
Basophils Absolute: 0 10*3/uL (ref 0.0–0.1)
Basophils Relative: 0 % (ref 0–1)
Eosinophils Absolute: 0 10*3/uL (ref 0.0–0.7)
MCH: 25.7 pg — ABNORMAL LOW (ref 26.0–34.0)
MCHC: 32.8 g/dL (ref 30.0–36.0)
Monocytes Absolute: 1.2 10*3/uL — ABNORMAL HIGH (ref 0.1–1.0)
Neutrophils Relative %: 86 % — ABNORMAL HIGH (ref 43–77)
Platelets: 261 10*3/uL (ref 150–400)
RDW: 16 % — ABNORMAL HIGH (ref 11.5–15.5)

## 2012-04-30 LAB — COMPREHENSIVE METABOLIC PANEL
ALT: 56 U/L — ABNORMAL HIGH (ref 0–53)
AST: 56 U/L — ABNORMAL HIGH (ref 0–37)
Alkaline Phosphatase: 108 U/L (ref 39–117)
CO2: 30 mEq/L (ref 19–32)
Chloride: 102 mEq/L (ref 96–112)
GFR calc non Af Amer: 58 mL/min — ABNORMAL LOW (ref >90)
Glucose, Bld: 205 mg/dL — ABNORMAL HIGH (ref 70–99)
Sodium: 140 mEq/L (ref 135–145)
Total Bilirubin: 0.2 mg/dL — ABNORMAL LOW (ref 0.3–1.2)

## 2012-04-30 LAB — GLUCOSE, CAPILLARY
Glucose-Capillary: 185 mg/dL — ABNORMAL HIGH (ref 70–99)
Glucose-Capillary: 170 mg/dL — ABNORMAL HIGH (ref 70–99)

## 2012-04-30 LAB — MAGNESIUM: Magnesium: 2.5 mg/dL (ref 1.5–2.5)

## 2012-04-30 LAB — CULTURE, RESPIRATORY W GRAM STAIN

## 2012-04-30 MED ORDER — POTASSIUM CHLORIDE 10 MEQ/50ML IV SOLN
10.0000 meq | INTRAVENOUS | Status: AC
  Administered 2012-04-30 (×3): 10 meq via INTRAVENOUS
  Filled 2012-04-30: qty 150

## 2012-04-30 MED ORDER — METHYLPREDNISOLONE SODIUM SUCC 40 MG IJ SOLR
20.0000 mg | Freq: Two times a day (BID) | INTRAMUSCULAR | Status: DC
  Administered 2012-04-30 – 2012-05-01 (×2): 20 mg via INTRAVENOUS
  Filled 2012-04-30 (×3): qty 0.5

## 2012-04-30 MED ORDER — INSULIN REGULAR HUMAN 100 UNIT/ML IJ SOLN
INTRAVENOUS | Status: AC
  Administered 2012-04-30: 18:00:00 via INTRAVENOUS
  Filled 2012-04-30: qty 2000

## 2012-04-30 MED ORDER — FUROSEMIDE 10 MG/ML IJ SOLN
20.0000 mg | Freq: Once | INTRAMUSCULAR | Status: AC
  Administered 2012-04-30: 20 mg via INTRAVENOUS
  Filled 2012-04-30: qty 2

## 2012-04-30 MED ORDER — BISACODYL 10 MG RE SUPP
10.0000 mg | Freq: Once | RECTAL | Status: AC
  Administered 2012-04-30: 10 mg via RECTAL
  Filled 2012-04-30: qty 1

## 2012-04-30 NOTE — Clinical Social Work Psychosocial (Signed)
     Clinical Social Work Department BRIEF PSYCHOSOCIAL ASSESSMENT 04/30/2012  Patient:  KAIEN, PEZZULLO     Account Number:  [id number]     Admit date:  04/22/2012  Clinical Social Worker:  Margaree Mackintosh  Date/Time:  04/30/2012 03:00 PM  Referred by:  Care Management  Date Referred:  04/30/2012 Referred for  SNF Placement   Other Referral:   Interview type:  Family Other interview type:    PSYCHOSOCIAL DATA Living Status:  FAMILY Admitted from facility:   Level of care:   Primary support name:  GarryAnne Primary support relationship to patient:  SPOUSE Degree of support available:   Adequate.    CURRENT CONCERNS Current Concerns  Post-Acute Placement   Other Concerns:    SOCIAL WORK ASSESSMENT / PLAN Clinical Social Worker staffed case with RNCM.  CSW reviewed chart and met with pt's wife Acquanetta Belling) and step-dtr.  CSW and Intern-Angela introduced self, explained role, and provided support.  Pt and family agreeable to Intern being present during assessment.  CSW reviewed PT recommendations.  Wife agreeable to SNF as back up; pt currently appears asleep.  CSW provided opportunity for wife to address questions/concenrs related to SNF process. CSW to fax pt out and continue to follow and assist as needed.   Assessment/plan status:  Information/Referral to [facility] Other assessment/ plan:   Information/referral to community resources:   SNF    PATIENTS/FAMILYS RESPONSE TO PLAN OF CARE: Pt resting during assessment.  Family was pleasant and engaged.  Wife shared how they met and strengths of their relationship.  Wife thanked CSW for intervention.

## 2012-04-30 NOTE — Consult Note (Signed)
Physical Medicine and Rehabilitation Consult Reason for Consult: Deconditioned Referring Physician:  Dr. Tyson Alias   HPI: Victor Hunter is a 78 y.o. male with history of HTN, anemia, who was originally admitted to [facility] 04/22/12 with epigastric and substernal pain due to SBO. Hospital course complicated by aspiration v/s community acquired PNA.  Continued with  Abdominal distension requiring exploratory Lap in OR 04/26/12 revealing adynamic ileus. Post op had tachypnea and difficult vent wean therefore transferred to [facility] on 08/24. Maintained on multiple antibiotics and pressors for SIRS due to septic shock.   Extubated on 08/25 and antibiotics narrowed to Levaquin today. NGT in place as without improvement in ileus and TNA initiated on 08/26. PT evaluation done yesterday and patient noted to be deconditioned.  MD, PT recommending CIR.  Review of Systems  Constitutional: Positive for malaise/fatigue.  Respiratory: Negative for shortness of breath.   Cardiovascular: Negative for chest pain.  Gastrointestinal: Positive for nausea, abdominal pain and constipation (chronic).        H/O Chronic intermittent hiccups.  Musculoskeletal: Negative for myalgias and back pain.  Neurological: Positive for weakness.  All other systems reviewed and are negative.   Past Medical History  Diagnosis Date  . Hypertension   . GERD (gastroesophageal reflux disease)   . Thyroid disease   . Upper GI bleed   . Ileus   . Mallory - Weiss tear   . Chronic anemia   . Enlarged prostate     Elevated PSA  . 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 Opal Sidles benign biopsies February 2005  . History of BPH     s/p TURP 2008   Past Surgical History  Procedure Date  . Thyroidectomy   . Cholecystectomy   . Esophagogastroduodenoscopy 08/31/08    Rourk-possible extrinsic impression on midesophagus, noncritical Schatzki's ring, 3 mm fundal gland  polyp, multiple gastric fundic polyps not manipulated, small hiatal hernia  . Colonoscopy 10/29/03    rourk-inflammatory polyp from 40 cm removed  . Knee arthroscopy     pt denies  . Transurethral resection of prostate     pt denies, but this is listed in his past medical records.    Family History  Problem Relation Age of Onset  . Cancer Other   . Heart failure Other   . Liver disease Sister   . Breast cancer Sister   . Hypertension Mother   . Stroke Father   . Colon cancer Neg Hx    Social History: Single. Independent and active without AD PTA  Grandson who lives with him works in the evenings. He  reports that he has quit smoking. His smoking use included Cigarettes. He smoked .5 packs per day. He has never used smokeless tobacco. He reports that he does not drink alcohol or use illicit drugs.  Allergies: No Known Allergies  Medications Prior to Admission  Medication Sig Dispense Refill  . aspirin EC 81 MG tablet Take 81 mg by mouth daily.      . calcium carbonate (TUMS - DOSED IN MG ELEMENTAL CALCIUM) 500 MG chewable tablet Chew 1 tablet by mouth daily as needed. Acid Reflux      . diltiazem (DILACOR XR) 180 MG 24 hr capsule Take 180 mg by mouth daily.      . hydrochlorothiazide (HYDRODIURIL) 25 MG tablet Take 25 mg by mouth daily.      Marland Kitchen levothyroxine (SYNTHROID, LEVOTHROID) 125 MCG tablet Take 125  mcg by mouth daily.      . potassium chloride SA (K-DUR,KLOR-CON) 20 MEQ tablet Take 20 mEq by mouth daily.      . Tamsulosin HCl (FLOMAX) 0.4 MG CAPS Take 0.4 mg by mouth daily after supper.        Home: Home Living Lives With:  Lucila Maine) Available Help at Discharge: Family;Available PRN/intermittently Type of Home: House Home Access: Stairs to enter [employer] of Steps: 2-3 Entrance Stairs-Rails: None Home Layout: One level Home Adaptive Equipment: None  Functional History: Prior Function Dressing: Minimal (Grandson A with son types of shoes and tight socks.   ) Meal Prep: Minimal Light Housekeeping: Minimal Able to Take Stairs?: Yes Driving: Yes Vocation: Retired Comments: Grandson A with heavy chores.   Functional Status:  Mobility: Bed Mobility Bed Mobility: Rolling Left;Left Sidelying to Sit;Sitting - Scoot to Edge of Bed Rolling Left: 3: Mod assist Left Sidelying to Sit: 3: Mod assist Sitting - Scoot to Edge of Bed: 3: Mod assist Transfers Transfers: Sit to Stand;Stand to Sit;Stand Pivot Transfers Sit to Stand: 1: +2 Total assist;With upper extremity assist;From bed Sit to Stand: Patient Percentage: 50% Stand to Sit: 1: +2 Total assist;With upper extremity assist;To chair/3-in-1;With armrests Stand to Sit: Patient Percentage: 60% Stand Pivot Transfers: 1: +2 Total assist Stand Pivot Transfers: Patient Percentage: 50% Ambulation/Gait Ambulation/Gait Assistance: Not tested (comment) Stairs: No Wheelchair Mobility Wheelchair Mobility: No  ADL:    Cognition: Cognition Arousal/Alertness: Awake/alert Orientation Level: Oriented X4 Cognition Overall Cognitive Status: Appears within functional limits for tasks assessed/performed Arousal/Alertness: Awake/alert Orientation Level: Disoriented to;Time Behavior During Session: WFL for tasks performed  Blood pressure 157/71, pulse 63, temperature 98.6 F (37 C), temperature source Oral, resp. rate 18, height 5\' 2"  (1.575 m), weight 84.2 kg (185 lb 10 oz), SpO2 99.00%. Physical Exam  Nursing note and vitals reviewed. Constitutional: He appears well-developed and well-nourished. He is easily aroused. He has a sickly appearance.       Persistent hiccups noted. NGT in place. obese  HENT:  Head: Normocephalic and atraumatic.       ngt in place to intermittent suction.   Eyes: Pupils are equal, round, and reactive to light.  Neck: Normal range of motion.  Cardiovascular: Normal rate and regular rhythm.   Pulmonary/Chest: Effort normal.  Abdominal: He exhibits distension. There is no  tenderness.  Musculoskeletal: He exhibits edema (1+ left hand).  Neurological: He is easily aroused. He has normal reflexes. No cranial nerve deficit or sensory deficit.        Follows basic commands without difficulty. Moves all four. UE 3-4. LE 3-4.   Skin: Skin is warm and dry.    Results for orders placed during the hospital encounter of 04/22/12 (from the past 24 hour(s))  GLUCOSE, CAPILLARY     Status: Abnormal   Collection Time   04/29/12 12:32 PM      Component Value Range   Glucose-Capillary 191 (*) 70 - 99 mg/dL   Comment 1 Notify RN    GLUCOSE, CAPILLARY     Status: Abnormal   Collection Time   04/29/12  4:00 PM      Component Value Range   Glucose-Capillary 162 (*) 70 - 99 mg/dL   Comment 1 Notify RN    GLUCOSE, CAPILLARY     Status: Abnormal   Collection Time   04/29/12  7:36 PM      Component Value Range   Glucose-Capillary 134 (*) 70 - 99 mg/dL  GLUCOSE, CAPILLARY  Status: Abnormal   Collection Time   04/30/12 12:31 AM      Component Value Range   Glucose-Capillary 175 (*) 70 - 99 mg/dL   Comment 1 Documented in Chart     Comment 2 Notify RN    GLUCOSE, CAPILLARY     Status: Abnormal   Collection Time   04/30/12  3:54 AM      Component Value Range   Glucose-Capillary 196 (*) 70 - 99 mg/dL   Comment 1 Documented in Chart     Comment 2 Notify RN    MAGNESIUM     Status: Normal   Collection Time   04/30/12  4:39 AM      Component Value Range   Magnesium 2.5  1.5 - 2.5 mg/dL  COMPREHENSIVE METABOLIC PANEL     Status: Abnormal   Collection Time   04/30/12  4:39 AM      Component Value Range   Sodium 140  135 - 145 mEq/L   Potassium 3.9  3.5 - 5.1 mEq/L   Chloride 102  96 - 112 mEq/L   CO2 30  19 - 32 mEq/L   Glucose, Bld 205 (*) 70 - 99 mg/dL   BUN 24 (*) 6 - 23 mg/dL   Creatinine, Ser 1.61  0.50 - 1.35 mg/dL   Calcium 8.0 (*) 8.4 - 10.5 mg/dL   Total Protein 6.6  6.0 - 8.3 g/dL   Albumin 2.4 (*) 3.5 - 5.2 g/dL   AST 56 (*) 0 - 37 U/L   ALT 56 (*) 0 -  53 U/L   Alkaline Phosphatase 108  39 - 117 U/L   Total Bilirubin 0.2 (*) 0.3 - 1.2 mg/dL   GFR calc non Af Amer 58 (*) >90 mL/min   GFR calc Af Amer 67 (*) >90 mL/min  CBC WITH DIFFERENTIAL     Status: Abnormal   Collection Time   04/30/12  4:39 AM      Component Value Range   WBC 20.3 (*) 4.0 - 10.5 K/uL   RBC 4.24  4.22 - 5.81 MIL/uL   Hemoglobin 10.9 (*) 13.0 - 17.0 g/dL   HCT 09.6 (*) 04.5 - 40.9 %   MCV 78.3  78.0 - 100.0 fL   MCH 25.7 (*) 26.0 - 34.0 pg   MCHC 32.8  30.0 - 36.0 g/dL   RDW 81.1 (*) 91.4 - 78.2 %   Platelets 261  150 - 400 K/uL   Neutrophils Relative 86 (*) 43 - 77 %   Lymphocytes Relative 8 (*) 12 - 46 %   Monocytes Relative 6  3 - 12 %   Eosinophils Relative 0  0 - 5 %   Basophils Relative 0  0 - 1 %   Neutro Abs 17.5 (*) 1.7 - 7.7 K/uL   Lymphs Abs 1.6  0.7 - 4.0 K/uL   Monocytes Absolute 1.2 (*) 0.1 - 1.0 K/uL   Eosinophils Absolute 0.0  0.0 - 0.7 K/uL   Basophils Absolute 0.0  0.0 - 0.1 K/uL   RBC Morphology POLYCHROMASIA PRESENT    GLUCOSE, CAPILLARY     Status: Abnormal   Collection Time   04/30/12  7:54 AM      Component Value Range   Glucose-Capillary 185 (*) 70 - 99 mg/dL   Dg Chest Port 1 View  04/30/2012  *RADIOLOGY REPORT*  Clinical Data: Left lower lobe infiltrate.  PORTABLE CHEST - 1 VIEW  Comparison:  Portal chest 04/29/2012.  Findings:  The NG tube and left subclavian line are stable.  The heart is mildly enlarged.  There is improved aeration of the left lung with some persistent interstitial or airspace disease.  Lung volumes are low.  Minimal fluid is noted in the minor fissure.  IMPRESSION:  1.  Improving left lower lobe airspace disease. 2.  Support apparatus is stable.   Original Report Authenticated By: Jamesetta Orleans. MATTERN, M.D.    Dg Chest Port 1 View  04/29/2012  *RADIOLOGY REPORT*  Clinical Data: Evaluate endotracheal tube placement.  PORTABLE CHEST - 1 VIEW  Comparison: Chest x-ray 04/28/2012.  Findings: Previously noted  endotracheal tube has been removed. A nasogastric tube is seen extending into the stomach, however, the tip of the nasogastric tube extends below the lower margin of the image. There is a left-sided subclavian central venous catheter with tip terminating in the proximal superior vena cava. Lung volumes remain low.  There continues to be patchy interstitial and airspace disease in the lungs bilaterally, predominately throughout the left lung.  Improving aeration in the right lung.  Probable trace left pleural effusion.  Heart size is borderline enlarged. Mediastinal contours are similar to prior examinations and slightly distorted by patient positioning.  Multiple surgical clips at the level of thoracic inlet, likely from prior thyroid surgery.  IMPRESSION: 1.  Persistent multifocal interstitial and airspace disease throughout the lungs bilaterally (left much greater than right), concerning for multilobar pneumonia.  There has been some improvement in aeration in the right lung compared to the prior examination.   Original Report Authenticated By: Florencia Reasons, M.D.     Assessment/Plan: Diagnosis: deconditioning after respiratory failure, sbo/ileus 1. Does the need for close, 24 hr/day medical supervision in concert with the patient's rehab needs make it unreasonable for this patient to be served in a less intensive setting? Yes 2. Co-Morbidities requiring supervision/potential complications: acute renal failure 3. Due to bladder management, bowel management, safety, skin/wound care, disease management, medication administration, pain management and patient education, does the patient require 24 hr/day rehab nursing? Yes 4. Does the patient require coordinated care of a physician, rehab nurse, PT (1-2 hrs/day, 5 days/week) and OT (1-2 hrs/day, 5 days/week) to address physical and functional deficits in the context of the above medical diagnosis(es)? Yes Addressing deficits in the following areas:  balance, endurance, locomotion, strength, transferring, bowel/bladder control, bathing, dressing, feeding, grooming, toileting and psychosocial support 5. Can the patient actively participate in an intensive therapy program of at least 3 hrs of therapy per day at least 5 days per week? Yes and Potentially 6. The potential for patient to make measurable gains while on inpatient rehab is excellent 7. Anticipated functional outcomes upon discharge from inpatient rehab are mod I to supervision with PT, mod I to minimal assist with OT, n/a with SLP. 8. Estimated rehab length of stay to reach the above functional goals is: 2-3 weeks 9. Does the patient have adequate social supports to accommodate these discharge functional goals? Potentially 10. Anticipated D/C setting: Home 11. Anticipated post D/C treatments: HH therapy 12. Overall Rehab/Functional Prognosis: excellent  RECOMMENDATIONS: This patient's condition is appropriate for continued rehabilitative care in the following setting: eventually CIR Patient has agreed to participate in recommended program. Yes Note that insurance prior authorization may be required for reimbursement for recommended care.  Comment:Rehab RN to follow up.   Ivory Broad, MD     04/30/2012

## 2012-04-30 NOTE — Progress Notes (Signed)
Clinical Social Worker staffed case with [employer].  CSW reviewed chart and met with pt's wife Acquanetta Belling) and step-dtr.  CSW and Intern-Angela introduced self, explained role, and provided support.  Pt and family agreeable to Intern being present during assessment.  CSW reviewed PT recommendations.  Wife agreeable to SNF as back up; pt currently appears asleep.  CSW provided opportunity for wife to address questions/concenrs related to SNF process.  CSW to fax pt out and continue to follow and assist as needed.   Angelia Mould, MSW, [place] [phone]

## 2012-04-30 NOTE — Progress Notes (Signed)
4 Days Post-Op  Subjective: Patient awake, alert; indicates minimal abdominal pain   Objective: Vital signs in last 24 hours: Temp:  [98.1 F (36.7 C)-98.7 F (37.1 C)] 98.6 F (37 C) (08/27 0755) Pulse Rate:  [54-86] 69  (08/27 0600) Resp:  [12-20] 17  (08/27 0600) BP: (131-172)/(67-93) 160/79 mmHg (08/27 0600) SpO2:  [99 %-100 %] 99 % (08/27 0600) Weight:  [185 lb 10 oz (84.2 kg)] 185 lb 10 oz (84.2 kg) (08/27 0200) Last BM Date: 04/22/12  Intake/Output from previous day: 08/26 0701 - 08/27 0700 In: 1717 [NG/GT:90; IV Piggyback:387; TPN:1240] Out: 2365 [Urine:1815; Emesis/NG output:550] Intake/Output this shift:    General appearance: alert, cooperative and no distress GI: distended; hypoactive bowel sounds Incision - clean, dry, intact  Lab Results:   [facility] 04/30/12 0439 04/29/12 0421  WBC 20.3* 13.6*  HGB 10.9* 10.8*  HCT 33.2* 32.1*  PLT 261 209   BMET  Basename 04/30/12 0439 04/29/12 0421  NA 140 140  K 3.9 3.4*  CL 102 100  CO2 30 29  GLUCOSE 205* 184*  BUN 24* 18  CREATININE 1.19 1.39*  CALCIUM 8.0* 7.4*   PT/INR  Basename 04/27/12 1420  LABPROT 15.9*  INR 1.24   ABG  Basename 04/29/12 0325 04/28/12 0358  PHART 7.446 7.389  HCO3 26.7* 20.9    Studies/Results: Dg Chest Port 1 View  04/30/2012  *RADIOLOGY REPORT*  Clinical Data: Left lower lobe infiltrate.  PORTABLE CHEST - 1 VIEW  Comparison:  Portal chest 04/29/2012.  Findings: The NG tube and left subclavian line are stable.  The heart is mildly enlarged.  There is improved aeration of the left lung with some persistent interstitial or airspace disease.  Lung volumes are low.  Minimal fluid is noted in the minor fissure.  IMPRESSION:  1.  Improving left lower lobe airspace disease. 2.  Support apparatus is stable.   Original Report Authenticated By: Jamesetta Orleans. MATTERN, M.D.    Dg Chest Port 1 View  04/29/2012  *RADIOLOGY REPORT*  Clinical Data: Evaluate endotracheal tube placement.   PORTABLE CHEST - 1 VIEW  Comparison: Chest x-ray 04/28/2012.  Findings: Previously noted endotracheal tube has been removed. A nasogastric tube is seen extending into the stomach, however, the tip of the nasogastric tube extends below the lower margin of the image. There is a left-sided subclavian central venous catheter with tip terminating in the proximal superior vena cava. Lung volumes remain low.  There continues to be patchy interstitial and airspace disease in the lungs bilaterally, predominately throughout the left lung.  Improving aeration in the right lung.  Probable trace left pleural effusion.  Heart size is borderline enlarged. Mediastinal contours are similar to prior examinations and slightly distorted by patient positioning.  Multiple surgical clips at the level of thoracic inlet, likely from prior thyroid surgery.  IMPRESSION: 1.  Persistent multifocal interstitial and airspace disease throughout the lungs bilaterally (left much greater than right), concerning for multilobar pneumonia.  There has been some improvement in aeration in the right lung compared to the prior examination.   Original Report Authenticated By: Florencia Reasons, M.D.     Anti-infectives: Anti-infectives     Start     Dose/Rate Route Frequency Ordered Stop   04/29/12 1500   levofloxacin (LEVAQUIN) IVPB 750 mg        750 mg 100 mL/hr over 90 Minutes Intravenous Every 48 hours 04/29/12 1229 05/05/12 1459   04/28/12 1200   vancomycin (VANCOCIN) 750 mg in  sodium chloride 0.9 % 150 mL IVPB  Status:  Discontinued        750 mg 150 mL/hr over 60 Minutes Intravenous Every 12 hours 04/28/12 1149 04/29/12 1226   04/27/12 1600   vancomycin (VANCOCIN) 1,500 mg in sodium chloride 0.9 % 500 mL IVPB        1,500 mg 250 mL/hr over 120 Minutes Intravenous  Once 04/27/12 1414 04/27/12 1821   04/27/12 1500   levofloxacin (LEVAQUIN) IVPB 750 mg  Status:  Discontinued        750 mg 100 mL/hr over 90 Minutes Intravenous Every  48 hours 04/27/12 1414 04/29/12 1229   04/27/12 1445   piperacillin-tazobactam (ZOSYN) IVPB 3.375 g  Status:  Discontinued        3.375 g 12.5 mL/hr over 240 Minutes Intravenous 3 times per day 04/27/12 1414 04/29/12 1226   04/25/12 1605   cefOXitin (MEFOXIN) 2 g in dextrose 5 % 50 mL IVPB  Status:  Discontinued        2 g 100 mL/hr over 30 Minutes Intravenous 60 min pre-op 04/25/12 1605 04/26/12 1039   04/24/12 0930   azithromycin (ZITHROMAX) 500 mg in dextrose 5 % 250 mL IVPB  Status:  Discontinued        500 mg 250 mL/hr over 60 Minutes Intravenous Every 24 hours 04/24/12 0753 04/25/12 1251   04/24/12 0900   cefTRIAXone (ROCEPHIN) 1 g in dextrose 5 % 50 mL IVPB  Status:  Discontinued        1 g 100 mL/hr over 30 Minutes Intravenous Every 24 hours 04/24/12 0753 04/25/12 1252          Assessment/Plan: s/p Procedure(s) (LRB): EXPLORATORY LAPAROTOMY (N/A) INSERTION CENTRAL LINE ADULT (Left) Post-operative ileus after exploratory laparotomy POD #4 - negative for mechanical bowel obstruction On TNA for nutritional support until able to use enteral feeds Wound is healing slowly     LOS: 8 days    Mykia Holton K. 04/30/2012

## 2012-04-30 NOTE — Progress Notes (Signed)
PARENTERAL NUTRITION CONSULT NOTE - FOLLOW UP  Pharmacy Consult for TPN Indication: Adynamic ileus  No Known Allergies  Patient Measurements: Height: 5\' 2"  (157.5 cm) Weight: 185 lb 10 oz (84.2 kg) IBW/kg (Calculated) : 54.6  Adjusted Body Weight: 60 Kg  Vital Signs: Temp: 98.6 F (37 C) (08/27 0755) Temp src: Oral (08/27 0755) BP: 144/72 mmHg (08/27 0700) Pulse Rate: 56  (08/27 0700) Intake/Output from previous day: 08/26 0701 - 08/27 0700 In: 1717 [NG/GT:90; IV Piggyback:387; TPN:1240] Out: 2365 [Urine:1815; Emesis/NG output:550] Intake/Output from this shift:    Labs:  [facility] 04/30/12 0439 04/29/12 0421 04/28/12 0405 04/27/12 1420  WBC 20.3* 13.6* 11.4* --  HGB 10.9* 10.8* 9.8* --  HCT 33.2* 32.1* 29.8* --  PLT 261 209 174 --  APTT -- -- -- 29  INR -- -- -- 1.24     Basename 04/30/12 0439 04/29/12 0421 04/28/12 1825 04/28/12 0405 04/27/12 1420  NA 140 140 -- 140 --  K 3.9 3.4* 3.4* -- --  CL 102 100 -- 110 --  CO2 30 29 -- 23 --  GLUCOSE 205* 184* -- 125* --  BUN 24* 18 -- 13 --  CREATININE 1.19 1.39* -- 1.32 --  LABCREA -- -- -- -- --  CREAT24HRUR -- -- -- -- --  CALCIUM 8.0* 7.4* -- 7.2* --  MG 2.5 1.8 -- 2.0 --  PHOS -- 4.6 -- 2.0* 3.4  PROT 6.6 -- -- 5.1* --  ALBUMIN 2.4* -- -- 1.7* --  AST 56* -- -- 19 --  ALT 56* -- -- 18 --  ALKPHOS 108 -- -- 71 --  BILITOT 0.2* -- -- 0.4 --  BILIDIR -- -- -- -- --  IBILI -- -- -- -- --  PREALBUMIN -- -- -- 6.2* --  TRIG -- -- -- 211* --  CHOLHDL -- -- -- -- --  CHOL -- -- -- 120 --   Estimated Creatinine Clearance: 49.6 ml/min (by C-G formula based on Cr of 1.19).    Basename 04/30/12 0754 04/30/12 0354 04/30/12 0031  GLUCAP 185* 196* 175*    Insulin Requirements in the past 12 hours:  16 units Novolog SSI  Current Nutrition: TPN (CLINIMIX E 5/15) at 60 mL/hr + lipids 23ml/hr on M/W/F NPO  Nutritional Goals (per RD):  1180-1340 kCal, 90-105 grams of protein per day (permissive underfeeding in  obese pt)  Assessment: Patient admitted to [facility] with epigastric pain, chest discomfort and abd distention. s/p exp lap (8/23), findings consistent with adynamic ileus, no obstruction found.  GI: NPO since admit 8/20, noted NGT in place, with ongoing output. No recent BM, pt cont on Reglan- his QTc 430 msec today. Refeeding seems to have resolved. Endo: No prior hx of DM, CBGs 134-196 (continue to be  above goal)-currently on SSI q 4h (ICU protocol). No TSH check this admit, cont on home levothyroxine.  Remains on steroids. Lytes: K seems to have stabilized - still slightly below goal of >4 with ileus Mg at goal for ileus. Corrected Ca is 9.3. Other lytes ok. Goal K ~4, Phos ~3 and Mg ~2 with ileus. Renal: Noted Scr back to nl. UOP 0.9 ml/kg/hr. Pulm: Extubated 8/25 Cards: BP quite variable. HR wnl.  Hepatobil: LFTs WNL at baseline. Alb low at 1.7- reflective of true value s/p hydration. Prealbumin 6.2 - low. Triglycerides higher than goal of 150, will watch closely - should improve off of propofol. Neuro: GCS 15. ID: Afeb, WBC increased to 20.3. Empiric abx for PNA.  Azithromycin 8/21 >>8/22 Ceftriaxone 8/21 >> 8/22 Cefoxitin 8/22 x 1 dose pre-op Vanc 8/24 >>8/26 Zosyn 8/24 >>8/26 Levaquin 8/24 >>  8/22, 24: MRSA PCR neg  8/24: trach asp: NGTD 8/25 trach asp:  NGTD 8/24: Blood x2: NGTD Best Practices: SQ hep, oral care  Central access: CVC (triple lumen)- placed 8/23  Plan:  - Increase Clinimix E 5/15 to goal rate of 75cc/hr. Add lipids at 12ml/hr M/W/F. This will provide an average of 1483 kcal and 90 gm protein per day. - Add  15 units regular insulin to next bag of TPN. - KCl IV x 3 runs today - Will supplement MVI, select trace elements and IV lipids at 10cc/hr on MWF only d/t national shortages - F/up triglycerides closely - next check on Wednesday - Will f/up BMET in a.m.  Thanks, Christoper Fabian, PharmD, BCPS Clinical pharmacist, pager [phone] 04/30/2012 8:37 AM

## 2012-04-30 NOTE — Progress Notes (Signed)
Name: Victor Hunter MRN: [id number] DOB: 1934/01/12    LOS: 8  Referring Provider:  Juanetta Gosling Reason for Referral:  resp failure   PULMONARY / CRITICAL CARE MEDICINE  Brief patient description:    78 year old male admitted to [facility] on 8/19 w/ SBO. Pre-op course complicated by CAP vs aspiration. Went to OR on 8/23, Findings: Adynamic ileus. On 8/24 unable to wean so transferred to [facility] for further eval.   Current Status: Extubated 8/25 No distress  Vital Signs: Temp:  [98.1 F (36.7 C)-98.7 F (37.1 C)] 98.6 F (37 C) (08/27 0755) Pulse Rate:  [54-86] 63  (08/27 0800) Resp:  [13-19] 18  (08/27 0800) BP: (131-172)/(67-93) 157/71 mmHg (08/27 0800) SpO2:  [99 %-100 %] 99 % (08/27 0800) Weight:  [84.2 kg (185 lb 10 oz)] 84.2 kg (185 lb 10 oz) (08/27 0200)  Physical Examination: General:  Obese 78 year old aam. No distress Neuro: cooperative HEENT:  obese Cardiovascular:  rrr Lungs: coarse rt greater left Abdomen:  Distended. Increased BS NGT in place.  Musculoskeletal:  intact Skin:  + dependant edema  GU: foley cath, with clear urine.   Active Problems:  Acute respiratory failure  Respiratory alkalosis  Aspiration pneumonia  Hypokalemia  Acute renal failure  S/P exploratory laparotomy  Adynamic ileus   ASSESSMENT AND PLAN  PULMONARY  Lab 04/29/12 0325 04/28/12 0358 04/27/12 1858 04/27/12 1340 04/27/12 0421  PHART 7.446 7.389 7.670* 7.335* 7.709*  PCO2ART 39.4 34.7* 18.1* 45.4* 17.1*  PO2ART 98.3 86.0 79.0* 113.0* 58.2*  HCO3 26.7* 20.9 20.9 24.2* 22.2  O2SAT 97.7 96.0 98.0 98.0 95.8   Ventilator Settings:   CXR:  Improved int infil left ETT:  8/23>>>8/25  1) Post-op acute respiratory failure 2) Aspiration PNA 3) Respiratory alkalosis Post-op respiratory failure. Likely multifactorial. PNA, pain, hypoxia.  P:   Diurese as able, was neg 700 cc, fluid remains fissure IS Upright , chair position pcxr improved left Avoiding BDer;s did not  tolerate IS  CARDIOVASCULAR  Lab 04/27/12 1420  TROPONINI --  LATICACIDVEN 0.9  PROBNP 132.0   ECG:   Lines: left subclavian CVL 8/23 at ap in OR>>>  1)  Hypotension/ sirs. Septic shock: resolved P:  Plan Off pressors Tele Neg balance remains as goal  RENAL  Lab 04/30/12 0439 04/29/12 0421 04/28/12 0405 04/27/12 1830 04/27/12 1420 04/27/12 0900  NA 140 140 140 137 -- 139  K 3.9 3.4* -- -- -- --  CL 102 100 110 107 -- 107  CO2 30 29 23 22  -- 24  BUN 24* 18 13 13  -- 14  CREATININE 1.19 1.39* 1.32 1.38* 1.63* --  CALCIUM 8.0* 7.4* 7.2* 6.9* -- 6.8*  MG 2.5 1.8 2.0 -- 2.0 --  PHOS -- 4.6 2.0* -- 3.4 --   Intake/Output      08/26 0701 - 08/27 0700 08/27 0701 - 08/28 0700   I.V. (mL/kg)     NG/GT 90    IV Piggyback 387    TPN 1240 70   Total Intake(mL/kg) 1717 (20.4) 70 (0.8)   Urine (mL/kg/hr) 1965 (1)    Emesis/NG output 550    Total Output 2515    Net -798 +70         Foley:    A:  1)  Hypokalemia       2) acute renal failure  Likely scr bump due to hypotension.  P:   redose lasix x 1 Chem in am   GASTROINTESTINAL  Lab  04/30/12 0439 04/28/12 0405 04/26/12 0450  AST 56* 19 21  ALT 56* 18 20  ALKPHOS 108 71 93  BILITOT 0.2* 0.4 0.4  PROT 6.6 5.1* 6.1  ALBUMIN 2.4* 1.7* 2.5*    1) A:  Adynamic ileus 2) s/p exploratory lap 8/23 P: Keep NPO for now, TPN PPI BS increased, diet per surgery Ambulate , PT ordered  HEMATOLOGIC  Lab 04/30/12 0439 04/29/12 0421 04/28/12 0405 04/27/12 1420 04/27/12 0900  HGB 10.9* 10.8* 9.8* 10.8* 10.2*  HCT 33.2* 32.1* 29.8* 33.1* 30.3*  PLT 261 209 174 201 175  INR -- -- -- 1.24 --  APTT -- -- -- 29 --   A:  No acute evidence of bleeding P:  hemoconcetrated Sub q hep lasix  INFECTIOUS  Lab 04/30/12 0439 04/29/12 0421 04/28/12 0405 04/27/12 1420 04/27/12 0900  WBC 20.3* 13.6* 11.4* 14.2* 10.8*  PROCALCITON -- -- -- 0.52 --   Culture data:   Mrsa PCR 8/22: Neg SA surgical screen 8/22: positive Sputum  8/24>>> bcx 2 8/24>>>  Urine strep antigen 8/24>> Urine legionella antigen 8/24>>>  ABX:  Azithro 8/20>>>8/21 Rocephin 8/20>>>8/21 Zosyn 8/24>>>8/26 Vanc 8/24>>>>8/26 Levaquin 8/24>>>   A:  Pneumonia cap vs aspiration, no truie abdo source P: Levo to add stop date total 8 days  ENDOCRINE  Lab 04/30/12 0754 04/30/12 0354 04/30/12 0031 04/29/12 1936 04/29/12 1600  GLUCAP 185* 196* 175* 134* 162*   A: mild hyperglycemia  P: SSI   NEUROLOGIC  A:  1)  Acute encephalopathy       2) pain ileus P:   Supportive care.  Pt, upright Pt on order  Consider transfer sdu 2604 To triad  Mcarthur Rossetti. Tyson Alias, MD, FACP Pgr: [phone] [facility]

## 2012-04-30 NOTE — Progress Notes (Signed)
Patient would benefit from an inpt rehab admission prior to d/c home. I will begin authorization with [facility] medicare. Admission pending their approval and patient's medical readiness to d/c to CIR. Insurance approval is questionable , but I will seek approval. Please call with any questions. [phone]. I will discuss with pt and wife tomorrow.

## 2012-04-30 NOTE — Clinical Social Work Placement (Addendum)
    Clinical Social Work Department CLINICAL SOCIAL WORK PLACEMENT NOTE 04/30/2012  Patient:  Victor Hunter, Victor Hunter  Account Number:  [id number] Admit date:  04/22/2012  Clinical Social Worker:  Margaree Mackintosh  Date/time:  04/30/2012 04:41 PM  Clinical Social Work is seeking post-discharge placement for this patient at the following level of care:   SKILLED NURSING   (*CSW will update this form in Epic as items are completed)   04/30/2012  Patient/family provided with [facility] Department of Clinical Social Work's list of facilities offering this level of care within the geographic area requested by the patient (or if unable, by the patient's family).  04/30/2012  Patient/family informed of their freedom to choose among providers that offer the needed level of care, that participate in Medicare, Medicaid or managed care program needed by the patient, have an available bed and are willing to accept the patient.  04/30/2012  Patient/family informed of [facility]' ownership interest in [facility], as well as of the fact that they are under no obligation to receive care at this facility.  PASARR submitted to EDS on 04/30/2012 PASARR number received from EDS on   FL2 transmitted to all facilities in geographic area requested by pt/family on  04/30/2012 FL2 transmitted to all facilities within larger geographic area on   Patient informed that his/her managed care company has contracts with or will negotiate with  certain facilities, including the following:     Patient/family informed of bed offers received :05/02/12 Patient chooses bed at  Physician recommends and patient chooses bed at    Patient to be transferred to  on   Patient to be transferred to facility by   The following physician request were entered in Epic:   Additional Comments: Pt and family's first choice is CIR.  SNF as backup.

## 2012-05-01 DIAGNOSIS — R778 Other specified abnormalities of plasma proteins: Secondary | ICD-10-CM | POA: Diagnosis present

## 2012-05-01 DIAGNOSIS — E46 Unspecified protein-calorie malnutrition: Secondary | ICD-10-CM | POA: Diagnosis present

## 2012-05-01 DIAGNOSIS — D72829 Elevated white blood cell count, unspecified: Secondary | ICD-10-CM | POA: Diagnosis present

## 2012-05-01 DIAGNOSIS — N390 Urinary tract infection, site not specified: Secondary | ICD-10-CM | POA: Diagnosis present

## 2012-05-01 DIAGNOSIS — J189 Pneumonia, unspecified organism: Secondary | ICD-10-CM | POA: Diagnosis present

## 2012-05-01 DIAGNOSIS — R509 Fever, unspecified: Secondary | ICD-10-CM | POA: Diagnosis present

## 2012-05-01 DIAGNOSIS — E039 Hypothyroidism, unspecified: Secondary | ICD-10-CM | POA: Diagnosis present

## 2012-05-01 DIAGNOSIS — A419 Sepsis, unspecified organism: Secondary | ICD-10-CM | POA: Diagnosis present

## 2012-05-01 LAB — URINE MICROSCOPIC-ADD ON

## 2012-05-01 LAB — GLUCOSE, CAPILLARY
Glucose-Capillary: 154 mg/dL — ABNORMAL HIGH (ref 70–99)
Glucose-Capillary: 176 mg/dL — ABNORMAL HIGH (ref 70–99)

## 2012-05-01 LAB — BASIC METABOLIC PANEL
BUN: 31 mg/dL — ABNORMAL HIGH (ref 6–23)
Calcium: 8.2 mg/dL — ABNORMAL LOW (ref 8.4–10.5)
GFR calc non Af Amer: 59 mL/min — ABNORMAL LOW (ref >90)
Glucose, Bld: 158 mg/dL — ABNORMAL HIGH (ref 70–99)
Sodium: 138 mEq/L (ref 135–145)

## 2012-05-01 LAB — URINALYSIS, ROUTINE W REFLEX MICROSCOPIC
Ketones, ur: NEGATIVE mg/dL
Nitrite: NEGATIVE
Protein, ur: NEGATIVE mg/dL
Urobilinogen, UA: 1 mg/dL (ref 0.0–1.0)
pH: 7 (ref 5.0–8.0)

## 2012-05-01 LAB — HEMOGLOBIN A1C
Hgb A1c MFr Bld: 6.5 % — ABNORMAL HIGH (ref <5.7)
Mean Plasma Glucose: 140 mg/dL — ABNORMAL HIGH (ref <117)

## 2012-05-01 MED ORDER — SODIUM CHLORIDE 0.9 % IJ SOLN
10.0000 mL | Freq: Two times a day (BID) | INTRAMUSCULAR | Status: DC
  Administered 2012-05-01 – 2012-05-06 (×3): 10 mL

## 2012-05-01 MED ORDER — SODIUM CHLORIDE 0.9 % IJ SOLN
10.0000 mL | INTRAMUSCULAR | Status: DC | PRN
  Administered 2012-05-01: 20 mL
  Administered 2012-05-02 – 2012-05-03 (×2): 10 mL

## 2012-05-01 MED ORDER — FAT EMULSION 20 % IV EMUL
240.0000 mL | INTRAVENOUS | Status: AC
  Administered 2012-05-01: 240 mL via INTRAVENOUS
  Filled 2012-05-01: qty 250

## 2012-05-01 MED ORDER — ZINC TRACE METAL 1 MG/ML IV SOLN
INTRAVENOUS | Status: AC
  Administered 2012-05-01: 19:00:00 via INTRAVENOUS
  Filled 2012-05-01: qty 2000

## 2012-05-01 MED ORDER — INSULIN ASPART 100 UNIT/ML ~~LOC~~ SOLN
0.0000 [IU] | SUBCUTANEOUS | Status: DC
  Administered 2012-05-01 – 2012-05-02 (×2): 3 [IU] via SUBCUTANEOUS
  Administered 2012-05-02 (×2): 2 [IU] via SUBCUTANEOUS
  Administered 2012-05-02 – 2012-05-03 (×5): 3 [IU] via SUBCUTANEOUS

## 2012-05-01 NOTE — Progress Notes (Signed)
Orthopedic Tech Progress Note Patient Details:  Victor Hunter May 16, 1934 [id number]  Ortho Devices Type of Ortho Device: Abdominal binder Ortho Device/Splint Interventions: Ordered   Shawnie Pons 05/01/2012, 11:18 AM

## 2012-05-01 NOTE — Progress Notes (Signed)
5 Days Post-Op  Subjective: Patient moved to step-down unit No complaints Reports some flatus overnight Minimal abdominal pain  Objective: Vital signs in last 24 hours: Temp:  [98.3 F (36.8 C)-99 F (37.2 C)] 99 F (37.2 C) (08/28 0400) Pulse Rate:  [53-73] 58  (08/28 0749) Resp:  [12-19] 12  (08/28 0749) BP: (141-179)/(60-85) 157/72 mmHg (08/28 0749) SpO2:  [96 %-100 %] 97 % (08/28 0749) Weight:  [183 lb 13.8 oz (83.4 kg)-184 lb 15.5 oz (83.9 kg)] 184 lb 15.5 oz (83.9 kg) (08/28 0010) Last BM Date:  (pt unsure)  Intake/Output from previous day: 08/27 0701 - 08/28 0700 In: 1847 [NG/GT:90; IV Piggyback:152; TPN:1605] Out: 2125 [Urine:2075; Emesis/NG output:50] Intake/Output this shift:    General appearance: alert, cooperative and no distress Resp: clear to auscultation bilaterally Cardio: regular rate and rhythm, S1, S2 normal, no murmur, click, rub or gallop GI: mildly distended; + bowel sounds; minimal tenderness Incision clean, dry, intact  Lab Results:   [facility] 04/30/12 0439 04/29/12 0421  WBC 20.3* 13.6*  HGB 10.9* 10.8*  HCT 33.2* 32.1*  PLT 261 209   BMET  Basename 05/01/12 0505 04/30/12 0439  NA 138 140  K 4.0 3.9  CL 101 102  CO2 33* 30  GLUCOSE 158* 205*  BUN 31* 24*  CREATININE 1.17 1.19  CALCIUM 8.2* 8.0*   PT/INR No results found for this basename: LABPROT:2,INR:2 in the last 72 hours ABG  Basename 04/29/12 0325  PHART 7.446  HCO3 26.7*    Studies/Results: Dg Chest Port 1 View  04/30/2012  *RADIOLOGY REPORT*  Clinical Data: Left lower lobe infiltrate.  PORTABLE CHEST - 1 VIEW  Comparison:  Portal chest 04/29/2012.  Findings: The NG tube and left subclavian line are stable.  The heart is mildly enlarged.  There is improved aeration of the left lung with some persistent interstitial or airspace disease.  Lung volumes are low.  Minimal fluid is noted in the minor fissure.  IMPRESSION:  1.  Improving left lower lobe airspace disease. 2.   Support apparatus is stable.   Original Report Authenticated By: Jamesetta Orleans. MATTERN, M.D.     Anti-infectives: Anti-infectives     Start     Dose/Rate Route Frequency Ordered Stop   04/29/12 1500   levofloxacin (LEVAQUIN) IVPB 750 mg        750 mg 100 mL/hr over 90 Minutes Intravenous Every 48 hours 04/29/12 1229 05/05/12 1459   04/28/12 1200   vancomycin (VANCOCIN) 750 mg in sodium chloride 0.9 % 150 mL IVPB  Status:  Discontinued        750 mg 150 mL/hr over 60 Minutes Intravenous Every 12 hours 04/28/12 1149 04/29/12 1226   04/27/12 1600   vancomycin (VANCOCIN) 1,500 mg in sodium chloride 0.9 % 500 mL IVPB        1,500 mg 250 mL/hr over 120 Minutes Intravenous  Once 04/27/12 1414 04/27/12 1821   04/27/12 1500   levofloxacin (LEVAQUIN) IVPB 750 mg  Status:  Discontinued        750 mg 100 mL/hr over 90 Minutes Intravenous Every 48 hours 04/27/12 1414 04/29/12 1229   04/27/12 1445   piperacillin-tazobactam (ZOSYN) IVPB 3.375 g  Status:  Discontinued        3.375 g 12.5 mL/hr over 240 Minutes Intravenous 3 times per day 04/27/12 1414 04/29/12 1226   04/25/12 1605   cefOXitin (MEFOXIN) 2 g in dextrose 5 % 50 mL IVPB  Status:  Discontinued  2 g 100 mL/hr over 30 Minutes Intravenous 60 min pre-op 04/25/12 1605 04/26/12 1039   04/24/12 0930   azithromycin (ZITHROMAX) 500 mg in dextrose 5 % 250 mL IVPB  Status:  Discontinued        500 mg 250 mL/hr over 60 Minutes Intravenous Every 24 hours 04/24/12 0753 04/25/12 1251   04/24/12 0900   cefTRIAXone (ROCEPHIN) 1 g in dextrose 5 % 50 mL IVPB  Status:  Discontinued        1 g 100 mL/hr over 30 Minutes Intravenous Every 24 hours 04/24/12 0753 04/25/12 1252          Assessment/Plan: s/p Procedure(s) (LRB): EXPLORATORY LAPAROTOMY (N/A) INSERTION CENTRAL LINE ADULT (Left) Post-op ileus seems to be resolving - will d/c NGT, ice chips Elevated WBC concerning - does not appear to coming from his abdomen  LOS: 9 days     Perkins Molina K. 05/01/2012

## 2012-05-01 NOTE — Progress Notes (Signed)
I met with patient at bedside. Explained that I question if [facility] Medicare will approve an inpt rehab admission. I will pursue their approval today, but recommend SNF rehab search also. Patient aware. He is married for 12 years, but they do not live together. Patient states he makes his own decisions now but his daughter, Inetta Fermo, is his POA. I will discuss with SW. I will follow up after decision from insurance carrier. [phone]

## 2012-05-01 NOTE — Progress Notes (Signed)
Physical Therapy Treatment Patient Details Name: Victor Hunter MRN: [id number] DOB: May 27, 1934 Today's Date: 05/01/2012 Time: 1308-6578 PT Time Calculation (min): 35 min  PT Assessment / Plan / Recommendation Comments on Treatment Session  Pt improved greatly with mobility today, ambulating 61' with min A and RW. PT will continue to follow.    Follow Up Recommendations  Inpatient Rehab;Other (comment) (SNF maybe only option per rehab coordinator)    Barriers to Discharge        Equipment Recommendations  3 in 1 bedside comode;Rolling walker with 5" wheels    Recommendations for Other Services    Frequency Min 3X/week   Plan Discharge plan remains appropriate;Frequency remains appropriate    Precautions / Restrictions Precautions Precautions: Fall Required Braces or Orthoses: Other Brace/Splint Other Brace/Splint: abdominal binder arrived once pt already ambulating, placed on pt before getting settled into chair Restrictions Weight Bearing Restrictions: No   Pertinent Vitals/Pain No c/o pain VSS    Mobility  Bed Mobility Bed Mobility: Rolling Left;Left Sidelying to Sit;Sitting - Scoot to [place] of Bed Rolling Left: 4: Min assist;With rail Left Sidelying to Sit: 3: Mod assist;With rails Sitting - Scoot to Edge of Bed: 5: Supervision Details for Bed Mobility Assistance: verbal cues for sequencing Transfers Transfers: Sit to Stand;Stand to Sit Sit to Stand: 1: +2 Total assist;From bed;From chair/3-in-1 Sit to Stand: Patient Percentage: 50% Stand to Sit: 1: +2 Total assist;With upper extremity assist;With armrests;To chair/3-in-1 Stand to Sit: Patient Percentage: 60% Details for Transfer Assistance: verbal cues for hand placement, vc's to push through legs, straighten knees, and lift chest.  Pt tends to stand with fwd flexed posture and additionally has the abdominal incision now Ambulation/Gait Ambulation/Gait Assistance: 4: Min assist Ambulation Distance (Feet): 80  Feet Assistive device: Rolling walker Ambulation/Gait Assistance Details: frequent vc's for upright posture Gait Pattern: Step-through pattern;Trunk flexed Gait velocity: decreased Stairs: No Wheelchair Mobility Wheelchair Mobility: No     PT Goals Acute Rehab PT Goals PT Goal Formulation: With patient Time For Goal Achievement: 05/13/12 Potential to Achieve Goals: Good Pt will Roll Supine to Right Side: with modified independence PT Goal: Rolling Supine to Right Side - Progress: Progressing toward goal Pt will Roll Supine to Left Side: with modified independence PT Goal: Rolling Supine to Left Side - Progress: Progressing toward goal Pt will go Supine/Side to Sit: with modified independence PT Goal: Supine/Side to Sit - Progress: Progressing toward goal Pt will go Sit to Supine/Side: with modified independence PT Goal: Sit to Supine/Side - Progress: Progressing toward goal Pt will go Sit to Stand: with modified independence PT Goal: Sit to Stand - Progress: Progressing toward goal Pt will go Stand to Sit: with modified independence PT Goal: Stand to Sit - Progress: Progressing toward goal Pt will Ambulate: >150 feet;with modified independence;with rolling walker PT Goal: Ambulate - Progress: Progressing toward goal Pt will Go Up / Down Stairs: 3-5 stairs;with supervision;with rail(s) PT Goal: Up/Down Stairs - Progress: Discontinued (comment) (pt not going straight home)  Visit Information  Last PT Received On: 05/01/12 Assistance Needed: +2 (+2 for lines) PT/OT Co-Evaluation/Treatment: Yes    Subjective Data  Subjective: pt ready to go home he says Patient Stated Goal: return home   Cognition  Overall Cognitive Status: Appears within functional limits for tasks assessed/performed Arousal/Alertness: Awake/alert Orientation Level: Appears intact for tasks assessed Behavior During Session: [facility] for tasks performed    Balance  Balance Balance Assessed: Yes Static Sitting  Balance Static Sitting - Balance Support:  Feet supported Static Sitting - Level of Assistance: 5: Stand by assistance  End of Session PT - End of Session Equipment Utilized During Treatment: Gait belt Activity Tolerance: Patient tolerated treatment well Patient left: in chair;with call bell/phone within reach Nurse Communication: Mobility status   GP   Lyanne Co, PT  Acute Rehab Services  [phone]   Lyanne Co 05/01/2012, 12:09 PM

## 2012-05-01 NOTE — Progress Notes (Signed)
Nutrition Follow-up  Intervention:   1. RD to follow TPN with Pharmacy to meet nutrition goals as able during national lipid shortage 2. Recommend initiation of trophic enteral feedings vs PO diet once able, per surgery  Assessment:   Extubated on 8/25. CIR has evaluated patient. Pt transferred to step-down unit. Per surgery, post-op ileus seems to be resolving - NGT d/c. Ice chips started.  Sodium, potassium and phosphorus WNL. Noted prealbumin 6.2 (low.)  Patient is receiving TPN with Clinimix E 5/15 @ 75 ml/hr. Lipids (20% IVFE @ 10 ml/hr), multivitamins, and trace elements are provided 3 times weekly (MWF) due to national backorder. Will provide 1483 kcal and 90 grams protein daily (based on weekly average). Meets 93% minimum estimated kcal and 100% minimum estimated protein needs.  Diet Order:  NPO  Meds: Scheduled Meds:   . antiseptic oral rinse  15 mL Mouth Rinse QID  . bisacodyl  10 mg Rectal Once  . chlorhexidine  15 mL Mouth Rinse BID  . furosemide  20 mg Intravenous Once  . heparin  5,000 Units Subcutaneous Q8H  . insulin aspart  0-4 Units Subcutaneous Q4H  . levofloxacin (LEVAQUIN) IV  750 mg Intravenous Q48H  . levothyroxine  62.5 mcg Intravenous Daily  . methylPREDNISolone (SOLU-MEDROL) injection  20 mg Intravenous Q12H  . metoCLOPramide (REGLAN) injection  5 mg Intravenous Q6H  . mupirocin ointment  1 application Nasal BID  . pantoprazole (PROTONIX) IV  40 mg Intravenous QAC breakfast  . potassium chloride  10 mEq Intravenous Q1 Hr x 3  . DISCONTD: methylPREDNISolone (SOLU-MEDROL) injection  40 mg Intravenous Q8H   Continuous Infusions:   . fat emulsion 240 mL (04/29/12 1757)  . fat emulsion    . TPN (CLINIMIX) +/- additives 60 mL/hr at 04/29/12 1757  . TPN (CLINIMIX) +/- additives 75 mL/hr at 04/30/12 1757  . TPN (CLINIMIX) +/- additives    . DISCONTD: norepinephrine (LEVOPHED) Adult infusion Stopped (04/27/12 1945)  . DISCONTD: propofol 60.049 mcg/kg/min  (04/28/12 0900)   PRN Meds:.sodium chloride, fentaNYL, ondansetron  Labs:  CMP     Component Value Date/Time   NA 138 05/01/2012 0505   K 4.0 05/01/2012 0505   CL 101 05/01/2012 0505   CO2 33* 05/01/2012 0505   GLUCOSE 158* 05/01/2012 0505   BUN 31* 05/01/2012 0505   CREATININE 1.17 05/01/2012 0505   CALCIUM 8.2* 05/01/2012 0505   PROT 6.6 04/30/2012 0439   ALBUMIN 2.4* 04/30/2012 0439   AST 56* 04/30/2012 0439   ALT 56* 04/30/2012 0439   ALKPHOS 108 04/30/2012 0439   BILITOT 0.2* 04/30/2012 0439   GFRNONAA 59* 05/01/2012 0505   GFRAA 68* 05/01/2012 0505   Sodium  Date/Time Value Range Status  05/01/2012  5:05 AM 138  135 - 145 mEq/L Final  04/30/2012  4:39 AM 140  135 - 145 mEq/L Final  04/29/2012  4:21 AM 140  135 - 145 mEq/L Final    Potassium  Date/Time Value Range Status  05/01/2012  5:05 AM 4.0  3.5 - 5.1 mEq/L Final  04/30/2012  4:39 AM 3.9  3.5 - 5.1 mEq/L Final  04/29/2012  4:21 AM 3.4* 3.5 - 5.1 mEq/L Final    Phosphorus  Date/Time Value Range Status  04/29/2012  4:21 AM 4.6  2.3 - 4.6 mg/dL Final  1/61/0960  4:54 AM 2.0* 2.3 - 4.6 mg/dL Final  0/98/1191  4:78 PM 3.4  2.3 - 4.6 mg/dL Final    Magnesium  Date/Time Value Range Status  04/30/2012  4:39 AM 2.5  1.5 - 2.5 mg/dL Final  10/18/863  7:84 AM 1.8  1.5 - 2.5 mg/dL Final  6/96/2952  8:41 AM 2.0  1.5 - 2.5 mg/dL Final   Prealbumin  Date/Time Value Range Status  04/28/2012  4:05 AM 6.2* 17.0 - 34.0 mg/dL Final   Lipid Panel     Component Value Date/Time   CHOL 120 04/28/2012 0405   TRIG 211* 04/28/2012 0405    Intake/Output Summary (Last 24 hours) at 05/01/12 0858 Last data filed at 05/01/12 0700  Gross per 24 hour  Intake   1777 ml  Output   2125 ml  Net   -348 ml   Weight Status:  83.9 kg/184 lb - stable  Body mass index is 33.83 kg/(m^2). Obese Class I.  Re-estimated needs:  1600 - 1800 kcals, 90-105 grams protein, 1.6-1.8 liters fluid  Nutrition Dx:  Inadequate oral intake related to inability to eat as  evidenced by NPO status, ongoing.  Previous Goal:  Parenteral nutrition to provide 60-70% of estimated calorie needs (22-25 kcals/kg ideal body weight) and >/= 90% of estimated protein needs, based on ASPEN guidelines for permissive underfeeding in critically ill obese individuals. Discontinue this goal.  New Goal: Pt to meet >/= 90% of their estimated nutrition needs with parenteral nutrition  Monitor:  TPN tolerance, labs, weight trend, ability to transition to enteral nutrition  Jarold Motto MS, RD, LDN Pager: [phone] After-hours pager: [phone]

## 2012-05-01 NOTE — Progress Notes (Signed)
PARENTERAL NUTRITION/ANTIBIOTIC CONSULT NOTE - FOLLOW UP  Pharmacy Consult for TPN and Levaquin Indication: Adynamic ileus and PNA  No Known Allergies  Patient Measurements: Height: 5\' 2"  (157.5 cm) Weight: 184 lb 15.5 oz (83.9 kg) IBW/kg (Calculated) : 54.6  Adjusted Body Weight: 60 Kg  Vital Signs: Temp: 99 F (37.2 C) (08/28 0400) Temp src: Oral (08/28 0400) BP: 157/72 mmHg (08/28 0749) Pulse Rate: 58  (08/28 0749) Intake/Output from previous day: 08/27 0701 - 08/28 0700 In: 1847 [NG/GT:90; IV Piggyback:152; TPN:1605] Out: 2125 [Urine:2075; Emesis/NG output:50] Intake/Output from this shift:    Labs:  [facility] 04/30/12 0439 04/29/12 0421  WBC 20.3* 13.6*  HGB 10.9* 10.8*  HCT 33.2* 32.1*  PLT 261 209  APTT -- --  INR -- --     Basename 05/01/12 0505 04/30/12 0439 04/29/12 0421  NA 138 140 140  K 4.0 3.9 3.4*  CL 101 102 100  CO2 33* 30 29  GLUCOSE 158* 205* 184*  BUN 31* 24* 18  CREATININE 1.17 1.19 1.39*  LABCREA -- -- --  CREAT24HRUR -- -- --  CALCIUM 8.2* 8.0* 7.4*  MG -- 2.5 1.8  PHOS -- -- 4.6  PROT -- 6.6 --  ALBUMIN -- 2.4* --  AST -- 56* --  ALT -- 56* --  ALKPHOS -- 108 --  BILITOT -- 0.2* --  BILIDIR -- -- --  IBILI -- -- --  PREALBUMIN -- -- --  TRIG -- -- --  CHOLHDL -- -- --  CHOL -- -- --   Estimated Creatinine Clearance: 50.4 ml/min (by C-G formula based on Cr of 1.17).    Basename 05/01/12 0453 05/01/12 0012 04/30/12 2030  GLUCAP 161* 149* 170*    Insulin Requirements in the past 12 hours:  7 units Novolog SSI + Regular insulin 15 units in TPN  Current Nutrition: TPN (CLINIMIX E 5/15) at 75 mL/hr + lipids 69ml/hr on M/W/F NPO  Nutritional Goals (per RD):  1180-1340 kCal, 90-105 grams of protein per day (permissive underfeeding in obese pt)  Assessment: Patient admitted to [facility] with epigastric pain, chest discomfort and abd distention. s/p exp lap (8/23), findings consistent with adynamic ileus, no obstruction found.    ZO:[id number] ileus seems to be resolving per CCS. NPO since admit 8/20, noted NGT o/p decreased - plan to d/c today. No recent BM but +flatus, pt cont on Reglan- his QTc=370 msec today. Endo: No prior hx of DM, CBGs 149-170 (better with insulin added to TPN. No TSH check this admit, cont on home levothyroxine.  Tapering steroids. Lytes: K seems to have stabilized and is at goal. Mg at goal for ileus. Corrected Ca is 9.5. Other lytes ok. Goal K ~4, Phos ~3 and Mg ~2 with ileus. Renal: Noted Scr back to nl. UOP 1.1 ml/kg/hr. Cards: BP high. HR wnl.  Hepatobil: LFTs WNL at baseline. Prealbumin 6.2 - low. Triglycerides higher than goal of 150, will watch closely. ID: Afeb, WBC increased to 20.3. Levaquin Day #5/8 for PNA. Azithromycin 8/21 >>8/22 Ceftriaxone 8/21 >> 8/22 Cefoxitin 8/22 x 1 dose pre-op Vanc 8/24 >>8/26 Zosyn 8/24 >>8/26 Levaquin 8/24 >>stops 9/1  8/22, 24: MRSA PCR neg  8/24: trach asp: Neg 8/25 trach asp:  Neg 8/24: Blood x2: NGTD Best Practices: SQ hep, oral care  Central access: CVC (triple lumen)- placed 8/23  Plan:  - Continue Clinimix E 5/15 to goal rate of 75cc/hr +  lipids at 60ml/hr M/W/F. This will provide an average of 1483 kcal and  90 gm protein per day. - Will supplement MVI, select trace elements and IV lipids at 10cc/hr on MWF only d/t national shortages - F/u TPN labs in a.m. - F/u ability to start po diet and wean/d/c TPN - Continue levaquin 750mg  IV q48 - noted stop date of 9/1  Thanks, Christoper Fabian, PharmD, BCPS Clinical pharmacist, pager [phone] 05/01/2012 8:21 AM

## 2012-05-01 NOTE — Evaluation (Signed)
Occupational Therapy Evaluation Patient Details Name: Victor Hunter MRN: [id number] DOB: 25-Jan-1934 Today's Date: 05/01/2012 Time: [id number] OT Time Calculation (min): 36 min  OT Assessment / Plan / Recommendation Clinical Impression  Pt is a 78 y.o. male with history of HTN, anemia, who was originally admitted to [facility] 04/22/12 with epigastric and substernal pain due to SBO. Hospital course complicated by  PNA, ileus.  Had tachypnea and difficult vent wean therefore transferred to [facility] on 08/24.  Pt will benefit from OT to address the below areas identified to facilitate eventual return home with intermittent assist of family.     OT Assessment  Patient needs continued OT Services    Follow Up Recommendations  Inpatient Rehab    Barriers to Discharge Decreased caregiver support    Equipment Recommendations  3 in 1 bedside comode;Rolling walker with 5" wheels    Recommendations for Other Services Rehab consult  Frequency  Min 2X/week    Precautions / Restrictions Precautions Precautions: Fall Restrictions Weight Bearing Restrictions: No   Pertinent Vitals/Pain Soreness in abdomen when bending to reach feet.    ADL  Eating/Feeding: Performed;Set up;Other (comment) (ice chips with spoon) Where Assessed - Eating/Feeding: Chair Grooming: Performed;Wash/dry hands;Wash/dry face;Set up Where Assessed - Grooming: Supported sitting Upper Body Bathing: Simulated;Minimal assistance Where Assessed - Upper Body Bathing: Unsupported sitting Lower Body Bathing: Simulated;Maximal assistance Where Assessed - Lower Body Bathing: Supported sit to stand Upper Body Dressing: Performed;Minimal assistance Where Assessed - Upper Body Dressing: Unsupported sitting Lower Body Dressing: Performed;+1 Total assistance (socks) Where Assessed - Lower Body Dressing: Unsupported sitting Equipment Used: Gait belt;Rolling walker Transfers/Ambulation Related to ADLs: min assist to ambulate with RW, chair  following via second person ADL Comments: Pt on bedpan upon OTs arrival.  Total assist for pericare after bowel movement. ADL independence limited by decreased endurance and difficulty reaching feet.  Abdominal binder placed, further limiting access to feet for LB bathing and dressing.    OT Diagnosis: Generalized weakness  OT Problem List: Decreased activity tolerance;Decreased strength;Impaired balance (sitting and/or standing);Obesity;Decreased knowledge of use of DME or AE OT Treatment Interventions: Self-care/ADL training;DME and/or AE instruction;Energy conservation;Therapeutic activities;Patient/family education   OT Goals Acute Rehab OT Goals OT Goal Formulation: With patient Time For Goal Achievement: 05/15/12 Potential to Achieve Goals: Good ADL Goals Pt Will Perform Grooming: with set-up;Sitting, edge of bed ADL Goal: Grooming - Progress: Goal set today Pt Will Perform Upper Body Bathing: with set-up;Sitting, edge of bed ADL Goal: Upper Body Bathing - Progress: Goal set today Pt Will Perform Lower Body Bathing: with min assist;Sit to stand from bed;with adaptive equipment ADL Goal: Lower Body Bathing - Progress: Goal set today Pt Will Perform Upper Body Dressing: with set-up;Sitting, bed ADL Goal: Upper Body Dressing - Progress: Goal set today Pt Will Perform Lower Body Dressing: with min assist;with adaptive equipment;Sit to stand from bed ADL Goal: Lower Body Dressing - Progress: Goal set today Pt Will Transfer to Toilet: with supervision;Ambulation;with DME;3-in-1 ADL Goal: Toilet Transfer - Progress: Goal set today Pt Will Perform Toileting - Clothing Manipulation: with supervision;Standing ADL Goal: Toileting - Clothing Manipulation - Progress: Goal set today Pt Will Perform Toileting - Hygiene: with modified independence;Sit to stand from 3-in-1/toilet ADL Goal: Toileting - Hygiene - Progress: Goal set today Miscellaneous OT Goals Miscellaneous OT Goal #1: Pt will  employ energy conservation strategies in ADL without cues. OT Goal: Miscellaneous Goal #1 - Progress: Goal set today  Visit Information  Last OT Received On: 05/01/12 Assistance  Needed: +2 PT/OT Co-Evaluation/Treatment: Yes    Subjective Data  Subjective: "It feels good to be up." Patient Stated Goal: Return to prior functioning level.   Prior Functioning  Vision/Perception  Home Living Lives With: Other (Comment) (grandson) Available Help at Discharge: Family;Available PRN/intermittently Type of Home: House Home Access: Stairs to enter [employer] of Steps: 2-3 Entrance Stairs-Rails: None Home Layout: One level Bathroom Shower/Tub: Network engineer: None Prior Function Level of Independence: Needs assistance Needs Assistance: Dressing;Meal Prep;Light Housekeeping Dressing: Minimal Meal Prep: Minimal Light Housekeeping: Minimal Able to Take Stairs?: Yes Driving: Yes Vocation: Retired Musician: No difficulties Dominant Hand: Right      Cognition  Overall Cognitive Status: Appears within functional limits for tasks assessed/performed Arousal/Alertness: Awake/alert Orientation Level: Disoriented to;Time Behavior During Session: [facility] for tasks performed    Extremity/Trunk Assessment Right Upper Extremity Assessment RUE ROM/Strength/Tone: [facility] for tasks assessed Left Upper Extremity Assessment LUE ROM/Strength/Tone: WFL for tasks assessed   Mobility  Shoulder Instructions  Bed Mobility Bed Mobility: Rolling Left;Left Sidelying to Sit;Sitting - Scoot to [place] of Bed Rolling Left: 4: Min assist;With rail Left Sidelying to Sit: 3: Mod assist Sitting - Scoot to Edge of Bed: 5: Supervision Details for Bed Mobility Assistance: verbal cues for sequencing Transfers Transfers: Sit to Stand;Stand to Sit Sit to Stand: 1: +2 Total assist;With upper extremity assist;From bed Sit to Stand: Patient  Percentage: 50% Stand to Sit: 1: +2 Total assist;With upper extremity assist;To chair/3-in-1;With armrests Stand to Sit: Patient Percentage: 60% Details for Transfer Assistance: verbal cues for hand placement       Exercise     Balance Balance Balance Assessed: Yes Static Sitting Balance Static Sitting - Balance Support: Feet supported Static Sitting - Level of Assistance: 5: Stand by assistance   End of Session OT - End of Session Activity Tolerance: Patient tolerated treatment well Patient left: in chair;with call bell/phone within reach Nurse Communication: Mobility status;Other (comment) (may have ice chips, abdominal binder placed, pt with BM)  GO     Evern Bio 05/01/2012, 11:14 AM [phone]

## 2012-05-01 NOTE — Progress Notes (Signed)
[facility] Progress Note [place] TEAM 1 - Stepdown/ICU TEAM   Victor Hunter ZOX:[id number] DOB: Aug 17, 1934 DOA: 04/22/2012 PCP: Avon Gully, MD  Brief narrative: 78 year old male patient initially admitted [facility] on 04/22/2012 with abdominal distention felt to be related to small bowel obstruction. He was subsequently taken to the operating room where he underwent exploratory laparotomy but no abnormal abdominal pathology found except for adynamic ileus. Preoperatively patient had been previously diagnosed with community acquired pneumonia versus aspiration pneumonia. On 04/27/2012 they were unable to wean him from the ventilator and therefore he was transferred to [facility] for further evaluation. He was accepted by pulmonary critical care medicine. He was subsequently extubated on 04/28/2012. Surgery has been following the patient since arrival. He is currently on parenteral nutrition and his NG tube has recently been discontinued and he is tolerating ice chips without difficulty. He was deemed appropriate to transfer to the step down unit on 04/30/2012. [facility] team 1 accepted care of this patient on 05/01/2012.  Assessment/Plan:  Post-op Acute respiratory failure due to:  a) Respiratory alkalosis  b) Aspiration pneumonia  c) CAP (community acquired pneumonia) *Has been doing well since extubation *Suspect initial respiratory failure multifactorial due to pneumonia, pain, and hypoxemia *There may have been a degree of volume overload-patient currently in a negative balance of greater than 700 cc *Currently on Levaquin to cover atypicals as well as community-acquired pneumonia *Continue supportive care as well as pulmonary toileting and mobilization  Shock/? Sepsis associated hypotension *Resolved *Stress dose steroids have been weaned to 20 mg IV every 12 hours so we'll discontinue today  S/P exploratory laparotomy/ Adynamic ileus *Management per surgical  team  Acute renal failure *Improving *Creatinine has stabilized - peak creatinine 1.63 with baseline creatinine 0.93 *Still has mildly elevated BUN that could be related to recent steroids *May actually need some volume-suspect initial etiology prerenal from presentation with small bowel obstruction symptoms complicated by hypotension  Leukocytosis/Fever/UTI (lower urinary tract infection) *White cell count has been trending upward and has been associated with a low-grade fever. Leukocytosis could primarily be related to steroids  *Patient had a urinalysis on 04/27/2012 but did not have urine culture. That urinalysis appeared to be consistent with a urinary tract infection  *We'll go ahead and repeat urinalysis and culture to help clarify  *Patient currently on Levaquin since 04/27/2012   Hypokalemia *Resolved   Protein calorie malnutrition *Currently on parenteral nutrition with pharmacy dosing  *Per surgical team   Transaminitis *Likely secondary to parenteral nutrition   Hypothyroidism *Continue IV Synthroid - convert to oral when tolerating diet   DVT prophylaxis: subcutaneous heparin  Code Status:  full Family Communication:  spoke with  patient - no family available at bedside Disposition Plan:  transfer to surgical floor  Consultants: Dr. Effie Shy  Procedures: exploratory laparotomy and central line placement on 04/26/2012 by Dr. Leticia Penna  Culture data:  Mrsa PCR 8/22: Neg  SA surgical screen 8/22: positive  Sputum 8/24>>>  bcx 2 8/24>>>  Urine strep antigen 8/24>>  Urine legionella antigen 8/24>>>  Antibiotics: Azithro 8/20>>>8/21  Rocephin 8/20>>>8/21  Zosyn 8/24>>>8/26  Vanc 8/24>>>>8/26  Levaquin 8/24>>>   HPI/Subjective: Patient sitting up in chair. Is alert. Passed a bowel movement today. Denies abdominal pain. Tolerating ice chips for the first time today.   Objective: Blood pressure 155/72, pulse 61, temperature 99.5 F (37.5 C),  temperature source Oral, resp. rate 18, height 5\' 2"  (1.575 m), weight 83.9 kg (184 lb 15.5 oz), SpO2 97.00%.  Intake/Output Summary (Last 24 hours) at 05/01/12 1343 Last data filed at 05/01/12 1321  Gross per 24 hour  Intake   1415 ml  Output   1375 ml  Net     40 ml     Exam: General: No acute respiratory distress Lungs: Clear to auscultation bilaterally without wheezes or crackles, room air  Cardiovascular: Regular rate and rhythm without murmur gallop or rub normal S1 and S2 Abdomen: Nontender, somewhat distended, soft, bowel sounds positive hyperactive , no rebound, no ascites, no appreciable mass, abdominal binder intact Musculoskeletal: No significant cyanosis, clubbing of extremities Neurological: Patient is alert and awake without any obvious focal neurological deficits  Data Reviewed: Basic Metabolic Panel:  Lab 05/01/12 1610 04/30/12 0439 04/29/12 0421 04/28/12 1825 04/28/12 0405 04/27/12 1830 04/27/12 1420  NA 138 140 140 -- 140 137 --  K 4.0 3.9 3.4* 3.4* 2.9* -- --  CL 101 102 100 -- 110 107 --  CO2 33* 30 29 -- 23 22 --  GLUCOSE 158* 205* 184* -- 125* 121* --  BUN 31* 24* 18 -- 13 13 --  CREATININE 1.17 1.19 1.39* -- 1.32 1.38* --  CALCIUM 8.2* 8.0* 7.4* -- 7.2* 6.9* --  MG -- 2.5 1.8 -- 2.0 -- 2.0  PHOS -- -- 4.6 -- 2.0* -- 3.4   Liver Function Tests:  Lab 04/30/12 0439 04/28/12 0405 04/26/12 0450  AST 56* 19 21  ALT 56* 18 20  ALKPHOS 108 71 93  BILITOT 0.2* 0.4 0.4  PROT 6.6 5.1* 6.1  ALBUMIN 2.4* 1.7* 2.5*    Lab 04/27/12 1420  LIPASE 20  AMYLASE 41   CBC:  Lab 04/30/12 0439 04/29/12 0421 04/28/12 0405 04/27/12 1420 04/27/12 0900 04/26/12 0450  WBC 20.3* 13.6* 11.4* 14.2* 10.8* --  NEUTROABS 17.5* -- 8.5* -- -- 7.3  HGB 10.9* 10.8* 9.8* 10.8* 10.2* --  HCT 33.2* 32.1* 29.8* 33.1* 30.3* --  MCV 78.3 77.2* 77.6* 78.6 77.9* --  PLT 261 209 174 201 175 --   BNP (last 3 results)  Basename 04/27/12 1420  PROBNP 132.0   CBG:  Lab 05/01/12  1201 05/01/12 0752 05/01/12 0453 05/01/12 0012 04/30/12 2030  GLUCAP 152* 154* 161* 149* 170*    Recent Results (from the past 240 hour(s))  SURGICAL PCR SCREEN     Status: Abnormal   Collection Time   04/25/12  7:59 PM      Component Value Range Status Comment   MRSA, PCR NEGATIVE  NEGATIVE Final    Staphylococcus aureus POSITIVE (*) NEGATIVE Final   CULTURE, BLOOD (ROUTINE X 2)     Status: Normal (Preliminary result)   Collection Time   04/27/12  2:50 PM      Component Value Range Status Comment   Specimen Description BLOOD RIGHT ARM   Final    Special Requests BOTTLES DRAWN AEROBIC AND ANAEROBIC 10CC   Final    Culture  Setup Time 04/27/2012 20:04   Final    Culture     Final    Value:        BLOOD CULTURE RECEIVED NO GROWTH TO DATE CULTURE WILL BE HELD FOR 5 DAYS BEFORE ISSUING A FINAL NEGATIVE REPORT   Report Status PENDING   Incomplete   CULTURE, BLOOD (ROUTINE X 2)     Status: Normal (Preliminary result)   Collection Time   04/27/12  2:52 PM      Component Value Range Status Comment   Specimen Description BLOOD LEFT ARM  Final    Special Requests BOTTLES DRAWN AEROBIC AND ANAEROBIC 10CC   Final    Culture  Setup Time 04/27/2012 20:04   Final    Culture     Final    Value:        BLOOD CULTURE RECEIVED NO GROWTH TO DATE CULTURE WILL BE HELD FOR 5 DAYS BEFORE ISSUING A FINAL NEGATIVE REPORT   Report Status PENDING   Incomplete   MRSA PCR SCREENING     Status: Normal   Collection Time   04/27/12  3:07 PM      Component Value Range Status Comment   MRSA by PCR NEGATIVE  NEGATIVE Final   CULTURE, RESPIRATORY     Status: Normal   Collection Time   04/27/12  3:44 PM      Component Value Range Status Comment   Specimen Description TRACHEAL ASPIRATE   Final    Special Requests NONE   Final    Gram Stain     Final    Value: MODERATE WBC PRESENT, PREDOMINANTLY PMN     NO SQUAMOUS EPITHELIAL CELLS SEEN     NO ORGANISMS SEEN   Culture NO GROWTH 2 DAYS   Final    Report Status  04/30/2012 FINAL   Final   CULTURE, RESPIRATORY     Status: Normal   Collection Time   04/28/12  5:08 AM      Component Value Range Status Comment   Specimen Description TRACHEAL ASPIRATE   Final    Special Requests NONE   Final    Gram Stain     Final    Value: ABUNDANT WBC PRESENT, PREDOMINANTLY PMN     RARE SQUAMOUS EPITHELIAL CELLS PRESENT     NO ORGANISMS SEEN   Culture NO GROWTH 2 DAYS   Final    Report Status 04/30/2012 FINAL   Final     Studies:  Recent x-ray studies have been reviewed in detail by the Attending Physician  Scheduled Meds:  Reviewed in detail by the Attending Physician   Junious Silk, ANP [facility] Office  [phone] Pager [phone]  On-Call/Text Page:      [email or web address]      password [email or web address]  If 7PM-7AM, please contact night-coverage [email or web address] Password [email or web address] 05/01/2012, 1:43 PM   LOS: 9 days   I have personally examined this patient and reviewed the entire database. I have reviewed the above note, made any necessary editorial changes, and agree with its content.  Lonia Blood, MD [facility]

## 2012-05-02 DIAGNOSIS — E46 Unspecified protein-calorie malnutrition: Secondary | ICD-10-CM

## 2012-05-02 LAB — COMPREHENSIVE METABOLIC PANEL
ALT: 93 U/L — ABNORMAL HIGH (ref 0–53)
AST: 51 U/L — ABNORMAL HIGH (ref 0–37)
Calcium: 8.2 mg/dL — ABNORMAL LOW (ref 8.4–10.5)
GFR calc Af Amer: 70 mL/min — ABNORMAL LOW (ref >90)
Sodium: 138 mEq/L (ref 135–145)
Total Protein: 6.7 g/dL (ref 6.0–8.3)

## 2012-05-02 LAB — GLUCOSE, CAPILLARY: Glucose-Capillary: 143 mg/dL — ABNORMAL HIGH (ref 70–99)

## 2012-05-02 MED ORDER — POTASSIUM CHLORIDE 10 MEQ/50ML IV SOLN
10.0000 meq | INTRAVENOUS | Status: AC
  Administered 2012-05-02 (×4): 10 meq via INTRAVENOUS
  Filled 2012-05-02 (×4): qty 50

## 2012-05-02 MED ORDER — INSULIN REGULAR HUMAN 100 UNIT/ML IJ SOLN
INTRAVENOUS | Status: DC
  Administered 2012-05-02: 18:00:00 via INTRAVENOUS
  Filled 2012-05-02: qty 2000

## 2012-05-02 NOTE — Progress Notes (Signed)
Clinical Social Work  CSW met with patient and brother in order to give bed offers. Patient reports that he is hopeful for CIR but is agreeable to SNF as alternative option. Patient reports that he will review list before making any final decisions. CSW will continue to follow.  [place], [place] [phone]

## 2012-05-02 NOTE — Progress Notes (Signed)
Clinical Social Work  [employer] will not approve CIR but will approve SNF. CSW explained this information to patient. Patient agreeable to SNF. Patient requested search in [place] to be closer to family. CSW expanded search. CSW will follow up with bed offers.  [place], [place] [phone]

## 2012-05-02 NOTE — Progress Notes (Signed)
Patient ID: Victor Hunter, male   DOB: 1934-01-15, 78 y.o.   MRN: [id number] 6 Days Post-Op  Subjective: Pt feels ok.  Passing flatus and had a BM.  No nausea  Objective: Vital signs in last 24 hours: Temp:  [98.3 F (36.8 C)-99.5 F (37.5 C)] 98.3 F (36.8 C) (08/29 0521) Pulse Rate:  [54-87] 54  (08/29 0521) Resp:  [12-20] 12  (08/29 0521) BP: (143-170)/(61-79) 170/79 mmHg (08/29 0521) SpO2:  [97 %-100 %] 100 % (08/29 0521) Weight:  [182 lb 5.1 oz (82.7 kg)] 182 lb 5.1 oz (82.7 kg) (08/29 0423) Last BM Date: 05/01/12  Intake/Output from previous day: 08/28 0701 - 08/29 0700 In: 1893.2 [WGN:5621.3] Out: 1075 [Urine:1075] Intake/Output this shift:    PE: Abd: soft, +BS, wound c/d/i with staples present.  Minimal tenderness, but appropriate.  Lab Results:   [facility] 04/30/12 0439  WBC 20.3*  HGB 10.9*  HCT 33.2*  PLT 261   BMET  Basename 05/02/12 0625 05/01/12 0505  NA 138 138  K 3.3* 4.0  CL 101 101  CO2 29 33*  GLUCOSE 132* 158*  BUN 29* 31*  CREATININE 1.14 1.17  CALCIUM 8.2* 8.2*   PT/INR No results found for this basename: LABPROT:2,INR:2 in the last 72 hours CMP     Component Value Date/Time   NA 138 05/02/2012 0625   K 3.3* 05/02/2012 0625   CL 101 05/02/2012 0625   CO2 29 05/02/2012 0625   GLUCOSE 132* 05/02/2012 0625   BUN 29* 05/02/2012 0625   CREATININE 1.14 05/02/2012 0625   CALCIUM 8.2* 05/02/2012 0625   PROT 6.7 05/02/2012 0625   ALBUMIN 2.7* 05/02/2012 0625   AST 51* 05/02/2012 0625   ALT 93* 05/02/2012 0625   ALKPHOS 107 05/02/2012 0625   BILITOT 0.3 05/02/2012 0625   GFRNONAA 61* 05/02/2012 0625   GFRAA 70* 05/02/2012 0625   Lipase     Component Value Date/Time   LIPASE 20 04/27/2012 1420       Studies/Results: No results found.  Anti-infectives: Anti-infectives     Start     Dose/Rate Route Frequency Ordered Stop   04/29/12 1500   levofloxacin (LEVAQUIN) IVPB 750 mg        750 mg 100 mL/hr over 90 Minutes Intravenous Every 48  hours 04/29/12 1229 05/05/12 1459   04/28/12 1200   vancomycin (VANCOCIN) 750 mg in sodium chloride 0.9 % 150 mL IVPB  Status:  Discontinued        750 mg 150 mL/hr over 60 Minutes Intravenous Every 12 hours 04/28/12 1149 04/29/12 1226   04/27/12 1600   vancomycin (VANCOCIN) 1,500 mg in sodium chloride 0.9 % 500 mL IVPB        1,500 mg 250 mL/hr over 120 Minutes Intravenous  Once 04/27/12 1414 04/27/12 1821   04/27/12 1500   levofloxacin (LEVAQUIN) IVPB 750 mg  Status:  Discontinued        750 mg 100 mL/hr over 90 Minutes Intravenous Every 48 hours 04/27/12 1414 04/29/12 1229   04/27/12 1445   piperacillin-tazobactam (ZOSYN) IVPB 3.375 g  Status:  Discontinued        3.375 g 12.5 mL/hr over 240 Minutes Intravenous 3 times per day 04/27/12 1414 04/29/12 1226   04/25/12 1605   cefOXitin (MEFOXIN) 2 g in dextrose 5 % 50 mL IVPB  Status:  Discontinued        2 g 100 mL/hr over 30 Minutes Intravenous 60 min pre-op 04/25/12 1605 04/26/12  1039   04/24/12 0930   azithromycin (ZITHROMAX) 500 mg in dextrose 5 % 250 mL IVPB  Status:  Discontinued        500 mg 250 mL/hr over 60 Minutes Intravenous Every 24 hours 04/24/12 0753 04/25/12 1251   04/24/12 0900   cefTRIAXone (ROCEPHIN) 1 g in dextrose 5 % 50 mL IVPB  Status:  Discontinued        1 g 100 mL/hr over 30 Minutes Intravenous Every 24 hours 04/24/12 0753 04/25/12 1252           Assessment/Plan  1. S/p ex lap  2. Post op ileus, resolving  Plan: 1. Will start on clear liquid diet today 2. Cont to mobilize and pulm toilet   LOS: 10 days    Floris Neuhaus E 05/02/2012

## 2012-05-02 NOTE — Progress Notes (Signed)
PARENTERAL NUTRITION CONSULT NOTE - FOLLOW UP  Pharmacy Consult for TPN Indication: Adynamic ileus   No Known Allergies  Patient Measurements: Height: 5\' 2"  (157.5 cm) Weight: 182 lb 5.1 oz (82.7 kg) IBW/kg (Calculated) : 54.6  Adjusted Body Weight: 60 Kg  Vital Signs: Temp: 98.3 F (36.8 C) (08/29 0521) Temp src: Oral (08/29 0521) BP: 170/79 mmHg (08/29 0521) Pulse Rate: 54  (08/29 0521) Intake/Output from previous day: 08/28 0701 - 08/29 0700 In: 1893.2 [ZOX:0960.4] Out: 1075 [Urine:1075] Intake/Output from this shift:    Labs:  [facility] 04/30/12 0439  WBC 20.3*  HGB 10.9*  HCT 33.2*  PLT 261  APTT --  INR --     Basename 05/02/12 0625 05/01/12 0505 04/30/12 0439  NA 138 138 140  K 3.3* 4.0 3.9  CL 101 101 102  CO2 29 33* 30  GLUCOSE 132* 158* 205*  BUN 29* 31* 24*  CREATININE 1.14 1.17 1.19  LABCREA -- -- --  CREAT24HRUR -- -- --  CALCIUM 8.2* 8.2* 8.0*  MG 2.3 -- 2.5  PHOS 2.4 -- --  PROT 6.7 -- 6.6  ALBUMIN 2.7* -- 2.4*  AST 51* -- 56*  ALT 93* -- 56*  ALKPHOS 107 -- 108  BILITOT 0.3 -- 0.2*  BILIDIR -- -- --  IBILI -- -- --  PREALBUMIN -- -- --  TRIG -- -- --  CHOLHDL -- -- --  CHOL -- -- --   Estimated Creatinine Clearance: 51.3 ml/min (by C-G formula based on Cr of 1.14).    Basename 05/02/12 0743 05/02/12 0352 05/01/12 2354  GLUCAP 143* 158* 146*    Insulin Requirements in the past 12 hours:  11 units Novolog SSI + Regular insulin 15 units in TPN  Current Nutrition: TPN (CLINIMIX E 5/15) at 75 mL/hr + lipids 66ml/hr on M/W/F Diet: Clear liquids  Nutritional Goals (per RD):  1600-1800 kcal, 90-105 grams of protein per day   Assessment: Patient admitted to [facility] with epigastric pain, chest discomfort and abd distention. s/p exp lap (8/23), findings consistent with adynamic ileus, no obstruction found.  VW:[id number] ileus seems to be resolving per CCS. NGT out. +flatus. Last BM 8/28. Starting clear liquid diet today. Continues on  Reglan- QTc=410 msec today. Endo: No prior hx of DM, CBGs well controlled but still requiring a decent amt of SSI. No TSH check this admit, cont on home levothyroxine. Off steroids. Lytes: K down to 3.3 (goal ~4 with ileus). Mg at goal for ileus. Corrected Ca is 9.2. Other lytes ok. Renal: Noted Scr back to nl.  Cards: BP high. HR wnl.  Hepatobil: LFTs WNL at baseline. Prealbumin 6.2 - low. Triglycerides higher than goal of 150, will watch closely. ID: Afeb, WBC increased to 20.3. Levaquin Day #6/8 for PNA. Azithromycin 8/21 >>8/22 Ceftriaxone 8/21 >> 8/22 Cefoxitin 8/22 x 1 dose pre-op Vanc 8/24 >>8/26 Zosyn 8/24 >>8/26 Levaquin 8/24 >>stops 9/1  8/22, 24: MRSA PCR neg  8/24: trach asp: Neg 8/25 trach asp:  Neg 8/24: Blood x2: NGTD Best Practices: SQ hep, PPI IV  Central access: CVC (triple lumen)- placed 8/23  Plan:  - Increase Clinimix E 5/15 to goal rate of 80cc/hr +  lipids at 34ml/hr M/W/F. This will provide an average of 1568 kcal and 90 gm protein per day. - Will give KCl IV x 4 runs today - Will supplement MVI, select trace elements and IV lipids at 10cc/hr on MWF only d/t Publishing copy. - F/u tolerance of po diet  and ability to wean/d/c TPN  Thanks, Christoper Fabian, PharmD, BCPS Clinical pharmacist, pager [phone] 05/02/2012 10:41 AM

## 2012-05-02 NOTE — Progress Notes (Signed)
Rehab admissions - We have received a denial from [facility] for acute inpatient rehab.  Options will be SNF or home with [facility] therapies.  If attending MD wishes to do a peer to peer with medical director at [facility], please call me and I can facilitate.  Recommend pursuit of SNF or HH therapies at this point.  Call me for questions.  #[phone]

## 2012-05-02 NOTE — Progress Notes (Signed)
Agree with above. Advance diet Ambulate.  Wound OK  Wilmon Arms. Corliss Skains, MD, [facility] Surgery  05/02/2012 10:20 AM

## 2012-05-02 NOTE — Progress Notes (Addendum)
[facility] PROGRESS NOTE  Victor Hunter NWG:[id number] DOB: 11/26/1933 DOA: 04/22/2012 PCP: Avon Gully, MD  Assessment/Plan: Principal Problem:  *Post-op Acute respiratory failure Active Problems:  Respiratory alkalosis  Aspiration pneumonia  Hypokalemia  Acute renal failure  S/P exploratory laparotomy  Adynamic ileus  CAP (community acquired pneumonia)  Protein calorie malnutrition  Fever  UTI (lower urinary tract infection)  Leukocytosis  Transaminitis  Hypothyroidism  Sepsis associated hypotension    1. Post-op Acute respiratory failure:  Patient was transferred from  [facility] with respiratory failure, on ventilator, and difficulty to wean. This was multifactorial, due to respiratory alkalosis, aspiration pneumonia vs CAP (community acquired pneumonia), as well as super-added effect of pain, hypoxemia a degree of volume overload. Patient is doing well clinically, respiratory failure has resolved, and he is saturating at 985-100% on room air. Will disicontinue continuous pulse oximetry. Will conclude antibiotic treatment on 05/07/12. Continue supportive care as well as pulmonary toileting and mobilization  2. Shock/? Sepsis associated hypotension:   This was addressed with antibiotics, and volume resuscitation, and has now resolved. Stress dose steroids have been weaned and finally discontinued on 05/01/12.  S/P exploratory laparotomy/ Adynamic ileus  Patient initially presented with clinical features of bowel obstruction, but was found to have adynamic ileus during surgery. Clinically, this has resolved with bowel rest, mobilization, and parenteral TNA. Patient has been commenced on fluids today, and diet will be advanced as tolerated. As oral intake improves, TNA will be weaned. Management per surgical team  3. Acute renal failure:   Patient's creatinine reached a peak of 1.63 during hospitalization, against a known baseline of 0.93, consistent with acute kidney injury. As of  today, creatinine is within normal limits, at 1.14. AKI has resolved. 4. Leukocytosis/Fever/UTI (lower urinary tract infection):   White cell count has been trended upward, associated with a low-grade fever. Leukocytosis could primarily be related to steroids, but urinalysis on 04/27/2012 appeared to be consistent with a urinary tract infection. Repeat urinalysis and culture was ordered on 05/01/12. Levaquin therapy should prove adequate.   5. Hypokalemia:   Repleted as indicated. Resolved.   6. Protein calorie malnutrition:   Patient has been NPO for a significant amount of time, and is also hypoalbuminemic, consistent with moderate protein-calorie malnutrition. Currently on parenteral nutrition with pharmacy dosing. Hopefully, weaning will commence in the next few days.   7. Transaminitis  Likely secondary to parenteral nutrition: This is mild.   8. Hypothyroidism:   On thyroxine replacement therapy, albeit parenterally. Will aim transition to oral formulation, in next day or two. 9. Deconditioning: Patient is profoundly deconditioned by recent acute medical problems. He has been evaluated by rehab MD, and considered appropriate for CIR.   Code Status: Full Code. Family Communication:  Disposition Plan: Aiming CIR when stable.    Brief narrative: 78 year old male patient initially [facility] on 04/22/2012 with abdominal distention felt to be related to small bowel obstruction. He was subsequently taken to the operating room where he underwent exploratory laparotomy but no abnormal abdominal pathology found except for adynamic ileus. Preoperatively patient had been previously diagnosed with community acquired pneumonia versus aspiration pneumonia. On 04/27/2012 they were unable to wean him from the ventilator and therefore he was transferred to [facility] for further evaluation. He was accepted by pulmonary critical care medicine. He was subsequently extubated on 04/28/2012. Surgery has  been following the patient since arrival. He is currently on parenteral nutrition and his NG tube has recently been discontinued and he is  tolerating ice chips without difficulty. He was deemed appropriate to transfer to the step down unit on 04/30/2012. [facility] team 1 accepted care of this patient on 05/01/2012.   Consultants:  Dr Harriette Bouillon, surgeon.  Dr Faith Rogue, physical Medicine and rehab  Procedures:  Serial CXRs.   Antibiotics:  Azithromycin 04/24/12 only.  Rocephin 04/24/12-04/25/12.  Levaquin 04/28/11>>  Zosyn 04/27/12-04/28/12.  Vancomycin 04/27/12-04/29/12.  HPI/Subjective: No new issues.   Objective: Vital signs in last 24 hours: Temp:  [98.3 F (36.8 C)-99.5 F (37.5 C)] 98.3 F (36.8 C) (08/29 0521) Pulse Rate:  [54-87] 54  (08/29 0521) Resp:  [12-20] 12  (08/29 0521) BP: (143-170)/(61-79) 170/79 mmHg (08/29 0521) SpO2:  [97 %-100 %] 100 % (08/29 0521) Weight:  [82.7 kg (182 lb 5.1 oz)] 82.7 kg (182 lb 5.1 oz) (08/29 0423) Weight change: -0.7 kg (-1 lb 8.7 oz) Last BM Date: 05/01/12  Intake/Output from previous day: 08/28 0701 - 08/29 0700 In: 1893.2 [GEX:5284.1] Out: 1075 [Urine:1075]     Physical Exam: General: Comfortable, alert, communicative, fully oriented, not short of breath at rest.  HEENT:  Mild clinical pallor, no jaundice, no conjunctival injection or discharge. NECK:  Supple, JVP not seen, no carotid bruits, no palpable lymphadenopathy, no palpable goiter. CHEST:  Clinically clear to auscultation, no wheezes, no crackles. HEART:  Sounds 1 and 2 heard, normal, regular, no murmurs. ABDOMEN:  Full, soft, non-tender, no palpable organomegaly, no palpable masses, normal bowel sounds. Surgical wound is healing well.  GENITALIA:  Not examined. LOWER EXTREMITIES:  No pitting edema, palpable peripheral pulses. MUSCULOSKELETAL SYSTEM:  Generalized osteoarthritic changes, otherwise, normal. CENTRAL NERVOUS SYSTEM:  No focal  neurologic deficit on gross examination.  Lab Results:  [facility] 04/30/12 0439  WBC 20.3*  HGB 10.9*  HCT 33.2*  PLT 261    Basename 05/02/12 0625 05/01/12 0505  NA 138 138  K 3.3* 4.0  CL 101 101  CO2 29 33*  GLUCOSE 132* 158*  BUN 29* 31*  CREATININE 1.14 1.17  CALCIUM 8.2* 8.2*   Recent Results (from the past 240 hour(s))  SURGICAL PCR SCREEN     Status: Abnormal   Collection Time   04/25/12  7:59 PM      Component Value Range Status Comment   MRSA, PCR NEGATIVE  NEGATIVE Final    Staphylococcus aureus POSITIVE (*) NEGATIVE Final   CULTURE, BLOOD (ROUTINE X 2)     Status: Normal (Preliminary result)   Collection Time   04/27/12  2:50 PM      Component Value Range Status Comment   Specimen Description BLOOD RIGHT ARM   Final    Special Requests BOTTLES DRAWN AEROBIC AND ANAEROBIC 10CC   Final    Culture  Setup Time 04/27/2012 20:04   Final    Culture     Final    Value:        BLOOD CULTURE RECEIVED NO GROWTH TO DATE CULTURE WILL BE HELD FOR 5 DAYS BEFORE ISSUING A FINAL NEGATIVE REPORT   Report Status PENDING   Incomplete   CULTURE, BLOOD (ROUTINE X 2)     Status: Normal (Preliminary result)   Collection Time   04/27/12  2:52 PM      Component Value Range Status Comment   Specimen Description BLOOD LEFT ARM   Final    Special Requests BOTTLES DRAWN AEROBIC AND ANAEROBIC 10CC   Final    Culture  Setup Time 04/27/2012 20:04   Final  Culture     Final    Value:        BLOOD CULTURE RECEIVED NO GROWTH TO DATE CULTURE WILL BE HELD FOR 5 DAYS BEFORE ISSUING A FINAL NEGATIVE REPORT   Report Status PENDING   Incomplete   MRSA PCR SCREENING     Status: Normal   Collection Time   04/27/12  3:07 PM      Component Value Range Status Comment   MRSA by PCR NEGATIVE  NEGATIVE Final   CULTURE, RESPIRATORY     Status: Normal   Collection Time   04/27/12  3:44 PM      Component Value Range Status Comment   Specimen Description TRACHEAL ASPIRATE   Final    Special Requests NONE    Final    Gram Stain     Final    Value: MODERATE WBC PRESENT, PREDOMINANTLY PMN     NO SQUAMOUS EPITHELIAL CELLS SEEN     NO ORGANISMS SEEN   Culture NO GROWTH 2 DAYS   Final    Report Status 04/30/2012 FINAL   Final   CULTURE, RESPIRATORY     Status: Normal   Collection Time   04/28/12  5:08 AM      Component Value Range Status Comment   Specimen Description TRACHEAL ASPIRATE   Final    Special Requests NONE   Final    Gram Stain     Final    Value: ABUNDANT WBC PRESENT, PREDOMINANTLY PMN     RARE SQUAMOUS EPITHELIAL CELLS PRESENT     NO ORGANISMS SEEN   Culture NO GROWTH 2 DAYS   Final    Report Status 04/30/2012 FINAL   Final      Studies/Results: No results found.  Medications: Scheduled Meds:   . antiseptic oral rinse  15 mL Mouth Rinse QID  . chlorhexidine  15 mL Mouth Rinse BID  . heparin  5,000 Units Subcutaneous Q8H  . insulin aspart  0-15 Units Subcutaneous Q4H  . levofloxacin (LEVAQUIN) IV  750 mg Intravenous Q48H  . levothyroxine  62.5 mcg Intravenous Daily  . metoCLOPramide (REGLAN) injection  5 mg Intravenous Q6H  . pantoprazole (PROTONIX) IV  40 mg Intravenous QAC breakfast  . sodium chloride  10-40 mL Intracatheter Q12H  . DISCONTD: insulin aspart  0-4 Units Subcutaneous Q4H  . DISCONTD: methylPREDNISolone (SOLU-MEDROL) injection  20 mg Intravenous Q12H   Continuous Infusions:   . fat emulsion 240 mL (05/01/12 1900)  . TPN (CLINIMIX) +/- additives 75 mL/hr at 04/30/12 1757  . TPN (CLINIMIX) +/- additives 75 mL/hr at 05/01/12 1900   PRN Meds:.ondansetron, sodium chloride, DISCONTD: sodium chloride, DISCONTD: fentaNYL    LOS: 10 days   Derreon Consalvo,CHRISTOPHER  [facility] Pager [phone]. If 8PM-8AM, please contact night-coverage at [email or web address], password [facility] 05/02/2012, 8:44 AM  LOS: 10 days

## 2012-05-03 DIAGNOSIS — E46 Unspecified protein-calorie malnutrition: Secondary | ICD-10-CM

## 2012-05-03 DIAGNOSIS — M7989 Other specified soft tissue disorders: Secondary | ICD-10-CM

## 2012-05-03 LAB — GLUCOSE, CAPILLARY
Glucose-Capillary: 187 mg/dL — ABNORMAL HIGH (ref 70–99)
Glucose-Capillary: 162 mg/dL — ABNORMAL HIGH (ref 70–99)
Glucose-Capillary: 109 mg/dL — ABNORMAL HIGH (ref 70–99)
Glucose-Capillary: 123 mg/dL — ABNORMAL HIGH (ref 70–99)
Glucose-Capillary: 129 mg/dL — ABNORMAL HIGH (ref 70–99)

## 2012-05-03 LAB — CBC
HCT: 32.5 % — ABNORMAL LOW (ref 39.0–52.0)
Platelets: 255 10*3/uL (ref 150–400)
RDW: 15.5 % (ref 11.5–15.5)
WBC: 10.8 10*3/uL — ABNORMAL HIGH (ref 4.0–10.5)

## 2012-05-03 LAB — COMPREHENSIVE METABOLIC PANEL
ALT: 105 U/L — ABNORMAL HIGH (ref 0–53)
AST: 47 U/L — ABNORMAL HIGH (ref 0–37)
Albumin: 2.4 g/dL — ABNORMAL LOW (ref 3.5–5.2)
Alkaline Phosphatase: 98 U/L (ref 39–117)
Chloride: 100 mEq/L (ref 96–112)
Potassium: 3.6 mEq/L (ref 3.5–5.1)
Total Bilirubin: 0.3 mg/dL (ref 0.3–1.2)

## 2012-05-03 LAB — CULTURE, BLOOD (ROUTINE X 2): Culture: NO GROWTH

## 2012-05-03 MED ORDER — LISINOPRIL 20 MG PO TABS
20.0000 mg | ORAL_TABLET | Freq: Every day | ORAL | Status: DC
  Administered 2012-05-03 – 2012-05-07 (×5): 20 mg via ORAL
  Filled 2012-05-03 (×5): qty 1

## 2012-05-03 MED ORDER — WARFARIN SODIUM 7.5 MG PO TABS
7.5000 mg | ORAL_TABLET | Freq: Once | ORAL | Status: AC
  Administered 2012-05-03: 7.5 mg via ORAL
  Filled 2012-05-03: qty 1

## 2012-05-03 MED ORDER — PATIENT'S GUIDE TO USING COUMADIN BOOK
Freq: Once | Status: AC
  Administered 2012-05-04: 08:00:00
  Filled 2012-05-03: qty 1

## 2012-05-03 MED ORDER — HYDROCHLOROTHIAZIDE 25 MG PO TABS
25.0000 mg | ORAL_TABLET | Freq: Every day | ORAL | Status: DC
  Administered 2012-05-03 – 2012-05-07 (×5): 25 mg via ORAL
  Filled 2012-05-03 (×5): qty 1

## 2012-05-03 MED ORDER — INSULIN ASPART 100 UNIT/ML ~~LOC~~ SOLN: 0.0000 [IU] | Freq: Three times a day (TID) | SUBCUTANEOUS | Status: DC

## 2012-05-03 MED ORDER — PANTOPRAZOLE SODIUM 40 MG PO TBEC
40.0000 mg | DELAYED_RELEASE_TABLET | Freq: Every day | ORAL | Status: DC
  Administered 2012-05-03 – 2012-05-07 (×5): 40 mg via ORAL
  Filled 2012-05-03 (×5): qty 1

## 2012-05-03 MED ORDER — CHLORHEXIDINE GLUCONATE 0.12 % MT SOLN
15.0000 mL | Freq: Two times a day (BID) | OROMUCOSAL | Status: DC
  Administered 2012-05-03 – 2012-05-06 (×6): 15 mL via OROMUCOSAL
  Filled 2012-05-03 (×6): qty 15

## 2012-05-03 MED ORDER — BIOTENE DRY MOUTH MT LIQD
15.0000 mL | Freq: Two times a day (BID) | OROMUCOSAL | Status: DC
  Administered 2012-05-03 – 2012-05-06 (×7): 15 mL via OROMUCOSAL

## 2012-05-03 MED ORDER — ENOXAPARIN SODIUM 80 MG/0.8ML ~~LOC~~ SOLN
80.0000 mg | Freq: Two times a day (BID) | SUBCUTANEOUS | Status: DC
  Administered 2012-05-03 – 2012-05-06 (×6): 80 mg via SUBCUTANEOUS
  Filled 2012-05-03 (×9): qty 0.8

## 2012-05-03 MED ORDER — METOCLOPRAMIDE HCL 5 MG PO TABS
5.0000 mg | ORAL_TABLET | Freq: Three times a day (TID) | ORAL | Status: DC
  Administered 2012-05-03 – 2012-05-07 (×16): 5 mg via ORAL
  Filled 2012-05-03 (×21): qty 1

## 2012-05-03 MED ORDER — LEVOTHYROXINE SODIUM 125 MCG PO TABS
125.0000 ug | ORAL_TABLET | Freq: Every day | ORAL | Status: DC
  Administered 2012-05-04 – 2012-05-07 (×4): 125 ug via ORAL
  Filled 2012-05-03 (×5): qty 1

## 2012-05-03 MED ORDER — CLONIDINE HCL 0.1 MG PO TABS
0.1000 mg | ORAL_TABLET | Freq: Three times a day (TID) | ORAL | Status: DC | PRN
  Filled 2012-05-03: qty 1

## 2012-05-03 MED ORDER — WARFARIN - PHARMACIST DOSING INPATIENT: Freq: Every day | Status: DC

## 2012-05-03 MED ORDER — INSULIN REGULAR HUMAN 100 UNIT/ML IJ SOLN
INTRAVENOUS | Status: AC
  Filled 2012-05-03: qty 2000

## 2012-05-03 MED ORDER — WARFARIN VIDEO
Freq: Once | Status: AC
Start: 2012-05-03 — End: 2012-05-03
  Administered 2012-05-03: 20:00:00

## 2012-05-03 MED ORDER — TAMSULOSIN HCL 0.4 MG PO CAPS
0.4000 mg | ORAL_CAPSULE | Freq: Every day | ORAL | Status: DC
  Administered 2012-05-03 – 2012-05-07 (×5): 0.4 mg via ORAL
  Filled 2012-05-03 (×5): qty 1

## 2012-05-03 NOTE — Progress Notes (Signed)
PARENTERAL NUTRITION CONSULT NOTE - FOLLOW UP  Pharmacy Consult for TPN Indication: Adynamic ileus   No Known Allergies  Patient Measurements: Height: 5\' 2"  (157.5 cm) Weight: 181 lb 10.5 oz (82.4 kg) IBW/kg (Calculated) : 54.6  Adjusted Body Weight: 60 Kg  Vital Signs: Temp: 99 F (37.2 C) (08/30 0640) Temp src: Oral (08/30 0223) BP: 183/76 mmHg (08/30 0640) Pulse Rate: 62  (08/30 0640) Intake/Output from previous day: 08/29 0701 - 08/30 0700 In: 2422.1 [P.O.:620; TPN:1802.1] Out: 1175 [Urine:1175] Intake/Output from this shift:    Labs:  Basename 05/03/12 0500  WBC 10.8*  HGB 10.7*  HCT 32.5*  PLT 255  APTT --  INR --     Basename 05/03/12 0500 05/02/12 0625 05/01/12 0505  NA 134* 138 138  K 3.6 3.3* 4.0  CL 100 101 101  CO2 29 29 33*  GLUCOSE 180* 132* 158*  BUN 21 29* 31*  CREATININE 1.06 1.14 1.17  LABCREA -- -- --  CREAT24HRUR -- -- --  CALCIUM 7.9* 8.2* 8.2*  MG -- 2.3 --  PHOS -- 2.4 --  PROT 6.2 6.7 --  ALBUMIN 2.4* 2.7* --  AST 47* 51* --  ALT 105* 93* --  ALKPHOS 98 107 --  BILITOT 0.3 0.3 --  BILIDIR -- -- --  IBILI -- -- --  PREALBUMIN -- -- --  TRIG -- -- --  CHOLHDL -- -- --  CHOL -- -- --   Estimated Creatinine Clearance: 55.1 ml/min (by C-G formula based on Cr of 1.06).    Basename 05/03/12 0745 05/03/12 0338 05/02/12 2337  GLUCAP 162* 187* 119*    Insulin Requirements in the past 12 hours:  9 units Novolog SSI + Regular insulin 15 units in TPN  Current Nutrition: TPN (CLINIMIX E 5/15) at 75 mL/hr + lipids 22ml/hr on M/W/F Diet: Clear liquids  Nutritional Goals (per RD):  1600-1800 kcal, 90-105 grams of protein per day   Assessment: Patient admitted to [facility] with epigastric pain, chest discomfort and abd distention. s/p exp lap (8/23), findings consistent with adynamic ileus, no obstruction found.  RU:[id number] ileus seems to be resolving per CCS. NGT out. +flatus. Last BM 8/28. Tolerating clear liquid diet and now  advancing to full liquid diet with instructions to wean TPN to off. Continues on Reglan Endo: No prior hx of DM but A1c 6.5 this admit, CBGs well controlled but still requiring a decent amt of SSI. No TSH check this admit, cont on home levothyroxine. Off steroids. Lytes: K 3.6 with goal of ~4 with ileus but ileus now resolved).  Renal: Noted Scr back to nl.  Cards: BP 183/76. HR 62 - Hepatobil: LFTs WNL at baseline. Prealbumin 6.2 - low. Triglycerides higher than goal of 150, will watch closely. ID: Afeb, WBC down to 10.8. Levaquin Day #7/8 for PNA. Azithromycin 8/21 >>8/22 Ceftriaxone 8/21 >> 8/22 Cefoxitin 8/22 x 1 dose pre-op Vanc 8/24 >>8/26 Zosyn 8/24 >>8/26 Levaquin 8/24 >>stops 9/1  8/22, 24: MRSA PCR neg  8/24: trach asp: Neg 8/25 trach asp:  Neg 8/24: Blood x2: NGTD Best Practices: SQ hep, PPI IV  Central access: CVC (triple lumen)- placed 8/23  Plan:  1. Decrease Clinimix E 5/15 to 40cc/hr in order to DC per MD instructions at 1800 today 2. Continue levaquin 750mg  IV Q48H 3. F/u renal fxn (remains borderline so will hold off on adjusting levaquin dose) 4. Consider changing levaquin, levothyroxine, reglan, protonix to PO if continues to tolerate PO diet  Lysle Pearl,  PharmD, BCPS Pager # [phone] 05/03/2012 8:50 AM

## 2012-05-03 NOTE — Progress Notes (Signed)
Per MD order, central line removed. IV cathter intact. Vaseline pressure gauze to site, pressure held x 5 min, no bleeding to site. Pt instructed not to get out of bed for 30 min after the removal of the central line. Instucted to keep dressing CDI x 24hours, if bleeding occurs hold pressure, if bleeding does not stop contact MD or go to the ED. Pt verbalized understanding and did not have any questions. Victor Hunter  

## 2012-05-03 NOTE — Progress Notes (Addendum)
VASCULAR LAB PRELIMINARY  PRELIMINARY  PRELIMINARY  PRELIMINARY  Left upper extremity venous completed.    Preliminary report: Positive for acute DVT of the left radial and ulnar veins. Limited visibility in the subclavian area due to IV.  Shanetta Nicolls, RVS 05/03/2012, 4:19 PM  Simonetti, Myer Peer, RDMS 05/03/2012, 4:00 PM

## 2012-05-03 NOTE — Progress Notes (Signed)
Physical Therapy Treatment Patient Details Name: Victor Hunter MRN: [id number] DOB: [birth year] Today's Date: 05/03/2012 Time: 4098-1191 PT Time Calculation (min): 16 min  PT Assessment / Plan / Recommendation Comments on Treatment Session  Patient progressing well with ambulation and motivated to progress with therapy.     Follow Up Recommendations  Skilled nursing facility    Barriers to Discharge        Equipment Recommendations  3 in 1 bedside comode;Rolling walker with 5" wheels    Recommendations for Other Services    Frequency Min 3X/week   Plan Discharge plan remains appropriate;Frequency remains appropriate    Precautions / Restrictions Precautions Precautions: Fall Other Brace/Splint: abdominal binder Restrictions Weight Bearing Restrictions: No   Pertinent Vitals/Pain     Mobility  Bed Mobility Bed Mobility: Not assessed Transfers Sit to Stand: 4: Min guard;With upper extremity assist;From chair/3-in-1 Stand to Sit: 4: Min guard;With upper extremity assist;To chair/3-in-1 Details for Transfer Assistance: cues for safe hand placement  Ambulation/Gait Ambulation/Gait Assistance: 4: Min guard Ambulation Distance (Feet): 160 Feet Assistive device: Rolling walker Ambulation/Gait Assistance Details: Cues for posture and to step closer into RW Gait Pattern: Step-through pattern;Trunk flexed    Exercises     PT Diagnosis:    PT Problem List:   PT Treatment Interventions:     PT Goals Acute Rehab PT Goals PT Goal: Sit to Stand - Progress: Progressing toward goal PT Goal: Stand to Sit - Progress: Progressing toward goal PT Goal: Ambulate - Progress: Progressing toward goal  Visit Information  Last PT Received On: 05/03/12 Assistance Needed: +1    Subjective Data      Cognition  Overall Cognitive Status: Appears within functional limits for tasks assessed/performed Arousal/Alertness: Awake/alert Orientation Level: Appears intact for tasks  assessed Behavior During Session: [facility] for tasks performed    Balance     End of Session PT - End of Session Equipment Utilized During Treatment: Gait belt Activity Tolerance: Patient tolerated treatment well Patient left: in chair;with call bell/phone within reach Nurse Communication: Mobility status   GP     Fredrich Birks 05/03/2012, 11:45 AM 05/03/2012 Fredrich Birks PTA [phone] pager [phone] office

## 2012-05-03 NOTE — Progress Notes (Signed)
Patient ID: Victor Hunter, male   DOB: Jan 10, 1934, 78 y.o.   MRN: [id number] 7 Days Post-Op  Subjective: Pt feeling well.  Still with flatus and BMs.  Tolerating clear liquids  Objective: Vital signs in last 24 hours: Temp:  [98.3 F (36.8 C)-99.1 F (37.3 C)] 99 F (37.2 C) (08/30 0640) Pulse Rate:  [61-66] 62  (08/30 0640) Resp:  [15-18] 18  (08/30 0640) BP: (159-183)/(72-85) 183/76 mmHg (08/30 0640) SpO2:  [96 %-100 %] 96 % (08/30 0640) Weight:  [181 lb 10.5 oz (82.4 kg)] 181 lb 10.5 oz (82.4 kg) (08/30 0640) Last BM Date: 05/02/12  Intake/Output from previous day: 08/29 0701 - 08/30 0700 In: 2422.1 [P.O.:620; TPN:1802.1] Out: 1175 [Urine:1175] Intake/Output this shift:    PE: Abd: soft, minimally tender, +BS, obese, incision c/d/i with staples  Lab Results:   Basename 05/03/12 0500  WBC 10.8*  HGB 10.7*  HCT 32.5*  PLT 255   BMET  Basename 05/03/12 0500 05/02/12 0625  NA 134* 138  K 3.6 3.3*  CL 100 101  CO2 29 29  GLUCOSE 180* 132*  BUN 21 29*  CREATININE 1.06 1.14  CALCIUM 7.9* 8.2*   PT/INR No results found for this basename: LABPROT:2,INR:2 in the last 72 hours CMP     Component Value Date/Time   NA 134* 05/03/2012 0500   K 3.6 05/03/2012 0500   CL 100 05/03/2012 0500   CO2 29 05/03/2012 0500   GLUCOSE 180* 05/03/2012 0500   BUN 21 05/03/2012 0500   CREATININE 1.06 05/03/2012 0500   CALCIUM 7.9* 05/03/2012 0500   PROT 6.2 05/03/2012 0500   ALBUMIN 2.4* 05/03/2012 0500   AST 47* 05/03/2012 0500   ALT 105* 05/03/2012 0500   ALKPHOS 98 05/03/2012 0500   BILITOT 0.3 05/03/2012 0500   GFRNONAA 66* 05/03/2012 0500   GFRAA 77* 05/03/2012 0500   Lipase     Component Value Date/Time   LIPASE 20 04/27/2012 1420       Studies/Results: No results found.  Anti-infectives: Anti-infectives     Start     Dose/Rate Route Frequency Ordered Stop   04/29/12 1500   levofloxacin (LEVAQUIN) IVPB 750 mg        750 mg 100 mL/hr over 90 Minutes Intravenous Every  48 hours 04/29/12 1229 05/05/12 1459   04/28/12 1200   vancomycin (VANCOCIN) 750 mg in sodium chloride 0.9 % 150 mL IVPB  Status:  Discontinued        750 mg 150 mL/hr over 60 Minutes Intravenous Every 12 hours 04/28/12 1149 04/29/12 1226   04/27/12 1600   vancomycin (VANCOCIN) 1,500 mg in sodium chloride 0.9 % 500 mL IVPB        1,500 mg 250 mL/hr over 120 Minutes Intravenous  Once 04/27/12 1414 04/27/12 1821   04/27/12 1500   levofloxacin (LEVAQUIN) IVPB 750 mg  Status:  Discontinued        750 mg 100 mL/hr over 90 Minutes Intravenous Every 48 hours 04/27/12 1414 04/29/12 1229   04/27/12 1445   piperacillin-tazobactam (ZOSYN) IVPB 3.375 g  Status:  Discontinued        3.375 g 12.5 mL/hr over 240 Minutes Intravenous 3 times per day 04/27/12 1414 04/29/12 1226   04/25/12 1605   cefOXitin (MEFOXIN) 2 g in dextrose 5 % 50 mL IVPB  Status:  Discontinued        2 g 100 mL/hr over 30 Minutes Intravenous 60 min pre-op 04/25/12 1605 04/26/12 1039  04/24/12 0930   azithromycin (ZITHROMAX) 500 mg in dextrose 5 % 250 mL IVPB  Status:  Discontinued        500 mg 250 mL/hr over 60 Minutes Intravenous Every 24 hours 04/24/12 0753 04/25/12 1251   04/24/12 0900   cefTRIAXone (ROCEPHIN) 1 g in dextrose 5 % 50 mL IVPB  Status:  Discontinued        1 g 100 mL/hr over 30 Minutes Intravenous Every 24 hours 04/24/12 0753 04/25/12 1252           Assessment/Plan  1. S/p ex lap 2. Post op ileus, resolving 3. PCM/TNA  Plan: 1. Wean TNA to off, per pharmacy 2. Advance to full liquid diet 3. POD 7, leave staples in place   LOS: 11 days    Victor Hunter E 05/03/2012

## 2012-05-03 NOTE — Progress Notes (Signed)
[facility] PROGRESS NOTE  Victor Hunter ZOX:[id number] DOB: July 27, 1934 DOA: 04/22/2012 PCP: Avon Gully, MD  Assessment/Plan: Principal Problem:  *Post-op Acute respiratory failure Active Problems:  Respiratory alkalosis  Aspiration pneumonia  Hypokalemia  Acute renal failure  S/P exploratory laparotomy  Adynamic ileus  CAP (community acquired pneumonia)  Protein calorie malnutrition  Fever  UTI (lower urinary tract infection)  Leukocytosis  Transaminitis  Hypothyroidism  Sepsis associated hypotension    1. Post-op Acute respiratory failure:  Patient was transferred from  [facility] with respiratory failure, on ventilator, and difficulty to wean. This was multifactorial, due to respiratory alkalosis, aspiration pneumonia vs CAP (community acquired pneumonia), as well as super-added effect of pain, hypoxemia a degree of volume overload. Patient is doing well clinically, respiratory failure has resolved, and he is saturating at 985-100% on room air. Continuous pulse oximetry was discontinued on 05/02/12. Will conclude antibiotic treatment today.  2. Shock/? Sepsis associated hypotension:   This was addressed with antibiotics, and volume resuscitation, and has now resolved. Stress dose steroids have been weaned and finally discontinued on 05/01/12.  S/P exploratory laparotomy/ Adynamic ileus  Patient initially presented with clinical features of bowel obstruction, but was found to have adynamic ileus during surgery. Clinically, this has resolved with bowel rest, mobilization, and parenteral TNA. Patient has been commenced on oral fluids on 05/02/12 and diet advanced to full liquids today.. As oral intake improves, TNA will be weaned. Management per surgical team  3. Acute renal failure:   Patient's creatinine reached a peak of 1.63 during hospitalization, against a known baseline of 0.93, consistent with acute kidney injury. As of today, creatinine is within normal limits, at 1.06. AKI  has resolved. 4. Leukocytosis/Fever/UTI (lower urinary tract infection):   White cell count had initially trended upward, associated with a low-grade fever. Leukocytosis could primarily be related to steroids, but urinalysis on 04/27/2012 appeared to be consistent with a urinary tract infection. Repeat urinalysis and culture was ordered on 05/01/12 and remained negative. Levaquin therapy should prove adequate, and as described above, has been concluded today. Wcc has normalized.  5. Hypokalemia:   Repleted as indicated. Resolved.   6. Protein calorie malnutrition:   Patient has been NPO for a significant amount of time, and is also hypoalbuminemic, consistent with moderate protein-calorie malnutrition. Currently on parenteral nutrition with pharmacy dosing. Weaning is in progress. 7. Transaminitis  Likely secondary to parenteral nutrition: This is mild, and of no clinical consequence.   8. Hypothyroidism:   On thyroxine replacement therapy, albeit parenterally. Transitioned to oral formulation today. 9. Deconditioning: Patient is profoundly deconditioned by recent acute medical problems. He has been evaluated by rehab MD, and considered appropriate for CIR. Due to insurance issues, he will be discharged to ST-SNF, when stable. 10: LUE swelling: Patient has moderate LUE edema. As he has a central line, will scan for possible DVT.   Code Status: Full Code. Family Communication:  Disposition Plan: Aiming SNF when stable.    Brief narrative: 78 year old male patient [facility] on [facility]/2012 with abdominal distention felt to be related to small bowel obstruction. He was subsequently taken to the operating room where he underwent exploratory laparotomy but no abnormal abdominal pathology found except for adynamic ileus. Preoperatively patient had been previously diagnosed with community acquired pneumonia versus aspiration pneumonia. On 04/27/2012 they were unable to wean him from  the ventilator and therefore he was transferred to [facility] for further evaluation. He was accepted by pulmonary critical care medicine. He was  subsequently extubated on 04/28/2012. Surgery has been following the patient since arrival. He is currently on parenteral nutrition and his NG tube has recently been discontinued and he is tolerating ice chips without difficulty. He was deemed appropriate to transfer to the step down unit on 04/30/2012. [facility] team 1 accepted care of this patient on 05/01/2012.   Consultants:  Dr Harriette Bouillon, surgeon.  Dr Faith Rogue, physical Medicine and rehab  Procedures:  Serial CXRs.   Antibiotics:  Azithromycin 04/24/12 only.  Rocephin 04/24/12-04/25/12.  Levaquin 04/27/12-05/03/12.  Zosyn 04/27/12-04/28/12.  Vancomycin 04/27/12-04/29/12.  HPI/Subjective: No new issues.   Objective: Vital signs in last 24 hours: Temp:  [98.3 F (36.8 C)-99.1 F (37.3 C)] 99 F (37.2 C) (08/30 0640) Pulse Rate:  [61-66] 62  (08/30 0640) Resp:  [15-18] 18  (08/30 0640) BP: (159-183)/(72-85) 183/76 mmHg (08/30 0640) SpO2:  [96 %-100 %] 96 % (08/30 0640) Weight:  [82.4 kg (181 lb 10.5 oz)] 82.4 kg (181 lb 10.5 oz) (08/30 0640) Weight change: -0.3 kg (-10.6 oz) Last BM Date: 05/02/12  Intake/Output from previous day: 08/29 0701 - 08/30 0700 In: 2422.1 [P.O.:620; TPN:1802.1] Out: 1175 [Urine:1175]     Physical Exam: General: Comfortable, alert, communicative, fully oriented, not short of breath at rest.  HEENT:  Mild clinical pallor, no jaundice, no conjunctival injection or discharge. NECK:  Supple, JVP not seen, no carotid bruits, no palpable lymphadenopathy, no palpable goiter. CHEST:  Clinically clear to auscultation, no wheezes, no crackles. HEART:  Sounds 1 and 2 heard, normal, regular, no murmurs. ABDOMEN:  Full, soft, non-tender, no palpable organomegaly, no palpable masses, normal bowel sounds. Surgical wound is healing well.    GENITALIA:  Not examined. UPPER EXTREMITIES: LUE appears swollen.  LOWER EXTREMITIES:  Mild pitting edema, palpable peripheral pulses. MUSCULOSKELETAL SYSTEM:  Generalized osteoarthritic changes, otherwise, normal. CENTRAL NERVOUS SYSTEM:  No focal neurologic deficit on gross examination.  Lab Results:  Basename 05/03/12 0500  WBC 10.8*  HGB 10.7*  HCT 32.5*  PLT 255    Basename 05/03/12 0500 05/02/12 0625  NA 134* 138  K 3.6 3.3*  CL 100 101  CO2 29 29  GLUCOSE 180* 132*  BUN 21 29*  CREATININE 1.06 1.14  CALCIUM 7.9* 8.2*   Recent Results (from the past 240 hour(s))  SURGICAL PCR SCREEN     Status: Abnormal   Collection Time   04/25/12  7:59 PM      Component Value Range Status Comment   MRSA, PCR NEGATIVE  NEGATIVE Final    Staphylococcus aureus POSITIVE (*) NEGATIVE Final   CULTURE, BLOOD (ROUTINE X 2)     Status: Normal   Collection Time   04/27/12  2:50 PM      Component Value Range Status Comment   Specimen Description BLOOD RIGHT ARM   Final    Special Requests BOTTLES DRAWN AEROBIC AND ANAEROBIC 10CC   Final    Culture  Setup Time 04/27/2012 20:04   Final    Culture NO GROWTH 5 DAYS   Final    Report Status 05/03/2012 FINAL   Final   CULTURE, BLOOD (ROUTINE X 2)     Status: Normal   Collection Time   04/27/12  2:52 PM      Component Value Range Status Comment   Specimen Description BLOOD LEFT ARM   Final    Special Requests BOTTLES DRAWN AEROBIC AND ANAEROBIC 10CC   Final    Culture  Setup Time 04/27/2012 20:04  Final    Culture NO GROWTH 5 DAYS   Final    Report Status 05/03/2012 FINAL   Final   MRSA PCR SCREENING     Status: Normal   Collection Time   04/27/12  3:07 PM      Component Value Range Status Comment   MRSA by PCR NEGATIVE  NEGATIVE Final   CULTURE, RESPIRATORY     Status: Normal   Collection Time   04/27/12  3:44 PM      Component Value Range Status Comment   Specimen Description TRACHEAL ASPIRATE   Final    Special Requests NONE   Final     Gram Stain     Final    Value: MODERATE WBC PRESENT, PREDOMINANTLY PMN     NO SQUAMOUS EPITHELIAL CELLS SEEN     NO ORGANISMS SEEN   Culture NO GROWTH 2 DAYS   Final    Report Status 04/30/2012 FINAL   Final   CULTURE, RESPIRATORY     Status: Normal   Collection Time   04/28/12  5:08 AM      Component Value Range Status Comment   Specimen Description TRACHEAL ASPIRATE   Final    Special Requests NONE   Final    Gram Stain     Final    Value: ABUNDANT WBC PRESENT, PREDOMINANTLY PMN     RARE SQUAMOUS EPITHELIAL CELLS PRESENT     NO ORGANISMS SEEN   Culture NO GROWTH 2 DAYS   Final    Report Status 04/30/2012 FINAL   Final   URINE CULTURE     Status: Normal   Collection Time   05/01/12  5:08 PM      Component Value Range Status Comment   Specimen Description URINE, CATHETERIZED   Final    Special Requests NONE   Final    Culture  Setup Time 05/01/2012 17:27   Final    Colony Count NO GROWTH   Final    Culture NO GROWTH   Final    Report Status 05/02/2012 FINAL   Final      Studies/Results: No results found.  Medications: Scheduled Meds:    . antiseptic oral rinse  15 mL Mouth Rinse q12n4p  . chlorhexidine  15 mL Mouth Rinse BID  . heparin  5,000 Units Subcutaneous Q8H  . insulin aspart  0-15 Units Subcutaneous Q4H  . levofloxacin (LEVAQUIN) IV  750 mg Intravenous Q48H  . levothyroxine  125 mcg Oral QAC breakfast  . lisinopril  20 mg Oral Daily  . metoCLOPramide  5 mg Oral TID AC & HS  . pantoprazole  40 mg Oral Q0600  . potassium chloride  10 mEq Intravenous Q1 Hr x 4  . sodium chloride  10-40 mL Intracatheter Q12H  . DISCONTD: antiseptic oral rinse  15 mL Mouth Rinse QID  . DISCONTD: chlorhexidine  15 mL Mouth Rinse BID  . DISCONTD: levothyroxine  62.5 mcg Intravenous Daily  . DISCONTD: metoCLOPramide (REGLAN) injection  5 mg Intravenous Q6H  . DISCONTD: pantoprazole (PROTONIX) IV  40 mg Intravenous QAC breakfast   Continuous Infusions:    . fat emulsion 240  mL (05/01/12 1900)  . TPN (CLINIMIX) +/- additives 75 mL/hr at 05/01/12 1900  . TPN (CLINIMIX) +/- additives    . DISCONTD: TPN (CLINIMIX) +/- additives 80 mL/hr at 05/02/12 1737   PRN Meds:.cloNIDine, ondansetron, sodium chloride    LOS: 11 days   Amori Cooperman,CHRISTOPHER  [facility] Pager [phone]. If 8PM-8AM,  please contact night-coverage at [email or web address], password [facility] 05/03/2012, 12:11 PM  LOS: 11 days

## 2012-05-03 NOTE — Progress Notes (Signed)
Weaning off TNA.   Advance diet Ileus resolving.  Wilmon Arms. Corliss Skains, MD, [facility]  05/03/2012 4:13 PM

## 2012-05-03 NOTE — Progress Notes (Signed)
  Comment:  Vascular dopplers completed. Reveals acute DVT of the left radial and ulnar veins. Limited visibility in the subclavian area due to IV.  Plan: 1. Remove left subclavian central line. 2. Start therapeutic Lovenox/Coumadin. 3. Will need short term treatment, only, once central line is removed.  C. Joanny Dupree. MD, FACP.

## 2012-05-03 NOTE — Progress Notes (Signed)
Clinical Social Work  CSW spoke with dtr who toured [employer] and is agreeable to admission. CSW called SNF and left a message regarding patient choosing their facility. CSW was unable to obtain [facility] authorization today and will continue to work on authorization on Monday. MD reports patient should dc to SNF early next week.  [place], [place] [phone]

## 2012-05-03 NOTE — Progress Notes (Signed)
Clinical Social Work  CSW met with patient and family (sister and niece) in room to discuss SNF option. Patient prefers [facility] but wants dtr involved in decision. CSW spoke with dtr who reported that she has SNF options and is going to tour facilities this afternoon. CSW stressed the importance of making a decision in order to get authorization from insurance to prepare for dc. CSW will continue to follow.  [place], [place] [phone]

## 2012-05-03 NOTE — Progress Notes (Addendum)
ANTICOAGULATION CONSULT NOTE - Initial Consult  Pharmacy Consult:  Lovenox / Coumadin Indication: Acute DVT of left radial/ulnar veins  No Known Allergies  Patient Measurements: Height: 5\' 2"  (157.5 cm) Weight: 181 lb 10.5 oz (82.4 kg) IBW/kg (Calculated) : 54.6   Vital Signs: Temp: 98.1 F (36.7 C) (08/30 1338) Temp src: Axillary (08/30 1338) BP: 156/85 mmHg (08/30 1338) Pulse Rate: 70  (08/30 1338)  Labs:  Basename 05/03/12 0500 05/02/12 0625 05/01/12 0505  HGB 10.7* -- --  HCT 32.5* -- --  PLT 255 -- --  APTT -- -- --  LABPROT -- -- --  INR -- -- --  HEPARINUNFRC -- -- --  CREATININE 1.06 1.14 1.17  CKTOTAL -- -- --  CKMB -- -- --  TROPONINI -- -- --    Estimated Creatinine Clearance: 55.1 ml/min (by C-G formula based on Cr of 1.06).   Medical History: Past Medical History  Diagnosis Date  . Hypertension   . GERD (gastroesophageal reflux disease)   . Thyroid disease   . Upper GI bleed   . Ileus   . Mallory - Weiss tear   . Chronic anemia   . Enlarged prostate     Elevated PSA  . 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 Opal Sidles benign biopsies February 2005  . History of BPH     s/p TURP 2008     Assessment: 25 YOM with acute DVT of the left radial and ulnar veins to start anticoagulation with Lovenox and Coumadin.  CBC and renal function appropriate for Lovenox initiation.  INR at 1.24 on 04/27/12.   Goal of Therapy:  Anti-Xa level 0.6-1.2 units/ml 4hrs after LMWH dose given Monitor platelets by anticoagulation protocol: Yes INR 2 - 3   Plan:  - Lovenox 80mg  SQ Q12H - CBC Q72H while on Lovenox, daily PT / INR - Coumadin 7.5mg  PO today - Coumadin book / video     Leani Myron D. Laney Potash, PharmD, BCPS Pager:  [phone] 05/03/2012, 5:09 PM

## 2012-05-04 DIAGNOSIS — I82629 Acute embolism and thrombosis of deep veins of unspecified upper extremity: Secondary | ICD-10-CM

## 2012-05-04 LAB — BASIC METABOLIC PANEL
Calcium: 8.2 mg/dL — ABNORMAL LOW (ref 8.4–10.5)
GFR calc Af Amer: 65 mL/min — ABNORMAL LOW (ref >90)
GFR calc non Af Amer: 56 mL/min — ABNORMAL LOW (ref >90)
Potassium: 4 mEq/L (ref 3.5–5.1)
Sodium: 133 mEq/L — ABNORMAL LOW (ref 135–145)

## 2012-05-04 LAB — GLUCOSE, CAPILLARY
Glucose-Capillary: 112 mg/dL — ABNORMAL HIGH (ref 70–99)
Glucose-Capillary: 134 mg/dL — ABNORMAL HIGH (ref 70–99)

## 2012-05-04 LAB — PROTIME-INR: Prothrombin Time: 15.6 seconds — ABNORMAL HIGH (ref 11.6–15.2)

## 2012-05-04 MED ORDER — WARFARIN SODIUM 7.5 MG PO TABS
7.5000 mg | ORAL_TABLET | Freq: Once | ORAL | Status: AC
  Administered 2012-05-04: 7.5 mg via ORAL
  Filled 2012-05-04: qty 1

## 2012-05-04 NOTE — Progress Notes (Signed)
[facility] PROGRESS NOTE  JLEN WINTLE ZOX:[id number] DOB: 1933-12-31 DOA: 04/22/2012 PCP: Avon Gully, MD  Assessment/Plan: Principal Problem:  *Post-op Acute respiratory failure Active Problems:  Respiratory alkalosis  Aspiration pneumonia  Hypokalemia  Acute renal failure  S/P exploratory laparotomy  Adynamic ileus  CAP (community acquired pneumonia)  Protein calorie malnutrition  Fever  UTI (lower urinary tract infection)  Leukocytosis  Transaminitis  Hypothyroidism  Sepsis associated hypotension    1. Post-op Acute respiratory failure:  Patient was transferred from  [facility] with respiratory failure, on ventilator, and difficulty to wean. This was multifactorial, due to respiratory alkalosis, aspiration pneumonia vs CAP (community acquired pneumonia), as well as super-added effect of pain, hypoxemia a degree of volume overload. Patient is doing well clinically, respiratory failure has resolved, and he is saturating at 985-100% on room air. Continuous pulse oximetry was discontinued on 05/02/12. Antibiotic treatment concluded on 05/03/12. 2. Shock/? Sepsis associated hypotension:   This was addressed with antibiotics, and volume resuscitation, and has now resolved. Stress dose steroids have been weaned and finally discontinued on 05/01/12.  S/P exploratory laparotomy/ Adynamic ileus  Patient initially presented with clinical features of bowel obstruction, but was found to have adynamic ileus during surgery. Clinically, this has resolved with bowel rest, mobilization, and parenteral TNA. Patient has been commenced on oral fluids on 05/02/12 and diet advanced to full liquids today. As oral intake improved, TNA was discontinued on 05/03/12. Patient is now on low residue diet. Per surgical team, should be stable for DC in 24-48 hours.  3. Acute renal failure:   Patient's creatinine reached a peak of 1.63 during hospitalization, against a known baseline of 0.93, consistent with  acute kidney injury. As of 05/03/12, creatinine is within normal limits, at 1.06. AKI has resolved. 4. Leukocytosis/Fever/UTI (lower urinary tract infection):   White cell count had initially trended upward, associated with a low-grade fever. Leukocytosis could primarily be related to steroids, but urinalysis on 04/27/2012 appeared to be consistent with a urinary tract infection. Repeat urinalysis and culture was ordered on 05/01/12 and remained negative. Levaquin therapy should prove adequate, and as described above, has been concluded on 05/03/12. Wcc has normalized.  5. Hypokalemia:   Repleted as indicated. Resolved.   6. Protein calorie malnutrition:   Patient had been NPO for a significant amount of time, and is also hypoalbuminemic, consistent with moderate protein-calorie malnutrition. Managed with parenteral nutrition initially, weaned off on 05/03/12, now on low residue diet. Dietary recommendations have been requested. . 7. Transaminitis  Likely secondary to parenteral nutrition: This is mild, and of no clinical consequence.   8. Hypothyroidism:   On thyroxine replacement therapy, albeit parenterally. Transitioned to oral formulation on 05/02/12. 9. Deconditioning: Patient is profoundly deconditioned by recent acute medical problems. He was been evaluated by rehab MD, and considered appropriate for CIR. Due to insurance issues, however, he will be discharged to ST-SNF, when stable. 10: LUE DVT: Patient  Was on 05/03/12, noted to have moderate LUE edema. As he had left subclavian central line at the time, venous doppler was ordered, and revealed acute DVT of the left radial and ulnar veins. Patient was commenced on therapeutic Lovenox/Coumadin on that date, and central line, removed. We plan to do a repeat venous doppler on 05/06/12 or 05/07/12. Likely, anticoagulation will be only short term.    Code Status: Full Code. Family Communication:  Disposition Plan: Aiming SNF on 05/07/12.   Brief  narrative: 78 year old male patient [facility] on [facility]2012 with  abdominal distention felt to be related to small bowel obstruction. He was subsequently taken to the operating room where he underwent exploratory laparotomy but no abnormal abdominal pathology found except for adynamic ileus. Preoperatively patient had been previously diagnosed with community acquired pneumonia versus aspiration pneumonia. On 04/27/2012 they were unable to wean him from the ventilator and therefore he was transferred to [facility] for further evaluation. He was accepted by pulmonary critical care medicine. He was subsequently extubated on 04/28/2012. Surgery has been following the patient since arrival. He is currently on parenteral nutrition and his NG tube has recently been discontinued and he is tolerating ice chips without difficulty. He was deemed appropriate to transfer to the step down unit on 04/30/2012. [facility] accepted care of this patient on 05/01/2012.   Consultants:  Dr Harriette Bouillon, surgeon.  Dr Faith Rogue, physical Medicine and rehab  Procedures:  Serial CXRs.   Antibiotics:  Azithromycin 04/24/12 only.  Rocephin 04/24/12-04/25/12.  Levaquin 04/27/12-05/03/12.  Zosyn 04/27/12-04/28/12.  Vancomycin 04/27/12-04/29/12.  HPI/Subjective: No new issues.   Objective: Vital signs in last 24 hours: Temp:  [98.1 F (36.7 C)-99 F (37.2 C)] 98.6 F (37 C) (08/31 0526) Pulse Rate:  [58-72] 65  (08/31 0526) Resp:  [16-18] 16  (08/31 0526) BP: (142-156)/(72-104) 144/76 mmHg (08/31 0526) SpO2:  [98 %-100 %] 100 % (08/31 0526) Weight:  [82.1 kg (181 lb)] 82.1 kg (181 lb) (08/31 0526) Weight change: -0.3 kg (-10.6 oz) Last BM Date: 05/03/12  Intake/Output from previous day: 08/30 0701 - 08/31 0700 In: 600 [P.O.:600] Out: 1225 [Urine:1225]     Physical Exam: General: Comfortable, alert, communicative, fully oriented, not short of breath at rest.    HEENT:  Mild clinical pallor, no jaundice, no conjunctival injection or discharge. NECK:  Supple, JVP not seen, no carotid bruits, no palpable lymphadenopathy, no palpable goiter. CHEST:  Clinically clear to auscultation, no wheezes, no crackles. HEART:  Sounds 1 and 2 heard, normal, regular, no murmurs. ABDOMEN:  Full, soft, non-tender, no palpable organomegaly, no palpable masses, normal bowel sounds. Surgical wound is healing well.  GENITALIA:  Not examined. UPPER EXTREMITIES: LUE appears less swollen.  LOWER EXTREMITIES:  Mild pitting edema, palpable peripheral pulses. MUSCULOSKELETAL SYSTEM:  Generalized osteoarthritic changes, otherwise, normal. CENTRAL NERVOUS SYSTEM:  No focal neurologic deficit on gross examination.  Lab Results:  Basename 05/03/12 0500  WBC 10.8*  HGB 10.7*  HCT 32.5*  PLT 255    Basename 05/04/12 0550 05/03/12 0500  NA 133* 134*  K 4.0 3.6  CL 99 100  CO2 25 29  GLUCOSE 103* 180*  BUN 18 21  CREATININE 1.22 1.06  CALCIUM 8.2* 7.9*   Recent Results (from the past 240 hour(s))  SURGICAL PCR SCREEN     Status: Abnormal   Collection Time   04/25/12  7:59 PM      Component Value Range Status Comment   MRSA, PCR NEGATIVE  NEGATIVE Final    Staphylococcus aureus POSITIVE (*) NEGATIVE Final   CULTURE, BLOOD (ROUTINE X 2)     Status: Normal   Collection Time   04/27/12  2:50 PM      Component Value Range Status Comment   Specimen Description BLOOD RIGHT ARM   Final    Special Requests BOTTLES DRAWN AEROBIC AND ANAEROBIC 10CC   Final    Culture  Setup Time 04/27/2012 20:04   Final    Culture NO GROWTH 5 DAYS   Final    Report  Status 05/03/2012 FINAL   Final   CULTURE, BLOOD (ROUTINE X 2)     Status: Normal   Collection Time   04/27/12  2:52 PM      Component Value Range Status Comment   Specimen Description BLOOD LEFT ARM   Final    Special Requests BOTTLES DRAWN AEROBIC AND ANAEROBIC 10CC   Final    Culture  Setup Time 04/27/2012 20:04   Final     Culture NO GROWTH 5 DAYS   Final    Report Status 05/03/2012 FINAL   Final   MRSA PCR SCREENING     Status: Normal   Collection Time   04/27/12  3:07 PM      Component Value Range Status Comment   MRSA by PCR NEGATIVE  NEGATIVE Final   CULTURE, RESPIRATORY     Status: Normal   Collection Time   04/27/12  3:44 PM      Component Value Range Status Comment   Specimen Description TRACHEAL ASPIRATE   Final    Special Requests NONE   Final    Gram Stain     Final    Value: MODERATE WBC PRESENT, PREDOMINANTLY PMN     NO SQUAMOUS EPITHELIAL CELLS SEEN     NO ORGANISMS SEEN   Culture NO GROWTH 2 DAYS   Final    Report Status 04/30/2012 FINAL   Final   CULTURE, RESPIRATORY     Status: Normal   Collection Time   04/28/12  5:08 AM      Component Value Range Status Comment   Specimen Description TRACHEAL ASPIRATE   Final    Special Requests NONE   Final    Gram Stain     Final    Value: ABUNDANT WBC PRESENT, PREDOMINANTLY PMN     RARE SQUAMOUS EPITHELIAL CELLS PRESENT     NO ORGANISMS SEEN   Culture NO GROWTH 2 DAYS   Final    Report Status 04/30/2012 FINAL   Final   URINE CULTURE     Status: Normal   Collection Time   05/01/12  5:08 PM      Component Value Range Status Comment   Specimen Description URINE, CATHETERIZED   Final    Special Requests NONE   Final    Culture  Setup Time 05/01/2012 17:27   Final    Colony Count NO GROWTH   Final    Culture NO GROWTH   Final    Report Status 05/02/2012 FINAL   Final      Studies/Results: No results found.  Medications: Scheduled Meds:    . antiseptic oral rinse  15 mL Mouth Rinse q12n4p  . chlorhexidine  15 mL Mouth Rinse BID  . enoxaparin (LOVENOX) injection  80 mg Subcutaneous Q12H  . hydrochlorothiazide  25 mg Oral Daily  . insulin aspart  0-15 Units Subcutaneous TID AC & HS  . levofloxacin (LEVAQUIN) IV  750 mg Intravenous Q48H  . levothyroxine  125 mcg Oral QAC breakfast  . lisinopril  20 mg Oral Daily  . metoCLOPramide  5 mg  Oral TID AC & HS  . pantoprazole  40 mg Oral Q0600  . patient's guide to using coumadin book   Does not apply Once  . sodium chloride  10-40 mL Intracatheter Q12H  . Tamsulosin HCl  0.4 mg Oral QPC supper  . warfarin  7.5 mg Oral ONCE-1800  . warfarin   Does not apply Once  . Warfarin - Pharmacist Dosing  Inpatient   Does not apply q1800  . DISCONTD: heparin  5,000 Units Subcutaneous Q8H  . DISCONTD: insulin aspart  0-15 Units Subcutaneous Q4H   Continuous Infusions:    . TPN (CLINIMIX) +/- additives     PRN Meds:.cloNIDine, ondansetron, sodium chloride    LOS: 12 days   Gracelin Weisberg,CHRISTOPHER  [facility] Pager [phone]. If 8PM-8AM, please contact night-coverage at [email or web address], password [facility] 05/04/2012, 11:01 AM  LOS: 12 days

## 2012-05-04 NOTE — Progress Notes (Signed)
ANTICOAGULATION CONSULT NOTE - Follow Up Consult  Pharmacy Consult for Lovenox and Coumadin Indication: Acute DVT of left radial/ulnar veins  No Known Allergies  Patient Measurements: Height: 5\' 2"  (157.5 cm) Weight: 181 lb (82.1 kg) IBW/kg (Calculated) : 54.6  Heparin Dosing Weight:   Vital Signs: Temp: 99.2 F (37.3 C) (08/31 1859) Temp src: Oral (08/31 1859) BP: 131/67 mmHg (08/31 1859) Pulse Rate: 71  (08/31 1859)  Labs:  Basename 05/04/12 1900 05/04/12 0550 05/03/12 0500 05/02/12 0625  HGB -- -- 10.7* --  HCT -- -- 32.5* --  PLT -- -- 255 --  APTT -- -- -- --  LABPROT 15.6* -- -- --  INR 1.21 -- -- --  HEPARINUNFRC -- -- -- --  CREATININE -- 1.22 1.06 1.14  CKTOTAL -- -- -- --  CKMB -- -- -- --  TROPONINI -- -- -- --    Estimated Creatinine Clearance: 47.8 ml/min (by C-G formula based on Cr of 1.22).   Medications:  Lovenox 80mg  SQ q12h  Assessment: 78yom on Lovenox bridging to Coumadin (Day 2 of minimum 5 Day overlap) for acute DVT of left radial and ulnar veins. INR (1.21) is subtherapeutic as expected after Coumadin 7.5mg  x 1 - will repeat dose.  - No CBC this AM - No significant bleeding reported  Goal of Therapy:  INR 2-3 Anti-Xa level 0.6-1.2 units/ml 4hrs after LMWH dose given Monitor platelets by anticoagulation protocol: Yes   Plan:  1. Repeat Coumadin 7.5mg  po x 1 today 2. Continue Lovenox 80mg  SQ q12h 3. Follow-up AM INR  Cleon Dew [phone] 05/04/2012,7:47 PM

## 2012-05-04 NOTE — Progress Notes (Signed)
8 Days Post-Op  Subjective: Alert. Comfortable. Tolerating full liquids without nausea. Passing lots of flatus. Small stools. No cramps. No pain. TNA discontinued. The central line out.  Morning laboratory pending.  Objective: Vital signs in last 24 hours: Temp:  [98.1 F (36.7 C)-99 F (37.2 C)] 98.6 F (37 C) (08/31 0526) Pulse Rate:  [58-72] 65  (08/31 0526) Resp:  [16-18] 16  (08/31 0526) BP: (142-183)/(72-104) 144/76 mmHg (08/31 0526) SpO2:  [96 %-100 %] 100 % (08/31 0526) Weight:  [181 lb 10.5 oz (82.4 kg)] 181 lb 10.5 oz (82.4 kg) (08/30 0640) Last BM Date: 05/03/12  Intake/Output from previous day: 08/30 0701 - 08/31 0700 In: 600 [P.O.:600] Out: 1225 [Urine:1225] Intake/Output this shift: Total I/O In: -  Out: 950 [Urine:950]  General appearance: alert. Cooperative. In no distress. Mental status normal. Abdomen: Soft. Minimally tender. Active bowel sounds. Wound clean. Slightly distended.  Lab Results:  Results for orders placed during the hospital encounter of 04/22/12 (from the past 24 hour(s))  GLUCOSE, CAPILLARY     Status: Abnormal   Collection Time   05/03/12  7:45 AM      Component Value Range   Glucose-Capillary 162 (*) 70 - 99 mg/dL   Comment 1 Notify RN    GLUCOSE, CAPILLARY     Status: Abnormal   Collection Time   05/03/12 12:05 PM      Component Value Range   Glucose-Capillary 109 (*) 70 - 99 mg/dL   Comment 1 Notify RN    GLUCOSE, CAPILLARY     Status: Abnormal   Collection Time   05/03/12  5:01 PM      Component Value Range   Glucose-Capillary 123 (*) 70 - 99 mg/dL  GLUCOSE, CAPILLARY     Status: Abnormal   Collection Time   05/03/12  7:36 PM      Component Value Range   Glucose-Capillary 129 (*) 70 - 99 mg/dL  GLUCOSE, CAPILLARY     Status: Abnormal   Collection Time   05/03/12 10:26 PM      Component Value Range   Glucose-Capillary 112 (*) 70 - 99 mg/dL     Studies/Results: @RISRSLT24 @     . antiseptic oral rinse  15 mL Mouth  Rinse q12n4p  . chlorhexidine  15 mL Mouth Rinse BID  . enoxaparin (LOVENOX) injection  80 mg Subcutaneous Q12H  . hydrochlorothiazide  25 mg Oral Daily  . insulin aspart  0-15 Units Subcutaneous TID AC & HS  . levofloxacin (LEVAQUIN) IV  750 mg Intravenous Q48H  . levothyroxine  125 mcg Oral QAC breakfast  . lisinopril  20 mg Oral Daily  . metoCLOPramide  5 mg Oral TID AC & HS  . pantoprazole  40 mg Oral Q0600  . patient's guide to using coumadin book   Does not apply Once  . sodium chloride  10-40 mL Intracatheter Q12H  . Tamsulosin HCl  0.4 mg Oral QPC supper  . warfarin  7.5 mg Oral ONCE-1800  . warfarin   Does not apply Once  . Warfarin - Pharmacist Dosing Inpatient   Does not apply q1800  . DISCONTD: heparin  5,000 Units Subcutaneous Q8H  . DISCONTD: insulin aspart  0-15 Units Subcutaneous Q4H  . DISCONTD: levothyroxine  62.5 mcg Intravenous Daily  . DISCONTD: metoCLOPramide (REGLAN) injection  5 mg Intravenous Q6H  . DISCONTD: pantoprazole (PROTONIX) IV  40 mg Intravenous QAC breakfast     Assessment/Plan: s/p Procedure(s): EXPLORATORY LAPAROTOMY INSERTION CENTRAL LINE ADULT  POD #8. Exploratory laparotomy in [place]. Transferred for management of postop VDRF, now resolved. Postop ileus resolving. Advanced to low-residue diet. Leave staples in place. Surgically, it is possible that he may be ready for discharge in 24-48 hours. This will depend on normalization of GI function.    LOS: 12 days    Victor Hunter M 05/04/2012  . .prob

## 2012-05-05 LAB — PROTIME-INR: Prothrombin Time: 17.1 seconds — ABNORMAL HIGH (ref 11.6–15.2)

## 2012-05-05 MED ORDER — WARFARIN SODIUM 10 MG PO TABS
10.0000 mg | ORAL_TABLET | Freq: Once | ORAL | Status: AC
  Administered 2012-05-05: 10 mg via ORAL
  Filled 2012-05-05: qty 1

## 2012-05-05 MED ORDER — WARFARIN SODIUM 10 MG PO TABS
10.0000 mg | ORAL_TABLET | Freq: Once | ORAL | Status: DC
  Filled 2012-05-05: qty 1

## 2012-05-05 MED ORDER — POLYETHYLENE GLYCOL 3350 17 G PO PACK
17.0000 g | PACK | Freq: Every day | ORAL | Status: DC
  Administered 2012-05-05 – 2012-05-07 (×3): 17 g via ORAL
  Filled 2012-05-05 (×3): qty 1

## 2012-05-05 NOTE — Progress Notes (Signed)
ANTICOAGULATION CONSULT NOTE - Follow Up Consult  Pharmacy Consult for Lovenox and Coumadin Indication: Acute DVT of left radial/ulnar veins  No Known Allergies  Patient Measurements: Height: 5\' 2"  (157.5 cm) Weight: 182 lb 6.4 oz (82.736 kg) IBW/kg (Calculated) : 54.6   Vital Signs: Temp: 98.4 F (36.9 C) (09/01 1345) Temp src: Oral (09/01 1345) BP: 126/67 mmHg (09/01 1345) Pulse Rate: 69  (09/01 1345)  Labs:  Basename 05/05/12 1456 05/04/12 1900 05/04/12 0550 05/03/12 0500  HGB -- -- -- 10.7*  HCT -- -- -- 32.5*  PLT -- -- -- 255  APTT -- -- -- --  LABPROT 17.1* 15.6* -- --  INR 1.37 1.21 -- --  HEPARINUNFRC -- -- -- --  CREATININE -- -- 1.22 1.06  CKTOTAL -- -- -- --  CKMB -- -- -- --  TROPONINI -- -- -- --    Estimated Creatinine Clearance: 47.9 ml/min (by C-G formula based on Cr of 1.22).   Medications:  Lovenox 80mg  SQ q12h  Assessment: 78yom on Lovenox bridging to Coumadin (Day 3 of minimum 5 Day overlap) for acute DVT of left radial and ulnar veins. INR (1.37) remains subtherapeutic with some movement on Coumadin 7.5mg .  - No CBC this AM - No significant bleeding reported  Goal of Therapy:  INR 2-3 Anti-Xa level 0.6-1.2 units/ml 4hrs after LMWH dose given Monitor platelets by anticoagulation protocol: Yes   Plan:  1. Coumadin 10mg  po x 1 today 2. Continue Lovenox 80mg  SQ q12h 3. Follow-up AM INR  Christoper Fabian, PharmD, BCPS Clinical pharmacist, pager [phone] 05/05/2012,5:01 PM

## 2012-05-05 NOTE — Progress Notes (Addendum)
9 Days Post-Op  Subjective: Tolerating regular diet without nausea or cramps. Passing lots of flatus.had a stool, but they about how much.  On Coumadin for left upper extremity DVT, possibly related to left subclavian line which has been removed.  Objective: Vital signs in last 24 hours: Temp:  [98 F (36.7 C)-99.2 F (37.3 C)] 98.2 F (36.8 C) (09/01 0540) Pulse Rate:  [58-71] 58  (09/01 0540) Resp:  [17-18] 17  (09/01 0540) BP: (119-137)/(65-70) 137/68 mmHg (09/01 0540) SpO2:  [97 %-100 %] 98 % (09/01 0540) Weight:  [182 lb 6.4 oz (82.736 kg)] 182 lb 6.4 oz (82.736 kg) (09/01 0540) Last BM Date: 05/04/12  Intake/Output from previous day: 08/31 0701 - 09/01 0700 In: 720 [P.O.:720] Out: 300 [Urine:300] Intake/Output this shift:    General appearance: alert. Spirits good. Mental status normal. No distress. GI: abdomen soft. Bowel sounds present. Protuberant. Slightly tympanitic. Soft. Wound looks good. Staples in place.  Lab Results:  Results for orders placed during the hospital encounter of 04/22/12 (from the past 24 hour(s))  GLUCOSE, CAPILLARY     Status: Abnormal   Collection Time   05/04/12 11:21 AM      Component Value Range   Glucose-Capillary 121 (*) 70 - 99 mg/dL   Comment 1 Notify RN    GLUCOSE, CAPILLARY     Status: Abnormal   Collection Time   05/04/12  5:30 PM      Component Value Range   Glucose-Capillary 134 (*) 70 - 99 mg/dL   Comment 1 Notify RN    PROTIME-INR     Status: Abnormal   Collection Time   05/04/12  7:00 PM      Component Value Range   Prothrombin Time 15.6 (*) 11.6 - 15.2 seconds   INR 1.21  0.00 - 1.49  GLUCOSE, CAPILLARY     Status: Abnormal   Collection Time   05/04/12 10:06 PM      Component Value Range   Glucose-Capillary 134 (*) 70 - 99 mg/dL   Comment 1 Notify RN     Comment 2 Documented in Chart       Studies/Results: @RISRSLT24 @     . antiseptic oral rinse  15 mL Mouth Rinse q12n4p  . chlorhexidine  15 mL Mouth Rinse  BID  . enoxaparin (LOVENOX) injection  80 mg Subcutaneous Q12H  . hydrochlorothiazide  25 mg Oral Daily  . insulin aspart  0-15 Units Subcutaneous TID AC & HS  . levothyroxine  125 mcg Oral QAC breakfast  . lisinopril  20 mg Oral Daily  . metoCLOPramide  5 mg Oral TID AC & HS  . pantoprazole  40 mg Oral Q0600  . patient's guide to using coumadin book   Does not apply Once  . polyethylene glycol  17 g Oral Daily  . sodium chloride  10-40 mL Intracatheter Q12H  . Tamsulosin HCl  0.4 mg Oral QPC supper  . warfarin  7.5 mg Oral Once  . Warfarin - Pharmacist Dosing Inpatient   Does not apply q1800     Assessment/Plan: s/p Procedure(s): EXPLORATORY LAPAROTOMY INSERTION CENTRAL LINE ADULT  POD #9. Exploratory laparotomy for abdominal distention. Her original surgery by Dr. Leticia Penna in [place]. Ileus seems to be resolving. Continue regular diet. MiraLAX ordered to promote bowel function. Staples should be removed in the next 3 or 4 days. We will do if he is still here at that time. Otherwise they can be removed by his primary surgeon, Dr. Leticia Penna in  [place]. Basically ready for discharge from surgical standpoint.  Left upper extremity DVT. On Coumadin. Management per [facility] hospitalist.  Acute renal failure, basically resolved  Postoperative respiratory failure, resolved.       LOS: 13 days    Aarnav Steagall M 05/05/2012  . .prob

## 2012-05-05 NOTE — Progress Notes (Signed)
[facility] PROGRESS NOTE  Victor Hunter OZH:[id number] DOB: [birth year] DOA: 04/22/2012 PCP: Avon Gully, MD  Assessment/Plan: Principal Problem:  *Post-op Acute respiratory failure Active Problems:  Respiratory alkalosis  Aspiration pneumonia  Hypokalemia  Acute renal failure  S/P exploratory laparotomy  Adynamic ileus  CAP (community acquired pneumonia)  Protein calorie malnutrition  Fever  UTI (lower urinary tract infection)  Leukocytosis  Transaminitis  Hypothyroidism  Sepsis associated hypotension  DVT of upper extremity (deep vein thrombosis)    1. Post-op Acute respiratory failure:  Patient was transferred from  [facility] with respiratory failure, on ventilator, and difficulty to wean. This was multifactorial, due to respiratory alkalosis, aspiration pneumonia vs CAP (community acquired pneumonia), as well as super-added effect of pain, hypoxemia a degree of volume overload. Patient is doing well clinically, respiratory failure has resolved, and he is saturating at 985-100% on room air. Continuous pulse oximetry was discontinued on 05/02/12. Antibiotic treatment concluded on 05/03/12. 2. Shock/? Sepsis associated hypotension:   This was addressed with antibiotics, and volume resuscitation, and has now resolved. Stress dose steroids have been weaned and finally discontinued on 05/01/12.  S/P exploratory laparotomy/ Adynamic ileus  Patient initially presented with clinical features of bowel obstruction, but was found to have adynamic ileus during surgery. Clinically, this has resolved with bowel rest, mobilization, and parenteral TNA. Patient has been commenced on oral fluids on 05/02/12 and diet advanced to full liquids today. As oral intake improved, TNA was discontinued on 05/03/12. Patient is now on low residue diet. Per surgical team, stable for discharge.  3. Acute renal failure:   Patient's creatinine reached a peak of 1.63 during hospitalization, against a known baseline  of 0.93, consistent with acute kidney injury. As of 05/03/12, creatinine is within normal limits, at 1.06. AKI has resolved. 4. Leukocytosis/Fever/UTI (lower urinary tract infection):   White cell count had initially trended upward, associated with a low-grade fever. Leukocytosis could primarily be related to steroids, but urinalysis on 04/27/2012 appeared to be consistent with a urinary tract infection. Repeat urinalysis and culture was ordered on 05/01/12 and remained negative. Levaquin therapy should prove adequate, and as described above, has been concluded on 05/03/12. Wcc has normalized.  5. Hypokalemia:   Repleted as indicated. Resolved.   6. Protein calorie malnutrition:   Patient had been NPO for a significant amount of time, and is also hypoalbuminemic, consistent with moderate protein-calorie malnutrition. Managed with parenteral nutrition initially, weaned off on 05/03/12, now on low residue diet. Dietary recommendations have been requested.  7. Transaminitis  Likely secondary to parenteral nutrition: This is mild, and of no clinical consequence.   8. Hypothyroidism:   On thyroxine replacement therapy, albeit parenterally. Transitioned to oral formulation on 05/02/12. 9. Deconditioning: Patient is profoundly deconditioned by recent acute medical problems. He was been evaluated by rehab MD, and considered appropriate for CIR. Due to insurance issues, however, he will be discharged to [facility], when stable. 10: LUE DVT: Patient  Was on 05/03/12, noted to have moderate LUE edema. As he had left subclavian central line at the time, venous doppler was ordered, and revealed acute DVT of the left radial and ulnar veins. Patient was commenced on therapeutic Lovenox/Coumadin on that date, and central line, removed. We plan to do a repeat venous doppler on 05/06/12. Likely, anticoagulation will be only short term.    Code Status: Full Code. Family Communication:  Disposition Plan: Aiming SNF on 05/07/12 if  venous doppler is negative.   Brief narrative: 77 year old male patient initially  admitted [facility] on 04/22/2012 with abdominal distention felt to be related to small bowel obstruction. He was subsequently taken to the operating room where he underwent exploratory laparotomy but no abnormal abdominal pathology found except for adynamic ileus. Preoperatively patient had been previously diagnosed with community acquired pneumonia versus aspiration pneumonia. On 04/27/2012 they were unable to wean him from the ventilator and therefore he was transferred to [facility] for further evaluation. He was accepted by pulmonary critical care medicine. He was subsequently extubated on 04/28/2012. Surgery has been following the patient since arrival. He is currently on parenteral nutrition and his NG tube has recently been discontinued and he is tolerating ice chips without difficulty. He was deemed appropriate to transfer to the step down unit on 04/30/2012. [facility] team 1 accepted care of this patient on 05/01/2012.   Consultants:  Dr Harriette Bouillon, surgeon.  Dr Faith Rogue, physical Medicine and rehab  Procedures:  Serial CXRs.   Antibiotics:  Azithromycin 04/24/12 only.  Rocephin 04/24/12-04/25/12.  Levaquin 04/27/12-05/03/12.  Zosyn 04/27/12-04/28/12.  Vancomycin 04/27/12-04/29/12.  HPI/Subjective: No new issues.   Objective: Vital signs in last 24 hours: Temp:  [98 F (36.7 C)-99.2 F (37.3 C)] 98.2 F (36.8 C) (09/01 0540) Pulse Rate:  [58-71] 58  (09/01 0540) Resp:  [17-18] 17  (09/01 0540) BP: (119-137)/(65-70) 137/68 mmHg (09/01 0540) SpO2:  [97 %-100 %] 98 % (09/01 0540) Weight:  [82.736 kg (182 lb 6.4 oz)] 82.736 kg (182 lb 6.4 oz) (09/01 0540) Weight change: 0.636 kg (1 lb 6.4 oz) Last BM Date: 05/05/12  Intake/Output from previous day: 08/31 0701 - 09/01 0700 In: 720 [P.O.:720] Out: 500 [Urine:500]     Physical Exam: General: Comfortable, alert,  communicative, fully oriented, not short of breath at rest.  HEENT:  Mild clinical pallor, no jaundice, no conjunctival injection or discharge. NECK:  Supple, JVP not seen, no carotid bruits, no palpable lymphadenopathy, no palpable goiter. CHEST:  Clinically clear to auscultation, no wheezes, no crackles. HEART:  Sounds 1 and 2 heard, normal, regular, no murmurs. ABDOMEN:  Full, soft, non-tender, no palpable organomegaly, no palpable masses, normal bowel sounds. Surgical wound is healing well.  GENITALIA:  Not examined. UPPER EXTREMITIES: LUE appears less swollen.  LOWER EXTREMITIES:  Mild pitting edema, palpable peripheral pulses. MUSCULOSKELETAL SYSTEM:  Generalized osteoarthritic changes, otherwise, normal. CENTRAL NERVOUS SYSTEM:  No focal neurologic deficit on gross examination.  Lab Results:  Basename 05/03/12 0500  WBC 10.8*  HGB 10.7*  HCT 32.5*  PLT 255    Basename 05/04/12 0550 05/03/12 0500  NA 133* 134*  K 4.0 3.6  CL 99 100  CO2 25 29  GLUCOSE 103* 180*  BUN 18 21  CREATININE 1.22 1.06  CALCIUM 8.2* 7.9*   Recent Results (from the past 240 hour(s))  SURGICAL PCR SCREEN     Status: Abnormal   Collection Time   04/25/12  7:59 PM      Component Value Range Status Comment   MRSA, PCR NEGATIVE  NEGATIVE Final    Staphylococcus aureus POSITIVE (*) NEGATIVE Final   CULTURE, BLOOD (ROUTINE X 2)     Status: Normal   Collection Time   04/27/12  2:50 PM      Component Value Range Status Comment   Specimen Description BLOOD RIGHT ARM   Final    Special Requests BOTTLES DRAWN AEROBIC AND ANAEROBIC 10CC   Final    Culture  Setup Time 04/27/2012 20:04   Final    Culture  NO GROWTH 5 DAYS   Final    Report Status 05/03/2012 FINAL   Final   CULTURE, BLOOD (ROUTINE X 2)     Status: Normal   Collection Time   04/27/12  2:52 PM      Component Value Range Status Comment   Specimen Description BLOOD LEFT ARM   Final    Special Requests BOTTLES DRAWN AEROBIC AND ANAEROBIC 10CC    Final    Culture  Setup Time 04/27/2012 20:04   Final    Culture NO GROWTH 5 DAYS   Final    Report Status 05/03/2012 FINAL   Final   MRSA PCR SCREENING     Status: Normal   Collection Time   04/27/12  3:07 PM      Component Value Range Status Comment   MRSA by PCR NEGATIVE  NEGATIVE Final   CULTURE, RESPIRATORY     Status: Normal   Collection Time   04/27/12  3:44 PM      Component Value Range Status Comment   Specimen Description TRACHEAL ASPIRATE   Final    Special Requests NONE   Final    Gram Stain     Final    Value: MODERATE WBC PRESENT, PREDOMINANTLY PMN     NO SQUAMOUS EPITHELIAL CELLS SEEN     NO ORGANISMS SEEN   Culture NO GROWTH 2 DAYS   Final    Report Status 04/30/2012 FINAL   Final   CULTURE, RESPIRATORY     Status: Normal   Collection Time   04/28/12  5:08 AM      Component Value Range Status Comment   Specimen Description TRACHEAL ASPIRATE   Final    Special Requests NONE   Final    Gram Stain     Final    Value: ABUNDANT WBC PRESENT, PREDOMINANTLY PMN     RARE SQUAMOUS EPITHELIAL CELLS PRESENT     NO ORGANISMS SEEN   Culture NO GROWTH 2 DAYS   Final    Report Status 04/30/2012 FINAL   Final   URINE CULTURE     Status: Normal   Collection Time   05/01/12  5:08 PM      Component Value Range Status Comment   Specimen Description URINE, CATHETERIZED   Final    Special Requests NONE   Final    Culture  Setup Time 05/01/2012 17:27   Final    Colony Count NO GROWTH   Final    Culture NO GROWTH   Final    Report Status 05/02/2012 FINAL   Final      Studies/Results: No results found.  Medications: Scheduled Meds:    . antiseptic oral rinse  15 mL Mouth Rinse q12n4p  . chlorhexidine  15 mL Mouth Rinse BID  . enoxaparin (LOVENOX) injection  80 mg Subcutaneous Q12H  . hydrochlorothiazide  25 mg Oral Daily  . levothyroxine  125 mcg Oral QAC breakfast  . lisinopril  20 mg Oral Daily  . metoCLOPramide  5 mg Oral TID AC & HS  . pantoprazole  40 mg Oral Q0600    . polyethylene glycol  17 g Oral Daily  . sodium chloride  10-40 mL Intracatheter Q12H  . Tamsulosin HCl  0.4 mg Oral QPC supper  . warfarin  7.5 mg Oral Once  . Warfarin - Pharmacist Dosing Inpatient   Does not apply q1800  . DISCONTD: insulin aspart  0-15 Units Subcutaneous TID AC & HS   Continuous Infusions:  PRN Meds:.cloNIDine, ondansetron, DISCONTD: sodium chloride    LOS: 13 days   Ruthell Feigenbaum,CHRISTOPHER  [facility] Pager [phone]. If 8PM-8AM, please contact night-coverage at [email or web address], password [facility] 05/05/2012, 12:39 PM  LOS: 13 days

## 2012-05-06 DIAGNOSIS — Z86718 Personal history of other venous thrombosis and embolism: Secondary | ICD-10-CM

## 2012-05-06 LAB — PROTIME-INR: Prothrombin Time: 22.8 seconds — ABNORMAL HIGH (ref 11.6–15.2)

## 2012-05-06 LAB — CBC
MCH: 25.6 pg — ABNORMAL LOW (ref 26.0–34.0)
MCV: 77.2 fL — ABNORMAL LOW (ref 78.0–100.0)
Platelets: 268 10*3/uL (ref 150–400)
RDW: 15.9 % — ABNORMAL HIGH (ref 11.5–15.5)
WBC: 7.3 10*3/uL (ref 4.0–10.5)

## 2012-05-06 MED ORDER — WARFARIN SODIUM 7.5 MG PO TABS
7.5000 mg | ORAL_TABLET | Freq: Once | ORAL | Status: DC
  Filled 2012-05-06: qty 1

## 2012-05-06 MED ORDER — ENOXAPARIN SODIUM 40 MG/0.4ML ~~LOC~~ SOLN
40.0000 mg | SUBCUTANEOUS | Status: DC
  Filled 2012-05-06: qty 0.4

## 2012-05-06 NOTE — Progress Notes (Signed)
Clinical Social Work  CSW spoke with OT who reported that patient is doing well enough for [facility]. Patient and family agreeable to [facility] with family support. CSW text paged MD regarding change from SNF to [facility]. CSW is signing off.  [place], [place] [phone]

## 2012-05-06 NOTE — Progress Notes (Signed)
Physical Therapy Treatment Patient Details Name: Victor Hunter MRN: [id number] DOB: 05/13/1934 Today's Date: 05/06/2012 Time: 7846-9629 PT Time Calculation (min): 12 min  PT Assessment / Plan / Recommendation Comments on Treatment Session  Pt's mobility approaching premorbid status with pt ambulating without a RW and ascending/ descending a flight of stairs.  Pt will benefit from HHPT after d/c to strengthen the knees as well as improve balance for fall prevention.    Follow Up Recommendations  Home health PT    Barriers to Discharge        Equipment Recommendations  Rolling walker with 5" wheels;3 in 1 bedside comode    Recommendations for Other Services    Frequency Min 3X/week   Plan Discharge plan needs to be updated;Frequency remains appropriate    Precautions / Restrictions Precautions Precautions: Fall Required Braces or Orthoses: Other Brace/Splint Other Brace/Splint: abdominal binder Restrictions Weight Bearing Restrictions: No   Pertinent Vitals/Pain No c/o pain    Mobility  Bed Mobility Bed Mobility: Not assessed Transfers Transfers: Sit to Stand;Stand to Sit Sit to Stand: 6: Modified independent (Device/Increase time);From chair/3-in-1;With upper extremity assist Stand to Sit: 6: Modified independent (Device/Increase time);To chair/3-in-1;With upper extremity assist Details for Transfer Assistance: pt performing transfers safely and independently Ambulation/Gait Ambulation/Gait Assistance: 5: Supervision;4: Min guard Ambulation Distance (Feet): 300 Feet Assistive device: Rolling walker Ambulation/Gait Assistance Details: pt ambulated 150' with RW and supervision and 150' without AD and min-guard A. vc's for erect posture.  Discussed shoe wear as pt wearing slippers that seem hard to keep on, recommended that he wear supportive lace up shoes at home for fall prevention Gait Pattern: Step-through pattern;Trunk flexed Gait velocity: WFL Stairs: Yes Stairs  Assistance: 4: Min assist Stairs Assistance Details (indicate cue type and reason): pt does not have handrail so he ascended with no rail and HHA, used rail on descent and has difficulty with knee control with descending. Discussed strengthening in [facility] setting that can be done to improve this. Stair Management Technique: Forwards;One rail Right;Step to pattern (rail on descent only) Number of Stairs: 12  Wheelchair Mobility Wheelchair Mobility: No    Exercises     PT Diagnosis:    PT Problem List:   PT Treatment Interventions:     PT Goals Acute Rehab PT Goals PT Goal Formulation: With patient Time For Goal Achievement: 05/13/12 Potential to Achieve Goals: Good Pt will Roll Supine to Right Side: with modified independence Pt will Roll Supine to Left Side: with modified independence Pt will go Supine/Side to Sit: with modified independence Pt will go Sit to Supine/Side: with modified independence Pt will go Sit to Stand: with modified independence PT Goal: Sit to Stand - Progress: Met Pt will go Stand to Sit: with modified independence PT Goal: Stand to Sit - Progress: Met Pt will Ambulate: >150 feet;with modified independence;with rolling walker PT Goal: Ambulate - Progress: Progressing toward goal Pt will Go Up / Down Stairs: 3-5 stairs;with supervision;with rail(s) PT Goal: Up/Down Stairs - Progress: Progressing toward goal  Visit Information  Last PT Received On: 05/06/12 Assistance Needed: +1    Subjective Data  Subjective: I'm going home tomorrow Patient Stated Goal: return home   Cognition  Overall Cognitive Status: Appears within functional limits for tasks assessed/performed Arousal/Alertness: Awake/alert Orientation Level: Appears intact for tasks assessed Behavior During Session: [facility] for tasks performed    Balance  Balance Balance Assessed: Yes Dynamic Standing Balance Dynamic Standing - Balance Support: During functional activity;No upper extremity  supported  Dynamic Standing - Level of Assistance: 5: Stand by assistance Dynamic Standing - Balance Activities: Other (comment) (simulated shower) Dynamic Standing - Comments: pt careful to keep wt within BOS and minimizes wt-shift as compensation for mild balance impairment  End of Session PT - End of Session Equipment Utilized During Treatment: Gait belt;Other (comment) (abdominal binder) Activity Tolerance: Patient tolerated treatment well Patient left: in chair;with call bell/phone within reach;with family/visitor present Nurse Communication: Mobility status   GP   Victor Hunter, PT  Acute Rehab Services  [phone]   Victor Hunter 05/06/2012, 4:18 PM

## 2012-05-06 NOTE — Progress Notes (Signed)
*  PRELIMINARY RESULTS* Vascular Ultrasound Left upper extremity venous duplex has been completed.  Preliminary findings: No evidence of DVT in the left upper extremity. Superficial thrombosis involving the left cephalic vein at the antecubital fossa and proximal forearm.  Farrel Demark, RDMS, RVT  05/06/2012, 2:06 PM

## 2012-05-06 NOTE — Progress Notes (Signed)
10 Days Post-Op  Subjective: Doing well surgically. Tolerating regular diet. Passing flatus and stool. Minimal abdominal pain.  Objective: Vital signs in last 24 hours: Temp:  [98.4 F (36.9 C)-98.8 F (37.1 C)] 98.4 F (36.9 C) (09/02 0540) Pulse Rate:  [61-74] 61  (09/02 0540) Resp:  [17-19] 17  (09/02 0540) BP: (126-139)/(67-75) 131/73 mmHg (09/02 0540) SpO2:  [96 %-98 %] 96 % (09/02 0540) Last BM Date: 05/05/12  Intake/Output from previous day: 09/01 0701 - 09/02 0700 In: 240 [P.O.:240] Out: -  Intake/Output this shift:    General appearance: alert. Oriented. Mental status normal. Cannulating with walker. Poor memory for events in [place]. GI: abdomen protuberant, soft, nontender, not tympanitic. Midline wound clean. Staples in place. Tissues a little edematous but no drainage.  Lab Results:  Results for orders placed during the hospital encounter of 04/22/12 (from the past 24 hour(s))  PROTIME-INR     Status: Abnormal   Collection Time   05/05/12  2:56 PM      Component Value Range   Prothrombin Time 17.1 (*) 11.6 - 15.2 seconds   INR 1.37  0.00 - 1.49  CBC     Status: Abnormal   Collection Time   05/06/12  5:12 AM      Component Value Range   WBC 7.3  4.0 - 10.5 K/uL   RBC 4.38  4.22 - 5.81 MIL/uL   Hemoglobin 11.2 (*) 13.0 - 17.0 g/dL   HCT 16.1 (*) 09.6 - 04.5 %   MCV 77.2 (*) 78.0 - 100.0 fL   MCH 25.6 (*) 26.0 - 34.0 pg   MCHC 33.1  30.0 - 36.0 g/dL   RDW 40.9 (*) 81.1 - 91.4 %   Platelets 268  150 - 400 K/uL  PROTIME-INR     Status: Abnormal   Collection Time   05/06/12  5:12 AM      Component Value Range   Prothrombin Time 22.8 (*) 11.6 - 15.2 seconds   INR 1.97 (*) 0.00 - 1.49     Studies/Results: @RISRSLT24 @     . antiseptic oral rinse  15 mL Mouth Rinse q12n4p  . chlorhexidine  15 mL Mouth Rinse BID  . enoxaparin (LOVENOX) injection  80 mg Subcutaneous Q12H  . hydrochlorothiazide  25 mg Oral Daily  . levothyroxine  125 mcg Oral QAC  breakfast  . lisinopril  20 mg Oral Daily  . metoCLOPramide  5 mg Oral TID AC & HS  . pantoprazole  40 mg Oral Q0600  . polyethylene glycol  17 g Oral Daily  . sodium chloride  10-40 mL Intracatheter Q12H  . Tamsulosin HCl  0.4 mg Oral QPC supper  . warfarin  10 mg Oral ONCE-1800  . Warfarin - Pharmacist Dosing Inpatient   Does not apply q1800  . DISCONTD: insulin aspart  0-15 Units Subcutaneous TID AC & HS  . DISCONTD: warfarin  10 mg Oral ONCE-1800     Assessment/Plan: s/p Procedure(s): EXPLORATORY LAPAROTOMY INSERTION CENTRAL LINE ADULT  POD #10. Exploratory laparotomy for abdominal distention. His original surgery was by Dr. Leticia Penna in [place].   Ileus resolved.. Continue regular diet.   Staples should be removed in the next 3 or 4 days.he has been advised to make an appointment to see his surgeon,  Dr. Suzette Battiest on Thursday or Friday for wound check and staple removal. He states he will do this.   Follow up with [facility] PRN.  He needs all discharge criteria from surgical standpoint and may  be discharged when cleared medically.   Left upper extremity DVT. On Coumadin. Management per [facility] hospitalist.  Acute renal failure, basically resolved  Postoperative respiratory failure, resolved.     LOS: 14 days    Savaughn Karwowski M. Derrell Lolling, M.D., [facility] General and Minimally invasive Surgery Breast and Colorectal Surgery Office:   [phone] Pager:   [phone]  05/06/2012  . .prob

## 2012-05-06 NOTE — Progress Notes (Signed)
Clinical Social Work  CSW spoke with SNF ([facility]) who stated they could accept patient tomorrow as long as English as a second language teacher is received. SNF requested additional PT/OT notes in order to fax them to [facility]. CSW sent clinicals via [facility]. CSW will continue to follow.  [place], [place] [phone]

## 2012-05-06 NOTE — Progress Notes (Signed)
ANTICOAGULATION CONSULT NOTE - Follow Up Consult  Pharmacy Consult for Lovenox and Coumadin Indication: Acute DVT of left radial/ulnar veins  No Known Allergies  Patient Measurements: Height: 5\' 2"  (157.5 cm) Weight: 182 lb 6.4 oz (82.736 kg) IBW/kg (Calculated) : 54.6   Vital Signs: Temp: 98.4 F (36.9 C) (09/02 0540) Temp src: Oral (09/02 0540) BP: 131/73 mmHg (09/02 0540) Pulse Rate: 61  (09/02 0540)  Labs:  Basename 05/06/12 0512 05/05/12 1456 05/04/12 1900 05/04/12 0550  HGB 11.2* -- -- --  HCT 33.8* -- -- --  PLT 268 -- -- --  APTT -- -- -- --  LABPROT 22.8* 17.1* 15.6* --  INR 1.97* 1.37 1.21 --  HEPARINUNFRC -- -- -- --  CREATININE -- -- -- 1.22  CKTOTAL -- -- -- --  CKMB -- -- -- --  TROPONINI -- -- -- --    Estimated Creatinine Clearance: 47.9 ml/min (by C-G formula based on Cr of 1.22).   Medications:  Lovenox 80mg  SQ q12h  Assessment: Patient admitted to [facility] with epigastric pain, chest discomfort and abd distention. s/p exp lap (8/23), findings consistent with adynamic ileus, no obstruction found.   AC: 78yom on Lovenox bridging to Coumadin (Day 4 of minimum 5 Day overlap) for acute DVT of left radial and ulnar veins. INR up to 1.97 today. Hgb improved to 11.2. Last Scr up slightly to 1.22 (CrCl 48).  GI: TPN d/c 8/30. On low fiber diet. Continues on Reglan, po PPI  Endo: No prior hx of DM but A1c 6.5 this admit. No TSH check this admit, cont on home levothyroxine. Off steroids.  Cards: VSS. On home HCTZ, lisinopril  ID: Afeb, WBC down to 7.3. S/p 8-day Levaquin for PNA. Azithromycin 8/21 >>8/22 Ceftriaxone 8/21 >> 8/22 Cefoxitin 8/22 x 1 dose pre-op Vanc 8/24 >>8/26 Zosyn 8/24 >>8/26 Levaquin 8/24 >>8/31  8/22, 24: MRSA PCR neg  8/24: trach asp: Neg 8/25 trach asp: Neg 8/24: Blood x2: Neg  Best Practices: Enox/Coumadin, PPI po, mouth care  Goal of Therapy:  INR 2-3 Anti-Xa level 0.6-1.2 units/ml 4hrs after LMWH dose given Monitor  platelets by anticoagulation protocol: Yes   Plan:  Lovenox 80mg  SQ q12h  Coumadin 7.5mg  po x 1 tonight.   Evgenia Merriman S. Merilynn Finland, PharmD, BCPS Clinical Staff Pharmacist Pager [phone]  05/06/2012,1:36 PM

## 2012-05-06 NOTE — Progress Notes (Signed)
Occupational Therapy Treatment Patient Details Name: Victor Hunter MRN: [id number] DOB: 1934/02/10 Today's Date: 05/06/2012 Time: 8657-8469 OT Time Calculation (min): 16 min  OT Assessment / Plan / Recommendation Comments on Treatment Session Pt. is eager to go home, and would prefer this to rehab.  Pt. ambulating in hallway with dtr and reports he has done 2 full laps today so far, and 4 laps yesterday.  He is supervision - min guard assist with BADLs.  Family report they can provide close to 24 hour supervision/assist at home, and are comfortable with him discharging home rather than going SNF.  He will need 3-in-1 commode, and HHPT/OT    Follow Up Recommendations  Supervision/Assistance - 24 hour;Home health OT    Barriers to Discharge       Equipment Recommendations  3 in 1 bedside comode (Pt. had RW and shower seat)    Recommendations for Other Services    Frequency Min 2X/week   Plan Discharge plan needs to be updated    Precautions / Restrictions Precautions Precautions: Fall Other Brace/Splint: abdominal binder Restrictions Weight Bearing Restrictions: No   Pertinent Vitals/Pain     ADL  Grooming: Simulated;Wash/dry hands;Wash/dry face;Teeth care;Brushing hair;Min guard Where Assessed - Grooming: Supported standing Upper Body Bathing: Simulated;Set up Where Assessed - Upper Body Bathing: Supported standing Lower Body Bathing: Simulated;Min guard Where Assessed - Lower Body Bathing: Supported standing Upper Body Dressing: Simulated;Set up Where Assessed - Upper Body Dressing: Unsupported sitting Lower Body Dressing: Simulated;Performed;Supervision/safety Where Assessed - Lower Body Dressing: Supported sit to stand Toilet Transfer: Simulated;Min Pension scheme manager Method: Sit to Barista: Raised toilet seat with arms (or 3-in-1 over toilet) Toileting - Clothing Manipulation and Hygiene: Simulated;Supervision/safety Where Assessed -  Engineer, mining and Hygiene: Standing Tub/Shower Transfer: Simulated;Min guard Tub/Shower Transfer Method: Science writer: Walk in Scientist, research (physical sciences) Used: Rolling walker Transfers/Ambulation Related to ADLs: Pt. ambulating in hallway with dtr and min guard assist with RW ADL Comments: Pt. ambulated full circle.  He and dtr report this is second lap today, and he reports he did 4 laps yesterday.   Pt. is eager to discharge home.  Dtrs report that family is able to provide almost 24 hour assist at discharge.  Pt. simulated showering in standing with supervision to min guard assist.  He has shower seat.  Family reports they are comfortable with him discharging home instead of going to rehab.      OT Diagnosis:    OT Problem List:   OT Treatment Interventions:     OT Goals ADL Goals ADL Goal: Grooming - Progress: Met ADL Goal: Upper Body Bathing - Progress: Met ADL Goal: Lower Body Bathing - Progress: Met ADL Goal: Upper Body Dressing - Progress: Met ADL Goal: Lower Body Dressing - Progress: Met ADL Goal: Toilet Transfer - Progress: Progressing toward goals ADL Goal: Toileting - Clothing Manipulation - Progress: Met ADL Goal: Toileting - Hygiene - Progress: Met  Visit Information  Last OT Received On: 05/06/12 Assistance Needed: +1    Subjective Data      Prior Functioning       Cognition  Overall Cognitive Status: Appears within functional limits for tasks assessed/performed Arousal/Alertness: Awake/alert Orientation Level: Appears intact for tasks assessed Behavior During Session: [facility] for tasks performed    Mobility  Shoulder Instructions Bed Mobility Bed Mobility: Not assessed Transfers Transfers: Sit to Stand;Stand to Sit Sit to Stand: 5: Supervision;From chair/3-in-1;With upper extremity assist Stand to Sit: 5: Supervision;To  chair/3-in-1;With upper extremity assist       Exercises      Balance Balance Balance Assessed:  Yes Dynamic Standing Balance Dynamic Standing - Balance Support: During functional activity Dynamic Standing - Level of Assistance: 5: Stand by assistance Dynamic Standing - Balance Activities: Other (comment) (simulated shower)   End of Session OT - End of Session Activity Tolerance: Patient tolerated treatment well Patient left: in chair;with call bell/phone within reach;with family/visitor present  GO     Manroop Jakubowicz, Ursula Alert M 05/06/2012, 1:12 PM

## 2012-05-06 NOTE — Discharge Summary (Signed)
Physician Discharge Summary  Victor Hunter:[id number] DOB: Jan 30, 1934 DOA: 04/22/2012  PCP: Avon Gully, MD  Admit date: 04/22/2012 Discharge date: 05/07/2012  Recommendations for Outpatient Follow-up:  1. Follow up with primary MD.  2.   Patient has been advised to make an appointment to see his surgeon, Dr. Suzette Battiest on 05/09/12 or 05/10/12, for wound check.   Discharge Diagnoses:  Principal Problem:  *Post-op Acute respiratory failure Active Problems:  Respiratory alkalosis  Aspiration pneumonia  Hypokalemia  Acute renal failure  S/P exploratory laparotomy  Adynamic ileus  CAP (community acquired pneumonia)  Protein calorie malnutrition  Fever  UTI (lower urinary tract infection)  Leukocytosis  Transaminitis  Hypothyroidism  Sepsis associated hypotension  DVT of upper extremity (deep vein thrombosis)   Discharge Condition: Satisfactory.  Diet recommendation: Low fiber diet.  Filed Weights   05/04/12 0526 05/05/12 0540 05/07/12 0531  Weight: 82.1 kg (181 lb) 82.736 kg (182 lb 6.4 oz) 83.2 kg (183 lb 6.8 oz)    History of present illness:  78 year old male patient initially admitted [facility] on 04/22/2012 with abdominal distention felt to be related to small bowel obstruction. He was subsequently taken to the operating room where he underwent exploratory laparotomy but no abnormal abdominal pathology found except for adynamic ileus. Preoperatively patient had been previously diagnosed with community acquired pneumonia versus aspiration pneumonia. On 04/27/2012 they were unable to wean him from the ventilator and he was transferred to [facility] for further evaluation. He was accepted by pulmonary critical care and subsequently extubated on 04/28/2012. Surgery has been following the patient since arrival. He was managed with parenteral nutrition, NG tube was discontinued without deleterious effect. He was deemed appropriate to transfer to the step down unit on 04/30/2012.  [facility] accepted care of this patient on 05/01/2012.   Hospital Course:  1. Post-op Acute respiratory failure:  Patient was transferred from [facility] with respiratory failure, on ventilator, and difficulty to wean. This was multifactorial, due to respiratory alkalosis, aspiration pneumonia vs CAP (community acquired pneumonia), as well as super-added effect of pain, hypoxemia and a degree of volume overload. As of 05/02/12, patient was doing well clinically, respiratory failure had resolved, and he was saturating at 985-100% on room air. Continuous pulse oximetry was discontinued on that date. Antibiotic treatment was concluded on 05/03/12.  2. Shock/Query Sepsis-associated hypotension:  This was addressed with antibiotics, and volume resuscitation, with resolution. Stress dose steroids were weaned and finally discontinued on 05/01/12.  3. S/P exploratory laparotomy/Adynamic ileus:  Patient initially presented with clinical features of bowel obstruction, but was found to have adynamic ileus during surgery. Clinically, this has resolved with bowel rest, mobilization, and parenteral TNA. Patient was commenced on oral fluids on 05/02/12 and diet advanced to full liquids on 05/03/12. As oral intake improved, TNA was discontinued on 05/03/12. Patient is now on low residue diet. As of 05/05/12, he was considered stable for discahrge, by the surgical team.  4. Acute renal failure:  Patient's creatinine reached a peak of 1.63 during hospitalization, against a known baseline of 0.93, consistent with acute kidney injury. As of 05/03/12, creatinine is within normal limits, at 1.06. AKI has resolved.  5. Leukocytosis/Fever/UTI (lower urinary tract infection):  White cell count had initially trended upward, associated with a low-grade fever. Leukocytosis could primarily be related to steroids, but urinalysis on 04/27/2012 appeared to be consistent with a urinary tract infection. Repeat urinalysis and culture was  ordered on 05/01/12 and remained negative. Levaquin therapy was felt to  be adequate cover, and as described above, was concluded on 05/03/12. Wcc has normalized.  6. Hypokalemia:  Repleted as indicated. Resolved.  6. Protein calorie malnutrition:  Patient had been NPO for a significant amount of time, and is also hypoalbuminemic, consistent with moderate protein-calorie malnutrition. Managed with parenteral nutrition initially, weaned off on 05/03/12, now on low residue diet. Dietary recommendations have been obtained.  7. Transaminitis:  Likely secondary to parenteral nutrition: This was mild, and of no clinical consequence.  8. Hypothyroidism:  Continued on thyroxine replacement therapy, albeit initially parenterally. Transitioned to oral formulation on 05/02/12.  9. Deconditioning:  Patient is profoundly deconditioned by recent acute medical problems. He continues to make progress, and has been recommended HHPT/OT.  10: LUE DVT: Patient Was on 05/03/12, noted to have moderate LUE edema. As he had left subclavian central line at the time, venous doppler was ordered, and revealed acute DVT of the left radial and ulnar veins. Patient was commenced on therapeutic Lovenox/Coumadin on that date, and central line, removed. Repeat venous doppler on 05/06/12 confirmed resolution of DVT. Anticoagulation, was therefore, discontinued.    Procedures:  See below.   Consultations: Dr Harriette Bouillon, surgeon.  Dr Faith Rogue, physical Medicine and rehabilitation.  Discharge Exam: Filed Vitals:   05/07/12 0531  BP: 116/68  Pulse: 62  Temp: 98.1 F (36.7 C)  Resp: 18   Filed Vitals:   05/06/12 0540 05/06/12 1400 05/06/12 2215 05/07/12 0531  BP: 131/73 114/66 149/71 116/68  Pulse: 61 70 66 62  Temp: 98.4 F (36.9 C) 98.5 F (36.9 C) 99 F (37.2 C) 98.1 F (36.7 C)  TempSrc: Oral Oral Oral Oral  Resp: 17 18 18 18   Height:      Weight:    83.2 kg (183 lb 6.8 oz)  SpO2: 96% 99% 97% 100%     General: Comfortable, alert, communicative, fully oriented, not short of breath at rest.  HEENT: Mild clinical pallor, no jaundice, no conjunctival injection or discharge.  NECK: Supple, JVP not seen, no carotid bruits, no palpable lymphadenopathy, no palpable goiter.  CHEST: Clinically clear to auscultation, no wheezes, no crackles.  HEART: Sounds 1 and 2 heard, normal, regular, no murmurs.  ABDOMEN: Full, soft, non-tender, no palpable organomegaly, no palpable masses, normal bowel sounds. Surgical wound is healing well.  GENITALIA: Not examined.  UPPER EXTREMITIES: LUE swelling has resolved.   LOWER EXTREMITIES: Minimal pitting edema, palpable peripheral pulses.  MUSCULOSKELETAL SYSTEM: Generalized osteoarthritic changes, otherwise, normal.  CENTRAL NERVOUS SYSTEM: No focal neurologic deficit on gross examination.  Discharge Instructions  Discharge Orders    Future Orders Please Complete By Expires   Diet - low sodium heart healthy      Increase activity slowly        Medication List  As of 05/07/2012 12:10 PM   STOP taking these medications         diltiazem 180 MG 24 hr capsule         TAKE these medications         aspirin EC 81 MG tablet   Take 81 mg by mouth daily.      calcium carbonate 500 MG chewable tablet   Commonly known as: TUMS - dosed in mg elemental calcium   Chew 1 tablet by mouth daily as needed. Acid Reflux      hydrochlorothiazide 25 MG tablet   Commonly known as: HYDRODIURIL   Take 25 mg by mouth daily.      levothyroxine 125  MCG tablet   Commonly known as: SYNTHROID, LEVOTHROID   Take 125 mcg by mouth daily.      lisinopril 20 MG tablet   Commonly known as: PRINIVIL,ZESTRIL   Take 1 tablet (20 mg total) by mouth daily.      metoCLOPramide 5 MG tablet   Commonly known as: REGLAN   Take 1 tablet (5 mg total) by mouth 4 (four) times daily -  before meals and at bedtime.      pantoprazole 40 MG tablet   Commonly known as: PROTONIX   Take 1  tablet (40 mg total) by mouth daily at 6 (six) AM.      polyethylene glycol packet   Commonly known as: MIRALAX / GLYCOLAX   Take 17 g by mouth daily.      potassium chloride SA 20 MEQ tablet   Commonly known as: K-DUR,KLOR-CON   Take 20 mEq by mouth daily.      Tamsulosin HCl 0.4 MG Caps   Commonly known as: FLOMAX   Take 0.4 mg by mouth daily after supper.           Follow-up Information    Follow up with Fabio Bering, MD. Schedule an appointment as soon as possible for a visit in 3 days.   Contact information:   [address] [place] [address] [phone]       Call Avon Gully, MD. (As needed)    Contact information:   [address] [place] [address] [phone]           The results of significant diagnostics from this hospitalization (including imaging, microbiology, ancillary and laboratory) are listed below for reference.    Significant Diagnostic Studies: Dg Chest 2 View  04/22/2012  *RADIOLOGY REPORT*  Clinical Data: Chest pain, cold sweats, hypertension  CHEST - 2 VIEW  Comparison: 11/13/2008  Findings: Biconvex thoracolumbar scoliosis. Borderline enlargement of cardiac silhouette. Mediastinal contours and pulmonary vascularity normal. No definite infiltrate, pleural effusion, or pneumothorax. Question prior thyroidectomy. Mild peribronchial thickening. Surgical clips right upper quadrant question cholecystectomy.  IMPRESSION: Borderline enlargement of cardiac silhouette. Mild bronchitic changes.   Original Report Authenticated By: Lollie Marrow, M.D. ( 04/22/2012 10:02:45 )    Dg Abd 1 View  04/22/2012  *RADIOLOGY REPORT*  Clinical Data: Nausea, vomiting, abdominal pain and distension since yesterday.  ABDOMEN - 1 VIEW  Comparison: PET CT 09/16/2008.  CT abdomen and pelvis 09/09/2008.  Findings: Prominent gaseous distension of mid abdominal small bowel with nondistended partially stool filled colon.   Changes are consistent with small bowel obstruction.  No radiopaque stones. Surgical clips in the right upper quadrant.  Lumbar scoliosis with mild degenerative change.  IMPRESSION: Gas distended mid abdominal small bowel loops consistent with obstruction.   Original Report Authenticated By: Marlon Pel, M.D.    Ct Abdomen Pelvis W Contrast  04/23/2012  *RADIOLOGY REPORT*  Clinical Data: Bowel obstruction by plain film  CT ABDOMEN AND PELVIS WITH CONTRAST  Technique:  Multidetector CT imaging of the abdomen and pelvis was performed following the standard protocol during bolus administration of intravenous contrast.  Contrast: OMNIPAQUE IOHEXOL 300 MG/ML  SOLN  Comparison: Abdomen films of 04/22/2012 and CT abdomen pelvis of 09/09/2008  Findings: There is parenchymal opacity within the left lower lobe consistent with pneumonia.  Also is minimal bronchiectatic change in the left lower lobe.  The right lung base is clear.  The liver enhances with no focal abnormality and no ductal  dilatation is seen.  Surgical clips are present from prior cholecystectomy.  The pancreas is normal in size and the pancreatic duct is not dilated. The adrenal glands and spleen are unremarkable with a small splenule present.  The kidneys enhance with no calculus or mass and no hydronephrosis is seen.  On delayed images, the pelvocaliceal systems are unremarkable.  The abdominal aorta is normal in caliber.  There are dilated loops of small bowel proximally.  The distal small bowel is decompressed to the terminal ileum.  There does appear to be of a point of caliber change within the anterior left lower abdomen without evidence of mass.  This most likely is due to an adhesion.  There is a small amount of free fluid within the right lower quadrant and pelvis.  The urinary bladder is not well opacified, but the prostate appears to be significantly enlarged and very inhomogeneous.  Invasion of the posterior inferior aspect of  urinary bladder cannot be excluded on this exam.  Delayed images may be helpful to assess the urinary bladder more detail.  No abnormality of the colon is seen.  The terminal ileum is unremarkable.  Degenerative changes are present throughout the facet joints of the lower lumbar spine. The patient was brought back for delayed images through the pelvis for better filling of the urinary bladder.  Again the prostate is noted to be significantly enlarged and irregular worrisome for prostate carcinoma.  The enlarged prostate does indent the urinary bladder and there is some irregularity to the posterior bladder wall, making invasion of tumor a possibility.  IMPRESSION:  1.  Partial small bowel obstruction with apparent caliber change within the anterior left lower abdomen most likely due to adhesion. No mass is seen. 2.  Small amount of free fluid layering within the right lower quadrant and pelvis. 3.  Very irregular inhomogeneous and enlarged prostate worrisome for prostate carcinoma.  Invasion of urinary bladder cannot be excluded. 3.  Left lower lobe parenchymal opacity most consistent with pneumonia.   Original Report Authenticated By: Juline Patch, M.D.    Dg Chest Port 1 View  04/30/2012  *RADIOLOGY REPORT*  Clinical Data: Left lower lobe infiltrate.  PORTABLE CHEST - 1 VIEW  Comparison:  Portal chest 04/29/2012.  Findings: The NG tube and left subclavian line are stable.  The heart is mildly enlarged.  There is improved aeration of the left lung with some persistent interstitial or airspace disease.  Lung volumes are low.  Minimal fluid is noted in the minor fissure.  IMPRESSION:  1.  Improving left lower lobe airspace disease. 2.  Support apparatus is stable.   Original Report Authenticated By: Jamesetta Orleans. MATTERN, M.D.    Dg Chest Port 1 View  04/29/2012  *RADIOLOGY REPORT*  Clinical Data: Evaluate endotracheal tube placement.  PORTABLE CHEST - 1 VIEW  Comparison: Chest x-ray 04/28/2012.  Findings:  Previously noted endotracheal tube has been removed. A nasogastric tube is seen extending into the stomach, however, the tip of the nasogastric tube extends below the lower margin of the image. There is a left-sided subclavian central venous catheter with tip terminating in the proximal superior vena cava. Lung volumes remain low.  There continues to be patchy interstitial and airspace disease in the lungs bilaterally, predominately throughout the left lung.  Improving aeration in the right lung.  Probable trace left pleural effusion.  Heart size is borderline enlarged. Mediastinal contours are similar to prior examinations and slightly distorted by patient positioning.  Multiple  surgical clips at the level of thoracic inlet, likely from prior thyroid surgery.  IMPRESSION: 1.  Persistent multifocal interstitial and airspace disease throughout the lungs bilaterally (left much greater than right), concerning for multilobar pneumonia.  There has been some improvement in aeration in the right lung compared to the prior examination.   Original Report Authenticated By: Florencia Reasons, M.D.    Dg Chest Port 1 View  04/28/2012  *RADIOLOGY REPORT*  Clinical Data: Respiratory failure.  PORTABLE CHEST - 1 VIEW  Comparison: 04/27/2012  Findings: Endotracheal tube is 1.9 cm above the carina.  Patchy densities in the left lung are consistent with airspace disease. Nasogastric tube extends into the abdomen.  Left subclavian central line tip in the upper SVC region.  There are low lung volumes. Heart size is upper limits of normal.  Slightly improved aeration at the left lung base.  IMPRESSION: Patchy airspace densities, left side greater than right.  Slightly improved aeration at the left lung base.  Support apparatuses as described.   Original Report Authenticated By: Richarda Overlie, M.D.    Portable Chest Xray  04/27/2012  *RADIOLOGY REPORT*  Clinical Data: Check endotracheal tube  PORTABLE CHEST - 1 VIEW  Comparison:  04/27/2012  Findings: Endotracheal tube terminates at/just above the carina. Withdrawal approximately 3 cm is suggested.  Patchy left upper/lower lobe opacities, suspicious for pneumonia. Possible small left pleural effusion.  No pneumothorax is seen.  Stable cardiomegaly.  Surgical clips overlying the bilateral neck.  IMPRESSION: Endotracheal tube terminates at/just above the carina.  Withdrawal approximately 3 cm is suggested.  Patchy left upper/lower lobe opacities, suspicious for pneumonia. Possible small left pleural effusion.   Original Report Authenticated By: Charline Bills, M.D.    Dg Chest Port 1 View  04/27/2012  *RADIOLOGY REPORT*  Clinical Data: Post colon surgery and ventilated.  PORTABLE CHEST - 1 VIEW  Comparison: 04/26/2012  Findings: Endotracheal tube is 2.8 cm above the carina.  Persistent patchy airspace densities in the left lung are unchanged.  Few densities in the right hilum are unchanged.  Nasogastric tube extends into the upper stomach.  There is scoliosis of the thoracic spine.  Left subclavian central line tip in upper SVC region. There appears to be contrast material within the colon.  IMPRESSION: Persistent airspace disease in the left lung is concerning for infection.  These findings have minimally changed.  Support apparatuses as described.   Original Report Authenticated By: Richarda Overlie, M.D.    Dg Chest Port 1 View  04/26/2012  *RADIOLOGY REPORT*  Clinical Data: 78 year old male with increased RR interval.  PORTABLE CHEST - 1 VIEW  Comparison: 0934 hours the same day and earlier.  Findings: Upright portable AP view at 9-11 hours.  Stable endotracheal tube tip.  Enteric tube courses to the abdomen, tip not included.  Stable left subclavian line.  Continued low lung volumes.  Continued patchy opacity in the left mid and lower lung. Stable cardiac size and mediastinal contours.  No pneumothorax. Increased atelectasis along the right minor fissure.  No large effusion.  IMPRESSION:  1. Stable lines and tubes. 2.  Continued low lung volumes with left lung airspace disease suggestive of pneumonia.   Original Report Authenticated By: Harley Hallmark, M.D.    Dg Chest Portable 1 View  04/26/2012  *RADIOLOGY REPORT*  Clinical Data: Central line placement in the operating room.  PORTABLE CHEST - 1 VIEW  Comparison: 04/22/2012 and 11/13/2008.  Findings: 0934 hours.  Portions of the lower right  chest are excluded from this portable radiograph.  Endotracheal tube tip is 3.3 cm above the carina.  A left subclavian central venous catheter has been placed.  This projects laterally in the right paratracheal region at the level of the azygos vein.  A nasogastric tube projects below the diaphragm.  There are low lung volumes with patchy perihilar opacities bilaterally.  No pneumothorax or significant pleural effusion is seen.  The heart size and mediastinal contours are stable.  There are multiple surgical clips at the thoracic inlet.  Convex right thoracic scoliosis is noted.  IMPRESSION:  1.  Left subclavian central venous catheter projects laterally and may abut the lateral wall of the SVC near the azygos vein. Correlate clinically. 2.  No pneumothorax. 3.  Patchy perihilar pulmonary opacities bilaterally.   Original Report Authenticated By: Gerrianne Scale, M.D.     Microbiology: Recent Results (from the past 240 hour(s))  CULTURE, BLOOD (ROUTINE X 2)     Status: Normal   Collection Time   04/27/12  2:50 PM      Component Value Range Status Comment   Specimen Description BLOOD RIGHT ARM   Final    Special Requests BOTTLES DRAWN AEROBIC AND ANAEROBIC 10CC   Final    Culture  Setup Time 04/27/2012 20:04   Final    Culture NO GROWTH 5 DAYS   Final    Report Status 05/03/2012 FINAL   Final   CULTURE, BLOOD (ROUTINE X 2)     Status: Normal   Collection Time   04/27/12  2:52 PM      Component Value Range Status Comment   Specimen Description BLOOD LEFT ARM   Final    Special Requests  BOTTLES DRAWN AEROBIC AND ANAEROBIC 10CC   Final    Culture  Setup Time 04/27/2012 20:04   Final    Culture NO GROWTH 5 DAYS   Final    Report Status 05/03/2012 FINAL   Final   MRSA PCR SCREENING     Status: Normal   Collection Time   04/27/12  3:07 PM      Component Value Range Status Comment   MRSA by PCR NEGATIVE  NEGATIVE Final   CULTURE, RESPIRATORY     Status: Normal   Collection Time   04/27/12  3:44 PM      Component Value Range Status Comment   Specimen Description TRACHEAL ASPIRATE   Final    Special Requests NONE   Final    Gram Stain     Final    Value: MODERATE WBC PRESENT, PREDOMINANTLY PMN     NO SQUAMOUS EPITHELIAL CELLS SEEN     NO ORGANISMS SEEN   Culture NO GROWTH 2 DAYS   Final    Report Status 04/30/2012 FINAL   Final   CULTURE, RESPIRATORY     Status: Normal   Collection Time   04/28/12  5:08 AM      Component Value Range Status Comment   Specimen Description TRACHEAL ASPIRATE   Final    Special Requests NONE   Final    Gram Stain     Final    Value: ABUNDANT WBC PRESENT, PREDOMINANTLY PMN     RARE SQUAMOUS EPITHELIAL CELLS PRESENT     NO ORGANISMS SEEN   Culture NO GROWTH 2 DAYS   Final    Report Status 04/30/2012 FINAL   Final   URINE CULTURE     Status: Normal   Collection Time   05/01/12  5:08 PM      Component Value Range Status Comment   Specimen Description URINE, CATHETERIZED   Final    Special Requests NONE   Final    Culture  Setup Time 05/01/2012 17:27   Final    Colony Count NO GROWTH   Final    Culture NO GROWTH   Final    Report Status 05/02/2012 FINAL   Final      Labs: Basic Metabolic Panel:  Lab 05/04/12 8657 05/03/12 0500 05/02/12 0625 05/01/12 0505  NA 133* 134* 138 138  K 4.0 3.6 3.3* 4.0  CL 99 100 101 101  CO2 25 29 29  33*  GLUCOSE 103* 180* 132* 158*  BUN 18 21 29* 31*  CREATININE 1.22 1.06 1.14 1.17  CALCIUM 8.2* 7.9* 8.2* 8.2*  MG -- -- 2.3 --  PHOS -- -- 2.4 --   Liver Function Tests:  Lab 05/03/12 0500 05/02/12  0625  AST 47* 51*  ALT 105* 93*  ALKPHOS 98 107  BILITOT 0.3 0.3  PROT 6.2 6.7  ALBUMIN 2.4* 2.7*   No results found for this basename: LIPASE:5,AMYLASE:5 in the last 168 hours No results found for this basename: AMMONIA:5 in the last 168 hours CBC:  Lab 05/06/12 0512 05/03/12 0500  WBC 7.3 10.8*  NEUTROABS -- --  HGB 11.2* 10.7*  HCT 33.8* 32.5*  MCV 77.2* 78.1  PLT 268 255   Cardiac Enzymes: No results found for this basename: CKTOTAL:5,CKMB:5,CKMBINDEX:5,TROPONINI:5 in the last 168 hours BNP: BNP (last 3 results)  Basename 04/27/12 1420  PROBNP 132.0   CBG:  Lab 05/05/12 0736 05/04/12 2206 05/04/12 1730 05/04/12 1121 05/04/12 0730  GLUCAP 124* 134* 134* 121* 112*    Time coordinating discharge: 40 minutes  Signed:  Cedar Roseman,CHRISTOPHER  [facility] 05/07/2012, 12:10 PM

## 2012-05-06 NOTE — Progress Notes (Signed)
[facility] PROGRESS NOTE  Victor Hunter ION:[id number] DOB: Oct 29, 1933 DOA: 04/22/2012 PCP: Avon Gully, MD  Assessment/Plan: Principal Problem:  *Post-op Acute respiratory failure Active Problems:  Respiratory alkalosis  Aspiration pneumonia  Hypokalemia  Acute renal failure  S/P exploratory laparotomy  Adynamic ileus  CAP (community acquired pneumonia)  Protein calorie malnutrition  Fever  UTI (lower urinary tract infection)  Leukocytosis  Transaminitis  Hypothyroidism  Sepsis associated hypotension  DVT of upper extremity (deep vein thrombosis)    1. Post-op Acute respiratory failure:  Patient was transferred from  [facility] with respiratory failure, on ventilator, and difficulty to wean. This was multifactorial, due to respiratory alkalosis, aspiration pneumonia vs CAP (community acquired pneumonia), as well as super-added effect of pain, hypoxemia a degree of volume overload. Patient is doing well clinically, respiratory failure has resolved, and he is saturating at 985-100% on room air. Continuous pulse oximetry was discontinued on 05/02/12. Antibiotic treatment concluded on 05/03/12. 2. Shock/? Sepsis associated hypotension:   This was addressed with antibiotics, and volume resuscitation, and has now resolved. Stress dose steroids have been weaned and finally discontinued on 05/01/12.  S/P exploratory laparotomy/ Adynamic ileus  Patient initially presented with clinical features of bowel obstruction, but was found to have adynamic ileus during surgery. Clinically, this has resolved with bowel rest, mobilization, and parenteral TNA. Patient has been commenced on oral fluids on 05/02/12 and diet advanced to full liquids today. As oral intake improved, TNA was discontinued on 05/03/12. Patient is now on low residue diet. Per surgical team, stable for discharge.  3. Acute renal failure:   Patient's creatinine reached a peak of 1.63 during hospitalization, against a known baseline  of 0.93, consistent with acute kidney injury. As of 05/03/12, creatinine is within normal limits, at 1.06. AKI has resolved. 4. Leukocytosis/Fever/UTI (lower urinary tract infection):   White cell count had initially trended upward, associated with a low-grade fever. Leukocytosis could primarily be related to steroids, but urinalysis on 04/27/2012 appeared to be consistent with a urinary tract infection. Repeat urinalysis and culture was ordered on 05/01/12 and remained negative. Levaquin therapy should prove adequate, and as described above, has been concluded on 05/03/12. Wcc has normalized.  5. Hypokalemia:   Repleted as indicated. Resolved.   6. Protein calorie malnutrition:   Patient had been NPO for a significant amount of time, and is also hypoalbuminemic, consistent with moderate protein-calorie malnutrition. Managed with parenteral nutrition initially, weaned off on 05/03/12, now on low residue diet. Dietary recommendations have been obtained. 7. Transaminitis  Likely secondary to parenteral nutrition: This is mild, and of no clinical consequence.   8. Hypothyroidism:   On thyroxine replacement therapy, albeit parenterally. Transitioned to oral formulation on 05/02/12. 9. Deconditioning: Patient is profoundly deconditioned by recent acute medical problems. He continues to make progress, and has been recommended HHPT/OT.  10: LUE DVT: Patient  Was on 05/03/12, noted to have moderate LUE edema. As he had left subclavian central line at the time, venous doppler was ordered, and revealed acute DVT of the left radial and ulnar veins. Patient was commenced on therapeutic Lovenox/Coumadin on that date, and central line, removed. Repeat venous doppler on 05/06/12 is pending. Likely, anticoagulation will be only short term.    Code Status: Full Code. Family Communication:  Disposition Plan:  Discharge on 05/07/12.   Brief narrative: 78 year old male patient initially admitted [facility] on 04/22/2012  with abdominal distention felt to be related to small bowel obstruction. He was subsequently taken to the operating  room where he underwent exploratory laparotomy but no abnormal abdominal pathology found except for adynamic ileus. Preoperatively patient had been previously diagnosed with community acquired pneumonia versus aspiration pneumonia. On 04/27/2012 they were unable to wean him from the ventilator and therefore he was transferred to [facility] for further evaluation. He was accepted by pulmonary critical care medicine. He was subsequently extubated on 04/28/2012. Surgery has been following the patient since arrival. He is currently on parenteral nutrition and his NG tube has recently been discontinued and he is tolerating ice chips without difficulty. He was deemed appropriate to transfer to the step down unit on 04/30/2012. [facility] team 1 accepted care of this patient on 05/01/2012.   Consultants:  Dr Harriette Bouillon, surgeon.  Dr Faith Rogue, physical Medicine and rehab  Procedures:  Serial CXRs.   Antibiotics:  Azithromycin 04/24/12 only.  Rocephin 04/24/12-04/25/12.  Levaquin 04/27/12-05/03/12.  Zosyn 04/27/12-04/28/12.  Vancomycin 04/27/12-04/29/12.  HPI/Subjective: No new issues.   Objective: Vital signs in last 24 hours: Temp:  [98.4 F (36.9 C)-98.8 F (37.1 C)] 98.4 F (36.9 C) (09/02 0540) Pulse Rate:  [61-74] 61  (09/02 0540) Resp:  [17-19] 17  (09/02 0540) BP: (131-139)/(73-75) 131/73 mmHg (09/02 0540) SpO2:  [96 %] 96 % (09/02 0540) Weight change:  Last BM Date: 05/05/12  Intake/Output from previous day: 09/01 0701 - 09/02 0700 In: 240 [P.O.:240] Out: -  Total I/O In: 240 [P.O.:240] Out: -    Physical Exam: General: Comfortable, alert, communicative, fully oriented, not short of breath at rest.  HEENT:  Mild clinical pallor, no jaundice, no conjunctival injection or discharge. NECK:  Supple, JVP not seen, no carotid bruits, no palpable  lymphadenopathy, no palpable goiter. CHEST:  Clinically clear to auscultation, no wheezes, no crackles. HEART:  Sounds 1 and 2 heard, normal, regular, no murmurs. ABDOMEN:  Full, soft, non-tender, no palpable organomegaly, no palpable masses, normal bowel sounds. Surgical wound is healing well.  GENITALIA:  Not examined. UPPER EXTREMITIES: LUE appears less swollen.  LOWER EXTREMITIES:  Mild pitting edema, palpable peripheral pulses. MUSCULOSKELETAL SYSTEM:  Generalized osteoarthritic changes, otherwise, normal. CENTRAL NERVOUS SYSTEM:  No focal neurologic deficit on gross examination.  Lab Results:  [facility] 05/06/12 0512  WBC 7.3  HGB 11.2*  HCT 33.8*  PLT 268    Basename 05/04/12 0550  NA 133*  K 4.0  CL 99  CO2 25  GLUCOSE 103*  BUN 18  CREATININE 1.22  CALCIUM 8.2*   Recent Results (from the past 240 hour(s))  CULTURE, BLOOD (ROUTINE X 2)     Status: Normal   Collection Time   04/27/12  2:50 PM      Component Value Range Status Comment   Specimen Description BLOOD RIGHT ARM   Final    Special Requests BOTTLES DRAWN AEROBIC AND ANAEROBIC 10CC   Final    Culture  Setup Time 04/27/2012 20:04   Final    Culture NO GROWTH 5 DAYS   Final    Report Status 05/03/2012 FINAL   Final   CULTURE, BLOOD (ROUTINE X 2)     Status: Normal   Collection Time   04/27/12  2:52 PM      Component Value Range Status Comment   Specimen Description BLOOD LEFT ARM   Final    Special Requests BOTTLES DRAWN AEROBIC AND ANAEROBIC 10CC   Final    Culture  Setup Time 04/27/2012 20:04   Final    Culture NO GROWTH 5 DAYS   Final  Report Status 05/03/2012 FINAL   Final   MRSA PCR SCREENING     Status: Normal   Collection Time   04/27/12  3:07 PM      Component Value Range Status Comment   MRSA by PCR NEGATIVE  NEGATIVE Final   CULTURE, RESPIRATORY     Status: Normal   Collection Time   04/27/12  3:44 PM      Component Value Range Status Comment   Specimen Description TRACHEAL ASPIRATE   Final     Special Requests NONE   Final    Gram Stain     Final    Value: MODERATE WBC PRESENT, PREDOMINANTLY PMN     NO SQUAMOUS EPITHELIAL CELLS SEEN     NO ORGANISMS SEEN   Culture NO GROWTH 2 DAYS   Final    Report Status 04/30/2012 FINAL   Final   CULTURE, RESPIRATORY     Status: Normal   Collection Time   04/28/12  5:08 AM      Component Value Range Status Comment   Specimen Description TRACHEAL ASPIRATE   Final    Special Requests NONE   Final    Gram Stain     Final    Value: ABUNDANT WBC PRESENT, PREDOMINANTLY PMN     RARE SQUAMOUS EPITHELIAL CELLS PRESENT     NO ORGANISMS SEEN   Culture NO GROWTH 2 DAYS   Final    Report Status 04/30/2012 FINAL   Final   URINE CULTURE     Status: Normal   Collection Time   05/01/12  5:08 PM      Component Value Range Status Comment   Specimen Description URINE, CATHETERIZED   Final    Special Requests NONE   Final    Culture  Setup Time 05/01/2012 17:27   Final    Colony Count NO GROWTH   Final    Culture NO GROWTH   Final    Report Status 05/02/2012 FINAL   Final      Studies/Results: No results found.  Medications: Scheduled Meds:    . antiseptic oral rinse  15 mL Mouth Rinse q12n4p  . chlorhexidine  15 mL Mouth Rinse BID  . enoxaparin (LOVENOX) injection  80 mg Subcutaneous Q12H  . hydrochlorothiazide  25 mg Oral Daily  . levothyroxine  125 mcg Oral QAC breakfast  . lisinopril  20 mg Oral Daily  . metoCLOPramide  5 mg Oral TID AC & HS  . pantoprazole  40 mg Oral Q0600  . polyethylene glycol  17 g Oral Daily  . sodium chloride  10-40 mL Intracatheter Q12H  . Tamsulosin HCl  0.4 mg Oral QPC supper  . warfarin  10 mg Oral ONCE-1800  . warfarin  7.5 mg Oral ONCE-1800  . Warfarin - Pharmacist Dosing Inpatient   Does not apply q1800   Continuous Infusions:   PRN Meds:.cloNIDine, ondansetron    LOS: 14 days   Terrace Fontanilla,Victor Hunter  [facility] Pager [phone]. If 8PM-8AM, please contact night-coverage at [email or web address],  password [facility] 05/06/2012, 2:05 PM  LOS: 14 days

## 2012-05-07 ENCOUNTER — Other Ambulatory Visit: Payer: Self-pay | Admitting: Internal Medicine

## 2012-05-07 MED ORDER — METOCLOPRAMIDE HCL 5 MG PO TABS: 5.0000 mg | ORAL_TABLET | Freq: Three times a day (TID) | ORAL | Status: DC

## 2012-05-07 MED ORDER — POLYETHYLENE GLYCOL 3350 17 G PO PACK: 17.0000 g | PACK | Freq: Every day | ORAL | Status: AC

## 2012-05-07 MED ORDER — LISINOPRIL 20 MG PO TABS: 20.0000 mg | ORAL_TABLET | Freq: Every day | ORAL | Status: DC

## 2012-05-07 MED ORDER — PANTOPRAZOLE SODIUM 40 MG PO TBEC: 40.0000 mg | DELAYED_RELEASE_TABLET | Freq: Every day | ORAL | Status: DC

## 2012-05-07 NOTE — Progress Notes (Signed)
DC home with daughter. Pt and family verbally understood DC instructions. No questions asked

## 2012-05-07 NOTE — Progress Notes (Signed)
Nutrition Follow-up  Intervention:   No nutrition-related interventions required at this time.  Pt consuming 100% of PO diet and is likely meeting estimated needs without supplementation.  Assessment:   Pt transitioned to Low Fiber diet s/p resolve of adynamic ileus.  Pt weaned from TPN (8/30) and PO intake has improved to 100% of 3 meals yesterday. Pt speaking with home health RN at time of visit.  Note possible d/c home soon.  Diet Order:  Low Fiber  Meds: Scheduled Meds:   . enoxaparin (LOVENOX) injection  40 mg Subcutaneous Q24H  . hydrochlorothiazide  25 mg Oral Daily  . levothyroxine  125 mcg Oral QAC breakfast  . lisinopril  20 mg Oral Daily  . metoCLOPramide  5 mg Oral TID AC & HS  . pantoprazole  40 mg Oral Q0600  . polyethylene glycol  17 g Oral Daily  . sodium chloride  10-40 mL Intracatheter Q12H  . Tamsulosin HCl  0.4 mg Oral QPC supper  . DISCONTD: antiseptic oral rinse  15 mL Mouth Rinse q12n4p  . DISCONTD: chlorhexidine  15 mL Mouth Rinse BID  . DISCONTD: enoxaparin (LOVENOX) injection  80 mg Subcutaneous Q12H  . DISCONTD: warfarin  7.5 mg Oral ONCE-1800  . DISCONTD: Warfarin - Pharmacist Dosing Inpatient   Does not apply q1800   Continuous Infusions:  PRN Meds:.cloNIDine, ondansetron  Labs:  CMP     Component Value Date/Time   NA 133* 05/04/2012 0550   K 4.0 05/04/2012 0550   CL 99 05/04/2012 0550   CO2 25 05/04/2012 0550   GLUCOSE 103* 05/04/2012 0550   BUN 18 05/04/2012 0550   CREATININE 1.22 05/04/2012 0550   CALCIUM 8.2* 05/04/2012 0550   PROT 6.2 05/03/2012 0500   ALBUMIN 2.4* 05/03/2012 0500   AST 47* 05/03/2012 0500   ALT 105* 05/03/2012 0500   ALKPHOS 98 05/03/2012 0500   BILITOT 0.3 05/03/2012 0500   GFRNONAA 56* 05/04/2012 0550   GFRAA 65* 05/04/2012 0550   Lipid Panel     Component Value Date/Time   CHOL 120 04/28/2012 0405   TRIG 211* 04/28/2012 0405    Intake/Output Summary (Last 24 hours) at 05/07/12 1029 Last data filed at 05/07/12 1006  Gross  per 24 hour  Intake    960 ml  Output      0 ml  Net    960 ml    Weight Status:  183 lbs, Consistent with admission wt  Re-estimated needs:  1600-1800 kcals, 90-105g protein  Nutrition Dx:  Inadequate oral intake, resolving  Monitor:   1.  Parenteral nutrition; tolerance. No longer appropriate, discontinue 2.  Nutrition related labs; monitor trends.  Met, ongoing. No new lipid panel since d/c of TNA.  3.  Wt/wt change; monitor trends.  Met, wt stable. 4.  Food/Beverage; improvement in PO intake.  Met, pt tolerating PO diet with adequate intake.  Continue.   Loyce Dys, MS RD LDN Clinical Inpatient Dietitian Pager: [phone] Weekend/After hours pager: [phone]

## 2012-05-07 NOTE — Progress Notes (Signed)
Patient ID: Victor Hunter, male   DOB: [birth year], 78 y.o.   MRN: [id number] 11 Days Post-Op  Subjective: Pt feels well.  No complaints  Objective: Vital signs in last 24 hours: Temp:  [98.1 F (36.7 C)-99 F (37.2 C)] 98.1 F (36.7 C) (09/03 0531) Pulse Rate:  [62-70] 62  (09/03 0531) Resp:  [18] 18  (09/03 0531) BP: (114-149)/(66-71) 116/68 mmHg (09/03 0531) SpO2:  [97 %-100 %] 100 % (09/03 0531) Weight:  [183 lb 6.8 oz (83.2 kg)] 183 lb 6.8 oz (83.2 kg) (09/03 0531) Last BM Date: 05/06/12  Intake/Output from previous day: 09/02 0701 - 09/03 0700 In: 720 [P.O.:720] Out: -  Intake/Output this shift: Total I/O In: 240 [P.O.:240] Out: -   PE: Abd: soft, Nt, ND, +BS, incision c/d/i with staples  Lab Results:   Basename 05/06/12 0512  WBC 7.3  HGB 11.2*  HCT 33.8*  PLT 268   BMET No results found for this basename: NA:2,K:2,CL:2,CO2:2,GLUCOSE:2,BUN:2,CREATININE:2,CALCIUM:2 in the last 72 hours PT/INR  Basename 05/06/12 0512 05/05/12 1456  LABPROT 22.8* 17.1*  INR 1.97* 1.37   CMP     Component Value Date/Time   NA 133* 05/04/2012 0550   K 4.0 05/04/2012 0550   CL 99 05/04/2012 0550   CO2 25 05/04/2012 0550   GLUCOSE 103* 05/04/2012 0550   BUN 18 05/04/2012 0550   CREATININE 1.22 05/04/2012 0550   CALCIUM 8.2* 05/04/2012 0550   PROT 6.2 05/03/2012 0500   ALBUMIN 2.4* 05/03/2012 0500   AST 47* 05/03/2012 0500   ALT 105* 05/03/2012 0500   ALKPHOS 98 05/03/2012 0500   BILITOT 0.3 05/03/2012 0500   GFRNONAA 56* 05/04/2012 0550   GFRAA 65* 05/04/2012 0550   Lipase     Component Value Date/Time   LIPASE 20 04/27/2012 1420       Studies/Results: No results found.  Anti-infectives: Anti-infectives     Start     Dose/Rate Route Frequency Ordered Stop   04/29/12 1500   levofloxacin (LEVAQUIN) IVPB 750 mg        750 mg 100 mL/hr over 90 Minutes Intravenous Every 48 hours 04/29/12 1229 05/03/12 1550   04/28/12 1200   vancomycin (VANCOCIN) 750 mg in sodium chloride  0.9 % 150 mL IVPB  Status:  Discontinued        750 mg 150 mL/hr over 60 Minutes Intravenous Every 12 hours 04/28/12 1149 04/29/12 1226   04/27/12 1600   vancomycin (VANCOCIN) 1,500 mg in sodium chloride 0.9 % 500 mL IVPB        1,500 mg 250 mL/hr over 120 Minutes Intravenous  Once 04/27/12 1414 04/27/12 1821   04/27/12 1500   levofloxacin (LEVAQUIN) IVPB 750 mg  Status:  Discontinued        750 mg 100 mL/hr over 90 Minutes Intravenous Every 48 hours 04/27/12 1414 04/29/12 1229   04/27/12 1445   piperacillin-tazobactam (ZOSYN) IVPB 3.375 g  Status:  Discontinued        3.375 g 12.5 mL/hr over 240 Minutes Intravenous 3 times per day 04/27/12 1414 04/29/12 1226   04/25/12 1605   cefOXitin (MEFOXIN) 2 g in dextrose 5 % 50 mL IVPB  Status:  Discontinued        2 g 100 mL/hr over 30 Minutes Intravenous 60 min pre-op 04/25/12 1605 04/26/12 1039   04/24/12 0930   azithromycin (ZITHROMAX) 500 mg in dextrose 5 % 250 mL IVPB  Status:  Discontinued  500 mg 250 mL/hr over 60 Minutes Intravenous Every 24 hours 04/24/12 0753 04/25/12 1251   04/24/12 0900   cefTRIAXone (ROCEPHIN) 1 g in dextrose 5 % 50 mL IVPB  Status:  Discontinued        1 g 100 mL/hr over 30 Minutes Intravenous Every 24 hours 04/24/12 0753 04/25/12 1252           Assessment/Plan  1. S/p ex lap  2. Post op ileus, resolved  Plan: 1. Ok for [employer] from our standpoint.  Will dc staples 2. Follow up with operating surgeons.   LOS: 15 days    Imraan Wendell E 05/07/2012

## 2012-07-25 ENCOUNTER — Encounter (HOSPITAL_COMMUNITY): Payer: Self-pay | Admitting: Physical Medicine and Rehabilitation

## 2012-07-25 ENCOUNTER — Emergency Department (HOSPITAL_COMMUNITY)
Admission: EM | Admit: 2012-07-25 | Discharge: 2012-07-25 | Disposition: A | Discharge status: Home / Self Care | Payer: Medicare PPO | Attending: Emergency Medicine | Admitting: Emergency Medicine

## 2012-07-25 DIAGNOSIS — R066 Hiccough: Secondary | ICD-10-CM | POA: Insufficient documentation

## 2012-07-25 DIAGNOSIS — Z87448 Personal history of other diseases of urinary system: Secondary | ICD-10-CM | POA: Insufficient documentation

## 2012-07-25 DIAGNOSIS — I10 Essential (primary) hypertension: Secondary | ICD-10-CM | POA: Insufficient documentation

## 2012-07-25 DIAGNOSIS — R05 Cough: Secondary | ICD-10-CM | POA: Insufficient documentation

## 2012-07-25 DIAGNOSIS — R059 Cough, unspecified: Secondary | ICD-10-CM | POA: Insufficient documentation

## 2012-07-25 DIAGNOSIS — K219 Gastro-esophageal reflux disease without esophagitis: Secondary | ICD-10-CM | POA: Insufficient documentation

## 2012-07-25 DIAGNOSIS — D649 Anemia, unspecified: Secondary | ICD-10-CM | POA: Insufficient documentation

## 2012-07-25 DIAGNOSIS — Z79899 Other long term (current) drug therapy: Secondary | ICD-10-CM | POA: Insufficient documentation

## 2012-07-25 DIAGNOSIS — Z87891 Personal history of nicotine dependence: Secondary | ICD-10-CM | POA: Insufficient documentation

## 2012-07-25 DIAGNOSIS — Z8719 Personal history of other diseases of the digestive system: Secondary | ICD-10-CM | POA: Insufficient documentation

## 2012-07-25 DIAGNOSIS — Z7982 Long term (current) use of aspirin: Secondary | ICD-10-CM | POA: Insufficient documentation

## 2012-07-25 DIAGNOSIS — R0602 Shortness of breath: Secondary | ICD-10-CM | POA: Insufficient documentation

## 2012-07-25 DIAGNOSIS — E079 Disorder of thyroid, unspecified: Secondary | ICD-10-CM | POA: Insufficient documentation

## 2012-07-25 MED ORDER — CHLORPROMAZINE HCL 25 MG/ML IJ SOLN
50.0000 mg | Freq: Once | INTRAMUSCULAR | Status: AC
  Administered 2012-07-25: 50 mg via INTRAMUSCULAR
  Filled 2012-07-25: qty 2

## 2012-07-25 MED ORDER — CHLORPROMAZINE HCL 10 MG PO TABS: 10.0000 mg | ORAL_TABLET | Freq: Three times a day (TID) | ORAL | Status: DC

## 2012-07-25 NOTE — ED Notes (Signed)
MD at bedside. 

## 2012-07-25 NOTE — ED Provider Notes (Addendum)
History  This chart was scribed for Cheri Guppy, MD by Ardeen Jourdain, ED Scribe. This patient was seen in room TR11C/TR11C and the patient's care was started at 1701.  CSN: [id number]  Arrival date & time 07/25/12  1654   None     Chief Complaint  Patient presents with  . Hiccups     The history is provided by the patient. No language interpreter was used.    Victor Hunter is a 78 y.o. male who presents to the Emergency Department complaining of hiccups that have been constant for the past 3 days. He has associated belching, cough and SOB when hiccupping. He denies pain, nausea, emesis, fever, chills, diaphorisis and other breathing problems as associated symptoms. He has a h/o HTN, GERD and thyroid disease. He is a former smoker but denies alcohol use.    Past Medical History  Diagnosis Date  . Hypertension   . GERD (gastroesophageal reflux disease)   . Thyroid disease   . Upper GI bleed   . Ileus   . Mallory - Weiss tear   . Chronic anemia   . Enlarged prostate     Elevated PSA  . 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 Opal Sidles benign biopsies February 2005  . History of BPH     s/p TURP 2008    Past Surgical History  Procedure Date  . Thyroidectomy   . Cholecystectomy   . Esophagogastroduodenoscopy 08/31/08    Rourk-possible extrinsic impression on midesophagus, noncritical Schatzki's ring, 3 mm fundal gland polyp, multiple gastric fundic polyps not manipulated, small hiatal hernia  . Colonoscopy 10/29/03    rourk-inflammatory polyp from 40 cm removed  . Knee arthroscopy     pt denies  . Transurethral resection of prostate     pt denies, but this is listed in his past medical records.   . Laparotomy 04/26/2012    Procedure: EXPLORATORY LAPAROTOMY;  Surgeon: Fabio Bering, MD;  Location: [facility];  Service: General;  Laterality: N/A;  . Laparotomy 04/26/2012    Procedure:  EXPLORATORY LAPAROTOMY;  Surgeon: Fabio Bering, MD;  Location: [facility];  Service: General;  Laterality: N/A;  . Central venous catheter insertion 04/26/2012    Procedure: INSERTION CENTRAL LINE ADULT;  Surgeon: Fabio Bering, MD;  Location: [facility];  Service: General;  Laterality: Left;  started at 0919, ended at 0930,        subclavian    Family History  Problem Relation Age of Onset  . Cancer Other   . Heart failure Other   . Liver disease Sister   . Breast cancer Sister   . Hypertension Mother   . Stroke Father   . Colon cancer Neg Hx     History  Substance Use Topics  . Smoking status: Former Smoker -- 0.5 packs/day    Types: Cigarettes  . Smokeless tobacco: Never Used  . Alcohol Use: No      Review of Systems  Constitutional: Negative for fever, chills and diaphoresis.  Respiratory: Positive for cough and shortness of breath.        Hiccups   Gastrointestinal: Negative for nausea and vomiting.    Allergies  Review of patient's allergies indicates no known allergies.  Home Medications   Current Outpatient Rx  Name  Route  Sig  Dispense  Refill  . ASPIRIN EC 81 MG PO TBEC   Oral   Take 81  mg by mouth daily.         Marland Kitchen CALCIUM CARBONATE ANTACID 500 MG PO CHEW   Oral   Chew 1 tablet by mouth daily as needed. Acid Reflux         . HYDROCHLOROTHIAZIDE 25 MG PO TABS   Oral   Take 25 mg by mouth daily.         Marland Kitchen LEVOTHYROXINE SODIUM 125 MCG PO TABS   Oral   Take 125 mcg by mouth daily.         Marland Kitchen LISINOPRIL 20 MG PO TABS   Oral   Take 1 tablet (20 mg total) by mouth daily.   30 tablet   0   . METOCLOPRAMIDE HCL 5 MG PO TABS   Oral   Take 1 tablet (5 mg total) by mouth 4 (four) times daily -  before meals and at bedtime.   120 tablet   0   . PANTOPRAZOLE SODIUM 40 MG PO TBEC   Oral   Take 1 tablet (40 mg total) by mouth daily at 6 (six) AM.   30 tablet   0   . POTASSIUM CHLORIDE CRYS ER 20 MEQ PO TBCR   Oral   Take 20 mEq by mouth  daily.         Marland Kitchen TAMSULOSIN HCL 0.4 MG PO CAPS   Oral   Take 0.4 mg by mouth daily after supper.           Triage Vitals: BP 125/65  Pulse 108  Temp 98.9 F (37.2 C) (Oral)  Resp 20  SpO2 98%  Physical Exam  Nursing note and vitals reviewed. Constitutional: He is oriented to person, place, and time. He appears well-developed and well-nourished. No distress.  HENT:  Head: Normocephalic and atraumatic.  Eyes: Conjunctivae normal and EOM are normal. Pupils are equal, round, and reactive to light.  Neck: Normal range of motion. Neck supple. No tracheal deviation present.  Cardiovascular: Normal rate and regular rhythm.   Murmur heard. Pulmonary/Chest: Effort normal and breath sounds normal. No respiratory distress.       Lungs clear bilaterally   Abdominal: Soft. He exhibits no distension. There is no tenderness.  Musculoskeletal: Normal range of motion. He exhibits no edema.  Neurological: He is alert and oriented to person, place, and time.  Skin: Skin is warm and dry.  Psychiatric: He has a normal mood and affect. His behavior is normal.    ED Course  Procedures (including critical care time)  DIAGNOSTIC STUDIES: Oxygen Saturation is 98% on room air, normal by my interpretation.    COORDINATION OF CARE:  5:13 PM: Discussed treatment plan which includes an injection to stop the hiccups with pt at bedside and pt agreed to plan.    Labs Reviewed - No data to display No results found.   No diagnosis found.  ECG NSR at 99 bpm Nl axis Nl intervals. Nonspecific sttws  6:37 PM hiccups resolved  MDM  Hiccups for 3 d. No other sxs.  No evidence ami, irregular heart beat.        I personally performed the services described in this documentation, which was scribed in my presence. The recorded information has been reviewed and is accurate.     Cheri Guppy, MD 07/25/12 1722  Cheri Guppy, MD 07/25/12 Paulo Fruit

## 2012-07-25 NOTE — ED Notes (Signed)
Pt presents to department for evaluation of hiccups. Ongoing x3 days. Pt also states belching and burping. Denies pain. Respirations unlabored. No signs of distress noted.

## 2012-07-29 ENCOUNTER — Emergency Department (HOSPITAL_COMMUNITY): Payer: Medicare PPO

## 2012-07-29 ENCOUNTER — Encounter (HOSPITAL_COMMUNITY): Payer: Self-pay | Admitting: *Deleted

## 2012-07-29 ENCOUNTER — Emergency Department (HOSPITAL_COMMUNITY)
Admission: EM | Admit: 2012-07-29 | Discharge: 2012-07-29 | Disposition: A | Discharge status: Home / Self Care | Payer: Medicare PPO | Attending: Emergency Medicine | Admitting: Emergency Medicine

## 2012-07-29 DIAGNOSIS — Z87891 Personal history of nicotine dependence: Secondary | ICD-10-CM | POA: Insufficient documentation

## 2012-07-29 DIAGNOSIS — Z87448 Personal history of other diseases of urinary system: Secondary | ICD-10-CM | POA: Insufficient documentation

## 2012-07-29 DIAGNOSIS — R066 Hiccough: Secondary | ICD-10-CM | POA: Insufficient documentation

## 2012-07-29 DIAGNOSIS — K219 Gastro-esophageal reflux disease without esophagitis: Secondary | ICD-10-CM | POA: Insufficient documentation

## 2012-07-29 DIAGNOSIS — I10 Essential (primary) hypertension: Secondary | ICD-10-CM | POA: Insufficient documentation

## 2012-07-29 DIAGNOSIS — E079 Disorder of thyroid, unspecified: Secondary | ICD-10-CM | POA: Insufficient documentation

## 2012-07-29 DIAGNOSIS — K5900 Constipation, unspecified: Secondary | ICD-10-CM | POA: Insufficient documentation

## 2012-07-29 DIAGNOSIS — Z7982 Long term (current) use of aspirin: Secondary | ICD-10-CM | POA: Insufficient documentation

## 2012-07-29 DIAGNOSIS — D500 Iron deficiency anemia secondary to blood loss (chronic): Secondary | ICD-10-CM | POA: Insufficient documentation

## 2012-07-29 DIAGNOSIS — Z79899 Other long term (current) drug therapy: Secondary | ICD-10-CM | POA: Insufficient documentation

## 2012-07-29 DIAGNOSIS — Z8719 Personal history of other diseases of the digestive system: Secondary | ICD-10-CM | POA: Insufficient documentation

## 2012-07-29 DIAGNOSIS — K226 Gastro-esophageal laceration-hemorrhage syndrome: Secondary | ICD-10-CM | POA: Insufficient documentation

## 2012-07-29 LAB — COMPREHENSIVE METABOLIC PANEL
Alkaline Phosphatase: 138 U/L — ABNORMAL HIGH (ref 39–117)
BUN: 19 mg/dL (ref 6–23)
Creatinine, Ser: 1.12 mg/dL (ref 0.50–1.35)
GFR calc Af Amer: 72 mL/min — ABNORMAL LOW (ref >90)
Glucose, Bld: 116 mg/dL — ABNORMAL HIGH (ref 70–99)
Potassium: 3.4 mEq/L — ABNORMAL LOW (ref 3.5–5.1)
Total Bilirubin: 0.1 mg/dL — ABNORMAL LOW (ref 0.3–1.2)
Total Protein: 6.2 g/dL (ref 6.0–8.3)

## 2012-07-29 LAB — CBC WITH DIFFERENTIAL/PLATELET
Basophils Relative: 1 % (ref 0–1)
Eosinophils Absolute: 0.1 10*3/uL (ref 0.0–0.7)
HCT: 24.6 % — ABNORMAL LOW (ref 39.0–52.0)
Hemoglobin: 8.1 g/dL — ABNORMAL LOW (ref 13.0–17.0)
Lymphs Abs: 2.6 10*3/uL (ref 0.7–4.0)
MCH: 25.6 pg — ABNORMAL LOW (ref 26.0–34.0)
MCHC: 32.9 g/dL (ref 30.0–36.0)
MCV: 77.8 fL — ABNORMAL LOW (ref 78.0–100.0)
Monocytes Absolute: 0.8 10*3/uL (ref 0.1–1.0)
Monocytes Relative: 9 % (ref 3–12)
Neutrophils Relative %: 61 % (ref 43–77)
RBC: 3.16 MIL/uL — ABNORMAL LOW (ref 4.22–5.81)

## 2012-07-29 LAB — URINALYSIS, ROUTINE W REFLEX MICROSCOPIC
Ketones, ur: NEGATIVE mg/dL
Leukocytes, UA: NEGATIVE
Nitrite: NEGATIVE
Protein, ur: NEGATIVE mg/dL

## 2012-07-29 LAB — LIPASE, BLOOD: Lipase: 60 U/L — ABNORMAL HIGH (ref 11–59)

## 2012-07-29 MED ORDER — ONDANSETRON HCL 4 MG/2ML IJ SOLN
4.0000 mg | Freq: Once | INTRAMUSCULAR | Status: DC
  Filled 2012-07-29: qty 2

## 2012-07-29 MED ORDER — SODIUM CHLORIDE 0.9 % IV SOLN
INTRAVENOUS | Status: DC
  Administered 2012-07-29: 19:00:00 via INTRAVENOUS

## 2012-07-29 NOTE — ED Notes (Signed)
Pt. Denies nausea at this time. Will reassess.

## 2012-07-29 NOTE — ED Notes (Signed)
Hiccups that almost cut his breath off when they start.  Pt was seen here Thursday for the same and got a shot of Thorazine and some pills.  Pt has 5 pills left.  Pt feels like acid in belly and makes him belch

## 2012-07-29 NOTE — ED Provider Notes (Signed)
History     CSN: [id number]  Arrival date & time 07/29/12  1623   First MD Initiated Contact with Patient 07/29/12 1803      No chief complaint on file.   (Consider location/radiation/quality/duration/timing/severity/associated sxs/prior treatment) HPI Comments: Victor Hunter is a 78 y.o. Male who began having tabs one week ago. He was seen here and treated with Thorazine, without relief. He continues to take the Thorazine. He also has intermittent cough that is nonproductive. He feels like his abdomen is distended. His last bowel movement was 3 days ago. There's been no fever. He is able to tolerate liquids. Today. He tolerated things. He occasionally has reflux of food materials associated with hiccups.  He has had hiccups previously that resolved spontaneously. There are no other modifying factors.  The history is provided by the patient.    Past Medical History  Diagnosis Date  . Hypertension   . GERD (gastroesophageal reflux disease)   . Thyroid disease   . Upper GI bleed   . Ileus   . Mallory - Weiss tear   . Chronic anemia   . Enlarged prostate     Elevated PSA  . 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 Opal Sidles benign biopsies February 2005  . History of BPH     s/p TURP 2008    Past Surgical History  Procedure Date  . Thyroidectomy   . Cholecystectomy   . Esophagogastroduodenoscopy 08/31/08    Rourk-possible extrinsic impression on midesophagus, noncritical Schatzki's ring, 3 mm fundal gland polyp, multiple gastric fundic polyps not manipulated, small hiatal hernia  . Colonoscopy 10/29/03    rourk-inflammatory polyp from 40 cm removed  . Knee arthroscopy     pt denies  . Transurethral resection of prostate     pt denies, but this is listed in his past medical records.   . Laparotomy 04/26/2012    Procedure: EXPLORATORY LAPAROTOMY;  Surgeon: Fabio Bering, MD;  Location: [facility];   Service: General;  Laterality: N/A;  . Laparotomy 04/26/2012    Procedure: EXPLORATORY LAPAROTOMY;  Surgeon: Fabio Bering, MD;  Location: [facility];  Service: General;  Laterality: N/A;  . Central venous catheter insertion 04/26/2012    Procedure: INSERTION CENTRAL LINE ADULT;  Surgeon: Fabio Bering, MD;  Location: [facility];  Service: General;  Laterality: Left;  started at 0919, ended at 0930,        subclavian    Family History  Problem Relation Age of Onset  . Cancer Other   . Heart failure Other   . Liver disease Sister   . Breast cancer Sister   . Hypertension Mother   . Stroke Father   . Colon cancer Neg Hx     History  Substance Use Topics  . Smoking status: Former Smoker -- 0.5 packs/day    Types: Cigarettes  . Smokeless tobacco: Never Used  . Alcohol Use: No      Review of Systems  All other systems reviewed and are negative.    Allergies  Review of patient's allergies indicates no known allergies.  Home Medications   Current Outpatient Rx  Name  Route  Sig  Dispense  Refill  . ASPIRIN EC 81 MG PO TBEC   Oral   Take 81 mg by mouth daily.         Marland Kitchen CALCIUM CARBONATE ANTACID 500 MG PO CHEW   Oral  Chew 2 tablets by mouth daily as needed. Acid Reflux         . CHLORPROMAZINE HCL 10 MG PO TABS   Oral   Take 1 tablet (10 mg total) by mouth 3 (three) times daily.   15 tablet   0   . DILTIAZEM HCL ER 180 MG PO CP24   Oral   Take 180 mg by mouth daily.         Marland Kitchen HYDROCHLOROTHIAZIDE 25 MG PO TABS   Oral   Take 25 mg by mouth daily.         Marland Kitchen LEVOTHYROXINE SODIUM 125 MCG PO TABS   Oral   Take 125 mcg by mouth daily.         Marland Kitchen LISINOPRIL 20 MG PO TABS   Oral   Take 1 tablet (20 mg total) by mouth daily.   30 tablet   0   . PANTOPRAZOLE SODIUM 40 MG PO TBEC   Oral   Take 1 tablet (40 mg total) by mouth daily at 6 (six) AM.   30 tablet   0   . POTASSIUM CHLORIDE CRYS ER 20 MEQ PO TBCR   Oral   Take 20 mEq by mouth daily.             BP 124/62  Pulse 67  Temp 98.3 F (36.8 C) (Oral)  Resp 16  SpO2 100%  Physical Exam  Nursing note and vitals reviewed. Constitutional: He is oriented to person, place, and time. He appears well-developed.       Elderly, frail, obese  HENT:  Head: Normocephalic and atraumatic.  Right Ear: External ear normal.  Left Ear: External ear normal.  Eyes: Conjunctivae normal and EOM are normal. Pupils are equal, round, and reactive to light.  Neck: Normal range of motion and phonation normal. Neck supple.  Cardiovascular: Normal rate, regular rhythm, normal heart sounds and intact distal pulses.   Pulmonary/Chest: Effort normal and breath sounds normal. No respiratory distress. He exhibits no bony tenderness.       Rales and decreased air movement, left base  Abdominal: Soft. Normal appearance. He exhibits distension (Mild). He exhibits no mass. There is no tenderness. There is no rebound and no guarding.       Midline surgical scar below the umbilicus  Musculoskeletal: Normal range of motion. He exhibits edema (mild edema. Left leg. No calf tenderness of either leg).  Neurological: He is alert and oriented to person, place, and time. He has normal strength. No cranial nerve deficit or sensory deficit. He exhibits normal muscle tone. Coordination normal.  Skin: Skin is warm, dry and intact.  Psychiatric: He has a normal mood and affect. His behavior is normal. Judgment and thought content normal.    ED Course  Procedures (including critical care time)      Date: 06/21/2012  Rate: 87  Rhythm: normal sinus rhythm  QRS Axis: normal  PR and QT Intervals: normal  ST/T Wave abnormalities: normal  PR and QRS Conduction Disutrbances:none  Narrative Interpretation:   Old EKG Reviewed: unchanged      Labs Reviewed  CBC WITH DIFFERENTIAL - Abnormal; Notable for the following:    RBC 3.16 (*)     Hemoglobin 8.1 (*)     HCT 24.6 (*)     MCV 77.8 (*)     MCH 25.6 (*)     RDW  16.1 (*)     All other components within normal limits  COMPREHENSIVE METABOLIC  PANEL - Abnormal; Notable for the following:    Potassium 3.4 (*)     Glucose, Bld 116 (*)     Albumin 2.9 (*)     Alkaline Phosphatase 138 (*)     Total Bilirubin 0.1 (*)     GFR calc non Af Amer 62 (*)     GFR calc Af Amer 72 (*)     All other components within normal limits  LIPASE, BLOOD - Abnormal; Notable for the following:    Lipase 60 (*)     All other components within normal limits  URINALYSIS, ROUTINE W REFLEX MICROSCOPIC  URINE CULTURE   Dg Abd Acute W/chest  07/29/2012  *RADIOLOGY REPORT*  Clinical Data: Abdominal distention.  ACUTE ABDOMEN SERIES (ABDOMEN 2 VIEW & CHEST 1 VIEW)  Comparison: 04/22/2012.  Findings: Stool is seen throughout the colon.  No small bowel dilatation.  IMPRESSION: Severe constipation.   Original Report Authenticated By: Leanna Battles, M.D.      1. Hiccups   2. Constipation       MDM  Hiccups, ongoing. Screening evaluation for cardiac, pulmonary, and abdominal processes indicates only constipation.    Plan: Home Medications- Miralax; Home Treatments- fluids, rest; Recommended follow up- PCP or GI prn    Flint Melter, MD 07/29/12 2323

## 2012-07-30 LAB — URINE CULTURE

## 2012-08-23 ENCOUNTER — Telehealth: Payer: Self-pay | Admitting: Gastroenterology

## 2012-08-23 NOTE — Telephone Encounter (Signed)
Records reviewed & Physician has declined. Left Message for pt to call back so we can advice him.

## 2012-08-27 ENCOUNTER — Inpatient Hospital Stay (HOSPITAL_COMMUNITY)
Admission: EM | Admit: 2012-08-27 | Discharge: 2012-08-30 | DRG: 378 | Disposition: A | Discharge status: Home / Self Care | Payer: Medicare PPO | Attending: Internal Medicine | Admitting: Internal Medicine

## 2012-08-27 ENCOUNTER — Encounter (HOSPITAL_COMMUNITY)
Admission: EM | Disposition: A | Discharge status: Home / Self Care | Payer: Self-pay | Source: Home / Self Care | Attending: Internal Medicine

## 2012-08-27 ENCOUNTER — Emergency Department (HOSPITAL_COMMUNITY): Payer: Medicare PPO

## 2012-08-27 ENCOUNTER — Encounter (HOSPITAL_COMMUNITY): Payer: Self-pay | Admitting: Radiology

## 2012-08-27 DIAGNOSIS — K920 Hematemesis: Secondary | ICD-10-CM | POA: Diagnosis present

## 2012-08-27 DIAGNOSIS — D72829 Elevated white blood cell count, unspecified: Secondary | ICD-10-CM

## 2012-08-27 DIAGNOSIS — R55 Syncope and collapse: Secondary | ICD-10-CM | POA: Diagnosis present

## 2012-08-27 DIAGNOSIS — R6510 Systemic inflammatory response syndrome (SIRS) of non-infectious origin without acute organ dysfunction: Secondary | ICD-10-CM | POA: Diagnosis present

## 2012-08-27 DIAGNOSIS — K2961 Other gastritis with bleeding: Principal | ICD-10-CM | POA: Diagnosis present

## 2012-08-27 DIAGNOSIS — R066 Hiccough: Secondary | ICD-10-CM | POA: Diagnosis present

## 2012-08-27 DIAGNOSIS — E876 Hypokalemia: Secondary | ICD-10-CM | POA: Diagnosis present

## 2012-08-27 DIAGNOSIS — K449 Diaphragmatic hernia without obstruction or gangrene: Secondary | ICD-10-CM | POA: Diagnosis present

## 2012-08-27 DIAGNOSIS — K922 Gastrointestinal hemorrhage, unspecified: Secondary | ICD-10-CM

## 2012-08-27 DIAGNOSIS — Z79899 Other long term (current) drug therapy: Secondary | ICD-10-CM

## 2012-08-27 DIAGNOSIS — D62 Acute posthemorrhagic anemia: Secondary | ICD-10-CM | POA: Diagnosis present

## 2012-08-27 DIAGNOSIS — I10 Essential (primary) hypertension: Secondary | ICD-10-CM | POA: Diagnosis present

## 2012-08-27 DIAGNOSIS — K219 Gastro-esophageal reflux disease without esophagitis: Secondary | ICD-10-CM | POA: Diagnosis present

## 2012-08-27 DIAGNOSIS — D500 Iron deficiency anemia secondary to blood loss (chronic): Secondary | ICD-10-CM | POA: Diagnosis present

## 2012-08-27 DIAGNOSIS — Z7982 Long term (current) use of aspirin: Secondary | ICD-10-CM

## 2012-08-27 DIAGNOSIS — E039 Hypothyroidism, unspecified: Secondary | ICD-10-CM | POA: Diagnosis present

## 2012-08-27 HISTORY — PX: ESOPHAGOGASTRODUODENOSCOPY: SHX5428

## 2012-08-27 LAB — CBC
HCT: 24.6 % — ABNORMAL LOW (ref 39.0–52.0)
Hemoglobin: 7.8 g/dL — ABNORMAL LOW (ref 13.0–17.0)
Hemoglobin: 6.9 g/dL — CL (ref 13.0–17.0)
MCH: 24 pg — ABNORMAL LOW (ref 26.0–34.0)
MCV: 76 fL — ABNORMAL LOW (ref 78.0–100.0)
Platelets: 282 10*3/uL (ref 150–400)
RBC: 3.21 MIL/uL — ABNORMAL LOW (ref 4.22–5.81)
RBC: 2.87 MIL/uL — ABNORMAL LOW (ref 4.22–5.81)
WBC: 15.5 10*3/uL — ABNORMAL HIGH (ref 4.0–10.5)
WBC: 12.7 10*3/uL — ABNORMAL HIGH (ref 4.0–10.5)

## 2012-08-27 LAB — ABO/RH: ABO/RH(D): O POS

## 2012-08-27 LAB — URINE MICROSCOPIC-ADD ON

## 2012-08-27 LAB — PROTIME-INR: Prothrombin Time: 14 seconds (ref 11.6–15.2)

## 2012-08-27 LAB — URINALYSIS, ROUTINE W REFLEX MICROSCOPIC
Bilirubin Urine: NEGATIVE
Hgb urine dipstick: NEGATIVE
Nitrite: NEGATIVE
Specific Gravity, Urine: 1.024 (ref 1.005–1.030)
Urobilinogen, UA: 0.2 mg/dL (ref 0.0–1.0)
pH: 5 (ref 5.0–8.0)

## 2012-08-27 LAB — COMPREHENSIVE METABOLIC PANEL
ALT: 8 U/L (ref 0–53)
AST: 10 U/L (ref 0–37)
CO2: 27 mEq/L (ref 19–32)
Calcium: 8.6 mg/dL (ref 8.4–10.5)
Chloride: 100 mEq/L (ref 96–112)
Creatinine, Ser: 1.11 mg/dL (ref 0.50–1.35)
Glucose, Bld: 181 mg/dL — ABNORMAL HIGH (ref 70–99)
Potassium: 3 mEq/L — ABNORMAL LOW (ref 3.5–5.1)
Total Protein: 6.3 g/dL (ref 6.0–8.3)

## 2012-08-27 LAB — POCT I-STAT, CHEM 8
Chloride: 100 mEq/L (ref 96–112)
Creatinine, Ser: 1 mg/dL (ref 0.50–1.35)
Glucose, Bld: 173 mg/dL — ABNORMAL HIGH (ref 70–99)
Potassium: 3.1 mEq/L — ABNORMAL LOW (ref 3.5–5.1)
Sodium: 140 mEq/L (ref 135–145)

## 2012-08-27 LAB — FERRITIN: Ferritin: 8 ng/mL — ABNORMAL LOW (ref 22–322)

## 2012-08-27 LAB — IRON AND TIBC: TIBC: 286 ug/dL (ref 215–435)

## 2012-08-27 LAB — LACTIC ACID, PLASMA: Lactic Acid, Venous: 1.1 mmol/L (ref 0.5–2.2)

## 2012-08-27 SURGERY — EGD (ESOPHAGOGASTRODUODENOSCOPY)
Anesthesia: Moderate Sedation

## 2012-08-27 MED ORDER — PANTOPRAZOLE SODIUM 40 MG IV SOLR
40.0000 mg | Freq: Two times a day (BID) | INTRAVENOUS | Status: DC
  Administered 2012-08-28 – 2012-08-29 (×4): 40 mg via INTRAVENOUS
  Filled 2012-08-27 (×6): qty 40

## 2012-08-27 MED ORDER — POTASSIUM CHLORIDE 10 MEQ/100ML IV SOLN
INTRAVENOUS | Status: AC
  Filled 2012-08-27: qty 300

## 2012-08-27 MED ORDER — ONDANSETRON HCL 4 MG/2ML IJ SOLN
4.0000 mg | Freq: Four times a day (QID) | INTRAMUSCULAR | Status: DC | PRN
  Administered 2012-08-27: 4 mg via INTRAVENOUS
  Filled 2012-08-27 (×2): qty 2

## 2012-08-27 MED ORDER — SODIUM CHLORIDE 0.9 % IV BOLUS (SEPSIS)
500.0000 mL | Freq: Once | INTRAVENOUS | Status: DC
  Administered 2012-08-27: 500 mL via INTRAVENOUS

## 2012-08-27 MED ORDER — SODIUM CHLORIDE 0.9 % IJ SOLN
3.0000 mL | Freq: Two times a day (BID) | INTRAMUSCULAR | Status: DC
  Administered 2012-08-28 – 2012-08-30 (×5): 3 mL via INTRAVENOUS

## 2012-08-27 MED ORDER — CHLORPROMAZINE HCL 25 MG PO TABS
25.0000 mg | ORAL_TABLET | Freq: Once | ORAL | Status: AC
  Administered 2012-08-27: 25 mg via ORAL
  Filled 2012-08-27: qty 1

## 2012-08-27 MED ORDER — POTASSIUM CHLORIDE 10 MEQ/100ML IV SOLN
10.0000 meq | INTRAVENOUS | Status: AC
  Administered 2012-08-27 (×3): 10 meq via INTRAVENOUS

## 2012-08-27 MED ORDER — LEVOTHYROXINE SODIUM 125 MCG PO TABS: 125.0000 ug | ORAL_TABLET | Freq: Every day | ORAL | Status: DC

## 2012-08-27 MED ORDER — LEVOTHYROXINE SODIUM 125 MCG PO TABS
125.0000 ug | ORAL_TABLET | Freq: Every day | ORAL | Status: DC
  Administered 2012-08-28 – 2012-08-30 (×3): 125 ug via ORAL
  Filled 2012-08-27 (×5): qty 1

## 2012-08-27 MED ORDER — POTASSIUM CHLORIDE IN NACL 40-0.9 MEQ/L-% IV SOLN
INTRAVENOUS | Status: DC
  Administered 2012-08-28: 08:00:00 via INTRAVENOUS
  Filled 2012-08-27 (×4): qty 1000

## 2012-08-27 MED ORDER — POTASSIUM CHLORIDE CRYS ER 20 MEQ PO TBCR: 20.0000 meq | EXTENDED_RELEASE_TABLET | Freq: Every day | ORAL | Status: DC

## 2012-08-27 MED ORDER — BUTAMBEN-TETRACAINE-BENZOCAINE 2-2-14 % EX AERO
INHALATION_SPRAY | CUTANEOUS | Status: DC | PRN
  Administered 2012-08-27: 2 via TOPICAL

## 2012-08-27 MED ORDER — FENTANYL CITRATE 0.05 MG/ML IJ SOLN
INTRAMUSCULAR | Status: AC
  Filled 2012-08-27: qty 2

## 2012-08-27 MED ORDER — FENTANYL CITRATE 0.05 MG/ML IJ SOLN
INTRAMUSCULAR | Status: DC | PRN
  Administered 2012-08-27: 25 ug via INTRAVENOUS

## 2012-08-27 MED ORDER — SODIUM CHLORIDE 0.9 % IV SOLN
INTRAVENOUS | Status: AC
  Administered 2012-08-27: 12:00:00 via INTRAVENOUS

## 2012-08-27 MED ORDER — ONDANSETRON HCL 4 MG PO TABS
4.0000 mg | ORAL_TABLET | Freq: Four times a day (QID) | ORAL | Status: DC | PRN
  Administered 2012-08-30: 4 mg via ORAL
  Filled 2012-08-27: qty 1

## 2012-08-27 MED ORDER — MIDAZOLAM HCL 5 MG/ML IJ SOLN
INTRAMUSCULAR | Status: AC
  Filled 2012-08-27: qty 2

## 2012-08-27 MED ORDER — MIDAZOLAM HCL 10 MG/2ML IJ SOLN
INTRAMUSCULAR | Status: DC | PRN
  Administered 2012-08-27: 2 mg via INTRAVENOUS

## 2012-08-27 MED ORDER — PANTOPRAZOLE SODIUM 40 MG IV SOLR
80.0000 mg | Freq: Once | INTRAVENOUS | Status: AC
  Administered 2012-08-27: 80 mg via INTRAVENOUS
  Filled 2012-08-27: qty 80

## 2012-08-27 MED ORDER — SODIUM CHLORIDE 0.9 % IV SOLN
12.5000 mg | Freq: Three times a day (TID) | INTRAVENOUS | Status: DC | PRN
  Filled 2012-08-27: qty 0.5

## 2012-08-27 NOTE — ED Notes (Signed)
Per EMS pt from home.  Pt was walking to bathroom and fell.  Pt denies any injuries to self.  EMS states the bp was 80/40 first taken.  They started iv and pt was given 500ns bolus.

## 2012-08-27 NOTE — Progress Notes (Signed)
Utilization review completed.  

## 2012-08-27 NOTE — ED Notes (Signed)
Patient vomited large amount of coffee ground looking vomit.   Patient claims "feels better now".    Patient and room cleaned and linens changed.

## 2012-08-27 NOTE — Op Note (Signed)
Victor Hunter [facility] [address] [place], [address]   ENDOSCOPY PROCEDURE REPORT  PATIENT: Victor, Hunter  MR#: [id number] BIRTHDATE: [birth year] , 78  yrs. old GENDER: Male ENDOSCOPIST:Yalena Colon, MD REFERRED BY:  unassigned (patient of Dr. Augusto Gamble in [place]) PROCEDURE DATE:  08/27/2012 PROCEDURE:    esophagogastroduodenoscopy ASA CLASS: INDICATIONS:   coffee ground emesis, chronic anemia MEDICATION:    fentanyl 25 mcg, Versed 2 mg IV TOPICAL ANESTHETIC:  DESCRIPTION OF PROCEDURE:   the nature, purpose, and risks of the procedure been discussed with patient who provided written consent. The procedure was done at the bedside in the step down unit at [facility].  The Pentax adult video endoscope was passed under direct vision. The larynx looked somewhat boggy.  The esophagus was entered without difficulty and was normal in its entirety, without evidence of reflux esophagitis, Barrett's esophagus, varices, infection, neoplasia, or stricture. There was a medium-sized hiatal hernia present, and the gastric folds within the hiatal hernia had superficial erosive changes consistent with Sheria Lang lesions, but without any clear stigmata of hemorrhage.  The abdominal portion of the stomach was entered. It contained a few coffee grounds, but no blood. The remainder of the stomach was normal, although a retroflex view of the cardia showed again the [facility] erosions at the diaphragmatic hiatus, as seen from the inferior perspective.  The pylorus, duodenal bulb, and second duodenum looked normal.  The scope was removed from the patient. No biopsies were obtained. He tolerated the procedure well.     COMPLICATIONS: None  ENDOSCOPIC IMPRESSION:  1. No active bleeding at the time of this procedure 2. 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 3. The patient's chronic anemia might be related to the observed [facility] lesions  RECOMMENDATIONS:  1. Antipeptic therapy 2. Sheria Lang lesions are not specifically peptic in etiology, and thus, if ongoing anemia, unresponsive to iron supplementation, becomes a problem for this patient, consideration might be given to surgical repair of his hiatal hernia, although it is not particularly large.   _______________________________ eSigned:  Bernette Redbird, MD 08/27/2012 3:30 PM    PATIENT NAME:  Victor Hunter, Victor Hunter MR#: [id number]

## 2012-08-27 NOTE — Progress Notes (Addendum)
Pt arrived from the ED, VSS, lethargic but oriented. IV K started; Pt NPO for EGD bedside. Will continue to monitor.

## 2012-08-27 NOTE — ED Notes (Signed)
Report given to Amy, rn

## 2012-08-27 NOTE — Consult Note (Signed)
Referring Provider: Dr. Dorris Carnes. Dhungel Primary Care Physician:  Avon Gully, MD Primary Gastroenterologist:  Dr. Jena Gauss Sidney Ace)  Reason for Consultation:  Coffee ground emesis, anemia  HPI: Victor Hunter is a 78 y.o. male admitted through the emergency room today following a syncopal episode at home. During evaluation for that, he had coffee ground emesis x2, and it was noted that he had an elevated BUN of 46, and a low hemoglobin of 7.8, so we were asked to see the patient.   His past GI history includes a history of a Mallory-Weiss tear with a GI bleed several years ago.   He is on a daily aspirin, is not on PPI therapy.   He indicates he has had some recent dyspeptic symptoms, relieved by vomiting.  With respect to his anemia, it is noted that one month prior to admission, he had a hemoglobin of 8.1, with an MCV of 79.   Past Medical History  Diagnosis Date  . Hypertension   . GERD (gastroesophageal reflux disease)   . Thyroid disease   . Upper GI bleed   . Ileus   . Mallory - Weiss tear   . Chronic anemia   . Enlarged prostate     Elevated PSA  . 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 Opal Sidles benign biopsies February 2005  . History of BPH     s/p TURP 2008    Past Surgical History  Procedure Date  . Thyroidectomy   . Cholecystectomy   . Esophagogastroduodenoscopy 08/31/08    Rourk-possible extrinsic impression on midesophagus, noncritical Schatzki's ring, 3 mm fundal gland polyp, multiple gastric fundic polyps not manipulated, small hiatal hernia  . Colonoscopy 10/29/03    rourk-inflammatory polyp from 40 cm removed  . Knee arthroscopy     pt denies  . Transurethral resection of prostate     pt denies, but this is listed in his past medical records.   . Laparotomy 04/26/2012    Procedure: EXPLORATORY LAPAROTOMY;  Surgeon: Fabio Bering, MD;  Location: [facility];  Service: General;   Laterality: N/A;  . Laparotomy 04/26/2012    Procedure: EXPLORATORY LAPAROTOMY;  Surgeon: Fabio Bering, MD;  Location: [facility];  Service: General;  Laterality: N/A;  . Central venous catheter insertion 04/26/2012    Procedure: INSERTION CENTRAL LINE ADULT;  Surgeon: Fabio Bering, MD;  Location: [facility];  Service: General;  Laterality: Left;  started at 0919, ended at 0930,        subclavian    Prior to Admission medications   Medication Sig Start Date End Date Taking? Authorizing Provider  aspirin EC 81 MG tablet Take 81 mg by mouth daily.   Yes Historical Provider, MD  calcium carbonate (TUMS - DOSED IN MG ELEMENTAL CALCIUM) 500 MG chewable tablet Chew 2 tablets by mouth daily as needed. Acid Reflux   Yes Historical Provider, MD  chlorproMAZINE (THORAZINE) 10 MG tablet Take 1 tablet (10 mg total) by mouth 3 (three) times daily. 07/25/12  Yes Cheri Guppy, MD  diltiazem (DILACOR XR) 180 MG 24 hr capsule Take 180 mg by mouth daily.   Yes Historical Provider, MD  hydrochlorothiazide (HYDRODIURIL) 25 MG tablet Take 25 mg by mouth daily.   Yes Historical Provider, MD  levothyroxine (SYNTHROID, LEVOTHROID) 125 MCG tablet Take 125 mcg by mouth daily.   Yes Historical Provider, MD  potassium chloride SA (K-DUR,KLOR-CON) 20 MEQ tablet Take 20 mEq  by mouth daily.   Yes Historical Provider, MD  Tamsulosin HCl (FLOMAX) 0.4 MG CAPS Take 0.4 mg by mouth daily.   Yes Historical Provider, MD    Current Facility-Administered Medications  Medication Dose Route Frequency Provider Last Rate Last Dose  . 0.9 %  sodium chloride infusion   Intravenous STAT Chionesu Lytle Michaels, MD 100 mL/hr at 08/27/12 1215    . 0.9 % NaCl with KCl 40 mEq / L  infusion   Intravenous Continuous Nishant Dhungel, MD      . butamben-tetracaine-benzocaine (CETACAINE) spray    PRN Florencia Reasons, MD   2 spray at 08/27/12 1328  . chlorproMAZINE (THORAZINE) 12.5 mg in sodium chloride 0.9 % 25 mL IVPB  12.5 mg Intravenous Q8H PRN  Nishant Dhungel, MD      . fentaNYL (SUBLIMAZE) injection    PRN Florencia Reasons, MD   25 mcg at 08/27/12 1328  . levothyroxine (SYNTHROID, LEVOTHROID) tablet 125 mcg  125 mcg Oral QAC breakfast Nishant Dhungel, MD      . midazolam (VERSED) injection    PRN Florencia Reasons, MD   2 mg at 08/27/12 1328  . ondansetron (ZOFRAN) tablet 4 mg  4 mg Oral Q6H PRN Nishant Dhungel, MD       Or  . ondansetron (ZOFRAN) injection 4 mg  4 mg Intravenous Q6H PRN Nishant Dhungel, MD   4 mg at 08/27/12 1216  . potassium chloride 10 mEq in 100 mL IVPB  10 mEq Intravenous Q1 Hr x 3 Nishant Dhungel, MD   10 mEq at 08/27/12 1216  . potassium chloride 10 MEQ/100ML IVPB           . sodium chloride 0.9 % injection 3 mL  3 mL Intravenous Q12H Nishant Dhungel, MD        Allergies as of 08/27/2012  . (No Known Allergies)    Family History  Problem Relation Age of Onset  . Cancer Other   . Heart failure Other   . Liver disease Sister   . Breast cancer Sister   . Hypertension Mother   . Stroke Father   . Colon cancer Neg Hx     History   Social History  . Marital Status: Married    Spouse Name: N/A    Number of Children: 4  . Years of Education: N/A   Occupational History  . retired [employer] architect    Social History Main Topics  . Smoking status: Former Smoker -- 0.5 packs/day    Types: Cigarettes  . Smokeless tobacco: Never Used  . Alcohol Use: No  . Drug Use: No  . Sexually Active: No   Other Topics Concern  . Not on file   Social History Narrative  . No narrative on file    Review of Systems: Positive for: Recent dyspeptic symptoms as noted.  Physical Exam: Vital signs in last 24 hours: Temp:  [98.5 F (36.9 C)] 98.5 F (36.9 C) (12/24 0532) Pulse Rate:  [98-132] 106  (12/24 1340) Resp:  [16-24] 17  (12/24 1340) BP: (70-132)/(21-83) 73/24 mmHg (12/24 1340) SpO2:  [97 %-100 %] 100 % (12/24 1340) Weight:  [79.379 kg (175 lb)] 79.379 kg (175 lb) (12/24 0532)   General:  Somunlent  following thorazine;  Well-developed, well-nourished, pleasant and cooperative in NAD Head:  Normocephalic and atraumatic. Eyes:  Sclera clear, no icterus.    Mouth:   No ulcerations or lesions.  Oropharynx pink but dry.  Lungs:  Clear  throughout to auscultation.   No wheezes, crackles, or rhonchi. No evident respiratory distress. Heart:   Regular rate and rhythm; no murmurs, clicks, rubs,  or gallops. Abdomen:  Slightly firm, nontender, slightly protuberant. No masses, hepatosplenomegaly or ventral hernias noted. Quiet bowel sounds, without bruits, guarding, or rebound or succussion splash.   Rectal:  Enlarged prostate, no masses, liquid light brown stool residue with no evidence of melena or visible blood   Msk:   Symmetrical without gross deformities. Neurologic: Somnolent, as noted above ;  grossly normal neurologically. Psych:   Alert and cooperative. Normal mood and affect.  Intake/Output from previous day:   Intake/Output this shift:    Lab Results:  Basename 08/27/12 0815 08/27/12 0743  WBC -- 15.5*  HGB 8.8* 7.8*  HCT 26.0* 24.6*  PLT -- 314   BMET  Basename 08/27/12 0815 08/27/12 0743  NA 140 138  K 3.1* 3.0*  CL 100 100  CO2 -- 27  GLUCOSE 173* 181*  BUN 46* 46*  CREATININE 1.00 1.11  CALCIUM -- 8.6   LFT  Basename 08/27/12 0743  PROT 6.3  ALBUMIN 3.0*  AST 10  ALT 8  ALKPHOS 130*  BILITOT 0.1*  BILIDIR --  IBILI --   PT/INR  Basename 08/27/12 0804  LABPROT 14.0  INR 1.09     Studies/Results: Dg Chest 1 View  08/27/2012  *RADIOLOGY REPORT*  Clinical Data: Fall  CHEST - 1 VIEW  Comparison: Acute abdominal series 07/29/2012  Findings: Heart and mediastinal contours are stable.  The lungs are clear. No visible pleural effusion.  There are surgical clips at the thoracic inlet bilaterally.  There is a moderate convex right scoliosis of the thoracic spine, stable.  Cholecystectomy clips in the right upper quadrant.  IMPRESSION: No acute cardiopulmonary  disease.   Original Report Authenticated By: Britta Mccreedy, M.D.    Ct Head Wo Contrast  08/27/2012  *RADIOLOGY REPORT*  Clinical Data: Status post fall; concern for head injury. Hypotension.  CT HEAD WITHOUT CONTRAST  Technique:  Contiguous axial images were obtained from the base of the skull through the vertex without contrast.  Comparison: PET / CT performed 09/16/2008  Findings: There is no evidence of acute infarction, mass lesion, or intra- or extra-axial hemorrhage on CT.  Mild prominence of the ventricles may reflect minimal cortical volume loss.  The brainstem and fourth ventricle are within normal limits.  The basal ganglia are unremarkable in appearance.  The cerebral hemispheres demonstrate grossly normal gray-white differentiation. No mass effect or midline shift is seen.  There is no evidence of fracture; visualized osseous structures are unremarkable in appearance.  The visualized portions of the orbits are within normal limits.  The paranasal sinuses and mastoid air cells are well-aerated.  No significant soft tissue abnormalities are seen.  IMPRESSION: No evidence of traumatic intracranial injury or fracture.   Original Report Authenticated By: Tonia Ghent, M.D.     Impression: 1. Coffee-ground emesis 2. Syncope, etiology not clear. The patient's low hemoglobin is a chronic finding and thus would not readily explained as syncope. Moreover, there has not been enough blood loss observed from either end, to readily explained syncope on the basis of GI bleeding. 3. Chronic microcytic anemia, up-to-date on screening colonoscopy 4.New onset elevated BUN, suggestive of upper GI bleed 5. History of Mallory-Weiss tear  Plan: 1. Endoscopic evaluation. Ashby Dawes, purpose, risks reviewed, patient agreeable. Further management for GI bleeding to depend on endoscopic findings. 2. Iron studies to see if this  is iron deficiency anemia, in which case updated colonoscopy may need to be performed, which  could probably be accomplished in the outpatient setting by his primary gastroenterologist.   LOS: 0 days   Jesly Hartmann V  08/27/2012, 1:47 PM

## 2012-08-27 NOTE — ED Notes (Signed)
Patient back from radiology.  Patient claims "i must have passed out or something" in radiology.  Patient advised feels fine at this time.   Patient lethargic.

## 2012-08-27 NOTE — ED Provider Notes (Addendum)
History     CSN: [id number]  Arrival date & time 08/27/12  4696   First MD Initiated Contact with Patient 08/27/12 [phone]      Chief Complaint  Patient presents with  . Fall    (Consider location/radiation/quality/duration/timing/severity/associated sxs/prior treatment) Patient is a 78 y.o. male presenting with fall. The history is provided by the patient.  Fall The accident occurred less than 1 hour ago. The fall occurred from a bed. He landed on a hard floor. There was no blood loss. The point of impact was the head. The pain is mild.    Past Medical History  Diagnosis Date  . Hypertension   . GERD (gastroesophageal reflux disease)   . Thyroid disease   . Upper GI bleed   . Ileus   . Mallory - Weiss tear   . Chronic anemia   . Enlarged prostate     Elevated PSA  . 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 Opal Sidles benign biopsies February 2005  . History of BPH     s/p TURP 2008    Past Surgical History  Procedure Date  . Thyroidectomy   . Cholecystectomy   . Esophagogastroduodenoscopy 08/31/08    Rourk-possible extrinsic impression on midesophagus, noncritical Schatzki's ring, 3 mm fundal gland polyp, multiple gastric fundic polyps not manipulated, small hiatal hernia  . Colonoscopy 10/29/03    rourk-inflammatory polyp from 40 cm removed  . Knee arthroscopy     pt denies  . Transurethral resection of prostate     pt denies, but this is listed in his past medical records.   . Laparotomy 04/26/2012    Procedure: EXPLORATORY LAPAROTOMY;  Surgeon: Fabio Bering, MD;  Location: [facility];  Service: General;  Laterality: N/A;  . Laparotomy 04/26/2012    Procedure: EXPLORATORY LAPAROTOMY;  Surgeon: Fabio Bering, MD;  Location: [facility];  Service: General;  Laterality: N/A;  . Central venous catheter insertion 04/26/2012    Procedure: INSERTION CENTRAL LINE ADULT;  Surgeon: Fabio Bering, MD;   Location: [facility];  Service: General;  Laterality: Left;  started at 0919, ended at 0930,        subclavian    Family History  Problem Relation Age of Onset  . Cancer Other   . Heart failure Other   . Liver disease Sister   . Breast cancer Sister   . Hypertension Mother   . Stroke Father   . Colon cancer Neg Hx     History  Substance Use Topics  . Smoking status: Former Smoker -- 0.5 packs/day    Types: Cigarettes  . Smokeless tobacco: Never Used  . Alcohol Use: No      Review of Systems  All other systems reviewed and are negative.    Allergies  Review of patient's allergies indicates no known allergies.  Home Medications   Current Outpatient Rx  Name  Route  Sig  Dispense  Refill  . ASPIRIN EC 81 MG PO TBEC   Oral   Take 81 mg by mouth daily.         Marland Kitchen CALCIUM CARBONATE ANTACID 500 MG PO CHEW   Oral   Chew 2 tablets by mouth daily as needed. Acid Reflux         . CHLORPROMAZINE HCL 10 MG PO TABS   Oral   Take 1 tablet (10 mg total) by mouth 3 (three) times daily.  15 tablet   0   . DILTIAZEM HCL ER 180 MG PO CP24   Oral   Take 180 mg by mouth daily.         Marland Kitchen HYDROCHLOROTHIAZIDE 25 MG PO TABS   Oral   Take 25 mg by mouth daily.         Marland Kitchen LEVOTHYROXINE SODIUM 125 MCG PO TABS   Oral   Take 125 mcg by mouth daily.         Marland Kitchen POTASSIUM CHLORIDE CRYS ER 20 MEQ PO TBCR   Oral   Take 20 mEq by mouth daily.         Marland Kitchen TAMSULOSIN HCL 0.4 MG PO CAPS   Oral   Take 0.4 mg by mouth daily.           BP 82/50  Pulse 132  Temp 98.5 F (36.9 C) (Oral)  Resp 16  Ht 5\' 3"  (1.6 m)  Wt 175 lb (79.379 kg)  BMI 31.00 kg/m2  SpO2 97%  Physical Exam  Constitutional: He is oriented to person, place, and time. He appears well-developed and well-nourished.  HENT:  Head: Normocephalic and atraumatic.       + reccurent hiccup  Eyes: Conjunctivae normal are normal. Pupils are equal, round, and reactive to light.  Neck: Normal range of motion.  Neck supple.  Cardiovascular: Normal rate, regular rhythm, normal heart sounds and intact distal pulses.   Pulmonary/Chest: Effort normal and breath sounds normal.  Abdominal: Soft. Bowel sounds are normal.  Neurological: He is alert and oriented to person, place, and time.  Skin: Skin is warm and dry.  Psychiatric: He has a normal mood and affect. His behavior is normal. Judgment and thought content normal.    ED Course  Procedures (including critical care time)   Labs Reviewed  CBC  COMPREHENSIVE METABOLIC PANEL  URINALYSIS, ROUTINE W REFLEX MICROSCOPIC   No results found.   No diagnosis found.   CRITICAL CARE Performed by: Rosanne Ashing   Total critical care time:  Critical care time was exclusive of separately billable procedures and treating other patients.  Critical care was necessary to treat or prevent imminent or life-threatening deterioration.  Critical care was time spent personally by me on the following activities: development of treatment plan with patient and/or surrogate as well as nursing, discussions with consultants, evaluation of patient's response to treatment, examination of patient, obtaining history from patient or surrogate, ordering and performing treatments and interventions, ordering and review of laboratory studies, ordering and review of radiographic studies, pulse oximetry and re-evaluation of patient's condition.   Date: 08/27/2012  Rate: 103  Rhythm: sinus tachycardia  QRS Axis: normal  Intervals: normal  ST/T Wave abnormalities: normal  Conduction Disutrbances: none  Narrative Interpretation: unremarkable    MDM  + syncope.  Recurrent hiccups,  On thorazine with oupt workup in past.  Will lab,  Ct,  reassess  + recurrent syncope in ed.  Coffee ground emesis,  Hypotensive in ed.  Improved with ivf.  Bp improved.  Discussed wit critical care,  States does not think appropriate for them at this time.  Discussed with gi,  hospitalist,  State will admit.      Teirra Carapia Lytle Michaels, MD 08/27/12 8119  Rosanne Ashing, MD 08/27/12 1478  Knox Royalty Lytle Michaels, MD 08/27/12 2956

## 2012-08-27 NOTE — ED Notes (Signed)
Tried to call report to floor.  Spoke with Eulah Citizen, RN - she advised that she couldn't take report "we are in huddle now".  Eulah Citizen to call back when huddle is over.

## 2012-08-27 NOTE — Progress Notes (Signed)
During an episode of coughing pt HR dropped into the 30's non-sustaining. HR now at 110 sinus tachy, SBP 103/61. NP Schorr aware and no new orders.

## 2012-08-27 NOTE — ED Notes (Signed)
Lactic acid shown to attending MD/PA-C

## 2012-08-27 NOTE — H&P (Signed)
[facility] History and Physical  BEVAN DISNEY AOZ:[id number] DOB: 18-Nov-1933 DOA: 08/27/2012  Referring physician: Dr Verl Bangs PCP: Avon Gully, MD   Chief Complaint: fell at home  HPI:  78 year old male with a history of hypertension, hypothyroidism, chronic anemia, chronic hiccups, BPH( s/p TURP), history of GERD with upper GI bleed and Mallory-Weiss tear in 2005 with findings of esophageal erosions on EGD done at that time, history of ileus with no significant finding on exploratory  laparotomy few months back had a syncopal episode at home this morning. Patient was getting up to go to the bathroom when he asked out and does not remember the incident. He was found to be on the floor by a family member and the episode of syncope was brief lasting for seconds. He did not sustain any head injury. Patient denies any dizziness or blurry vision. He was however having persistent hiccups. In the ED patient was found to be hypotensive with systolic blood pressure in 80s and was also orthostatic. Patient was sent for head CT and had 2 episodes of coffee-ground emesis of significant amount as per the ED physician. Patient was placed on Protonix drip and given a dose of Thorazine for his checkups. Patient was also placed on IV fluids for low blood pressure. An EKG  was normal except for sinus tachycardia. Head CT was unremarkable. Labs showed leukocytosis with WBC of 15,000, anemia with hemoglobin of 7.8, hypo-kalemia and an elevated lactate of 3. Patient denies any abdominal pain or nausea at this time. He denies any headache, blurry vision, dizziness, generalized weakness, chest pain, palpitations, shortness of breath, bowel or urinary symptoms. He denies any sick contacts or eating anything outside. He denies similar symptoms in the past. [facility] called for admission to step down monitoring for picture of SIRS with hematemesis and low blood pressure. [facility] was also consulted from the  ED.  Review of Systems:  Constitutional: Denies fever, chills, diaphoresis, appetite change and fatigue.  HEENT: Denies photophobia, eye pain, redness, hearing loss, ear pain, congestion, sore throat, rhinorrhea, sneezing, mouth sores, trouble swallowing, neck pain, neck stiffness and tinnitus.   Respiratory: Denies SOB, DOE, cough, chest tightness,  and wheezing.   Cardiovascular: Denies chest pain, palpitations and leg swelling.  Gastrointestinal: nausea, vomiting,  Hematemesis,  Denies abdominal pain, diarrhea, constipation, blood in stool and abdominal distention.  Genitourinary: Denies dysuria, urgency, frequency, hematuria, flank pain and difficulty urinating.  Musculoskeletal: Denies myalgias, back pain, joint swelling, arthralgias and gait problem.  Skin: Denies pallor, rash and wound.  Neurological: Syncope, Denies dizziness, seizures,  weakness, light-headedness, numbness and headaches.  Hematological: Denies adenopathy. Easy bruising, personal or family bleeding history  Psychiatric/Behavioral: Denies suicidal ideation, mood changes, confusion, nervousness, sleep disturbance and agitation   Past Medical History  Diagnosis Date  . Hypertension   . GERD (gastroesophageal reflux disease)   . Thyroid disease   . Upper GI bleed   . Ileus   . Mallory - Weiss tear   . Chronic anemia   . Enlarged prostate     Elevated PSA  . 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 Opal Sidles benign biopsies February 2005  . History of BPH     s/p TURP 2008   Past Surgical History  Procedure Date  . Thyroidectomy   . Cholecystectomy   . Esophagogastroduodenoscopy 08/31/08    Rourk-possible extrinsic impression on midesophagus, noncritical Schatzki's ring, 3 mm fundal gland  polyp, multiple gastric fundic polyps not manipulated, small hiatal hernia  . Colonoscopy 10/29/03    rourk-inflammatory polyp from 40 cm removed  .  Knee arthroscopy     pt denies  . Transurethral resection of prostate     pt denies, but this is listed in his past medical records.   . Laparotomy 04/26/2012    Procedure: EXPLORATORY LAPAROTOMY;  Surgeon: Fabio Bering, MD;  Location: [facility];  Service: General;  Laterality: N/A;  . Laparotomy 04/26/2012    Procedure: EXPLORATORY LAPAROTOMY;  Surgeon: Fabio Bering, MD;  Location: [facility];  Service: General;  Laterality: N/A;  . Central venous catheter insertion 04/26/2012    Procedure: INSERTION CENTRAL LINE ADULT;  Surgeon: Fabio Bering, MD;  Location: [facility];  Service: General;  Laterality: Left;  started at 0919, ended at 0930,        subclavian   Social History:  reports that he has quit smoking. His smoking use included Cigarettes. He smoked .5 packs per day. He has never used smokeless tobacco. He reports that he does not drink alcohol or use illicit drugs.  No Known Allergies  Family History  Problem Relation Age of Onset  . Cancer Other   . Heart failure Other   . Liver disease Sister   . Breast cancer Sister   . Hypertension Mother   . Stroke Father   . Colon cancer Neg Hx     Prior to Admission medications   Medication Sig Start Date End Date Taking? Authorizing Provider  aspirin EC 81 MG tablet Take 81 mg by mouth daily.   Yes Historical Provider, MD  calcium carbonate (TUMS - DOSED IN MG ELEMENTAL CALCIUM) 500 MG chewable tablet Chew 2 tablets by mouth daily as needed. Acid Reflux   Yes Historical Provider, MD  chlorproMAZINE (THORAZINE) 10 MG tablet Take 1 tablet (10 mg total) by mouth 3 (three) times daily. 07/25/12  Yes Cheri Guppy, MD  diltiazem (DILACOR XR) 180 MG 24 hr capsule Take 180 mg by mouth daily.   Yes Historical Provider, MD  hydrochlorothiazide (HYDRODIURIL) 25 MG tablet Take 25 mg by mouth daily.   Yes Historical Provider, MD  levothyroxine (SYNTHROID, LEVOTHROID) 125 MCG tablet Take 125 mcg by mouth daily.   Yes Historical Provider, MD   potassium chloride SA (K-DUR,KLOR-CON) 20 MEQ tablet Take 20 mEq by mouth daily.   Yes Historical Provider, MD  Tamsulosin HCl (FLOMAX) 0.4 MG CAPS Take 0.4 mg by mouth daily.   Yes Historical Provider, MD    Physical Exam:  Filed Vitals:   08/27/12 0915 08/27/12 0930 08/27/12 0956 08/27/12 1000  BP: 105/64 108/83 108/59 118/63  Pulse: 102 100 98 99  Temp:      TempSrc:      Resp:      Height:      Weight:      SpO2: 99% 98% 99% 99%    Constitutional: Vital signs reviewed.  Patient is a well-developed and well-nourished in no acute distress and cooperative with exam. Appears sleepy but oriented x3 Head: Normocephalic and atraumatic Ear: TM normal bilaterally Mouth: no erythema or exudates, MMM Eyes: PERRL, EOMI, conjunctivae normal, No scleral icterus.  Neck: Supple, Trachea midline normal ROM, No JVD, mass, thyromegaly, or carotid bruit present.  Cardiovascular: S1 and S2 tachycardic, no MRG, pulses symmetric and intact bilaterally Pulmonary/Chest: CTAB, no wheezes, rales, or rhonchi Abdominal: Soft. Non-tender, non-distended, bowel sounds are normal, no masses, organomegaly, or guarding present.  GU: no CVA tenderness Musculoskeletal: No joint deformities, erythema, or stiffness, ROM full and no nontender Ext: no edema and no cyanosis, pulses palpable bilaterally (DP and PT) Hematology: no cervical, inginal, or axillary adenopathy.  Neurological: A&O x3, appears sleepy ,Strength is normal and symmetric bilaterally, cranial nerve II-XII are grossly intact, no focal motor deficit, sensory intact to light touch bilaterally.  Skin: Warm, dry and intact. No rash, cyanosis, or clubbing.  Psychiatric: Normal mood and affect. speech and behavior is normal. Judgment and thought content normal. Cognition and memory are normal.   Labs on Admission:  Basic Metabolic Panel:  Lab 08/27/12 2952 08/27/12 0743  NA 140 138  K 3.1* 3.0*  CL 100 100  CO2 -- 27  GLUCOSE 173* 181*  BUN 46*  46*  CREATININE 1.00 1.11  CALCIUM -- 8.6  MG -- --  PHOS -- --   Liver Function Tests:  Lab 08/27/12 0743  AST 10  ALT 8  ALKPHOS 130*  BILITOT 0.1*  PROT 6.3  ALBUMIN 3.0*   No results found for this basename: LIPASE:5,AMYLASE:5 in the last 168 hours No results found for this basename: AMMONIA:5 in the last 168 hours CBC:  Lab 08/27/12 0815 08/27/12 0743  WBC -- 15.5*  NEUTROABS -- --  HGB 8.8* 7.8*  HCT 26.0* 24.6*  MCV -- 76.6*  PLT -- 314   Cardiac Enzymes: No results found for this basename: CKTOTAL:5,CKMB:5,CKMBINDEX:5,TROPONINI:5 in the last 168 hours BNP: No components found with this basename: POCBNP:5 CBG: No results found for this basename: GLUCAP:5 in the last 168 hours  Radiological Exams on Admission: Dg Chest 1 View  08/27/2012  *RADIOLOGY REPORT*  Clinical Data: Fall  CHEST - 1 VIEW  Comparison: Acute abdominal series 07/29/2012  Findings: Heart and mediastinal contours are stable.  The lungs are clear. No visible pleural effusion.  There are surgical clips at the thoracic inlet bilaterally.  There is a moderate convex right scoliosis of the thoracic spine, stable.  Cholecystectomy clips in the right upper quadrant.  IMPRESSION: No acute cardiopulmonary disease.   Original Report Authenticated By: Britta Mccreedy, M.D.    Ct Head Wo Contrast  08/27/2012  *RADIOLOGY REPORT*  Clinical Data: Status post fall; concern for head injury. Hypotension.  CT HEAD WITHOUT CONTRAST  Technique:  Contiguous axial images were obtained from the base of the skull through the vertex without contrast.  Comparison: PET / CT performed 09/16/2008  Findings: There is no evidence of acute infarction, mass lesion, or intra- or extra-axial hemorrhage on CT.  Mild prominence of the ventricles may reflect minimal cortical volume loss.  The brainstem and fourth ventricle are within normal limits.  The basal ganglia are unremarkable in appearance.  The cerebral hemispheres demonstrate  grossly normal gray-white differentiation. No mass effect or midline shift is seen.  There is no evidence of fracture; visualized osseous structures are unremarkable in appearance.  The visualized portions of the orbits are within normal limits.  The paranasal sinuses and mastoid air cells are well-aerated.  No significant soft tissue abnormalities are seen.  IMPRESSION: No evidence of traumatic intracranial injury or fracture.   Original Report Authenticated By: Tonia Ghent, M.D.     EKG: Sinus tacy @107 , no ST-T changes  Assessment/Plan Principal Problem:  *SIRS (systemic inflammatory response syndrome) Recent has a low blood pressure, tachycardia mild tachypnea and leukocytosis with positive lactate meeting the criteria for SIRS. There is no clear source of infection to suggest his symptoms and could be  purely related to his hematemesis. -I will admit patient to step down for closer monitoring. Patient has already been started on Protonix drip which I will continue. We'll monitor serial H&H. -I will keep patient n.p.o. for now. Continue when necessary Thorazine for this hiccups. Monitor lactate level. She does not have any abdominal pain at this time. His lipase level is normal. Will hold off on any abdominal imaging at this time. -[facility] has already been consulted from the ED and we will followup with recommendation. Patient informs having a colonoscopy done within the past year at [facility] (No records in the system). -I. will order a blood culture. Hold antibiotics for now. Monitor WBC -Continue IV normal saline for hydration. Monitor orthostasis. Hold all blood pressure medications and Flomax.  Active Problems:  Hematemesis Patient placed on a Protonix drip and we'll continue. He denies use of NSAIDs as outpatient. -Hold aspirin, Flomax and blood pressure medications. Followup with GI recommendations.   Syncope and collapse Possibly vasovagal versus orthostatic. We'll closely  monitor. Head CT is negative. Monitor in  Telemetry   Hypokalemia Will replenish   DVT prophylaxis: SCD boots Diet: N.p.o. for now  Code Status: Full code Family Communication: Discussed with daughter at bedside Disposition Plan: Home once stable  Eddie North [facility] Pager [phone]  If 7PM-7AM, please contact night-coverage [email or web address] Password [facility] 08/27/2012, 10:31 AM  Total time spent on admission: 70 minutes

## 2012-08-27 NOTE — Interval H&P Note (Signed)
History and Physical Interval Note:  08/27/2012 1:22 PM  Victor Hunter  has presented today for surgery, with the diagnosis of Hematemesis  The various methods of treatment have been discussed with the patient. After consideration of risks, benefits and other options for treatment, the patient has consented to  Procedure(s) (LRB) with comments: ESOPHAGOGASTRODUODENOSCOPY (EGD) (N/A) - do at bedside in stepdown unit as a surgical intervention .  The patient's history has been reviewed, patient examined, no change in status, stable for surgery.  I have reviewed the patient's chart and labs.  Questions were answered to the patient's satisfaction.     Florencia Reasons

## 2012-08-27 NOTE — ED Notes (Signed)
Per rapid response, patient had a syncopal episode in radiology.

## 2012-08-27 NOTE — Progress Notes (Addendum)
[facility]ol[facility] admin by GI RN, d/c dup order  [facility] 6.7 notified DR

## 2012-08-28 DIAGNOSIS — K922 Gastrointestinal hemorrhage, unspecified: Secondary | ICD-10-CM

## 2012-08-28 DIAGNOSIS — D72829 Elevated white blood cell count, unspecified: Secondary | ICD-10-CM

## 2012-08-28 LAB — CBC
HCT: 22.2 % — ABNORMAL LOW (ref 39.0–52.0)
HCT: 29.1 % — ABNORMAL LOW (ref 39.0–52.0)
Hemoglobin: 7.3 g/dL — ABNORMAL LOW (ref 13.0–17.0)
Hemoglobin: 9.9 g/dL — ABNORMAL LOW (ref 13.0–17.0)
MCH: 26 pg (ref 26.0–34.0)
MCH: 27.2 pg (ref 26.0–34.0)
MCHC: 34 g/dL (ref 30.0–36.0)
MCV: 79 fL (ref 78.0–100.0)
MCV: 79.9 fL (ref 78.0–100.0)
Platelets: 234 10*3/uL (ref 150–400)
RBC: 2.81 MIL/uL — ABNORMAL LOW (ref 4.22–5.81)

## 2012-08-28 LAB — BASIC METABOLIC PANEL
BUN: 44 mg/dL — ABNORMAL HIGH (ref 6–23)
CO2: 23 mEq/L (ref 19–32)
GFR calc non Af Amer: 50 mL/min — ABNORMAL LOW (ref >90)
Glucose, Bld: 125 mg/dL — ABNORMAL HIGH (ref 70–99)
Potassium: 4 mEq/L (ref 3.5–5.1)

## 2012-08-28 MED ORDER — SODIUM CHLORIDE 0.9 % IJ SOLN
INTRAMUSCULAR | Status: AC
  Administered 2012-08-28: 10 mL
  Filled 2012-08-28: qty 10

## 2012-08-28 MED ORDER — SODIUM CHLORIDE 0.9 % IJ SOLN
INTRAMUSCULAR | Status: AC
  Administered 2012-08-28: 3 mL via INTRAVENOUS
  Filled 2012-08-28: qty 10

## 2012-08-28 MED ORDER — SODIUM CHLORIDE 0.9 % IV SOLN
250.0000 mg | Freq: Every day | INTRAVENOUS | Status: AC
  Administered 2012-08-28 – 2012-08-29 (×2): 250 mg via INTRAVENOUS
  Filled 2012-08-28 (×5): qty 20

## 2012-08-28 MED ORDER — CHLORPROMAZINE HCL 10 MG PO TABS
10.0000 mg | ORAL_TABLET | Freq: Three times a day (TID) | ORAL | Status: DC
  Administered 2012-08-28 – 2012-08-30 (×7): 10 mg via ORAL
  Filled 2012-08-28 (×9): qty 1

## 2012-08-28 MED ORDER — POTASSIUM CHLORIDE IN NACL 40-0.9 MEQ/L-% IV SOLN
INTRAVENOUS | Status: DC
  Filled 2012-08-28 (×2): qty 1000

## 2012-08-28 MED ORDER — SODIUM CHLORIDE 0.9 % IV SOLN
25.0000 mg | Freq: Once | INTRAVENOUS | Status: AC
  Administered 2012-08-28: 25 mg via INTRAVENOUS
  Filled 2012-08-28: qty 2

## 2012-08-28 MED ORDER — FERROUS SULFATE 325 (65 FE) MG PO TABS
325.0000 mg | ORAL_TABLET | Freq: Two times a day (BID) | ORAL | Status: DC
  Administered 2012-08-29 – 2012-08-30 (×3): 325 mg via ORAL
  Filled 2012-08-28 (×6): qty 1

## 2012-08-28 NOTE — Progress Notes (Signed)
[facility] PROGRESS NOTE  Victor Hunter ZOX:[id number] DOB: 08-28-1934 DOA: 08/27/2012 PCP: Avon Gully, MD  Brief narrative: 78 year old male with a history of hypertension, hypothyroidism, chronic anemia, chronic hiccups, BPH( s/p TURP), history of GERD with upper GI bleed and Mallory-Weiss tear in 2005 with findings of esophageal erosions on EGD done at that time, history of ileus with no significant finding on exploratory laparotomy few months back had a syncopal episode at home this morning. Patient was getting up to go to the bathroom when he asked out and does not remember the incident. He was found to be on the floor by a family member and the episode of syncope was brief lasting for seconds. He did not sustain any head injury. Patient denies any dizziness or blurry vision. He was however having persistent hiccups. In the ED patient was found to be hypotensive with systolic blood pressure in 80s and was also orthostatic. Patient was sent for head CT and had 2 episodes of coffee-ground emesis of significant amount as per the ED physician. Patient was placed on Protonix drip and given a dose of Thorazine for his checkups.  [facility] was also consulted from the ED.   Assessment/Plan: Principal Problem: Syncope and Collapse Due to blood loss  Hematemesis Pt was on ASA (no clear reason per pt and family) - also was recently on Coumadin for recent upper arm DVT- this had been stopped and he was NOT taking it prior to this admission Found to have hiatal hernia and cameron lesions on EGD today which are likely what bled- full liquids- monitor in SDU for further bleed.   *SIRS (systemic inflammatory response syndrome) Improving   Hypokalemia Replaced  Anemia- due to chronic blood loss over past few months and acute blood loss on admission/ iron deficiency due to chronic blood loss Transfuse 2 U PRBC and give IV and PO Iron  Mild leukocytosis May be due to stress, UA also  mildly positive - f/u cultures  Recent upper extremity DVT in August No longer needs coumadin as repeat doppler revealed resolution.   HTN Hold meds due to hypotension  Hiccups PRN Thorazine   Code Status: full code Family Communication: daughter Disposition Plan: follow in SDU DVT prophylaxis: SCDs   Consultants:  GI  Procedures:  EGD today  Antibiotics:  none  HPI/Subjective: Pt alert, has no complaints  Objective: Filed Vitals:   08/28/12 1320 08/28/12 1420 08/28/12 1440 08/28/12 1600  BP: 118/57 108/59 109/57 132/63  Pulse: 84 72 70 72  Temp: 98.5 F (36.9 C) 98.6 F (37 C) 98.4 F (36.9 C) 98.6 F (37 C)  TempSrc: Oral Oral Oral Oral  Resp: 20 13 13 20   Height:      Weight:      SpO2:    100%    Intake/Output Summary (Last 24 hours) at 08/28/12 1941 Last data filed at 08/28/12 1619  Gross per 24 hour  Intake 3055.5 ml  Output      0 ml  Net 3055.5 ml    Exam:   General:  Alert, oriented x 3  Cardiovascular: RRR, no murmurs  Respiratory: CTA b/l   Abdomen: soft NT, ND, BS+  Ext: no c/c/e  Data Reviewed: Basic Metabolic Panel:  Lab 08/28/12 8119 08/27/12 0815 08/27/12 0743  NA 138 140 138  K 4.0 3.1* 3.0*  CL 104 100 100  CO2 23 -- 27  GLUCOSE 125* 173* 181*  BUN 44* 46* 46*  CREATININE 1.33 1.00 1.11  CALCIUM 7.7* -- 8.6  MG -- -- --  PHOS -- -- --   Liver Function Tests:  Lab 08/27/12 0743  AST 10  ALT 8  ALKPHOS 130*  BILITOT 0.1*  PROT 6.3  ALBUMIN 3.0*   No results found for this basename: LIPASE:5,AMYLASE:5 in the last 168 hours No results found for this basename: AMMONIA:5 in the last 168 hours CBC:  Lab 08/28/12 1602 08/28/12 0822 08/27/12 1723 08/27/12 0815 08/27/12 0743  WBC 12.8* 12.7* 12.7* -- 15.5*  NEUTROABS -- -- -- -- --  HGB 9.9* 7.3* 6.9* 8.8* 7.8*  HCT 29.1* 22.2* 21.8* 26.0* 24.6*  MCV 79.9 79.0 76.0* -- 76.6*  PLT 189 234 282 -- 314   Cardiac Enzymes: No results found for this  basename: CKTOTAL:5,CKMB:5,CKMBINDEX:5,TROPONINI:5 in the last 168 hours BNP (last 3 results)  Basename 04/27/12 1420  PROBNP 132.0   CBG: No results found for this basename: GLUCAP:5 in the last 168 hours  Recent Results (from the past 240 hour(s))  CULTURE, BLOOD (ROUTINE X 2)     Status: Normal (Preliminary result)   Collection Time   08/27/12  2:45 PM      Component Value Range Status Comment   Specimen Description BLOOD RIGHT ARM   Final    Special Requests BOTTLES DRAWN AEROBIC ONLY 10CC   Final    Culture  Setup Time 08/27/2012 23:55   Final    Culture     Final    Value:        BLOOD CULTURE RECEIVED NO GROWTH TO DATE CULTURE WILL BE HELD FOR 5 DAYS BEFORE ISSUING A FINAL NEGATIVE REPORT   Report Status PENDING   Incomplete      Studies: Dg Chest 1 View  08/27/2012  *RADIOLOGY REPORT*  Clinical Data: Fall  CHEST - 1 VIEW  Comparison: Acute abdominal series 07/29/2012  Findings: Heart and mediastinal contours are stable.  The lungs are clear. No visible pleural effusion.  There are surgical clips at the thoracic inlet bilaterally.  There is a moderate convex right scoliosis of the thoracic spine, stable.  Cholecystectomy clips in the right upper quadrant.  IMPRESSION: No acute cardiopulmonary disease.   Original Report Authenticated By: Britta Mccreedy, M.D.    Ct Head Wo Contrast  08/27/2012  *RADIOLOGY REPORT*  Clinical Data: Status post fall; concern for head injury. Hypotension.  CT HEAD WITHOUT CONTRAST  Technique:  Contiguous axial images were obtained from the base of the skull through the vertex without contrast.  Comparison: PET / CT performed 09/16/2008  Findings: There is no evidence of acute infarction, mass lesion, or intra- or extra-axial hemorrhage on CT.  Mild prominence of the ventricles may reflect minimal cortical volume loss.  The brainstem and fourth ventricle are within normal limits.  The basal ganglia are unremarkable in appearance.  The cerebral hemispheres  demonstrate grossly normal gray-white differentiation. No mass effect or midline shift is seen.  There is no evidence of fracture; visualized osseous structures are unremarkable in appearance.  The visualized portions of the orbits are within normal limits.  The paranasal sinuses and mastoid air cells are well-aerated.  No significant soft tissue abnormalities are seen.  IMPRESSION: No evidence of traumatic intracranial injury or fracture.   Original Report Authenticated By: Tonia Ghent, M.D.     Scheduled Meds:   . chlorproMAZINE  10 mg Oral TID  . ferric gluconate (FERRLECIT/NULECIT) IV  250 mg Intravenous Daily  . ferrous sulfate  325 mg Oral BID  WC  . levothyroxine  125 mcg Oral QAC breakfast  . pantoprazole (PROTONIX) IV  40 mg Intravenous Q12H  . sodium chloride  3 mL Intravenous Q12H  . sodium chloride       Continuous Infusions:   ________________________________________________________________________  Time spent: 30 min    [facility]  [facility] Pager [phone] If 8PM-8AM, please contact night-coverage at [email or web address], password [facility] 08/28/2012, 7:41 PM  LOS: 1 day

## 2012-08-28 NOTE — Progress Notes (Signed)
GASTROENTEROLOGY PROGRESS NOTE  Problem:   1. Coffee-ground emesis 2. Iron deficiency anemia 3. Hiccups (chronic intermittent) 4. Moderate-sized hiatal hernia with Sheria Lang lesions  Subjective: Hiccups continue. Feels ready to try solid food. I don't believe there has been any further coffee-ground emesis. Endoscopic findings reviewed with patient and family.  Objective: Hemoglobin drifted down to 7.3. Patient currently getting transfused. BUN stable at 44. Labs came back consistent with iron deficiency anemia, with both low iron saturation and low ferritin levels.  Assessment: 1. Quiescent minimal upper GI bleed. 2. Chronic anemia due to iron deficiency. This accounts for the majority of the patient's currently low blood count, although there is probably a slight element of acute posthemorrhagic anemia as well.   Plan: I believe that the patient's hiatal hernia may be accounting for his chronic anemia. It almost certainly accounts for his current mild upper GI bleed Sheria Lang lesions with coffee-ground emesis).  I think it is even possible that the patient's hiatal hernia could be contributing to his hiccups.  Recommendation: 1. Okay to try to advance diet 2. Okay from GI bleeding standpoint to move patient to a regular floor bed. I will defer this decision to the hospitalist. 3. I discussed the option of surgical correction of his hiatal hernia and the patient is interested. The patient and his family would like to pursue that while he is here in the hospital, so I will tentatively plan for surgical consultation in the morning. They understand that it is not necessarily the case that, even if surgery is felt to be clinically indicated, that it would occur during this hospitalization. Further testing, such as a GI series, manometry, and obtaining records from Dr. Augusto Gamble in [place] might be necessary before a final decision about the advisability of surgery can be  reached.     Florencia Reasons, M.D. 08/28/2012 1:22 PM

## 2012-08-29 ENCOUNTER — Inpatient Hospital Stay (HOSPITAL_COMMUNITY): Payer: Medicare PPO

## 2012-08-29 ENCOUNTER — Encounter (HOSPITAL_COMMUNITY): Payer: Self-pay | Admitting: Gastroenterology

## 2012-08-29 DIAGNOSIS — K922 Gastrointestinal hemorrhage, unspecified: Secondary | ICD-10-CM

## 2012-08-29 DIAGNOSIS — K449 Diaphragmatic hernia without obstruction or gangrene: Secondary | ICD-10-CM

## 2012-08-29 LAB — CBC
HCT: 27.8 % — ABNORMAL LOW (ref 39.0–52.0)
MCH: 26.8 pg (ref 26.0–34.0)
MCHC: 33.5 g/dL (ref 30.0–36.0)
MCV: 80.1 fL (ref 78.0–100.0)
Platelets: 178 10*3/uL (ref 150–400)
RDW: 16.8 % — ABNORMAL HIGH (ref 11.5–15.5)

## 2012-08-29 LAB — TYPE AND SCREEN
Unit division: 0
Unit division: 0

## 2012-08-29 LAB — URINE CULTURE: Colony Count: 50000

## 2012-08-29 MED ORDER — PANTOPRAZOLE SODIUM 40 MG PO TBEC
40.0000 mg | DELAYED_RELEASE_TABLET | Freq: Two times a day (BID) | ORAL | Status: DC
  Administered 2012-08-30: 40 mg via ORAL
  Filled 2012-08-29: qty 1

## 2012-08-29 MED ORDER — SODIUM CHLORIDE 0.9 % IJ SOLN
INTRAMUSCULAR | Status: AC
  Administered 2012-08-29: 10 mL
  Filled 2012-08-29: qty 10

## 2012-08-29 NOTE — Consult Note (Signed)
Reason for Consult:Hialatial hernia/GI bleed Referring Physician: Cederic Mozley is an 78 y.o. male.  HPI: 78 year old male with a history of hypertension, hypothyroidism, chronic anemia, chronic hiccups, BPH( s/p TURP), history of GERD with upper GI bleed and Mallory-Weiss tear in 2005 with findings of esophageal erosions on EGD done at that time, history of ileus with no significant finding on exploratory laparotomy ( Dr.Zeigler 04/2012); had a syncopal episode at home. Patient was getting up to go to the bathroom when he passed out and does not remember the incident. He was found on the floor by a family member.The episode of syncope was brief lasting for seconds. He did not sustain any head injury. Patient denies any dizziness or blurry vision. He was however having persistent hiccups. In the ED patient was found to be hypotensive with systolic blood pressure in 80s and was also orthostatic. Patient was sent for head CT and had 2 episodes of coffee-ground emesis of significant amount as per the ED physician. Patient was placed on Protonix drip and given a dose of Thorazine for his checkups. Patient was also placed on IV fluids for low blood pressure. An EKG was normal except for sinus tachycardia. Head CT was unremarkable.  Labs showed leukocytosis with WBC of 15,000, anemia with hemoglobin of 7.8, hypo-kalemia and an elevated lactate of 3. Patient denies any abdominal pain or nausea at this time. He denies any headache, blurry vision, dizziness, generalized weakness, chest pain, palpitations, shortness of breath, bowel or urinary symptoms. He denies any sick contacts or eating anything outside. He denies similar symptoms in the past.  We are asked to see the patient on consult for recommendations concerning his hiatal hernia.   Past Medical History  Diagnosis Date  . Hypertension   . GERD (gastroesophageal reflux disease)   . Thyroid disease   . Upper GI bleed   . Ileus   . Mallory -  Weiss tear   . Chronic anemia   . Enlarged prostate     Elevated PSA  . 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 Opal Sidles benign biopsies February 2005  . History of BPH     s/p TURP 2008    Past Surgical History  Procedure Date  . Thyroidectomy   . Cholecystectomy   . Esophagogastroduodenoscopy 08/31/08    Rourk-possible extrinsic impression on midesophagus, noncritical Schatzki's ring, 3 mm fundal gland polyp, multiple gastric fundic polyps not manipulated, small hiatal hernia  . Colonoscopy 10/29/03    rourk-inflammatory polyp from 40 cm removed  . Knee arthroscopy     pt denies  . Transurethral resection of prostate     pt denies, but this is listed in his past medical records.   . Laparotomy 04/26/2012    Procedure: EXPLORATORY LAPAROTOMY;  Surgeon: Fabio Bering, MD;  Location: [facility];  Service: General;  Laterality: N/A;  . Laparotomy 04/26/2012    Procedure: EXPLORATORY LAPAROTOMY;  Surgeon: Fabio Bering, MD;  Location: [facility];  Service: General;  Laterality: N/A;  . Central venous catheter insertion 04/26/2012    Procedure: INSERTION CENTRAL LINE ADULT;  Surgeon: Fabio Bering, MD;  Location: [facility];  Service: General;  Laterality: Left;  started at 0919, ended at 0930,        subclavian  . Esophagogastroduodenoscopy 08/27/2012    Procedure: ESOPHAGOGASTRODUODENOSCOPY (EGD);  Surgeon: Florencia Reasons, MD;  Location: [facility];  Service: Endoscopy;  Laterality: N/A;  do at bedside in stepdown unit    Family History  Problem Relation Age of Onset  . Cancer Other   . Heart failure Other   . Liver disease Sister   . Breast cancer Sister   . Hypertension Mother   . Stroke Father   . Colon cancer Neg Hx     Social History:  reports that he has quit smoking. His smoking use included Cigarettes. He smoked .5 packs per day. He has never used smokeless tobacco. He reports that he does not drink  alcohol or use illicit drugs.  Allergies: No Known Allergies  Medications: I have reviewed the patient's current medications.  Results for orders placed during the hospital encounter of 08/27/12 (from the past 48 hour(s))  HEMOGLOBIN A1C     Status: Abnormal   Collection Time   08/27/12 11:35 AM      Component Value Range Comment   Hemoglobin A1C 5.8 (*) <5.7 %    Mean Plasma Glucose 120 (*) <117 mg/dL   CULTURE, BLOOD (ROUTINE X 2)     Status: Normal (Preliminary result)   Collection Time   08/27/12  2:40 PM      Component Value Range Comment   Specimen Description BLOOD LEFT ARM      Special Requests BOTTLES DRAWN AEROBIC AND ANAEROBIC 10CC      Culture  Setup Time 08/27/2012 23:59      Culture        Value:        BLOOD CULTURE RECEIVED NO GROWTH TO DATE CULTURE WILL BE HELD FOR 5 DAYS BEFORE ISSUING A FINAL NEGATIVE REPORT   Report Status PENDING     CULTURE, BLOOD (ROUTINE X 2)     Status: Normal (Preliminary result)   Collection Time   08/27/12  2:45 PM      Component Value Range Comment   Specimen Description BLOOD RIGHT ARM      Special Requests BOTTLES DRAWN AEROBIC ONLY 10CC      Culture  Setup Time 08/27/2012 23:55      Culture        Value:        BLOOD CULTURE RECEIVED NO GROWTH TO DATE CULTURE WILL BE HELD FOR 5 DAYS BEFORE ISSUING A FINAL NEGATIVE REPORT   Report Status PENDING     URINALYSIS, ROUTINE W REFLEX MICROSCOPIC     Status: Abnormal   Collection Time   08/27/12  4:24 PM      Component Value Range Comment   Color, Urine YELLOW  YELLOW    APPearance HAZY (*) CLEAR    Specific Gravity, Urine 1.024  1.005 - 1.030    pH 5.0  5.0 - 8.0    Glucose, UA NEGATIVE  NEGATIVE mg/dL    Hgb urine dipstick NEGATIVE  NEGATIVE    Bilirubin Urine NEGATIVE  NEGATIVE    Ketones, ur NEGATIVE  NEGATIVE mg/dL    Protein, ur NEGATIVE  NEGATIVE mg/dL    Urobilinogen, UA 0.2  0.0 - 1.0 mg/dL    Nitrite NEGATIVE  NEGATIVE    Leukocytes, UA MODERATE (*) NEGATIVE   URINE  MICROSCOPIC-ADD ON     Status: Abnormal   Collection Time   08/27/12  4:24 PM      Component Value Range Comment   Squamous Epithelial / LPF FEW (*) RARE    WBC, UA 7-10  <3 WBC/hpf    Bacteria, UA FEW (*) RARE    Casts GRANULAR  CAST (*) NEGATIVE HYALINE CASTS   Urine-Other MUCOUS PRESENT     CBC     Status: Abnormal   Collection Time   08/27/12  5:23 PM      Component Value Range Comment   WBC 12.7 (*) 4.0 - 10.5 K/uL    RBC 2.87 (*) 4.22 - 5.81 MIL/uL    Hemoglobin 6.9 (*) 13.0 - 17.0 g/dL    HCT 16.1 (*) 09.6 - 52.0 %    MCV 76.0 (*) 78.0 - 100.0 fL    MCH 24.0 (*) 26.0 - 34.0 pg    MCHC 31.7  30.0 - 36.0 g/dL    RDW 04.5 (*) 40.9 - 15.5 %    Platelets 282  150 - 400 K/uL   LACTIC ACID, PLASMA     Status: Normal   Collection Time   08/27/12  5:23 PM      Component Value Range Comment   Lactic Acid, Venous 1.1  0.5 - 2.2 mmol/L   FERRITIN     Status: Abnormal   Collection Time   08/27/12  5:23 PM      Component Value Range Comment   Ferritin 8 (*) 22 - 322 ng/mL   IRON AND TIBC     Status: Abnormal   Collection Time   08/27/12  5:23 PM      Component Value Range Comment   Iron 17 (*) 42 - 135 ug/dL    TIBC 811  914 - 782 ug/dL    Saturation Ratios 6 (*) 20 - 55 %    UIBC 269  125 - 400 ug/dL   PREPARE RBC (CROSSMATCH)     Status: Normal   Collection Time   08/27/12  7:00 PM      Component Value Range Comment   Order Confirmation ORDER PROCESSED BY BLOOD BANK     BASIC METABOLIC PANEL     Status: Abnormal   Collection Time   08/28/12  5:00 AM      Component Value Range Comment   Sodium 138  135 - 145 mEq/L    Potassium 4.0  3.5 - 5.1 mEq/L    Chloride 104  96 - 112 mEq/L    CO2 23  19 - 32 mEq/L    Glucose, Bld 125 (*) 70 - 99 mg/dL    BUN 44 (*) 6 - 23 mg/dL    Creatinine, Ser 9.56  0.50 - 1.35 mg/dL    Calcium 7.7 (*) 8.4 - 10.5 mg/dL    GFR calc non Af Amer 50 (*) >90 mL/min    GFR calc Af Amer 58 (*) >90 mL/min   CBC     Status: Abnormal   Collection  Time   08/28/12  8:22 AM      Component Value Range Comment   WBC 12.7 (*) 4.0 - 10.5 K/uL    RBC 2.81 (*) 4.22 - 5.81 MIL/uL    Hemoglobin 7.3 (*) 13.0 - 17.0 g/dL    HCT 21.3 (*) 08.6 - 52.0 %    MCV 79.0  78.0 - 100.0 fL    MCH 26.0  26.0 - 34.0 pg    MCHC 32.9  30.0 - 36.0 g/dL    RDW 57.8 (*) 46.9 - 15.5 %    Platelets 234  150 - 400 K/uL   PREPARE RBC (CROSSMATCH)     Status: Normal   Collection Time   08/28/12  8:37 AM      Component Value Range Comment  Order Confirmation ORDER PROCESSED BY BLOOD BANK     CBC     Status: Abnormal   Collection Time   08/28/12  4:02 PM      Component Value Range Comment   WBC 12.8 (*) 4.0 - 10.5 K/uL    RBC 3.64 (*) 4.22 - 5.81 MIL/uL    Hemoglobin 9.9 (*) 13.0 - 17.0 g/dL POST TRANSFUSION SPECIMEN   HCT 29.1 (*) 39.0 - 52.0 %    MCV 79.9  78.0 - 100.0 fL    MCH 27.2  26.0 - 34.0 pg    MCHC 34.0  30.0 - 36.0 g/dL    RDW 16.1 (*) 09.6 - 15.5 %    Platelets 189  150 - 400 K/uL   CBC     Status: Abnormal   Collection Time   08/29/12  4:35 AM      Component Value Range Comment   WBC 10.9 (*) 4.0 - 10.5 K/uL    RBC 3.47 (*) 4.22 - 5.81 MIL/uL    Hemoglobin 9.3 (*) 13.0 - 17.0 g/dL    HCT 04.5 (*) 40.9 - 52.0 %    MCV 80.1  78.0 - 100.0 fL    MCH 26.8  26.0 - 34.0 pg    MCHC 33.5  30.0 - 36.0 g/dL    RDW 81.1 (*) 91.4 - 15.5 %    Platelets 178  150 - 400 K/uL     No results found.  Review of Systems  Constitutional: Negative.   HENT: Negative.   Eyes: Negative.   Respiratory: Negative.   Cardiovascular: Negative.   Gastrointestinal: Positive for nausea, vomiting and abdominal pain. Negative for heartburn, diarrhea, constipation, blood in stool and melena.       Persistent hiccups Had episode of "coffee ground" emesis prior to admisson  Genitourinary: Negative.   Musculoskeletal: Negative.   Skin: Negative.   Neurological: Positive for dizziness. Negative for tingling, tremors, sensory change, speech change, focal weakness,  seizures and loss of consciousness.       Patient had syncopal episode precipitating admission to [facility]  Endo/Heme/Allergies: Negative.   Psychiatric/Behavioral: Negative.    Blood pressure 126/52, pulse 74, temperature 99.2 F (37.3 C), temperature source Oral, resp. rate 16, height 5\' 3"  (1.6 m), weight 175 lb (79.379 kg), SpO2 98.00%. Physical Exam  Constitutional: He is oriented to person, place, and time. He appears well-developed and well-nourished.  HENT:  Head: Normocephalic and atraumatic.  Nose: Nose normal.  Mouth/Throat: No oropharyngeal exudate.  Eyes: Conjunctivae normal and EOM are normal. Pupils are equal, round, and reactive to light. Right eye exhibits no discharge. Left eye exhibits no discharge. No scleral icterus.  Neck: Normal range of motion. Neck supple. No JVD present. No tracheal deviation present. No thyromegaly present.  Cardiovascular: Normal rate, regular rhythm, normal heart sounds and intact distal pulses.  Exam reveals no gallop and no friction rub.   No murmur heard. Respiratory: Effort normal and breath sounds normal. No stridor. No respiratory distress. He has no wheezes. He has no rales. He exhibits no tenderness.  GI: Soft. Bowel sounds are normal. He exhibits no distension and no mass. There is no tenderness. There is no rebound and no guarding.  Musculoskeletal: Normal range of motion. He exhibits no edema and no tenderness.  Lymphadenopathy:    He has no cervical adenopathy.  Neurological: He is alert and oriented to person, place, and time.  Skin: Skin is warm and dry. No rash noted. No erythema.  No pallor.  Psychiatric: He has a normal mood and affect.    Assessment/Plan:  Patient Active Problem List  Diagnosis  . Post-op Acute respiratory failure  . Respiratory alkalosis  . Aspiration pneumonia  . Hypokalemia  . Acute renal failure  . S/P exploratory laparotomy  . Adynamic ileus  . CAP (community acquired pneumonia)  . Protein calorie  malnutrition  . Fever  . UTI (lower urinary tract infection)  . Leukocytosis  . Transaminitis  . Hypothyroidism  . Sepsis associated hypotension  . DVT of upper extremity (deep vein thrombosis)  . Hematemesis  . Syncope and collapse  . SIRS (systemic inflammatory response syndrome)   Will obtain UGI today or tomorrow and then discuss with patient possible surgical options. will need to obtain past records from Dr. Augusto Gamble in [place] prior to any surgical interventions.   At present he appears to be doing well, no hypotension or bleeding at present.  Stable from our perspective for transfer to floor bed. I have discussed the plan from a surgical standpoint with the patient and he is in agreement. Management of his other medical issues per Medicine team and GI at this time.  We will continue to follow   Thank you for this consult.     Blenda Mounts [facility] Pager # [phone] 08/29/2012, 11:25 AM

## 2012-08-29 NOTE — Evaluation (Signed)
Physical Therapy Evaluation Patient Details Name: Victor Hunter MRN: [id number] DOB: 1934-03-04 Today's Date: 08/29/2012 Time: 9562-1308 PT Time Calculation (min): 17 min  PT Assessment / Plan / Recommendation Clinical Impression  Pt is 78 y/o male admitted for syncope episode with chronic anemia and Hematemesis.  Pt overall decondition and fatigued and will benefit from further PT services to improve overall mobility and prepare for safe d/c home.    PT Assessment  Patient needs continued PT services    Follow Up Recommendations  Home health PT;Supervision/Assistance - 24 hour    Barriers to Discharge None      Equipment Recommendations  None recommended by PT    Frequency Min 3X/week    Precautions / Restrictions Precautions Precautions: Fall Restrictions Weight Bearing Restrictions: No   Pertinent Vitals/Pain No c/o dizziness      Mobility  Bed Mobility Bed Mobility: Not assessed Transfers Transfers: Sit to Stand;Stand to Sit Sit to Stand: 4: Min assist;From chair/3-in-1 Stand to Sit: 4: Min assist;To chair/3-in-1 Details for Transfer Assistance: (A) to initiate transfer with cues for hand placement Ambulation/Gait Ambulation/Gait Assistance: 4: Min assist Ambulation Distance (Feet): 60 Feet Assistive device: 1 person hand held assist Ambulation/Gait Assistance Details: (A) with HHA to maintain balance.   Gait Pattern: Step-through pattern;Shuffle;Wide base of support Gait velocity: slow cadence General Gait Details: Pt with wide BOS very guarded gait with slow cadence Stairs: No Wheelchair Mobility Wheelchair Mobility: No    Shoulder Instructions     Exercises     PT Diagnosis: Difficulty walking;Generalized weakness  PT Problem List: Decreased strength;Decreased activity tolerance;Decreased mobility;Decreased knowledge of use of DME PT Treatment Interventions: DME instruction;Gait training;Stair training;Functional mobility training;Therapeutic  activities;Therapeutic exercise;Patient/family education   PT Goals Acute Rehab PT Goals PT Goal Formulation: With patient Time For Goal Achievement: 09/05/12 Potential to Achieve Goals: Good Pt will go Supine/Side to Sit: with modified independence PT Goal: Supine/Side to Sit - Progress: Goal set today Pt will go Sit to Supine/Side: with modified independence PT Goal: Sit to Supine/Side - Progress: Goal set today Pt will go Sit to Stand: with modified independence PT Goal: Sit to Stand - Progress: Goal set today Pt will go Stand to Sit: with modified independence PT Goal: Stand to Sit - Progress: Goal set today Pt will Ambulate: >150 feet;with modified independence;with least restrictive assistive device PT Goal: Ambulate - Progress: Goal set today Pt will Go Up / Down Stairs: 1-2 stairs;with modified independence;with least restrictive assistive device PT Goal: Up/Down Stairs - Progress: Goal set today  Visit Information  Last PT Received On: 08/29/12 Assistance Needed: +1    Subjective Data  Subjective: I'm ok.  Just tired. 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    Extremity/Trunk Assessment Right Lower Extremity Assessment RLE ROM/Strength/Tone: Within functional levels Left Lower Extremity Assessment LLE ROM/Strength/Tone: Within functional levels   Balance Balance Balance Assessed: No  End of Session PT - End of Session Equipment Utilized During Treatment: Gait belt Activity Tolerance: Patient limited by fatigue Patient left: in chair;with call bell/phone within reach Nurse Communication: Mobility status  GP     Hildy Nicholl 08/29/2012, 2:01 PM Jake Shark, PT DPT [phone]

## 2012-08-29 NOTE — Consult Note (Signed)
Agree with A&P of JD,NP. I have reviewed his prior operative notes - prior cholecystectomy and x-lap for sbo but no obstructin found (this was 04/2012).   It is hard to tell how symptomatic his hernia is, and with the chronic anemia additional w/o in needed (i.e. Colonoscopy) I do not believe he wil need urgent surgery and should be terated medically for now, with plans for thorough outpatient evaluation and decision making process.  UGI is pending and may influence decision on surgery. If surgery contemplated, will probably need manometry

## 2012-08-29 NOTE — Progress Notes (Signed)
Patient transferred to [facility] room 7.  Report called to RN on 6N and all questions answered. Patient transferred via wheelchair with NT and family.

## 2012-08-29 NOTE — Progress Notes (Signed)
GASTROENTEROLOGY PROGRESS NOTE  Problem:   Coffee ground emesis. Chronic iron deficiency anemia. Moderate sized hiatal hernia with Cameron erosions. Hiccups.  Subjective: Sitting up in chair, and no distress, tolerating full liquid diet  Objective: Appropriate posttransfusion rise in hemoglobin (7.3 yesterday, received 2 units, today is 9.3)  Assessment: Stable from GI bleeding perspective. Need to consider elective hiatal hernia repair.  Plan: Surgery consultation obtained. Discussed patient with Dr. Cicero Duck. Preliminary surgery consultation note reviewed. Have attempted to contact Dr. Augusto Gamble in [place], who has worked with the patient before, but there is no answer at his office number.  Florencia Reasons, M.D. 08/29/2012 12:10 PM

## 2012-08-29 NOTE — Progress Notes (Signed)
[facility] PROGRESS NOTE  Victor Hunter ZOX:[id number] DOB: 14-Mar-1934 DOA: 08/27/2012 PCP: Avon Gully, MD  Brief narrative: 78 year old male with a history of hypertension, hypothyroidism, chronic anemia, chronic hiccups, BPH( s/p TURP), history of GERD with upper GI bleed and Mallory-Weiss tear in 2005 with findings of esophageal erosions on EGD done at that time, history of ileus with no significant finding on exploratory laparotomy few months back had a syncopal episode at home this morning. Patient was getting up to go to the bathroom when he asked out and does not remember the incident. He was found to be on the floor by a family member and the episode of syncope was brief lasting for seconds. He did not sustain any head injury. Patient denies any dizziness or blurry vision. He was however having persistent hiccups. In the ED patient was found to be hypotensive with systolic blood pressure in 80s and was also orthostatic. Patient was sent for head CT and had 2 episodes of coffee-ground emesis of significant amount as per the ED physician. Patient was placed on Protonix drip and given a dose of Thorazine for his checkups.  [facility] was also consulted from the ED.   Assessment/Plan: Principal Problem: Syncope and Collapse Due to blood loss  Hematemesis Pt was on ASA - no clear reason for this per pt and family- no h/o CVA, CAD or PVD- I have discontinued it - also was recently on Coumadin for recent upper arm DVT- this had been stopped and he was NOT taking it prior to this admission Found to have hiatal hernia and cameron lesions on EGD today which are likely what bled- diet advanced to solids today - transfer to med/ surg- surgery service evaluating for need for surgery   *SIRS (systemic inflammatory response syndrome) Improved   Hypokalemia Replaced  Anemia- due to chronic blood loss over past few months and acute blood loss on admission/ iron deficiency due to chronic  blood loss Transfused 2 U PRBC and given IV and PO Iron  Mild leukocytosis May be due to stress, UA also mildly positive -  Cultures still pendind- not on antibiotics.   Recent upper extremity DVT in August No longer needs coumadin as repeat doppler revealed resolution.   HTN Hold meds due to hypotension  Hiccups Possibly from hiatal hernia PRN Thorazine   Code Status: full code Family Communication: daughter Disposition Plan: transfer to med/surg- HHPT per PT note DVT prophylaxis: SCDs   Consultants:  GI  Procedures:  EGD today  Antibiotics:  none  HPI/Subjective: Pt alert, tolerated full liquids- no complaints.   Objective: Filed Vitals:   08/29/12 0000 08/29/12 0355 08/29/12 0800 08/29/12 1200  BP: 131/57 109/58 126/52 129/59  Pulse: 79 63 74 70  Temp: 98.8 F (37.1 C) 98.8 F (37.1 C) 99.2 F (37.3 C) 99.3 F (37.4 C)  TempSrc: Oral Oral Oral Oral  Resp: 15 13 16 17   Height:      Weight:      SpO2: 98% 99% 98% 98%    Intake/Output Summary (Last 24 hours) at 08/29/12 1603 Last data filed at 08/29/12 1200  Gross per 24 hour  Intake    350 ml  Output    825 ml  Net   -475 ml    Exam:   General:  Alert, oriented x 3  Cardiovascular: RRR, no murmurs  Respiratory: CTA b/l   Abdomen: soft NT, ND, BS+  Ext: no c/c/e  Data Reviewed: Basic Metabolic Panel:  Lab  08/28/12 0500 08/27/12 0815 08/27/12 0743  NA 138 140 138  K 4.0 3.1* 3.0*  CL 104 100 100  CO2 23 -- 27  GLUCOSE 125* 173* 181*  BUN 44* 46* 46*  CREATININE 1.33 1.00 1.11  CALCIUM 7.7* -- 8.6  MG -- -- --  PHOS -- -- --   Liver Function Tests:  Lab 08/27/12 0743  AST 10  ALT 8  ALKPHOS 130*  BILITOT 0.1*  PROT 6.3  ALBUMIN 3.0*   No results found for this basename: LIPASE:5,AMYLASE:5 in the last 168 hours No results found for this basename: AMMONIA:5 in the last 168 hours CBC:  Lab 08/29/12 0435 08/28/12 1602 08/28/12 0822 08/27/12 1723 08/27/12 0815 08/27/12  0743  WBC 10.9* 12.8* 12.7* 12.7* -- 15.5*  NEUTROABS -- -- -- -- -- --  HGB 9.3* 9.9* 7.3* 6.9* 8.8* --  HCT 27.8* 29.1* 22.2* 21.8* 26.0* --  MCV 80.1 79.9 79.0 76.0* -- 76.6*  PLT 178 189 234 282 -- 314   Cardiac Enzymes: No results found for this basename: CKTOTAL:5,CKMB:5,CKMBINDEX:5,TROPONINI:5 in the last 168 hours BNP (last 3 results)  Basename 04/27/12 1420  PROBNP 132.0   CBG:  Lab 08/27/12 0713  GLUCAP 153*    Recent Results (from the past 240 hour(s))  CULTURE, BLOOD (ROUTINE X 2)     Status: Normal (Preliminary result)   Collection Time   08/27/12  2:40 PM      Component Value Range Status Comment   Specimen Description BLOOD LEFT ARM   Final    Special Requests BOTTLES DRAWN AEROBIC AND ANAEROBIC 10CC   Final    Culture  Setup Time 08/27/2012 23:59   Final    Culture     Final    Value:        BLOOD CULTURE RECEIVED NO GROWTH TO DATE CULTURE WILL BE HELD FOR 5 DAYS BEFORE ISSUING A FINAL NEGATIVE REPORT   Report Status PENDING   Incomplete   CULTURE, BLOOD (ROUTINE X 2)     Status: Normal (Preliminary result)   Collection Time   08/27/12  2:45 PM      Component Value Range Status Comment   Specimen Description BLOOD RIGHT ARM   Final    Special Requests BOTTLES DRAWN AEROBIC ONLY 10CC   Final    Culture  Setup Time 08/27/2012 23:55   Final    Culture     Final    Value:        BLOOD CULTURE RECEIVED NO GROWTH TO DATE CULTURE WILL BE HELD FOR 5 DAYS BEFORE ISSUING A FINAL NEGATIVE REPORT   Report Status PENDING   Incomplete      Studies: Dg Chest 1 View  08/27/2012  *RADIOLOGY REPORT*  Clinical Data: Fall  CHEST - 1 VIEW  Comparison: Acute abdominal series 07/29/2012  Findings: Heart and mediastinal contours are stable.  The lungs are clear. No visible pleural effusion.  There are surgical clips at the thoracic inlet bilaterally.  There is a moderate convex right scoliosis of the thoracic spine, stable.  Cholecystectomy clips in the right upper quadrant.   IMPRESSION: No acute cardiopulmonary disease.   Original Report Authenticated By: Britta Mccreedy, M.D.    Ct Head Wo Contrast  08/27/2012  *RADIOLOGY REPORT*  Clinical Data: Status post fall; concern for head injury. Hypotension.  CT HEAD WITHOUT CONTRAST  Technique:  Contiguous axial images were obtained from the base of the skull through the vertex without contrast.  Comparison: PET /  CT performed 09/16/2008  Findings: There is no evidence of acute infarction, mass lesion, or intra- or extra-axial hemorrhage on CT.  Mild prominence of the ventricles may reflect minimal cortical volume loss.  The brainstem and fourth ventricle are within normal limits.  The basal ganglia are unremarkable in appearance.  The cerebral hemispheres demonstrate grossly normal gray-white differentiation. No mass effect or midline shift is seen.  There is no evidence of fracture; visualized osseous structures are unremarkable in appearance.  The visualized portions of the orbits are within normal limits.  The paranasal sinuses and mastoid air cells are well-aerated.  No significant soft tissue abnormalities are seen.  IMPRESSION: No evidence of traumatic intracranial injury or fracture.   Original Report Authenticated By: Tonia Ghent, M.D.     Scheduled Meds:    . chlorproMAZINE  10 mg Oral TID  . ferrous sulfate  325 mg Oral BID WC  . levothyroxine  125 mcg Oral QAC breakfast  . pantoprazole (PROTONIX) IV  40 mg Intravenous Q12H  . sodium chloride  3 mL Intravenous Q12H   Continuous Infusions:   ________________________________________________________________________  Time spent: 20 min    [facility]  [facility] Pager [phone] If 8PM-8AM, please contact night-coverage at [email or web address], password [facility] 08/29/2012, 4:03 PM  LOS: 2 days

## 2012-08-30 ENCOUNTER — Inpatient Hospital Stay (HOSPITAL_COMMUNITY): Payer: Medicare PPO

## 2012-08-30 MED ORDER — TAMSULOSIN HCL 0.4 MG PO CAPS
0.4000 mg | ORAL_CAPSULE | Freq: Every day | ORAL | Status: DC
  Filled 2012-08-30: qty 1

## 2012-08-30 MED ORDER — FERROUS SULFATE 325 (65 FE) MG PO TABS: 325.0000 mg | ORAL_TABLET | Freq: Every day | ORAL | Status: DC

## 2012-08-30 MED ORDER — PANTOPRAZOLE SODIUM 40 MG PO TBEC: 40.0000 mg | DELAYED_RELEASE_TABLET | Freq: Two times a day (BID) | ORAL | Status: DC

## 2012-08-30 NOTE — Discharge Summary (Signed)
Physician Discharge Summary  Victor Hunter MRN: [id number] DOB/AGE: 1934/06/11 78 y.o.  PCP: Avon Gully, MD   Admit date: 08/27/2012 Discharge date: 08/30/2012  Discharge Diagnoses:  Bleeding hiatal hernia and cameron lesions  Anemia requiring transfusion  *SIRS (systemic inflammatory response syndrome) Active Problems:  Hypokalemia  Hematemesis  Syncope and collapse     Medication List     As of 08/30/2012 10:40 AM    STOP taking these medications         aspirin EC 81 MG tablet      hydrochlorothiazide 25 MG tablet   Commonly known as: HYDRODIURIL      TAKE these medications         calcium carbonate 500 MG chewable tablet   Commonly known as: TUMS - dosed in mg elemental calcium   Chew 2 tablets by mouth daily as needed. Acid Reflux      chlorproMAZINE 10 MG tablet   Commonly known as: THORAZINE   Take 1 tablet (10 mg total) by mouth 3 (three) times daily.      diltiazem 180 MG 24 hr capsule   Commonly known as: DILACOR XR   Take 180 mg by mouth daily.      ferrous sulfate 325 (65 FE) MG tablet   Take 1 tablet (325 mg total) by mouth daily with breakfast.      levothyroxine 125 MCG tablet   Commonly known as: SYNTHROID, LEVOTHROID   Take 125 mcg by mouth daily.      pantoprazole 40 MG tablet   Commonly known as: PROTONIX   Take 1 tablet (40 mg total) by mouth 2 (two) times daily before a meal.      potassium chloride SA 20 MEQ tablet   Commonly known as: K-DUR,KLOR-CON   Take 20 mEq by mouth daily.      Tamsulosin HCl 0.4 MG Caps   Commonly known as: FLOMAX   Take 0.4 mg by mouth daily.        Discharge Condition: Stable  Disposition: 01-Home or Self Care   Consults:  #1 GI #2 surgery   Significant Diagnostic Studies: Dg Chest 1 View  08/27/2012  *RADIOLOGY REPORT*  Clinical Data: Fall  CHEST - 1 VIEW  Comparison: Acute abdominal series 07/29/2012  Findings: Heart and mediastinal contours are stable.  The lungs are  clear. No visible pleural effusion.  There are surgical clips at the thoracic inlet bilaterally.  There is a moderate convex right scoliosis of the thoracic spine, stable.  Cholecystectomy clips in the right upper quadrant.  IMPRESSION: No acute cardiopulmonary disease.   Original Report Authenticated By: Britta Mccreedy, M.D.    Ct Head Wo Contrast  08/27/2012  *RADIOLOGY REPORT*  Clinical Data: Status post fall; concern for head injury. Hypotension.  CT HEAD WITHOUT CONTRAST  Technique:  Contiguous axial images were obtained from the base of the skull through the vertex without contrast.  Comparison: PET / CT performed 09/16/2008  Findings: There is no evidence of acute infarction, mass lesion, or intra- or extra-axial hemorrhage on CT.  Mild prominence of the ventricles may reflect minimal cortical volume loss.  The brainstem and fourth ventricle are within normal limits.  The basal ganglia are unremarkable in appearance.  The cerebral hemispheres demonstrate grossly normal gray-white differentiation. No mass effect or midline shift is seen.  There is no evidence of fracture; visualized osseous structures are unremarkable in appearance.  The visualized portions of the orbits are within normal limits.  The paranasal sinuses and mastoid air cells are well-aerated.  No significant soft tissue abnormalities are seen.  IMPRESSION: No evidence of traumatic intracranial injury or fracture.   Original Report Authenticated By: Tonia Ghent, M.D.    Varney Biles Kayleen Memos Hans Eden  08/29/2012  *RADIOLOGY REPORT*  Clinical Data:  Intractable hiccups.  Evaluate hiatal hernia.  UPPER GI SERIES WITH KUB  Technique:  Routine upper GI series was performed with thin barium. Examination is mildly limited by the limited patient mobility.  Fluoroscopy Time: 1.34 minutes  Comparison:  Acute abdominal series 07/29/2012.  Abdominal pelvic CT 04/23/2012.  Findings: The scout abdominal radiograph demonstrates moderate gastric distension and  cholecystectomy clips.  There are degenerative changes in the spine associated with a mild convex left scoliosis.  There are surgical clips at the thoracic inlet consistent with prior thyroid resection.  There is mild presbyesophagus with a decreased primary stripping wave.  There is only a small hiatal hernia.  The gastroesophageal junction appears patulous with spontaneous gastroesophageal reflux throughout the examination. No mucosal ulceration or focal stricture identified.  Gastric mucosal assessment is limited by the patient's limited mobility.  Placed in the right lateral decubitus position, there is rapid gastric emptying with opacification of the duodenum and jejunum.  No mucosal ulceration or obstruction is identified.  IMPRESSION:  1.  Small hiatal hernia with spontaneous gastroesophageal reflux and mild presbyesophagus.  No evidence of esophageal stricture or ulceration. 2.  Distended stomach without evidence of gastric outlet obstruction. Mucosal assessment limited.   Original Report Authenticated By: Carey Bullocks, M.D.        Microbiology: Recent Results (from the past 240 hour(s))  CULTURE, BLOOD (ROUTINE X 2)     Status: Normal (Preliminary result)   Collection Time   08/27/12  2:40 PM      Component Value Range Status Comment   Specimen Description BLOOD LEFT ARM   Final    Special Requests BOTTLES DRAWN AEROBIC AND ANAEROBIC 10CC   Final    Culture  Setup Time 08/27/2012 23:59   Final    Culture     Final    Value:        BLOOD CULTURE RECEIVED NO GROWTH TO DATE CULTURE WILL BE HELD FOR 5 DAYS BEFORE ISSUING A FINAL NEGATIVE REPORT   Report Status PENDING   Incomplete   CULTURE, BLOOD (ROUTINE X 2)     Status: Normal (Preliminary result)   Collection Time   08/27/12  2:45 PM      Component Value Range Status Comment   Specimen Description BLOOD RIGHT ARM   Final    Special Requests BOTTLES DRAWN AEROBIC ONLY 10CC   Final    Culture  Setup Time 08/27/2012 23:55   Final     Culture     Final    Value:        BLOOD CULTURE RECEIVED NO GROWTH TO DATE CULTURE WILL BE HELD FOR 5 DAYS BEFORE ISSUING A FINAL NEGATIVE REPORT   Report Status PENDING   Incomplete   URINE CULTURE     Status: Normal   Collection Time   08/27/12  4:24 PM      Component Value Range Status Comment   Specimen Description URINE, RANDOM   Final    Special Requests NONE   Final    Culture  Setup Time 08/27/2012 18:23   Final    Colony Count 50,000 COLONIES/ML   Final    Culture     Final  Value: Multiple bacterial morphotypes present, none predominant. Suggest appropriate recollection if clinically indicated.   Report Status 08/29/2012 FINAL   Final      Labs: Results for orders placed during the hospital encounter of 08/27/12 (from the past 48 hour(s))  CBC     Status: Abnormal   Collection Time   08/28/12  4:02 PM      Component Value Range Comment   WBC 12.8 (*) 4.0 - 10.5 K/uL    RBC 3.64 (*) 4.22 - 5.81 MIL/uL    Hemoglobin 9.9 (*) 13.0 - 17.0 g/dL POST TRANSFUSION SPECIMEN   HCT 29.1 (*) 39.0 - 52.0 %    MCV 79.9  78.0 - 100.0 fL    MCH 27.2  26.0 - 34.0 pg    MCHC 34.0  30.0 - 36.0 g/dL    RDW 16.1 (*) 09.6 - 15.5 %    Platelets 189  150 - 400 K/uL   CBC     Status: Abnormal   Collection Time   08/29/12  4:35 AM      Component Value Range Comment   WBC 10.9 (*) 4.0 - 10.5 K/uL    RBC 3.47 (*) 4.22 - 5.81 MIL/uL    Hemoglobin 9.3 (*) 13.0 - 17.0 g/dL    HCT 04.5 (*) 40.9 - 52.0 %    MCV 80.1  78.0 - 100.0 fL    MCH 26.8  26.0 - 34.0 pg    MCHC 33.5  30.0 - 36.0 g/dL    RDW 81.1 (*) 91.4 - 15.5 %    Platelets 178  150 - 400 K/uL      HPI : 78 year old male with a history of hypertension, hypothyroidism, chronic anemia, chronic hiccups, BPH( s/p TURP), history of GERD with upper GI bleed and Mallory-Weiss tear in 2005 with findings of esophageal erosions on EGD done at that time, history of ileus with no significant finding on exploratory laparotomy few months back  had a syncopal episode at home this morning. Patient was getting up to go to the bathroom when he asked out and does not remember the incident. He was found to be on the floor by a family member and the episode of syncope was brief lasting for seconds. He did not sustain any head injury. Patient denies any dizziness or blurry vision. He was however having persistent hiccups. In the ED patient was found to be hypotensive with systolic blood pressure in 80s and was also orthostatic. Patient was sent for head CT and had 2 episodes of coffee-ground emesis of significant amount as per the ED physician. Patient was placed on Protonix drip and given a dose of Thorazine for his checkups.  [facility] was also consulted from the ED.   HOSPITAL COURSE #1 anemia Pt was on ASA - no clear reason for this per pt and family- no h/o CVA, CAD or PVD- I have discontinued it  - also was recently on Coumadin for recent upper arm DVT- this had been stopped and he was NOT taking it prior to this admission Coffee ground emesis. Chronic iron deficiency anemia. EGD showed Moderate sized hiatal hernia with Cameron erosions. He had an Appropriate posttransfusion rise in hemoglobin 7.3 yesterday, received 2 units, today is 9.3 Stable from GI bleeding perspective. Need to consider elective hiatal hernia repair Surgery consultation obtained. Discussed patient with Dr. Cicero Duck. Preliminary surgery consultation note reviewed. Surgery attempted to contact Dr. Augusto Gamble in [place], anemia followup with him as well in  one to 2 weeks   *SIRS (systemic inflammatory response syndrome)  Improved  Hypokalemia  Replaced  Anemia- due to chronic blood loss over past few months and acute blood loss on admission/ iron deficiency due to chronic blood loss  Transfused 2 U PRBC and given IV and PO Iron , he will continue ferrous sulfate Mild leukocytosis  May be due to stress, UA also mildly positive - Cultures still pendind- not on  antibiotics.  Recent upper extremity DVT in August  No longer needs coumadin as repeat doppler revealed resolution.  HTN  PPD the discontinued, patient may resume Cardizem     Discharge Exam:  Blood pressure 126/73, pulse 85, temperature 98.6 F (37 C), temperature source Oral, resp. rate 18, height 5\' 3"  (1.6 m), weight 79.379 kg (175 lb), SpO2 98.00%.    General: Alert, oriented x 3  Cardiovascular: RRR, no murmurs  Respiratory: CTA b/l  Abdomen: soft NT, ND, BS+  Ext: no c/c/e      Signed: Franki Hunter 08/30/2012, 10:40 AM

## 2012-08-30 NOTE — Care Management Note (Signed)
  Page 1 of 1   08/30/2012     4:02:35 PM   CARE MANAGEMENT NOTE 08/30/2012  Patient:  Victor Hunter, Victor Hunter   Account Number:  [id number]  Date Initiated:  08/30/2012  Documentation initiated by:  Ronny Flurry  Subjective/Objective Assessment:     Action/Plan:   Anticipated DC Date:  08/30/2012   Anticipated DC Plan:  HOME W HOME HEALTH SERVICES         Choice offered to / List presented to:  C-1 Patient        HH arranged  HH-1 RN  HH-2 PT      HH agency  [facility]   Status of service:  Completed, signed off Medicare Important Message given?   (If response is "NO", the following Medicare IM given date fields will be blank) Date Medicare IM given:   Date Additional Medicare IM given:    Discharge Disposition:    Per UR Regulation:  Reviewed for med. necessity/level of care/duration of stay  If discussed at Long Length of Stay Meetings, dates discussed:    Comments:  08-30-12 Spoke with patient and daughter at bedside they want to go home with home health . Ronny Flurry RN BSN [phone]

## 2012-08-30 NOTE — Progress Notes (Signed)
Physical Therapy Treatment Patient Details Name: Victor Hunter MRN: [id number] DOB: 1934/07/22 Today's Date: 08/30/2012 Time: 0865-7846 PT Time Calculation (min): 23 min  PT Assessment / Plan / Recommendation Comments on Treatment Session  Pt making good progress with mobility & PT goals at this date.  Ambulated entire 6N unit.  Encouraged pt to ambulate 2-3x's/day with Nsing.      Follow Up Recommendations  Home health PT;Supervision/Assistance - 24 hour     Does the patient have the potential to tolerate intense rehabilitation     Barriers to Discharge        Equipment Recommendations  None recommended by PT    Recommendations for Other Services    Frequency Min 3X/week   Plan Discharge plan remains appropriate    Precautions / Restrictions Precautions Precautions: Fall Restrictions Weight Bearing Restrictions: No       Mobility  Bed Mobility Bed Mobility: Supine to Sit;Sitting - Scoot to Edge of Bed Supine to Sit: 6: Modified independent (Device/Increase time);HOB flat Sitting - Scoot to Edge of Bed: 6: Modified independent (Device/Increase time) Transfers Transfers: Sit to Stand;Stand to Sit Sit to Stand: 4: Min guard;With upper extremity assist;From bed Stand to Sit: 4: Min guard;With upper extremity assist;Other (comment) (w/c) Details for Transfer Assistance: Cues for hand placement.  Guarding for safety.    Ambulation/Gait Ambulation/Gait Assistance: 4: Min guard Ambulation Distance (Feet): 500 Feet Assistive device: None Ambulation/Gait Assistance Details: Guarding for safety.  Able to ambulate with no UE support.  No LOB noted.   Gait Pattern: Step-through pattern;Decreased stride length;Wide base of support Gait velocity: slow cadence General Gait Details: Pt with wide BOS very guarded gait with slow cadence Stairs: No Wheelchair Mobility Wheelchair Mobility: No      PT Goals Acute Rehab PT Goals Time For Goal Achievement: 09/05/12 Potential to  Achieve Goals: Good Pt will go Supine/Side to Sit: with modified independence PT Goal: Supine/Side to Sit - Progress: Met Pt will go Sit to Supine/Side: with modified independence Pt will go Sit to Stand: with modified independence PT Goal: Sit to Stand - Progress: Progressing toward goal Pt will go Stand to Sit: with modified independence PT Goal: Stand to Sit - Progress: Progressing toward goal Pt will Ambulate: >150 feet;with modified independence;with least restrictive assistive device PT Goal: Ambulate - Progress: Progressing toward goal Pt will Go Up / Down Stairs: 1-2 stairs;with modified independence;with least restrictive assistive device  Visit Information  Last PT Received On: 08/30/12 Assistance Needed: +1    Subjective Data  Subjective: "My hands are really swollen today" Patient Stated Goal: To return home and back to work   Cognition  Overall Cognitive Status: Appears within functional limits for tasks assessed/performed Arousal/Alertness: Awake/alert Orientation Level: Appears intact for tasks assessed    Balance     End of Session PT - End of Session Equipment Utilized During Treatment: Gait belt Activity Tolerance: Patient tolerated treatment well Patient left: Other (comment) (w/c to leave floor for test.  ) Nurse Communication: Mobility status     Verdell Face, [place] [phone] 08/30/2012

## 2012-08-30 NOTE — Progress Notes (Signed)
Upper GI series findings noted. It is interesting that, on review of the x-ray, as well as review of the report, it appears that the hiatal hernia is "small" in size, whereas endoscopically it appeared somewhat larger. This variability, of course, is characteristic of "sliding" hiatal hernias.   I remain fairly sure that this patient's anemia and hiccups are related to the hiatal hernia, especially taking into consideration the patulous character of the diaphragmatic hiatus and the visible, mechanical motion of the stomach over the diaphragm during breathing, as observed endoscopically, not to mention the observed mucosal erosions at the diaphragmatic hiatus.  I agree with the plans for the patient's outpatient followup with surgery.  I have left a voicemail at the office of Dr. Augusto Gamble in [place], the patient's primary gastroenterologist, who has done colonoscopy on him in the past, outlining the salient features of this admission and the likelihood that a colonoscopy would be needed prior to hiatal hernia repair surgery if that option is elected.  Please let me know if I can be of further assistance in this patient's care.  Florencia Reasons, M.D. [phone]

## 2012-08-30 NOTE — Progress Notes (Signed)
SIRS (systemic inflammatory response syndrome)  Assessment: SIRS (systemic inflammatory response syndrome) HH with reflux (by UGI)  Plan: He may be a candidate for HH/anti-reflux procedure at some point in the future. He should see one of my associates that does laparoscopic Nissens/HH repairs in the office in a few weeks to review indications and make a decision. He needs remainder of GI w/o (colonoscopy)  First. Don't know if the presbyesopagus will be an issue and will need manometry if surgery contemplated We will see again this admission PRN   Subjective: Feels a bit better this am No hiccoughs.  Objective: Vital signs in last 24 hours: Temp:  [98.6 F (37 C)-99.3 F (37.4 C)] 98.6 F (37 C) (12/27 0958) Pulse Rate:  [70-95] 85  (12/27 0958) Resp:  [16-20] 18  (12/27 0958) BP: (111-149)/(59-79) 126/73 mmHg (12/27 0958) SpO2:  [97 %-100 %] 98 % (12/27 0958) Last BM Date: 08/28/12  Intake/Output from previous day: 12/26 0701 - 12/27 0700 In: -  Out: 677 [Urine:677] Intake/Output this shift: Total I/O In: -  Out: 250 [Urine:250]  General appearance: alert, cooperative and no distress GI: soft, non-tender; bowel sounds normal; no masses,  no organomegaly  Lab Results:  No results found for this or any previous visit (from the past 24 hour(s)).   Studies/Results Radiology UGI reviewed - small HH and reflux, no ulceration or stricture. Also has presbyesophagus    MEDS, Scheduled    . chlorproMAZINE  10 mg Oral TID  . ferrous sulfate  325 mg Oral BID WC  . levothyroxine  125 mcg Oral QAC breakfast  . pantoprazole  40 mg Oral BID AC  . sodium chloride  3 mL Intravenous Q12H       LOS: 3 days    Currie Paris, MD, [facility], [place] [phone]   08/30/2012 9:59 AM

## 2012-09-02 LAB — CULTURE, BLOOD (ROUTINE X 2): Culture: NO GROWTH

## 2012-09-10 ENCOUNTER — Encounter (INDEPENDENT_AMBULATORY_CARE_PROVIDER_SITE_OTHER): Payer: Self-pay | Admitting: General Surgery

## 2012-09-10 ENCOUNTER — Other Ambulatory Visit (INDEPENDENT_AMBULATORY_CARE_PROVIDER_SITE_OTHER): Payer: Self-pay | Admitting: General Surgery

## 2012-09-10 ENCOUNTER — Ambulatory Visit (INDEPENDENT_AMBULATORY_CARE_PROVIDER_SITE_OTHER): Payer: Medicare PPO | Admitting: General Surgery

## 2012-09-10 VITALS — BP 120/70 | HR 72 | Temp 98.9°F | Resp 12 | Ht 63.0 in | Wt 169.0 lb

## 2012-09-10 DIAGNOSIS — K219 Gastro-esophageal reflux disease without esophagitis: Secondary | ICD-10-CM

## 2012-09-10 DIAGNOSIS — K449 Diaphragmatic hernia without obstruction or gangrene: Secondary | ICD-10-CM

## 2012-09-10 DIAGNOSIS — K644 Residual hemorrhoidal skin tags: Secondary | ICD-10-CM | POA: Insufficient documentation

## 2012-09-10 LAB — CBC
HCT: 35.5 % — ABNORMAL LOW (ref 39.0–52.0)
Hemoglobin: 11.7 g/dL — ABNORMAL LOW (ref 13.0–17.0)
MCH: 26.5 pg (ref 26.0–34.0)
MCHC: 33 g/dL (ref 30.0–36.0)
MCV: 80.3 fL (ref 78.0–100.0)
RDW: 17.8 % — ABNORMAL HIGH (ref 11.5–15.5)

## 2012-09-10 MED ORDER — HYDROCORTISONE ACE-PRAMOXINE 2.5-1 % RE CREA: TOPICAL_CREAM | Freq: Four times a day (QID) | RECTAL | Status: DC

## 2012-09-10 NOTE — Progress Notes (Addendum)
Patient ID: Victor Hunter, male   DOB: [birth year], 79 y.o.   MRN: [id number]  Chief Complaint  Patient presents with  . Hiatal Hernia    HPI Victor Hunter is a 79 y.o. male.  He was referred to me by his primary care physician, Dr. Felecia Shelling, in [place] for consideration of a hiatal hernia repair and antireflux surgery.  The patient has a complex history. Symptomatically he has a 6-12 month history of intermittent episodes of vomiting leading to stridor which sounds like laryngospasm. He's had to go to the emergency room several times and he's been admitted to the hospital twice. His last colonoscopy was several years ago in [place].  On August 9 he presented to the [facility] with abdominal distention and was thought to have a small bowel obstruction. He underwent exploratory laparotomy by Dr. Wende Neighbors and no specific obstructing point was found basically a negative laparotomy. Postoperatively he remained in respiratory failure and was transferred to [facility]. He was treated for resp. failure, community-acquired pneumonia, leukocytosis, malnutrition, possible sepsis, and DVT of the upper extremity on Coumadin. Our practice was consulted  and we followed along from a surgical standpoint until he had recovered GI function and was healing well. Staples were removed at the time of discharge on Sept 3.    He saw Dr. Suzette Battiest in the office for a postop visit.  On December 24 he was admitted to hospital once again for episode of near-syncope, coffee grounds emesis, anemia requiring transfusion, and possible SIRS. Upper endoscopy was performed by Dr. Matthias Hughs on 08/27/2012. This showed a patulous esophageal hiatus hiatus, some Cameron lesions at the GE junction, moderate hiatal hernia. There was no blood or active bleeding. There was no esophagitis. Dr. Matthias Hughs thought that this was the cause of his anemia and coffee ground emesis, nevertheless.  Upper GI was performed on 08/29/2012. This shows a  small hiatal hernia, spontaneous reflux, mild presbyesophagus, a somewhat distended stomach but no gastric outlet obstruction. Discharged 08/30/2012 after 4 day hospitalization.  His aspirin was stopped. He is on double dose Protonix. He has had no further vomiting. He has no heartburn or reflux symptoms. He denies waterbrash. He's had a slow 10 pound weight loss in one year. He has not had any blood work since discharge.  His only other complaint is intermittent painful swollen hemorrhoids. He says they're actually getting better right now   HPI  Past Medical History  Diagnosis Date  . Hypertension   . GERD (gastroesophageal reflux disease)   . Thyroid disease   . Upper GI bleed   . Ileus   . Mallory - Weiss tear   . Chronic anemia   . Enlarged prostate     Elevated PSA  . 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 Opal Sidles benign biopsies February 2005  . History of BPH     s/p TURP 2008  . Blood transfusion without reported diagnosis     Past Surgical History  Procedure Date  . Thyroidectomy   . Cholecystectomy   . Esophagogastroduodenoscopy 08/31/08    Rourk-possible extrinsic impression on midesophagus, noncritical Schatzki's ring, 3 mm fundal gland polyp, multiple gastric fundic polyps not manipulated, small hiatal hernia  . Colonoscopy 10/29/03    rourk-inflammatory polyp from 40 cm removed  . Knee arthroscopy     pt denies  . Transurethral resection of prostate     pt denies, but this is listed  in his past medical records.   . Laparotomy 04/26/2012    Procedure: EXPLORATORY LAPAROTOMY;  Surgeon: Fabio Bering, MD;  Location: [facility];  Service: General;  Laterality: N/A;  . Laparotomy 04/26/2012    Procedure: EXPLORATORY LAPAROTOMY;  Surgeon: Fabio Bering, MD;  Location: [facility];  Service: General;  Laterality: N/A;  . Central venous catheter insertion 04/26/2012    Procedure: INSERTION CENTRAL  LINE ADULT;  Surgeon: Fabio Bering, MD;  Location: [facility];  Service: General;  Laterality: Left;  started at 0919, ended at 0930,        subclavian  . Esophagogastroduodenoscopy 08/27/2012    Procedure: ESOPHAGOGASTRODUODENOSCOPY (EGD);  Surgeon: Florencia Reasons, MD;  Location[facility] ENDOSCOPY;  Service: Endoscopy;  Laterality: N/A;  do at bedside in stepdown unit    Family History  Problem Relation Age of Onset  . Cancer Other   . Heart failure Other   . Liver disease Sister   . Breast cancer Sister   . Hypertension Mother   . Stroke Father   . Colon cancer Neg Hx     Social History History  Substance Use Topics  . Smoking status: Former Smoker -- 0.5 packs/day    Types: Cigarettes    Quit date: 09/10/1992  . Smokeless tobacco: Never Used  . Alcohol Use: No    No Known Allergies  Current Outpatient Prescriptions  Medication Sig Dispense Refill  . calcium carbonate (TUMS - DOSED IN MG ELEMENTAL CALCIUM) 500 MG chewable tablet Chew 2 tablets by mouth daily as needed. Acid Reflux      . chlorproMAZINE (THORAZINE) 10 MG tablet Take 1 tablet (10 mg total) by mouth 3 (three) times daily.  15 tablet  0  . diltiazem (DILACOR XR) 180 MG 24 hr capsule Take 180 mg by mouth daily.      . ferrous sulfate 325 (65 FE) MG tablet Take 1 tablet (325 mg total) by mouth daily with breakfast.  30 tablet  6  . levothyroxine (SYNTHROID, LEVOTHROID) 125 MCG tablet Take 125 mcg by mouth daily.      . pantoprazole (PROTONIX) 40 MG tablet Take 1 tablet (40 mg total) by mouth 2 (two) times daily before a meal.  60 tablet  2  . potassium chloride SA (K-DUR,KLOR-CON) 20 MEQ tablet Take 20 mEq by mouth daily.      . Tamsulosin HCl (FLOMAX) 0.4 MG CAPS Take 0.4 mg by mouth daily.      . hydrocortisone-pramoxine (ANALPRAM-HC) 2.5-1 % rectal cream Place rectally 4 (four) times daily. For irritated and painful hemorrhoids  15 g  2    Review of Systems Review of Systems  Constitutional: Negative for fever,  chills and unexpected weight change.  HENT: Negative for hearing loss, congestion, sore throat, trouble swallowing and voice change.   Eyes: Negative for visual disturbance.  Respiratory: Positive for shortness of breath and stridor. Negative for cough and wheezing.   Cardiovascular: Negative for chest pain, palpitations and leg swelling.  Gastrointestinal: Positive for abdominal distention. Negative for nausea, vomiting, abdominal pain, diarrhea, constipation, blood in stool, anal bleeding and rectal pain.  Genitourinary: Negative for hematuria and difficulty urinating.  Musculoskeletal: Negative for arthralgias.  Skin: Negative for rash and wound.  Neurological: Positive for syncope. Negative for seizures, weakness and headaches.  Hematological: Negative for adenopathy. Does not bruise/bleed easily.  Psychiatric/Behavioral: Negative for confusion.    Blood pressure 120/70, pulse 72, temperature 98.9 F (37.2 C), temperature source Oral, resp. rate  12, height 5\' 3"  (1.6 m), weight 169 lb (76.658 kg).  Physical Exam Physical Exam  Constitutional: He is oriented to person, place, and time. He appears well-developed and well-nourished. No distress.       Appears comfortable. Voice normal. Daughter is with him in the room throughout the encounter.  HENT:  Head: Normocephalic.  Nose: Nose normal.  Mouth/Throat: No oropharyngeal exudate.  Eyes: Conjunctivae normal and EOM are normal. Pupils are equal, round, and reactive to light. Right eye exhibits no discharge. Left eye exhibits no discharge. No scleral icterus.  Neck: Normal range of motion. Neck supple. No JVD present. No tracheal deviation present. No thyromegaly present.  Cardiovascular: Normal rate, regular rhythm, normal heart sounds and intact distal pulses.   No murmur heard. Pulmonary/Chest: Effort normal and breath sounds normal. No stridor. No respiratory distress. He has no wheezes. He has no rales. He exhibits no tenderness.        No wheeze. No stridor.  Abdominal: Soft. Bowel sounds are normal. He exhibits no distension and no mass. There is no tenderness. There is no rebound and no guarding.       Diastases recti. Midline incision healed no obvious hernia. Trocar incisions from cholecystectomy.  Musculoskeletal: Normal range of motion. He exhibits no edema and no tenderness.  Lymphadenopathy:    He has no cervical adenopathy.  Neurological: He is alert and oriented to person, place, and time. He has normal reflexes. Coordination normal.  Skin: Skin is warm and dry. No rash noted. He is not diaphoretic. No erythema. No pallor.  Psychiatric: He has a normal mood and affect. His behavior is normal. Judgment and thought content normal.    Data Reviewed Extensive hospital records. Upper endoscopy report. Upper GI barium study.  Assessment    Hiatal hernia with  documented Cameron erosions, but no evidence of active bleeding or esophagitis on recent EGD.  Recent history of coffee-ground emesis, possibly and probably related to his hiatal hernia  Episodes of hiccoughs, vomiting, stridor, intermittent. Question whether he is silently refluxing and aspirating and having episodes of laryngospasm.  History upper extremity DVT, previously on Coumadin, now discontinued.  Recent aspirin use, indication vague, now discontinued  History  hemorrhoids, intermitantly complicated by pain and swelling  Deficiency anemia, iron  Hypertension  Status post exploratory laparotomy for abdominal distention, 04/12/2012  History laparoscopic cholecystectomy, remote    Plan    It is quite possible that he might benefit from repair of hiatal hernia and antireflux surgery.  Prior to consideration of this he will be referred to Dr. Roetta Sessions in [place] for colonoscopy to rule out occult colon cancer as source of anemia  If the colonoscopy is negative then we will proceed with 24-hour pH monitoring and manometry as  preoperative assessment. After all these tests are done he will return to see me to discuss, once again, the indications, details techniques and risks of hiatal hernia repair  I said a prescription for Analpram-HC 2.5% cream to [facility] in [place] for his hemorrhoids  I encouraged him to continue to take his protonix BID  We will obtain a CBC in the next few days to make sure that his anemia is stable.       Angelia Mould. Derrell Lolling, M.D., [facility] General and Minimally invasive Surgery Breast and Colorectal Surgery Office:   [phone] Pager:   [phone]  09/10/2012, 3:26 PM

## 2012-09-10 NOTE — Patient Instructions (Addendum)
You have a small hiatal hernia, and your recent upper endoscopy shows some erosions that may have caused bleeding and anemia. Your symptoms of pickups and stridor suggestion may be silently regurgitating.  It is also possible that your anemia may be due to another cause such as a colon cancer.  You are advised to continue to take the protonix pill twice a day.  We will check a CBC to see if her hemoglobin is stable.  He will be referred back to Dr. Roetta Sessions in [place] for a colonoscopy to prove that you do not have a colon cancer  If the colonoscopy is negative, we will arrange for you to have an esophageal manometry and esophageal 24-hour pH probe to test your esophagus.  After these tests are done, return to see Dr. Derrell Lolling for discussion as to whether we should go ahead with surgery to repair your hiatal hernia.      Hiatal Hernia A hiatal hernia occurs when a part of the stomach slides above the diaphragm. The diaphragm is the thin muscle separating the belly (abdomen) from the chest. A hiatal hernia can be something you are born with or develop over time. Hiatal hernias may allow stomach acid to flow back into your esophagus, the tube which carries food from your mouth to your stomach. If this acid causes problems it is called GERD (gastro-esophageal reflux disease).  SYMPTOMS  Common symptoms of GERD are heartburn (burning in your chest). This is worse when lying down or bending over. It may also cause belching and indigestion. Some of the things which make GERD worse are:  Increased weight pushes on stomach making acid rise more easily.  Smoking markedly increases acid production.  Alcohol decreases lower esophageal sphincter pressure (valve between stomach and esophagus), allowing acid from stomach into esophagus.  Late evening meals and going to bed with a full stomach increases pressure.  Anything that causes an increase in acid production.  Lower esophageal  sphincter incompetence. DIAGNOSIS  Hiatal hernia is often diagnosed with x-rays of your stomach and small bowel. This is called an UGI (upper gastrointestinal x-ray). Sometimes a gastroscopic procedure is done. This is a procedure where your caregiver uses a flexible instrument to look into the stomach and small bowel. HOME CARE INSTRUCTIONS   Try to achieve and maintain an ideal body weight.  Avoid drinking alcoholic beverages.  Stop smoking.  Put the head of your bed on 4 to 6 inch blocks. This will keep your head and esophagus higher than your stomach. If you cannot use blocks, sleep with several pillows under your head and shoulders.  Over-the-counter medications will decrease acid production. Your caregiver can also prescribe medications for this. Take as directed.  1/2 to 1 teaspoon of an antacid taken every hour while awake, with meals and at bedtime, will neutralize acid.  Do not take aspirin, ibuprofen (Advil or Motrin), or other nonsteroidal anti-inflammatory drugs.  Do not wear tight clothing around your chest or stomach.  Eat smaller meals and eat more frequently. This keeps your stomach from getting too full. Eat slowly.  Do not lie down for 2 or 3 hours after eating. Do not eat or drink anything 1 to 2 hours before going to bed.  Avoid caffeine beverages (colas, coffee, cocoa, tea), fatty foods, citrus fruits and all other foods and drinks that contain acid and that seem to increase the problems.  Avoid bending over, especially after eating. Also avoid straining during bowel movements or when urinating  or lifting things. Anything that increases the pressure in your belly increases the amount of acid that may be pushed up into your esophagus. SEEK IMMEDIATE MEDICAL CARE IF:  There is change in location (pain in arms, neck, jaw, teeth or back) of your pain, or the pain is getting worse.  You also experience nausea, vomiting, sweating (diaphoresis), or shortness of  breath.  You develop continual vomiting, vomit blood or coffee ground material, have bright red blood in your stools, or have black tarry stools. Some of these symptoms could signal other problems such as heart disease. MAKE SURE YOU:   Understand these instructions.  Monitor your condition.  Contact your caregiver if you are not doing well or are getting worse. Document Released: 11/11/2003 Document Revised: 11/13/2011 Document Reviewed: 08/21/2005 [facility] Patient Information 2013 [place], [place].     Nissen Fundoplication Care After Please read the instructions outlined below and refer to this sheet for the next few weeks. These discharge instructions provide you with general information on caring for yourself after you leave the hospital. Your doctor may also give you specific instructions. While your treatment has been planned according to the most current medical practices available, unavoidable complications sometimes happen. If you have any problems or questions after discharge, please call your doctor. ACTIVITY  Take frequent rest periods throughout the day.  Take frequent walks throughout the day. This will help to prevent blood clots.  Continue to do your coughing and deep breathing exercises once you get home. This will help to prevent pneumonia.  No strenuous activities such as heavy lifting, pushing or pulling until after your follow-up visit with your doctor. Do not lift anything heavier than 10 pounds.  Talk with your caregiver about when you may return to work and your exercise routine.  You may shower 2 days after surgery. Pat incisions dry. Do not rub incisions with washcloth or towel.  Do not drive while taking prescription pain medication. NUTRITION  Continue with a liquid diet, or the diet you were directed to take, until your first follow-up visit with your surgeon.  Drink fluids (6-8 glasses a day).  Call your caregiver for persistent nausea (feeling  sick to your stomach), vomiting, bloating or difficulty swallowing. ELIMINATION It is very important not to strain during bowel movements. If constipation should occur, you may:  Take a mild laxative (such as Milk of Magnesia).  Add fruit and bran to your diet.  Drink more fluids.  Call your caregiver if constipation is not relieved. FEVER If you feel feverish or have shaking chills, take your temperature. If it is 102 F (38.9 C) or above, call your caregiver. The fever may mean there is an infection. PAIN CONTROL  If a prescription was given for a pain reliever, please follow your caregiver's directions.  Only take over-the-counter or prescription medicines for pain, discomfort, or fever as directed by your caregiver.  If the pain is not relieved by your medicine, becomes worse, or you have difficulty breathing, call your doctor. INCISION  It is normal for your cuts (incisions) from surgery to have a small amount of drainage for the first 1-2 days. Once the drainage has stopped, leave your incision(s) open to air.  Check your incision(s) and surrounding area daily for any redness, swelling, increased drainage or bleeding. If any of these are present or if the wound edges start to separate, call your doctor.  If you have small adhesive strips in place, they will peel and fall off. (If  these strips are covered with a clear bandage, your doctor will tell you when to remove them.)  If you have staples, your caregiver will remove them at the follow-up appointment. Document Released: 04/13/2004 Document Revised: 11/13/2011 Document Reviewed: 07/18/2007 [facility] Patient Information 2013 [place], [place].

## 2012-09-11 ENCOUNTER — Encounter (INDEPENDENT_AMBULATORY_CARE_PROVIDER_SITE_OTHER): Payer: Self-pay | Admitting: General Surgery

## 2012-09-11 NOTE — Progress Notes (Signed)
Faxed to the attention of Dr. Jena Gauss a copy of the snapshot, referral request and office notes from 09/10/12 visit with Dr. Derrell Lolling to [phone]. Confirmation received.  Based on results the patient will need CBC, esophageal manometry, 24 hour esophageal ph probe.

## 2012-09-24 ENCOUNTER — Encounter: Payer: Self-pay | Admitting: Gastroenterology

## 2012-09-24 ENCOUNTER — Ambulatory Visit (INDEPENDENT_AMBULATORY_CARE_PROVIDER_SITE_OTHER): Payer: Medicare PPO | Admitting: Gastroenterology

## 2012-09-24 VITALS — BP 110/69 | HR 71 | Temp 97.4°F | Ht 63.0 in | Wt 169.8 lb

## 2012-09-24 DIAGNOSIS — Z1211 Encounter for screening for malignant neoplasm of colon: Secondary | ICD-10-CM

## 2012-09-24 MED ORDER — PEG 3350-KCL-NA BICARB-NACL 420 G PO SOLR: 4000.0000 mL | ORAL | Status: DC

## 2012-09-24 NOTE — Progress Notes (Signed)
Referring Provider: Dr. Derrell Lolling with [facility] Primary Care Physician:  Avon Gully, MD Primary Gastroenterologist:  Dr. Jena Gauss   Chief Complaint  Patient presents with  . Colonoscopy    HPI:   [name] is a 79 year old male who presents today at the request of Dr. Derrell Lolling for screening colonoscopy prior to possible hiatal hernia repair and antireflux surgery. His last colonoscopy was in 2005 by Dr. Jena Gauss: pancolonic diverticula, redundant elongated colon, inflammatory polyp. He was last seen by our practice in 2009 in consultation for hiccups. In the interim, he has had exploratory laparotomy by Dr. Leticia Penna in August 2013 secondary to concerns for possible small bowel obstruction; this was negative. He was transferred to [facility] due to respiratory failure, pneumonia, possible sepsis, DVT of upper extremity. Admitted again in December with coffee-ground emesis, transfusion-dependent anemia, possible SIRS. Upper EGD by Dr. Matthias Hughs noted Sheria Lang lesions, no active bleeding. Dr. Derrell Lolling has requested a colonoscopy to rule out occult malignancy prior to reflux surgery.   Pt denies melena, no hematochezia. No abdominal pain. +constipation intermittently. Takes Miralax about every other day. Feels like this does well for his symptoms. No wt loss or lack of appetite. No N/V. Protonix BID, helps. Will occasionally have breakthrough indigestion depending on what he eats.   Past Medical History  Diagnosis Date  . Hypertension   . GERD (gastroesophageal reflux disease)   . Thyroid disease   . Upper GI bleed   . Ileus   . Mallory - Weiss tear   . Chronic anemia   . Enlarged prostate     Elevated PSA  . 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 Opal Sidles benign biopsies February 2005  . History of BPH     s/p TURP 2008  . Blood transfusion without reported diagnosis     Past Surgical History  Procedure  Date  . Thyroidectomy   . Cholecystectomy   . Esophagogastroduodenoscopy 08/31/08    Rourk-possible extrinsic impression on midesophagus, noncritical Schatzki's ring, 3 mm fundal gland polyp, multiple gastric fundic polyps not manipulated, small hiatal hernia : CT chest showed mediastinal mass, eventually underwent thyroidectomy  . Colonoscopy 10/29/03    rourk-inflammatory polyp from 40 cm removed  . Knee arthroscopy     pt denies  . Transurethral resection of prostate     pt denies, but this is listed in his past medical records.   . Laparotomy 04/26/2012    Procedure: EXPLORATORY LAPAROTOMY;  Surgeon: Fabio Bering, MD;  Location: [facility];  Service: General;  Laterality: N/A;  . Laparotomy 04/26/2012    Procedure: EXPLORATORY LAPAROTOMY;  Surgeon: Fabio Bering, MD;  Location: [facility];  Service: General;  Laterality: N/A;  . Central venous catheter insertion 04/26/2012    Procedure: INSERTION CENTRAL LINE ADULT;  Surgeon: Fabio Bering, MD;  Location: [facility];  Service: General;  Laterality: Left;  started at 0919, ended at 0930,        subclavian  . Esophagogastroduodenoscopy 08/27/2012    Dr. Matthias Hughs: moderate sized hiatal hernia, cameron lesions    Current Outpatient Prescriptions  Medication Sig Dispense Refill  . calcium carbonate (TUMS - DOSED IN MG ELEMENTAL CALCIUM) 500 MG chewable tablet Chew 2 tablets by mouth daily as needed. Acid Reflux      . chlorproMAZINE (THORAZINE) 10 MG tablet Take 1 tablet (10 mg total) by mouth 3 (three) times daily.  15 tablet  0  .  diltiazem (DILACOR XR) 180 MG 24 hr capsule Take 180 mg by mouth daily.      . ferrous sulfate 325 (65 FE) MG tablet Take 1 tablet (325 mg total) by mouth daily with breakfast.  30 tablet  6  . levothyroxine (SYNTHROID, LEVOTHROID) 125 MCG tablet Take 125 mcg by mouth daily.      . pantoprazole (PROTONIX) 40 MG tablet Take 1 tablet (40 mg total) by mouth 2 (two) times daily before a meal.  60 tablet  2  . potassium  chloride SA (K-DUR,KLOR-CON) 20 MEQ tablet Take 20 mEq by mouth daily.      . Tamsulosin HCl (FLOMAX) 0.4 MG CAPS Take 0.4 mg by mouth daily.      . hydrocortisone-pramoxine (ANALPRAM-HC) 2.5-1 % rectal cream Place rectally 4 (four) times daily. For irritated and painful hemorrhoids  15 g  2  . polyethylene glycol-electrolytes (TRILYTE) 420 G solution Take 4,000 mLs by mouth as directed.  4000 mL  0    Allergies as of 09/24/2012  . (No Known Allergies)    Family History  Problem Relation Age of Onset  . Cancer Other   . Heart failure Other   . Liver disease Sister   . Breast cancer Sister   . Hypertension Mother   . Stroke Father   . Colon cancer Neg Hx     History   Social History  . Marital Status: Married    Spouse Name: N/A    Number of Children: 4  . Years of Education: N/A   Occupational History  . retired [employer] architect    Social History Main Topics  . Smoking status: Former Smoker -- 0.5 packs/day    Types: Cigarettes    Quit date: 09/10/1992  . Smokeless tobacco: Never Used  . Alcohol Use: No  . Drug Use: No  . Sexually Active: No   Other Topics Concern  . Not on file   Social History Narrative  . No narrative on file    Review of Systems: Gen: SEE HPI CV: Denies chest pain, heart palpitations, syncope, peripheral edema. Resp: Denies shortness of breath with rest, cough, wheezing GI: SEE HPI GU : Denies urinary burning, urinary frequency, urinary incontinence.  MS: Denies joint pain, muscle weakness, cramps, limited movement Derm: Denies rash, itching, dry skin Psych: Denies depression, anxiety, confusion or memory loss  Heme: Denies bruising, bleeding, and enlarged lymph nodes.  Physical Exam: BP 110/69  Pulse 71  Temp 97.4 F (36.3 C) (Oral)  Ht 5\' 3"  (1.6 m)  Wt 169 lb 12.8 oz (77.021 kg)  BMI 30.08 kg/m2 General:   Alert and oriented. Well-developed, well-nourished, pleasant and cooperative. Head:  Normocephalic and atraumatic. Eyes:   Conjunctiva pink, sclera clear, no icterus.    Ears:  Normal auditory acuity. Nose:  No deformity, discharge,  or lesions. Mouth:  No deformity or lesions, mucosa pink and moist.  Neck:  Supple, without mass or thyromegaly. Lungs:  Clear to auscultation bilaterally, without wheezing, rales, or rhonchi.  Heart:  S1, S2 present without murmurs noted.  Abdomen:  +BS, soft, non-tender and non-distended. Without mass or HSM. No rebound or guarding. No hernias noted. Rectal:  Deferred  Msk:  Symmetrical without gross deformities. Normal posture. Extremities:  Without clubbing or edema. Neurologic:  Alert and  oriented x4;  grossly normal neurologically. Skin:  Intact, warm and dry without significant lesions or rashes Cervical Nodes:  No significant cervical adenopathy. Psych:  Alert and cooperative. Normal mood  and affect.

## 2012-09-24 NOTE — Patient Instructions (Addendum)
We have scheduled you for a colonoscopy with Dr. Jena Gauss.  Further recommendations to follow in near future.

## 2012-09-25 ENCOUNTER — Encounter: Payer: Self-pay | Admitting: Gastroenterology

## 2012-09-26 ENCOUNTER — Encounter (HOSPITAL_COMMUNITY): Payer: Self-pay | Admitting: Pharmacy Technician

## 2012-09-26 DIAGNOSIS — Z1211 Encounter for screening for malignant neoplasm of colon: Secondary | ICD-10-CM | POA: Insufficient documentation

## 2012-09-26 NOTE — Progress Notes (Signed)
Faxed to PCP

## 2012-09-26 NOTE — Assessment & Plan Note (Signed)
79 year old male with hx of anemia requiring transfusion Dec 2013, s/p EGD by Dr. Matthias Hughs with Sheria Lang lesions. Lower GI evaluation not completed at that time. Last TCS in by Dr. Jena Gauss in 2005 with an inflammatory polyp. Mr. Puskas is being evaluated for antireflux surgery by Dr. Derrell Lolling, who has requested lower GI evaluation to assess for any occult malignancy prior to this surgery. Pt has no lower GI symptoms other than chronic constipation, which is managed with Miralax prn.   Proceed with TCS with Dr. Jena Gauss in near future: the risks, benefits, and alternatives have been discussed with the patient in detail. The patient states understanding and desires to proceed.

## 2012-10-02 ENCOUNTER — Ambulatory Visit (HOSPITAL_COMMUNITY)
Admission: RE | Admit: 2012-10-02 | Discharge: 2012-10-02 | Disposition: A | Discharge status: Home / Self Care | Payer: Medicare PPO | Source: Ambulatory Visit | Attending: Internal Medicine | Admitting: Internal Medicine

## 2012-10-02 ENCOUNTER — Encounter (HOSPITAL_COMMUNITY): Payer: Self-pay | Admitting: *Deleted

## 2012-10-02 ENCOUNTER — Encounter (HOSPITAL_COMMUNITY)
Admission: RE | Disposition: A | Discharge status: Home / Self Care | Payer: Self-pay | Source: Ambulatory Visit | Attending: Internal Medicine

## 2012-10-02 DIAGNOSIS — Z1211 Encounter for screening for malignant neoplasm of colon: Secondary | ICD-10-CM | POA: Insufficient documentation

## 2012-10-02 DIAGNOSIS — I10 Essential (primary) hypertension: Secondary | ICD-10-CM | POA: Insufficient documentation

## 2012-10-02 HISTORY — PX: COLONOSCOPY: SHX5424

## 2012-10-02 SURGERY — COLONOSCOPY
Anesthesia: Moderate Sedation

## 2012-10-02 MED ORDER — MEPERIDINE HCL 100 MG/ML IJ SOLN
INTRAMUSCULAR | Status: AC
  Filled 2012-10-02: qty 1

## 2012-10-02 MED ORDER — MIDAZOLAM HCL 5 MG/5ML IJ SOLN
INTRAMUSCULAR | Status: AC
  Filled 2012-10-02: qty 10

## 2012-10-02 MED ORDER — ONDANSETRON HCL 4 MG/2ML IJ SOLN
INTRAMUSCULAR | Status: DC | PRN
  Administered 2012-10-02: 4 mg via INTRAVENOUS

## 2012-10-02 MED ORDER — MEPERIDINE HCL 100 MG/ML IJ SOLN
INTRAMUSCULAR | Status: DC | PRN
  Administered 2012-10-02: 25 mg via INTRAVENOUS
  Administered 2012-10-02: 50 mg via INTRAVENOUS

## 2012-10-02 MED ORDER — SODIUM CHLORIDE 0.45 % IV SOLN
INTRAVENOUS | Status: DC
  Administered 2012-10-02: 1000 mL via INTRAVENOUS

## 2012-10-02 MED ORDER — MIDAZOLAM HCL 5 MG/5ML IJ SOLN
INTRAMUSCULAR | Status: DC | PRN
  Administered 2012-10-02 (×2): 2 mg via INTRAVENOUS

## 2012-10-02 MED ORDER — SIMETHICONE 40 MG/0.6ML PO SUSP
ORAL | Status: DC | PRN
  Administered 2012-10-02: 14:00:00

## 2012-10-02 MED ORDER — ONDANSETRON HCL 4 MG/2ML IJ SOLN
INTRAMUSCULAR | Status: AC
  Filled 2012-10-02: qty 2

## 2012-10-02 NOTE — Op Note (Signed)
[facility] [address] [place], [address]   COLONOSCOPY PROCEDURE REPORT  PATIENT: Victor Hunter, Victor Hunter  MR#:         [id number] BIRTHDATE: [birth year] , 78  yrs. old GENDER: Male ENDOSCOPIST: R.  Roetta Sessions, MD FACP FACG REFERRED BY:  Claud Kelp, M.D.  Glenice Laine, M.D. PROCEDURE DATE:  10/02/2012 PROCEDURE:     Screening colonoscopy  INDICATIONS: Average risk colorectal cancer screening  INFORMED CONSENT:  The risks, benefits, alternatives and imponderables including but not limited to bleeding, perforation as well as the possibility of a missed lesion have been reviewed.  The potential for biopsy, lesion removal, etc. have also been discussed.  Questions have been answered.  All parties agreeable. Please see the history and physical in the medical record for more information.  MEDICATIONS: Versed 4 mg IV and Demerol 75 mg IV in divided doses. Zofran 4 mg  DESCRIPTION OF PROCEDURE:  After a digital rectal exam was performed, the EC-[id number] ([id number])  colonoscope was advanced from the anus through the rectum and colon to the area of the cecum, ileocecal valve and appendiceal orifice.  The cecum was deeply intubated.  These structures were well-seen and photographed for the record.  From the level of the cecum and ileocecal valve, the scope was slowly and cautiously withdrawn.  The mucosal surfaces were carefully surveyed utilizing scope tip deflection to facilitate fold flattening as needed.  The scope was pulled down into the rectum where a thorough examination including retroflexion was performed.    FINDINGS:  Adequate preparation. Normal rectum. Somewhat redundant but otherwise normal appearing colon. Normal distal 10 cm of terminal ileal mucosa.  THERAPEUTIC / DIAGNOSTIC MANEUVERS PERFORMED:  None  COMPLICATIONS: None  CECAL WITHDRAWAL TIME:  8 minutes  IMPRESSION:  Normal rectum, colon and terminal ileum  RECOMMENDATIONS:  Would  consider one more colonoscopy in 10 years only if overall health remains satisfactory.   _______________________________ eSigned:  R. Roetta Sessions, MD FACP [facility] 10/02/2012 2:01 PM   CC:

## 2012-10-02 NOTE — Interval H&P Note (Signed)
History and Physical Interval Note:  10/02/2012 1:29 PM  Victor Hunter  has presented today for surgery, with the diagnosis of SCREENING COLONOSCOPY  The various methods of treatment have been discussed with the patient and family. After consideration of risks, benefits and other options for treatment, the patient has consented to  Procedure(s) (LRB) with comments: COLONOSCOPY (N/A) - 1:00 as a surgical intervention .  The patient's history has been reviewed, patient examined, no change in status, stable for surgery.  I have reviewed the patient's chart and labs.  Questions were answered to the patient's satisfaction.     Victor Hunter  Colonoscopy per plan.The risks, benefits, limitations, alternatives and imponderables have been reviewed with the patient. Questions have been answered. All parties are agreeable.

## 2012-10-02 NOTE — H&P (View-Only) (Signed)
 Referring Provider: Dr. Ingram with [facility] Primary Care Physician:  FANTA,TESFAYE, MD Primary Gastroenterologist:  Dr. Rourk   Chief Complaint  Patient presents with  . Colonoscopy    HPI:   Victor Hunter is a 78-year-old male who presents today at the request of Dr. Ingram for screening colonoscopy prior to possible hiatal hernia repair and antireflux surgery. His last colonoscopy was in 2005 by Dr. Rourk: pancolonic diverticula, redundant elongated colon, inflammatory polyp. He was last seen by our practice in 2009 in consultation for hiccups. In the interim, he has had exploratory laparotomy by Dr. Ziegler in August 2013 secondary to concerns for possible small bowel obstruction; this was negative. He was transferred to [place] due to respiratory failure, pneumonia, possible sepsis, DVT of upper extremity. Admitted again in December with coffee-ground emesis, transfusion-dependent anemia, possible SIRS. Upper EGD by Dr. Buccini noted Cameron lesions, no active bleeding. Dr. Ingram has requested a colonoscopy to rule out occult malignancy prior to reflux surgery.   Pt denies melena, no hematochezia. No abdominal pain. +constipation intermittently. Takes Miralax about every other day. Feels like this does well for his symptoms. No wt loss or lack of appetite. No N/V. Protonix BID, helps. Will occasionally have breakthrough indigestion depending on what he eats.   Past Medical History  Diagnosis Date  . Hypertension   . GERD (gastroesophageal reflux disease)   . Thyroid disease   . Upper GI bleed   . Ileus   . Mallory - Weiss tear   . Chronic anemia   . Enlarged prostate     Elevated PSA  . 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 Sweeney benign biopsies February 2005  . History of BPH     s/p TURP 2008  . Blood transfusion without reported diagnosis     Past Surgical History  Procedure  Date  . Thyroidectomy   . Cholecystectomy   . Esophagogastroduodenoscopy 08/31/08    Rourk-possible extrinsic impression on midesophagus, noncritical Schatzki's ring, 3 mm fundal gland polyp, multiple gastric fundic polyps not manipulated, small hiatal hernia : CT chest showed mediastinal mass, eventually underwent thyroidectomy  . Colonoscopy 10/29/03    rourk-inflammatory polyp from 40 cm removed  . Knee arthroscopy     pt denies  . Transurethral resection of prostate     pt denies, but this is listed in his past medical records.   . Laparotomy 04/26/2012    Procedure: EXPLORATORY LAPAROTOMY;  Surgeon: Brent C Ziegler, MD;  Location: [facility];  Service: General;  Laterality: N/A;  . Laparotomy 04/26/2012    Procedure: EXPLORATORY LAPAROTOMY;  Surgeon: Brent C Ziegler, MD;  Location: [facility];  Service: General;  Laterality: N/A;  . Central venous catheter insertion 04/26/2012    Procedure: INSERTION CENTRAL LINE ADULT;  Surgeon: Brent C Ziegler, MD;  Location: [facility];  Service: General;  Laterality: Left;  started at 0919, ended at 0930,        subclavian  . Esophagogastroduodenoscopy 08/27/2012    Dr. Buccini: moderate sized hiatal hernia, cameron lesions    Current Outpatient Prescriptions  Medication Sig Dispense Refill  . calcium carbonate (TUMS - DOSED IN MG ELEMENTAL CALCIUM) 500 MG chewable tablet Chew 2 tablets by mouth daily as needed. Acid Reflux      . chlorproMAZINE (THORAZINE) 10 MG tablet Take 1 tablet (10 mg total) by mouth 3 (three) times daily.  15 tablet  0  .   diltiazem (DILACOR XR) 180 MG 24 hr capsule Take 180 mg by mouth daily.      . ferrous sulfate 325 (65 FE) MG tablet Take 1 tablet (325 mg total) by mouth daily with breakfast.  30 tablet  6  . levothyroxine (SYNTHROID, LEVOTHROID) 125 MCG tablet Take 125 mcg by mouth daily.      . pantoprazole (PROTONIX) 40 MG tablet Take 1 tablet (40 mg total) by mouth 2 (two) times daily before a meal.  60 tablet  2  . potassium  chloride SA (K-DUR,KLOR-CON) 20 MEQ tablet Take 20 mEq by mouth daily.      . Tamsulosin HCl (FLOMAX) 0.4 MG CAPS Take 0.4 mg by mouth daily.      . hydrocortisone-pramoxine (ANALPRAM-HC) 2.5-1 % rectal cream Place rectally 4 (four) times daily. For irritated and painful hemorrhoids  15 g  2  . polyethylene glycol-electrolytes (TRILYTE) 420 G solution Take 4,000 mLs by mouth as directed.  4000 mL  0    Allergies as of 09/24/2012  . (No Known Allergies)    Family History  Problem Relation Age of Onset  . Cancer Other   . Heart failure Other   . Liver disease Sister   . Breast cancer Sister   . Hypertension Mother   . Stroke Father   . Colon cancer Neg Hx     History   Social History  . Marital Status: Married    Spouse Name: N/A    Number of Children: 4  . Years of Education: N/A   Occupational History  . retired truck driver    Social History Main Topics  . Smoking status: Former Smoker -- 0.5 packs/day    Types: Cigarettes    Quit date: 09/10/1992  . Smokeless tobacco: Never Used  . Alcohol Use: No  . Drug Use: No  . Sexually Active: No   Other Topics Concern  . Not on file   Social History Narrative  . No narrative on file    Review of Systems: Gen: SEE HPI CV: Denies chest pain, heart palpitations, syncope, peripheral edema. Resp: Denies shortness of breath with rest, cough, wheezing GI: SEE HPI GU : Denies urinary burning, urinary frequency, urinary incontinence.  MS: Denies joint pain, muscle weakness, cramps, limited movement Derm: Denies rash, itching, dry skin Psych: Denies depression, anxiety, confusion or memory loss  Heme: Denies bruising, bleeding, and enlarged lymph nodes.  Physical Exam: BP 110/69  Pulse 71  Temp 97.4 F (36.3 C) (Oral)  Ht 5' 3" (1.6 m)  Wt 169 lb 12.8 oz (77.021 kg)  BMI 30.08 kg/m2 General:   Alert and oriented. Well-developed, well-nourished, pleasant and cooperative. Head:  Normocephalic and atraumatic. Eyes:   Conjunctiva pink, sclera clear, no icterus.    Ears:  Normal auditory acuity. Nose:  No deformity, discharge,  or lesions. Mouth:  No deformity or lesions, mucosa pink and moist.  Neck:  Supple, without mass or thyromegaly. Lungs:  Clear to auscultation bilaterally, without wheezing, rales, or rhonchi.  Heart:  S1, S2 present without murmurs noted.  Abdomen:  +BS, soft, non-tender and non-distended. Without mass or HSM. No rebound or guarding. No hernias noted. Rectal:  Deferred  Msk:  Symmetrical without gross deformities. Normal posture. Extremities:  Without clubbing or edema. Neurologic:  Alert and  oriented x4;  grossly normal neurologically. Skin:  Intact, warm and dry without significant lesions or rashes Cervical Nodes:  No significant cervical adenopathy. Psych:  Alert and cooperative. Normal mood   and affect.   

## 2012-10-04 ENCOUNTER — Encounter (HOSPITAL_COMMUNITY): Payer: Self-pay | Admitting: Internal Medicine

## 2012-10-15 ENCOUNTER — Ambulatory Visit (INDEPENDENT_AMBULATORY_CARE_PROVIDER_SITE_OTHER): Payer: Medicare PPO | Admitting: General Surgery

## 2012-10-15 ENCOUNTER — Encounter (INDEPENDENT_AMBULATORY_CARE_PROVIDER_SITE_OTHER): Payer: Self-pay | Admitting: General Surgery

## 2012-10-15 VITALS — BP 130/74 | HR 72 | Temp 98.2°F | Resp 18 | Ht 63.0 in | Wt 173.2 lb

## 2012-10-15 DIAGNOSIS — K449 Diaphragmatic hernia without obstruction or gangrene: Secondary | ICD-10-CM

## 2012-10-15 DIAGNOSIS — K219 Gastro-esophageal reflux disease without esophagitis: Secondary | ICD-10-CM

## 2012-10-15 NOTE — Patient Instructions (Addendum)
Colonoscopy with Dr. Jena Gauss looked good. There was no cancer or abnormality and no source of bleeding.  You had had several good weeks without any choking, reflux symptoms, vomiting or trouble breathing.  We have talked about surgery to repair your hiatal hernia. We have told you that your hiatal hernia and reflux quite possibly caused the bleeding that you had last Christmas. We have offered you The option of having a hiatal hernia repair and anti-reflux surgery.  After talking about this today, you have stated that you do not want to have the surgery at this time, and that you want to wait and see how things go.  If you have any further symptoms and change her mind, please call Dr. Jacinto Halim office and we will arrange for you to have the manometry and 24-hour pH probe testing in the GI lab here.  Otherwise, call Dr. Derrell Lolling if further surgical problems arise.

## 2012-10-15 NOTE — Progress Notes (Signed)
Patient ID: Victor Hunter, male   DOB: 1933/11/27, 79 y.o.   MRN: [id number]  Chief Complaint  Patient presents with  . Gastrophageal Reflux    Recheck  . Hiatal Hernia    HPI Victor Hunter is a 79 y.o. male.  He returns for further discussion of his small hiatal hernia and gastroesophageal reflux disease.  He did have an episode of coffee-ground emesis and anemia requiring transfusion on December 24. Upper endoscopy (Dr. Matthias Hughs) showed a patulous gastroesophageal junction, small hiatal hernia, and Cameron lesions at the GE junction although he was not actively bleeding. Upper GI performed on 08/29/2012 shows a small hiatal hernia, spontaneous reflux, mild presbyesophagus, a somewhat distended stomach, the gastric outlet obstruction.  His aspirin was stopped and he has been on double dose Protonix. He's had no dysphagia, no vomiting, no further episodes of choking or stridor, no episodes of laryngospasm, and feels well.  He did go and have a colonoscopy with Dr. Jena Gauss on 10/02/2012 and that was unremarkable. CBC on 12/26 was 9.3 and hemoglobin on January 7 was up to 11.7.  He is feeling well and is skeptical as to whether he wants to have an anti-reflex operation.  HPI  Past Medical History  Diagnosis Date  . Hypertension   . GERD (gastroesophageal reflux disease)   . Thyroid disease   . Upper GI bleed   . Ileus   . Mallory - Weiss tear   . Chronic anemia   . Enlarged prostate     Elevated PSA  . 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 Opal Sidles benign biopsies February 2005  . History of BPH     s/p TURP 2008  . Blood transfusion without reported diagnosis     Past Surgical History  Procedure Laterality Date  . Thyroidectomy    . Cholecystectomy    . Esophagogastroduodenoscopy  08/31/08    Rourk-possible extrinsic impression on midesophagus, noncritical Schatzki's ring, 3 mm fundal gland polyp,  multiple gastric fundic polyps not manipulated, small hiatal hernia : CT chest showed mediastinal mass, eventually underwent thyroidectomy  . Colonoscopy  10/29/03    rourk-inflammatory polyp from 40 cm removed  . Knee arthroscopy      pt denies  . Transurethral resection of prostate      pt denies, but this is listed in his past medical records.   . Laparotomy  04/26/2012    Procedure: EXPLORATORY LAPAROTOMY;  Surgeon: Fabio Bering, MD;  Location: [facility];  Service: General;  Laterality: N/A;  . Laparotomy  04/26/2012    Procedure: EXPLORATORY LAPAROTOMY;  Surgeon: Fabio Bering, MD;  Location: [facility];  Service: General;  Laterality: N/A;  . Central venous catheter insertion  04/26/2012    Procedure: INSERTION CENTRAL LINE ADULT;  Surgeon: Fabio Bering, MD;  Location: [facility];  Service: General;  Laterality: Left;  started at 0919, ended at 0930,        subclavian  . Esophagogastroduodenoscopy  08/27/2012    Dr. Matthias Hughs: moderate sized hiatal hernia, cameron lesions  . Colonoscopy  10/02/2012    Procedure: COLONOSCOPY;  Surgeon: Corbin Ade, MD;  Location: [facility];  Service: Endoscopy;  Laterality: N/A;  1:00    Family History  Problem Relation Age of Onset  . Cancer Other   . Heart failure Other   . Liver disease Sister   . Breast cancer Sister   .  Hypertension Mother   . Stroke Father   . Colon cancer Neg Hx     Social History History  Substance Use Topics  . Smoking status: Former Smoker -- 0.50 packs/day    Types: Cigarettes    Quit date: 09/10/1992  . Smokeless tobacco: Never Used  . Alcohol Use: No    No Known Allergies  Current Outpatient Prescriptions  Medication Sig Dispense Refill  . Tamsulosin HCl (FLOMAX) 0.4 MG CAPS daily.      . calcium carbonate (TUMS - DOSED IN MG ELEMENTAL CALCIUM) 500 MG chewable tablet Chew 2 tablets by mouth daily as needed. Acid Reflux      . chlorproMAZINE (THORAZINE) 10 MG tablet Take 1 tablet (10 mg total) by mouth 3  (three) times daily.  15 tablet  0  . diltiazem (DILACOR XR) 180 MG 24 hr capsule Take 180 mg by mouth daily.      . ferrous sulfate 325 (65 FE) MG tablet Take 1 tablet (325 mg total) by mouth daily with breakfast.  30 tablet  6  . hydrochlorothiazide (HYDRODIURIL) 25 MG tablet Take 1 tablet by mouth Daily.      . hydrocortisone-pramoxine (ANALPRAM-HC) 2.5-1 % rectal cream Place rectally 4 (four) times daily. For irritated and painful hemorrhoids  15 g  2  . levothyroxine (SYNTHROID, LEVOTHROID) 125 MCG tablet Take 125 mcg by mouth daily.      . pantoprazole (PROTONIX) 40 MG tablet Take 1 tablet (40 mg total) by mouth 2 (two) times daily before a meal.  60 tablet  2  . polyethylene glycol-electrolytes (TRILYTE) 420 G solution Take 4,000 mLs by mouth as directed.  4000 mL  0  . potassium chloride SA (K-DUR,KLOR-CON) 20 MEQ tablet Take 20 mEq by mouth daily.       No current facility-administered medications for this visit.    Review of Systems Review of Systems  Constitutional: Negative for fever, chills and unexpected weight change.  HENT: Negative for hearing loss, congestion, sore throat, trouble swallowing and voice change.   Eyes: Negative for visual disturbance.  Respiratory: Negative for cough and wheezing.   Cardiovascular: Negative for chest pain, palpitations and leg swelling.  Gastrointestinal: Negative for nausea, vomiting, abdominal pain, diarrhea, constipation, blood in stool, abdominal distention, anal bleeding and rectal pain.  Genitourinary: Negative for hematuria and difficulty urinating.  Musculoskeletal: Negative for arthralgias.  Skin: Negative for rash and wound.  Neurological: Negative for seizures, syncope, weakness and headaches.  Hematological: Negative for adenopathy. Does not bruise/bleed easily.  Psychiatric/Behavioral: Negative for confusion.    Blood pressure 130/74, pulse 72, temperature 98.2 F (36.8 C), temperature source Temporal, resp. rate 18, height  5\' 3"  (1.6 m), weight 173 lb 4 oz (78.586 kg).  Physical Exam Physical Exam  Constitutional: He is oriented to person, place, and time. He appears well-developed and well-nourished. No distress.  Alert. Comfortable. Voice is normal.  HENT:  Head: Normocephalic.  Nose: Nose normal.  Mouth/Throat: No oropharyngeal exudate.  Eyes: Conjunctivae and EOM are normal. Pupils are equal, round, and reactive to light. Right eye exhibits no discharge. Left eye exhibits no discharge. No scleral icterus.  Neck: Normal range of motion. Neck supple. No JVD present. No tracheal deviation present. No thyromegaly present.  Cardiovascular: Normal rate, regular rhythm, normal heart sounds and intact distal pulses.   No murmur heard. Pulmonary/Chest: Effort normal and breath sounds normal. No stridor. No respiratory distress. He has no wheezes. He has no rales. He exhibits no tenderness.  No wheeze. No stridor.  Abdominal: Soft. Bowel sounds are normal. He exhibits no distension and no mass. There is no tenderness. There is no rebound and no guarding.  Somewhat obese. Diastases recti. Midline incision but no obvious hernia. Trocar incisions from cholecystectomy.  Musculoskeletal: Normal range of motion. He exhibits no edema and no tenderness.  Lymphadenopathy:    He has no cervical adenopathy.  Neurological: He is alert and oriented to person, place, and time. He has normal reflexes. Coordination normal.  Skin: Skin is warm and dry. No rash noted. He is not diaphoretic. No erythema. No pallor.  Psychiatric: He has a normal mood and affect. His behavior is normal. Judgment and thought content normal.    Data Reviewed Hospital records. Office notes. Colonoscopy report.  Assessment    Hiatal hernia with document Sheria Lang erosions, presbyesophagus, but no evidence of active bleeding or esophagitis 1 EGD December 2013.  Recent history of coffee-ground emesis, probably related to his hiatal hernia  Episodes of  hiccups, vomiting, stridor, resolved. Question whether he has some refluxing and aspirating it having episodes laryngospasm  Normal colonoscopy, date 10/02/2012  History of  Upper extremity  DVT, previously on Coumadin, now discontinued  Recent aspirin use, indication vague, now discontinued  Iron deficiency anemia, improving  Hypertension  Status post expiratory laparotomy for abdominal distention, but without obstructing point found, and it date 04/12/2012  Remote history laparoscopic cholecystectomy     Plan    I had a long discussion with the patient, and I offered him the option of anti-reflux surgery. I told him that his recent episode of bleeding was probably due to his hiatal hernia and reflux. I advised him that the next step was a manometry and 24-hour pH monitoring. We talked about this at length and I discussed the indications and techniques of the surgery once again.  He stated that he did not want to have surgery at this time. He states that he feels fine,Is asymptomatic, and would like to wait and see how things go.  After lengthy discussion I told him to continue his Protonix twice a day, never take aspirin or Coumadin again, and return to see me if his symptoms recur or if he changes his mind.  If he needs to have anti-reflux surgery in the future,  I do think we should ask Dr. Matthias Hughs to supervise a manometry and 24-hour pH monitoring pre op        Angelia Mould. Derrell Lolling, M.D., [facility] General and Minimally invasive Surgery Breast and Colorectal Surgery Office:   [phone] Pager:   [phone]  10/15/2012, 1:58 PM

## 2012-10-17 ENCOUNTER — Encounter (INDEPENDENT_AMBULATORY_CARE_PROVIDER_SITE_OTHER): Payer: Medicare PPO | Admitting: General Surgery

## 2012-10-19 ENCOUNTER — Other Ambulatory Visit: Payer: Self-pay

## 2013-04-09 ENCOUNTER — Other Ambulatory Visit: Payer: Self-pay

## 2013-06-25 IMAGING — CR DG CHEST 1V
1 series · 1 of 1 positions shown · non-contrast
Comparison: Acute abdominal series 07/29/2012

CLINICAL DATA: Fall

CHEST - 1 VIEW

[view not recorded]
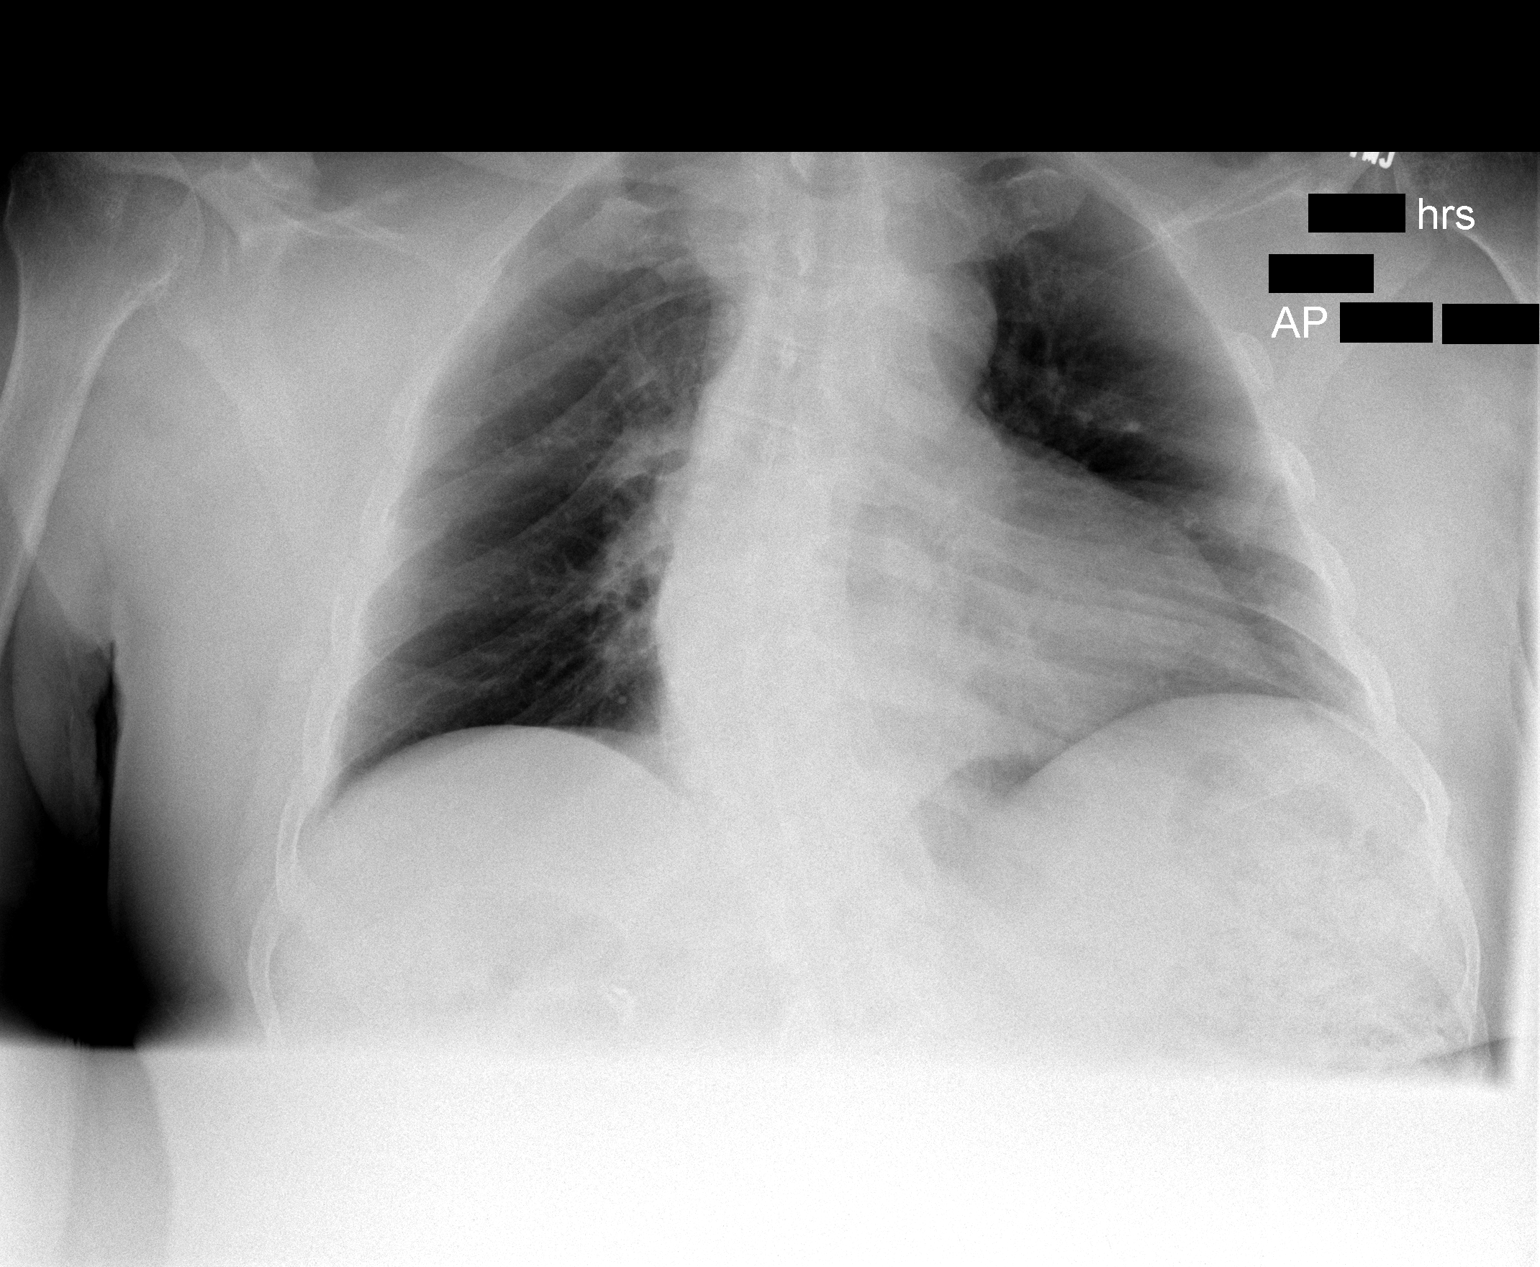

[1 of 1 positions shown; findings below may reference images not displayed]

FINDINGS: Heart and mediastinal contours are stable.  The lungs are
clear. No visible pleural effusion.  There are surgical clips at
the thoracic inlet bilaterally.  There is a moderate convex right
scoliosis of the thoracic spine, stable.  Cholecystectomy clips in
the right upper quadrant.
IMPRESSION: No acute cardiopulmonary disease.

## 2013-07-10 ENCOUNTER — Other Ambulatory Visit: Payer: Self-pay

## 2014-09-17 ENCOUNTER — Encounter (HOSPITAL_COMMUNITY): Payer: Self-pay | Admitting: General Surgery

## 2014-11-03 DIAGNOSIS — H4011X2 Primary open-angle glaucoma, moderate stage: Secondary | ICD-10-CM | POA: Diagnosis not present

## 2014-12-01 DIAGNOSIS — N529 Male erectile dysfunction, unspecified: Secondary | ICD-10-CM | POA: Diagnosis not present

## 2014-12-01 DIAGNOSIS — E039 Hypothyroidism, unspecified: Secondary | ICD-10-CM | POA: Diagnosis not present

## 2014-12-01 DIAGNOSIS — E669 Obesity, unspecified: Secondary | ICD-10-CM | POA: Diagnosis not present

## 2014-12-01 DIAGNOSIS — I10 Essential (primary) hypertension: Secondary | ICD-10-CM | POA: Diagnosis not present

## 2015-02-15 DIAGNOSIS — I10 Essential (primary) hypertension: Secondary | ICD-10-CM | POA: Diagnosis not present

## 2015-02-15 DIAGNOSIS — E669 Obesity, unspecified: Secondary | ICD-10-CM | POA: Diagnosis not present

## 2015-02-15 DIAGNOSIS — E039 Hypothyroidism, unspecified: Secondary | ICD-10-CM | POA: Diagnosis not present

## 2015-02-15 DIAGNOSIS — K219 Gastro-esophageal reflux disease without esophagitis: Secondary | ICD-10-CM | POA: Diagnosis not present

## 2015-04-06 DIAGNOSIS — H25013 Cortical age-related cataract, bilateral: Secondary | ICD-10-CM | POA: Diagnosis not present

## 2015-04-06 DIAGNOSIS — H4011X1 Primary open-angle glaucoma, mild stage: Secondary | ICD-10-CM | POA: Diagnosis not present

## 2015-04-06 DIAGNOSIS — H2513 Age-related nuclear cataract, bilateral: Secondary | ICD-10-CM | POA: Diagnosis not present

## 2015-05-18 DIAGNOSIS — E042 Nontoxic multinodular goiter: Secondary | ICD-10-CM | POA: Diagnosis not present

## 2015-05-18 DIAGNOSIS — N401 Enlarged prostate with lower urinary tract symptoms: Secondary | ICD-10-CM | POA: Diagnosis not present

## 2015-05-18 DIAGNOSIS — Z23 Encounter for immunization: Secondary | ICD-10-CM | POA: Diagnosis not present

## 2015-05-18 DIAGNOSIS — E079 Disorder of thyroid, unspecified: Secondary | ICD-10-CM | POA: Diagnosis not present

## 2015-05-18 DIAGNOSIS — I10 Essential (primary) hypertension: Secondary | ICD-10-CM | POA: Diagnosis not present

## 2015-05-18 DIAGNOSIS — E785 Hyperlipidemia, unspecified: Secondary | ICD-10-CM | POA: Diagnosis not present

## 2015-05-18 DIAGNOSIS — E669 Obesity, unspecified: Secondary | ICD-10-CM | POA: Diagnosis not present

## 2015-05-18 DIAGNOSIS — E039 Hypothyroidism, unspecified: Secondary | ICD-10-CM | POA: Diagnosis not present

## 2015-06-18 ENCOUNTER — Emergency Department (HOSPITAL_COMMUNITY)
Admission: EM | Admit: 2015-06-18 | Discharge: 2015-06-19 | Disposition: A | Discharge status: Home / Self Care | Payer: Medicare PPO | Attending: Emergency Medicine | Admitting: Emergency Medicine

## 2015-06-18 ENCOUNTER — Encounter (HOSPITAL_COMMUNITY): Payer: Self-pay

## 2015-06-18 DIAGNOSIS — N400 Enlarged prostate without lower urinary tract symptoms: Secondary | ICD-10-CM | POA: Diagnosis not present

## 2015-06-18 DIAGNOSIS — E079 Disorder of thyroid, unspecified: Secondary | ICD-10-CM | POA: Insufficient documentation

## 2015-06-18 DIAGNOSIS — Z79899 Other long term (current) drug therapy: Secondary | ICD-10-CM | POA: Insufficient documentation

## 2015-06-18 DIAGNOSIS — K219 Gastro-esophageal reflux disease without esophagitis: Secondary | ICD-10-CM | POA: Insufficient documentation

## 2015-06-18 DIAGNOSIS — Z87891 Personal history of nicotine dependence: Secondary | ICD-10-CM | POA: Diagnosis not present

## 2015-06-18 DIAGNOSIS — N3090 Cystitis, unspecified without hematuria: Secondary | ICD-10-CM | POA: Diagnosis not present

## 2015-06-18 DIAGNOSIS — R319 Hematuria, unspecified: Secondary | ICD-10-CM | POA: Diagnosis present

## 2015-06-18 DIAGNOSIS — Z862 Personal history of diseases of the blood and blood-forming organs and certain disorders involving the immune mechanism: Secondary | ICD-10-CM | POA: Insufficient documentation

## 2015-06-18 DIAGNOSIS — I10 Essential (primary) hypertension: Secondary | ICD-10-CM | POA: Insufficient documentation

## 2015-06-18 LAB — URINALYSIS, ROUTINE W REFLEX MICROSCOPIC
BILIRUBIN URINE: NEGATIVE
GLUCOSE, UA: NEGATIVE mg/dL
Ketones, ur: 15 mg/dL — AB
Leukocytes, UA: NEGATIVE
Nitrite: NEGATIVE
PH: 5 (ref 5.0–8.0)
Protein, ur: 30 mg/dL — AB
SPECIFIC GRAVITY, URINE: 1.024 (ref 1.005–1.030)
Urobilinogen, UA: 0.2 mg/dL (ref 0.0–1.0)

## 2015-06-18 LAB — URINE MICROSCOPIC-ADD ON

## 2015-06-18 NOTE — ED Provider Notes (Signed)
CSN: [id number]     Arrival date & time 06/18/15  2142 History  By signing my name below, I, Victor Hunter, attest that this documentation has been prepared under the direction and in the presence of Victor Rice, MD. Electronically Signed: Helane Hunter, ED Scribe. 06/19/2015. 12:19 AM.    Chief Complaint  Patient presents with  . Hematuria   The history is provided by the patient. No language interpreter was used.   HPI Comments: Victor Hunter is a 81 y.o. male who presents to the Emergency Department complaining of bright red hematuria onset this morning. He states he has had a similar episode once before, but notes that this occurred a long time ago. He has seen a urologist in [place]. He notes that he was on baby aspirin, but has since been taken off of it by his PCP. Pt denies passing blood clots, dysuria, fever, chills, lightheadedness, difficulty urinating, urgency, leg swelling, and SOB.  Past Medical History  Diagnosis Date  . Hypertension   . GERD (gastroesophageal reflux disease)   . Thyroid disease   . Upper GI bleed   . Ileus ([place])   . Mallory - Weiss tear   . Chronic anemia   . Enlarged prostate     Elevated PSA  . 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 Christoper Fabian benign biopsies February 2005  . History of BPH     s/p TURP 2008  . Blood transfusion without reported diagnosis    Past Surgical History  Procedure Laterality Date  . Thyroidectomy    . Cholecystectomy    . Esophagogastroduodenoscopy  08/31/08    Rourk-possible extrinsic impression on midesophagus, noncritical Schatzki's ring, 3 mm fundal gland polyp, multiple gastric fundic polyps not manipulated, small hiatal hernia : CT chest showed mediastinal mass, eventually underwent thyroidectomy  . Colonoscopy  10/29/03    rourk-inflammatory polyp from 40 cm removed  . Knee arthroscopy      pt denies  . Transurethral resection  of prostate      pt denies, but this is listed in his past medical records.   . Laparotomy  04/26/2012    Procedure: EXPLORATORY LAPAROTOMY;  Surgeon: Donato Heinz, MD;  Location: [facility];  Service: General;  Laterality: N/A;  . Central venous catheter insertion  04/26/2012    Procedure: INSERTION CENTRAL LINE ADULT;  Surgeon: Donato Heinz, MD;  Location: [facility];  Service: General;  Laterality: Left;  started at 0919, ended at 0930,        subclavian  . Esophagogastroduodenoscopy  08/27/2012    Dr. Cristina Gong: moderate sized hiatal hernia, cameron lesions  . Colonoscopy  10/02/2012    Procedure: COLONOSCOPY;  Surgeon: Daneil Dolin, MD;  Location: [facility];  Service: Endoscopy;  Laterality: N/A;  1:00   Family History  Problem Relation Age of Onset  . Cancer Other   . Heart failure Other   . Liver disease Sister   . Breast cancer Sister   . Hypertension Mother   . Stroke Father   . Colon cancer Neg Hx    Social History  Substance Use Topics  . Smoking status: Former Smoker -- 0.50 packs/day    Types: Cigarettes    Quit date: 09/10/1992  . Smokeless tobacco: Never Used  . Alcohol Use: No    Review of Systems  Constitutional: Negative for fever and chills.  Respiratory: Negative for shortness of breath.  Cardiovascular: Negative for leg swelling.  Gastrointestinal: Negative for nausea, vomiting, abdominal pain and abdominal distention.  Genitourinary: Positive for hematuria. Negative for dysuria, urgency, frequency, flank pain, difficulty urinating and penile pain.  Musculoskeletal: Negative for back pain.  Skin: Negative for rash and wound.  Neurological: Negative for dizziness, weakness, light-headedness and numbness.  All other systems reviewed and are negative.   Allergies  Review of patient's allergies indicates no known allergies.  Home Medications   Prior to Admission medications   Medication Sig Start Date End Date Taking? Authorizing Provider  calcium  carbonate (TUMS - DOSED IN MG ELEMENTAL CALCIUM) 500 MG chewable tablet Chew 2 tablets by mouth daily as needed. Acid Reflux   Yes Historical Provider, MD  diltiazem (DILACOR XR) 180 MG 24 hr capsule Take 180 mg by mouth daily.   Yes Historical Provider, MD  ferrous sulfate 325 (65 FE) MG tablet Take 1 tablet (325 mg total) by mouth daily with breakfast. 08/30/12  Yes Reyne Dumas, MD  latanoprost (XALATAN) 0.005 % ophthalmic solution Place 1 drop into both eyes every morning.   Yes Historical Provider, MD  levothyroxine (SYNTHROID, LEVOTHROID) 112 MCG tablet Take 112 mcg by mouth daily before breakfast.   Yes Historical Provider, MD  pantoprazole (PROTONIX) 40 MG tablet Take 1 tablet (40 mg total) by mouth 2 (two) times daily before a meal. Patient taking differently: Take 40 mg by mouth daily.  08/30/12  Yes Reyne Dumas, MD  potassium chloride SA (K-DUR,KLOR-CON) 20 MEQ tablet Take 20 mEq by mouth daily.   Yes Historical Provider, MD  Tamsulosin HCl (FLOMAX) 0.4 MG CAPS Take 0.4 mg by mouth daily.  08/06/12  Yes Historical Provider, MD  cephALEXin (KEFLEX) 500 MG capsule Take 1 capsule (500 mg total) by mouth 2 (two) times daily. 06/19/15   Victor Rice, MD   BP 163/77 mmHg  Pulse 55  Temp(Src) 98 F (36.7 C) (Oral)  Resp 20  Ht 4\' 9"  (1.448 m)  Wt 185 lb 11.2 oz (84.233 kg)  BMI 40.17 kg/m2  SpO2 100% Physical Exam  Constitutional: He is oriented to person, place, and time. He appears well-developed and well-nourished. No distress.  HENT:  Head: Normocephalic and atraumatic.  Mouth/Throat: Oropharynx is clear and moist.  Eyes: EOM are normal. Pupils are equal, round, and reactive to light.  Neck: Normal range of motion. Neck supple.  Cardiovascular: Normal rate and regular rhythm.  Exam reveals no gallop and no friction rub.   No murmur heard. Pulmonary/Chest: Effort normal and breath sounds normal. No respiratory distress. He has no wheezes. He has no rales.  Abdominal: Soft.  Bowel sounds are normal. He exhibits no distension and no mass. There is no tenderness. There is no rebound and no guarding.  Musculoskeletal: Normal range of motion. He exhibits no edema or tenderness.  No CVA tenderness bilaterally.  Neurological: He is alert and oriented to person, place, and time.  Moves all extremities without deficit. Sensation is fully intact.  Skin: Skin is warm and dry. No rash noted. No erythema.  Psychiatric: He has a normal mood and affect. His behavior is normal.  Nursing note and vitals reviewed.   ED Course  Procedures  DIAGNOSTIC STUDIES: Oxygen Saturation is 97% on RA, adequate by my interpretation.    COORDINATION OF CARE: 12:10 AM - Discussed probable UTI with possible cystitis. Discussed plans to order diagnostic studies and antibiotics. Advised to f/u with PCP in a few days. Pt advised of plan for treatment  and pt agrees.  Labs Review Labs Reviewed  URINALYSIS, ROUTINE W REFLEX MICROSCOPIC (NOT AT [facility]) - Abnormal; Notable for the following:    Color, Urine AMBER (*)    APPearance TURBID (*)    Hgb urine dipstick LARGE (*)    Ketones, ur 15 (*)    Protein, ur 30 (*)    All other components within normal limits  URINE MICROSCOPIC-ADD ON - Abnormal; Notable for the following:    Bacteria, UA MANY (*)    All other components within normal limits  I-STAT CHEM 8, ED - Abnormal; Notable for the following:    Calcium, Ion 1.11 (*)    All other components within normal limits    Imaging Review No results found. I have personally reviewed and evaluated these images and lab results as part of my medical decision-making.   EKG Interpretation None      MDM   Final diagnoses:  Cystitis    I, Azarel Banner, personally performed the services described in this documentation. All medical record entries made by the scribe were at my direction and in my presence.  I have reviewed the chart and discharge instructions and agree that the record  reflects my personal performance and is accurate and complete. Kraven Calk.  06/20/2015. 5:35 AM.   Patient maintains normal vital signs in emergency department. Questionable UTI on UA. We'll treat with ABX and follow-up with urology. Return precautions given.  Victor Rice, MD 06/20/15 [phone]

## 2015-06-18 NOTE — ED Notes (Signed)
Pt started having blood in his urine this morning. States it comes and goes. Daughter states that there was a lot in it this morning. Denies any pain. Denies any hx of kidney stones.

## 2015-06-19 LAB — I-STAT CHEM 8, ED
BUN: 17 mg/dL (ref 6–20)
CREATININE: 1.1 mg/dL (ref 0.61–1.24)
Calcium, Ion: 1.11 mmol/L — ABNORMAL LOW (ref 1.13–1.30)
Chloride: 102 mmol/L (ref 101–111)
Glucose, Bld: 98 mg/dL (ref 65–99)
HEMATOCRIT: 44 % (ref 39.0–52.0)
HEMOGLOBIN: 15 g/dL (ref 13.0–17.0)
Potassium: 3.8 mmol/L (ref 3.5–5.1)
Sodium: 140 mmol/L (ref 135–145)
TCO2: 26 mmol/L (ref 0–100)

## 2015-06-19 MED ORDER — CEPHALEXIN 250 MG PO CAPS
500.0000 mg | ORAL_CAPSULE | Freq: Once | ORAL | Status: AC
  Administered 2015-06-19: 500 mg via ORAL
  Filled 2015-06-19: qty 2

## 2015-06-19 MED ORDER — CEPHALEXIN 500 MG PO CAPS: 500.0000 mg | ORAL_CAPSULE | Freq: Two times a day (BID) | ORAL | Status: DC

## 2015-06-19 NOTE — Discharge Instructions (Signed)

## 2015-06-21 DIAGNOSIS — R319 Hematuria, unspecified: Secondary | ICD-10-CM | POA: Diagnosis not present

## 2015-06-21 DIAGNOSIS — N401 Enlarged prostate with lower urinary tract symptoms: Secondary | ICD-10-CM | POA: Diagnosis not present

## 2015-06-21 DIAGNOSIS — I10 Essential (primary) hypertension: Secondary | ICD-10-CM | POA: Diagnosis not present

## 2015-06-21 DIAGNOSIS — N390 Urinary tract infection, site not specified: Secondary | ICD-10-CM | POA: Diagnosis not present

## 2015-07-19 DIAGNOSIS — H401131 Primary open-angle glaucoma, bilateral, mild stage: Secondary | ICD-10-CM | POA: Diagnosis not present

## 2015-07-20 ENCOUNTER — Encounter (HOSPITAL_COMMUNITY): Payer: Self-pay | Admitting: Emergency Medicine

## 2015-07-20 ENCOUNTER — Inpatient Hospital Stay (HOSPITAL_COMMUNITY): Payer: Medicare PPO

## 2015-07-20 ENCOUNTER — Inpatient Hospital Stay (HOSPITAL_COMMUNITY)
Admission: EM | Admit: 2015-07-20 | Discharge: 2015-07-23 | DRG: 728 | Disposition: A | Discharge status: Home / Self Care | Payer: Medicare PPO | Attending: Oncology | Admitting: Oncology

## 2015-07-20 ENCOUNTER — Emergency Department (HOSPITAL_COMMUNITY): Payer: Medicare PPO

## 2015-07-20 DIAGNOSIS — E039 Hypothyroidism, unspecified: Secondary | ICD-10-CM | POA: Diagnosis present

## 2015-07-20 DIAGNOSIS — K219 Gastro-esophageal reflux disease without esophagitis: Secondary | ICD-10-CM | POA: Diagnosis not present

## 2015-07-20 DIAGNOSIS — N400 Enlarged prostate without lower urinary tract symptoms: Secondary | ICD-10-CM | POA: Diagnosis present

## 2015-07-20 DIAGNOSIS — N5082 Scrotal pain: Secondary | ICD-10-CM | POA: Diagnosis not present

## 2015-07-20 DIAGNOSIS — R300 Dysuria: Secondary | ICD-10-CM | POA: Diagnosis present

## 2015-07-20 DIAGNOSIS — N50812 Left testicular pain: Secondary | ICD-10-CM | POA: Insufficient documentation

## 2015-07-20 DIAGNOSIS — R972 Elevated prostate specific antigen [PSA]: Secondary | ICD-10-CM | POA: Diagnosis not present

## 2015-07-20 DIAGNOSIS — E876 Hypokalemia: Secondary | ICD-10-CM | POA: Diagnosis not present

## 2015-07-20 DIAGNOSIS — Z87891 Personal history of nicotine dependence: Secondary | ICD-10-CM | POA: Diagnosis not present

## 2015-07-20 DIAGNOSIS — Z8639 Personal history of other endocrine, nutritional and metabolic disease: Secondary | ICD-10-CM | POA: Insufficient documentation

## 2015-07-20 DIAGNOSIS — R319 Hematuria, unspecified: Secondary | ICD-10-CM | POA: Diagnosis not present

## 2015-07-20 DIAGNOSIS — E890 Postprocedural hypothyroidism: Secondary | ICD-10-CM | POA: Insufficient documentation

## 2015-07-20 DIAGNOSIS — H409 Unspecified glaucoma: Secondary | ICD-10-CM | POA: Diagnosis present

## 2015-07-20 DIAGNOSIS — I10 Essential (primary) hypertension: Secondary | ICD-10-CM | POA: Diagnosis not present

## 2015-07-20 DIAGNOSIS — R509 Fever, unspecified: Secondary | ICD-10-CM | POA: Diagnosis not present

## 2015-07-20 DIAGNOSIS — B9620 Unspecified Escherichia coli [E. coli] as the cause of diseases classified elsewhere: Secondary | ICD-10-CM | POA: Diagnosis present

## 2015-07-20 DIAGNOSIS — N451 Epididymitis: Secondary | ICD-10-CM | POA: Diagnosis not present

## 2015-07-20 DIAGNOSIS — K567 Ileus, unspecified: Secondary | ICD-10-CM | POA: Diagnosis present

## 2015-07-20 DIAGNOSIS — N390 Urinary tract infection, site not specified: Secondary | ICD-10-CM | POA: Diagnosis present

## 2015-07-20 DIAGNOSIS — R531 Weakness: Secondary | ICD-10-CM | POA: Diagnosis not present

## 2015-07-20 DIAGNOSIS — Z79899 Other long term (current) drug therapy: Secondary | ICD-10-CM | POA: Diagnosis not present

## 2015-07-20 DIAGNOSIS — N5089 Other specified disorders of the male genital organs: Secondary | ICD-10-CM

## 2015-07-20 DIAGNOSIS — K317 Polyp of stomach and duodenum: Secondary | ICD-10-CM | POA: Insufficient documentation

## 2015-07-20 DIAGNOSIS — N179 Acute kidney failure, unspecified: Secondary | ICD-10-CM | POA: Diagnosis present

## 2015-07-20 DIAGNOSIS — D638 Anemia in other chronic diseases classified elsewhere: Secondary | ICD-10-CM | POA: Diagnosis not present

## 2015-07-20 LAB — URINE MICROSCOPIC-ADD ON: Squamous Epithelial / LPF: NONE SEEN

## 2015-07-20 LAB — COMPREHENSIVE METABOLIC PANEL
ALBUMIN: 3.3 g/dL — AB (ref 3.5–5.0)
ALK PHOS: 125 U/L (ref 38–126)
ALT: 39 U/L (ref 17–63)
ANION GAP: 8 (ref 5–15)
AST: 49 U/L — ABNORMAL HIGH (ref 15–41)
BILIRUBIN TOTAL: 0.4 mg/dL (ref 0.3–1.2)
BUN: 24 mg/dL — AB (ref 6–20)
CALCIUM: 8.4 mg/dL — AB (ref 8.9–10.3)
CO2: 22 mmol/L (ref 22–32)
Chloride: 106 mmol/L (ref 101–111)
Creatinine, Ser: 1.79 mg/dL — ABNORMAL HIGH (ref 0.61–1.24)
GFR calc Af Amer: 40 mL/min — ABNORMAL LOW (ref >60)
GFR calc non Af Amer: 34 mL/min — ABNORMAL LOW (ref >60)
GLUCOSE: 146 mg/dL — AB (ref 65–99)
Potassium: 4.4 mmol/L (ref 3.5–5.1)
Sodium: 136 mmol/L (ref 135–145)
TOTAL PROTEIN: 7.2 g/dL (ref 6.5–8.1)

## 2015-07-20 LAB — I-STAT CG4 LACTIC ACID, ED
LACTIC ACID, VENOUS: 1.73 mmol/L (ref 0.5–2.0)
Lactic Acid, Venous: 0.77 mmol/L (ref 0.5–2.0)

## 2015-07-20 LAB — CBC
HCT: 40.8 % (ref 39.0–52.0)
HEMOGLOBIN: 12.9 g/dL — AB (ref 13.0–17.0)
MCH: 25.4 pg — ABNORMAL LOW (ref 26.0–34.0)
MCHC: 31.6 g/dL (ref 30.0–36.0)
MCV: 80.3 fL (ref 78.0–100.0)
PLATELETS: 178 10*3/uL (ref 150–400)
RBC: 5.08 MIL/uL (ref 4.22–5.81)
RDW: 16.1 % — AB (ref 11.5–15.5)
WBC: 19.8 10*3/uL — ABNORMAL HIGH (ref 4.0–10.5)

## 2015-07-20 LAB — URINALYSIS, ROUTINE W REFLEX MICROSCOPIC
BILIRUBIN URINE: NEGATIVE
Glucose, UA: NEGATIVE mg/dL
KETONES UR: 15 mg/dL — AB
NITRITE: POSITIVE — AB
PROTEIN: 100 mg/dL — AB
Specific Gravity, Urine: 1.026 (ref 1.005–1.030)
pH: 5.5 (ref 5.0–8.0)

## 2015-07-20 LAB — LIPASE, BLOOD: Lipase: 23 U/L (ref 11–51)

## 2015-07-20 MED ORDER — FAMOTIDINE IN NACL 20-0.9 MG/50ML-% IV SOLN
20.0000 mg | Freq: Once | INTRAVENOUS | Status: AC
  Administered 2015-07-20: 20 mg via INTRAVENOUS
  Filled 2015-07-20: qty 50

## 2015-07-20 MED ORDER — IOHEXOL 300 MG/ML  SOLN: 25.0000 mL | Freq: Once | INTRAMUSCULAR | Status: DC | PRN

## 2015-07-20 MED ORDER — ACETAMINOPHEN 325 MG PO TABS
650.0000 mg | ORAL_TABLET | Freq: Once | ORAL | Status: AC
  Administered 2015-07-20: 650 mg via ORAL
  Filled 2015-07-20: qty 2

## 2015-07-20 MED ORDER — SODIUM CHLORIDE 0.9 % IV BOLUS (SEPSIS)
1000.0000 mL | Freq: Once | INTRAVENOUS | Status: AC
  Administered 2015-07-20: 1000 mL via INTRAVENOUS

## 2015-07-20 MED ORDER — GI COCKTAIL ~~LOC~~
30.0000 mL | Freq: Once | ORAL | Status: AC
  Administered 2015-07-20: 30 mL via ORAL
  Filled 2015-07-20: qty 30

## 2015-07-20 MED ORDER — DEXTROSE 5 % IV SOLN
1.0000 g | Freq: Once | INTRAVENOUS | Status: AC
  Administered 2015-07-20: 1 g via INTRAVENOUS
  Filled 2015-07-20: qty 10

## 2015-07-20 MED ORDER — ONDANSETRON HCL 4 MG/2ML IJ SOLN
4.0000 mg | Freq: Once | INTRAMUSCULAR | Status: AC
  Administered 2015-07-20: 4 mg via INTRAVENOUS
  Filled 2015-07-20: qty 2

## 2015-07-20 MED ORDER — MORPHINE SULFATE (PF) 4 MG/ML IV SOLN
4.0000 mg | Freq: Once | INTRAVENOUS | Status: AC
  Administered 2015-07-20: 4 mg via INTRAVENOUS
  Filled 2015-07-20: qty 1

## 2015-07-20 NOTE — ED Notes (Signed)
MD to see and assess patient before RN assessment. See MD assessment. 

## 2015-07-20 NOTE — ED Notes (Signed)
MD at bedside. 

## 2015-07-20 NOTE — ED Provider Notes (Signed)
CSN: [id number]     Arrival date & time 07/20/15  1849 History   First MD Initiated Contact with Patient 07/20/15 1920     Chief Complaint  Patient presents with  . Abdominal Pain  . Groin Pain     (Consider location/radiation/quality/duration/timing/severity/associated sxs/prior Treatment) Patient is a 81 y.o. male presenting with groin pain. The history is provided by the patient and medical records.  Groin Pain This is a new problem. The current episode started in the past 7 days. The problem occurs constantly. The problem has been unchanged. Associated symptoms include chills, coughing, fatigue, a fever, myalgias, nausea, urinary symptoms and weakness. Pertinent negatives include no abdominal pain, chest pain, headaches, rash, sore throat, vertigo, visual change or vomiting. The symptoms are aggravated by drinking and walking. He has tried rest for the symptoms. The treatment provided no relief.    Past Medical History  Diagnosis Date  . Hypertension   . GERD (gastroesophageal reflux disease)   . Thyroid disease   . Upper GI bleed   . Ileus ([place])   . Mallory - Weiss tear   . Chronic anemia   . Enlarged prostate     Elevated PSA  . 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 Christoper Fabian benign biopsies February 2005  . History of BPH     s/p TURP 2008  . Blood transfusion without reported diagnosis    Past Surgical History  Procedure Laterality Date  . Thyroidectomy    . Cholecystectomy    . Esophagogastroduodenoscopy  08/31/08    Rourk-possible extrinsic impression on midesophagus, noncritical Schatzki's ring, 3 mm fundal gland polyp, multiple gastric fundic polyps not manipulated, small hiatal hernia : CT chest showed mediastinal mass, eventually underwent thyroidectomy  . Colonoscopy  10/29/03    rourk-inflammatory polyp from 40 cm removed  . Knee arthroscopy      pt denies  . Transurethral resection of  prostate      pt denies, but this is listed in his past medical records.   . Laparotomy  04/26/2012    Procedure: EXPLORATORY LAPAROTOMY;  Surgeon: Donato Heinz, MD;  Location: [facility];  Service: General;  Laterality: N/A;  . Central venous catheter insertion  04/26/2012    Procedure: INSERTION CENTRAL LINE ADULT;  Surgeon: Donato Heinz, MD;  Location: [facility];  Service: General;  Laterality: Left;  started at 0919, ended at 0930,        subclavian  . Esophagogastroduodenoscopy  08/27/2012    Dr. Cristina Gong: moderate sized hiatal hernia, cameron lesions  . Colonoscopy  10/02/2012    Procedure: COLONOSCOPY;  Surgeon: Daneil Dolin, MD;  Location: [facility];  Service: Endoscopy;  Laterality: N/A;  1:00   Family History  Problem Relation Age of Onset  . Cancer Other   . Heart failure Other   . Liver disease Sister   . Breast cancer Sister   . Hypertension Mother   . Stroke Father   . Colon cancer Neg Hx    Social History  Substance Use Topics  . Smoking status: Former Smoker -- 0.50 packs/day    Types: Cigarettes    Quit date: 09/10/1992  . Smokeless tobacco: Never Used  . Alcohol Use: No    Review of Systems  Constitutional: Positive for fever, chills and fatigue.  HENT: Negative for facial swelling and sore throat.   Respiratory: Positive for cough. Negative for shortness of breath.  Cardiovascular: Negative for chest pain.  Gastrointestinal: Positive for nausea. Negative for vomiting and abdominal pain.  Genitourinary: Positive for dysuria, urgency, decreased urine volume, scrotal swelling and testicular pain. Negative for hematuria, discharge, penile swelling and penile pain.  Musculoskeletal: Positive for myalgias. Negative for back pain.  Skin: Negative for rash.  Neurological: Positive for weakness. Negative for vertigo and headaches.  Psychiatric/Behavioral: Negative for confusion.      Allergies  Review of patient's allergies indicates no known  allergies.  Home Medications   Prior to Admission medications   Medication Sig Start Date End Date Taking? Authorizing Provider  calcium carbonate (TUMS - DOSED IN MG ELEMENTAL CALCIUM) 500 MG chewable tablet Chew 2 tablets by mouth daily as needed. Acid Reflux   Yes Historical Provider, MD  ferrous sulfate 325 (65 FE) MG tablet Take 1 tablet (325 mg total) by mouth daily with breakfast. 08/30/12  Yes Reyne Dumas, MD  latanoprost (XALATAN) 0.005 % ophthalmic solution Place 1 drop into both eyes every morning.   Yes Historical Provider, MD  levothyroxine (SYNTHROID, LEVOTHROID) 112 MCG tablet Take 112 mcg by mouth daily before breakfast.   Yes Historical Provider, MD  pantoprazole (PROTONIX) 40 MG tablet Take 1 tablet (40 mg total) by mouth 2 (two) times daily before a meal. Patient taking differently: Take 40 mg by mouth daily.  08/30/12  Yes Reyne Dumas, MD  potassium chloride SA (K-DUR,KLOR-CON) 20 MEQ tablet Take 20 mEq by mouth daily.   Yes Historical Provider, MD  Tamsulosin HCl (FLOMAX) 0.4 MG CAPS Take 0.4 mg by mouth daily.  08/06/12  Yes Historical Provider, MD   BP 149/89 mmHg  Pulse 77  Temp(Src) 101.1 F (38.4 C) (Oral)  Resp 21  SpO2 98% Physical Exam  Constitutional: He is oriented to person, place, and time. He appears well-developed and well-nourished. He appears distressed.  HENT:  Head: Normocephalic and atraumatic.  Right Ear: External ear normal.  Left Ear: External ear normal.  Nose: Nose normal.  Mouth/Throat: Oropharynx is clear and moist. No oropharyngeal exudate.  Eyes: Conjunctivae and EOM are normal. Pupils are equal, round, and reactive to light. Right eye exhibits no discharge. Left eye exhibits no discharge. No scleral icterus.  Neck: Normal range of motion. Neck supple. No JVD present. No tracheal deviation present. No thyromegaly present.  Cardiovascular: Normal rate, regular rhythm and intact distal pulses.   Pulmonary/Chest: Effort normal. No  stridor. No respiratory distress. He has no wheezes. He has no rales. He exhibits no tenderness.  Abdominal: Soft. He exhibits no distension and no mass. There is splenomegaly and hepatomegaly. There is no tenderness. There is no rebound, no guarding and no CVA tenderness. No hernia. Hernia confirmed negative in the right inguinal area and confirmed negative in the left inguinal area.  Genitourinary: Rectal exam shows tenderness. Prostate is enlarged and tender. Left testis shows mass, swelling and tenderness.  Musculoskeletal: Normal range of motion. He exhibits no edema or tenderness.  Lymphadenopathy:    He has no cervical adenopathy.       Right: No inguinal adenopathy present.       Left: No inguinal adenopathy present.  Neurological: He is alert and oriented to person, place, and time.  Skin: Skin is warm and dry. No rash noted. He is not diaphoretic. No erythema. No pallor.  Psychiatric: He has a normal mood and affect. His behavior is normal. Judgment and thought content normal.  Nursing note and vitals reviewed.   ED Course  Procedures (  including critical care time) Labs Review Labs Reviewed  COMPREHENSIVE METABOLIC PANEL - Abnormal; Notable for the following:    Glucose, Bld 146 (*)    BUN 24 (*)    Creatinine, Ser 1.79 (*)    Calcium 8.4 (*)    Albumin 3.3 (*)    AST 49 (*)    GFR calc non Af Amer 34 (*)    GFR calc Af Amer 40 (*)    All other components within normal limits  CBC - Abnormal; Notable for the following:    WBC 19.8 (*)    Hemoglobin 12.9 (*)    MCH 25.4 (*)    RDW 16.1 (*)    All other components within normal limits  URINALYSIS, ROUTINE W REFLEX MICROSCOPIC (NOT AT [facility]) - Abnormal; Notable for the following:    APPearance CLOUDY (*)    Hgb urine dipstick LARGE (*)    Ketones, ur 15 (*)    Protein, ur 100 (*)    Nitrite POSITIVE (*)    Leukocytes, UA MODERATE (*)    All other components within normal limits  URINE MICROSCOPIC-ADD ON - Abnormal;  Notable for the following:    Bacteria, UA FEW (*)    Casts GRANULAR CAST (*)    All other components within normal limits  BASIC METABOLIC PANEL - Abnormal; Notable for the following:    Glucose, Bld 105 (*)    Creatinine, Ser 1.42 (*)    Calcium 7.7 (*)    GFR calc non Af Amer 45 (*)    GFR calc Af Amer 53 (*)    All other components within normal limits  CBC - Abnormal; Notable for the following:    WBC 14.1 (*)    Hemoglobin 11.7 (*)    HCT 36.6 (*)    MCH 25.8 (*)    RDW 16.3 (*)    All other components within normal limits  CULTURE, BLOOD (ROUTINE X 2)  CULTURE, BLOOD (ROUTINE X 2)  URINE CULTURE  LIPASE, BLOOD  CBC  BASIC METABOLIC PANEL  VITAMIN [id number]  FOLATE  IRON AND TIBC  FERRITIN  RETICULOCYTES  I-STAT CG4 LACTIC ACID, ED  I-STAT CG4 LACTIC ACID, ED    Imaging Review Ct Abdomen Pelvis Wo Contrast  07/20/2015  CLINICAL DATA:  Abdominal distention, scrotal swelling and acute kidney injury. EXAM: CT ABDOMEN AND PELVIS WITHOUT CONTRAST TECHNIQUE: Multidetector CT imaging of the abdomen and pelvis was performed following the standard protocol without IV contrast. COMPARISON:  CT 04/23/2012 FINDINGS: Lower chest: The included lung bases are clear. Enteric contrast in the distal esophagus. Heart is prominent in size. Liver: No focal lesion allowing for lack contrast. Hepatobiliary: Postcholecystectomy with clips in the gallbladder fossa. No biliary dilatation. Pancreas: Normal. No ductal dilatation or inflammation. Small 63mm hypodensity in the mid body is unchanged from prior exam and of doubtful clinical significance. Spleen: Normal.  Small splenule at the hilum. Adrenal glands: No nodule. Kidneys: No hydronephrosis or urolithiasis. Prominent column of Bertin on the right. Stomach/Bowel: Stomach physiologically distended. Small hiatal hernia with enteric contrast in the distal esophagus. There are no dilated or thickened small bowel loops. Small volume of stool throughout the  colon without colonic wall thickening. There is herniation of the anti mesenteric border of the transverse colon with a small supraumbilical ventral abdominal wall hernia without obstruction or wall thickening. The appendix is tentatively identified and normal. Vascular/Lymphatic: Borderline 9 mm lymph node at the right iliac bifurcation, previously 6 mm. No  additional retroperitoneal adenopathy. Bilateral inguinal lymph nodes retain normal morphology. Abdominal aorta is normal in caliber. Mild atherosclerosis. Reproductive: Marked enlargement of the prostate gland measuring 7.3 x 6.6 x 8.1 cm. Borders are irregular, there is mass effect on the bladder base. Bladder: Minimally distended with mild wall thickening, likely related to enlarged prostate gland. Other: No free air, free fluid, or intra-abdominal fluid collection. Fat within both inguinal canals. Imaging obtained through the scrotum demonstrates a small calcification on the left. Suggestion of scrotal thickening. No abnormal air. Limited assessment for abscess given lack of contrast. Musculoskeletal: There are no acute or suspicious osseous abnormalities. Multilevel degenerative change throughout spine. IMPRESSION: 1. Small Richter type hernia in the upper ventral abdominal wall containing the anti mesenteric border of transverse colon. No associated colonic wall thickening or obstruction. There is no small bowel obstruction. 2. Enteric contrast in the distal esophagus with small hiatal hernia, may reflect reflux or slow transit. 3. Irregular enlarged prostate gland causing mass effect on the bladder base, slight increase in size compared to prior. Recommend correlation with PSA. 4. Questionable scrotal skin thickening. No evidence of large scrotal abscess allowing for limitations of modality and lack of contrast. Electronically Signed   By: Jeb Levering M.D.   On: 07/20/2015 22:14   Dg Chest 2 View  07/20/2015  CLINICAL DATA:  Acute onset of fever.   Initial encounter. EXAM: CHEST  2 VIEW COMPARISON:  Chest radiograph performed 08/30/2012 FINDINGS: The lungs are mildly hypoexpanded. Mild bilateral atelectasis is noted. There is no evidence of pleural effusion or pneumothorax. The heart is borderline normal in size. No acute osseous abnormalities are seen. Scattered clips are noted about the thyroid bed. IMPRESSION: Lungs mildly hypoexpanded, with mild bilateral atelectasis. Electronically Signed   By: Garald Balding M.D.   On: 07/20/2015 20:44   US Scrotum  07/21/2015  CLINICAL DATA:  Acute onset of left scrotal pain and enlargement. Initial encounter. EXAM: SCROTAL ULTRASOUND DOPPLER ULTRASOUND OF THE TESTICLES TECHNIQUE: Complete ultrasound examination of the testicles, epididymis, and other scrotal structures was performed. Color and spectral Doppler ultrasound were also utilized to evaluate blood flow to the testicles. COMPARISON:  CT of the abdomen and pelvis performed earlier today at 9:40 p.m. FINDINGS: Right testicle Measurements: 3.8 x 2.2 x 2.7 cm. No mass or microlithiasis visualized. Left testicle Measurements: 3.4 x 2.5 x 2.9 cm. No mass or microlithiasis visualized. Right epididymis:  Normal in size and appearance. Left epididymis: The left epididymis is enlarged, measuring 4.4 x 1.1 x 3.2 cm, with associated hyperemia, compatible with epididymitis. Two cysts are seen at the left epididymal head, measuring 1.0 cm and 0.9 cm. The 0.9 cm cyst is mildly complex, with mild internal echoes. Hydrocele:  A small left hydrocele remains within normal limits. Varicocele:  Bilateral patent varicoceles are noted. Pulsed Doppler interrogation of both testes demonstrates normal low resistance arterial and venous waveforms bilaterally. Mild diffuse skin thickening is noted. IMPRESSION: 1. Acute epididymitis noted on the left, with diffusely enlarged left epididymis and associated hyperemia. 2. Two left epididymal head cysts noted, measuring 1.0 cm and 0.9  cm. The 0.9 cm cyst is mildly complex, with mild internal echoes. 3. Bilateral patent varicoceles seen. 4. No evidence for testicular torsion. 5. Mild diffuse skin thickening noted. Electronically Signed   By: Garald Balding M.D.   On: 07/21/2015 00:30   [place] Art/ven Flow Abd Pelv Doppler  07/21/2015  CLINICAL DATA:  Acute onset of left scrotal pain and enlargement. Initial  encounter. EXAM: SCROTAL ULTRASOUND DOPPLER ULTRASOUND OF THE TESTICLES TECHNIQUE: Complete ultrasound examination of the testicles, epididymis, and other scrotal structures was performed. Color and spectral Doppler ultrasound were also utilized to evaluate blood flow to the testicles. COMPARISON:  CT of the abdomen and pelvis performed earlier today at 9:40 p.m. FINDINGS: Right testicle Measurements: 3.8 x 2.2 x 2.7 cm. No mass or microlithiasis visualized. Left testicle Measurements: 3.4 x 2.5 x 2.9 cm. No mass or microlithiasis visualized. Right epididymis:  Normal in size and appearance. Left epididymis: The left epididymis is enlarged, measuring 4.4 x 1.1 x 3.2 cm, with associated hyperemia, compatible with epididymitis. Two cysts are seen at the left epididymal head, measuring 1.0 cm and 0.9 cm. The 0.9 cm cyst is mildly complex, with mild internal echoes. Hydrocele:  A small left hydrocele remains within normal limits. Varicocele:  Bilateral patent varicoceles are noted. Pulsed Doppler interrogation of both testes demonstrates normal low resistance arterial and venous waveforms bilaterally. Mild diffuse skin thickening is noted. IMPRESSION: 1. Acute epididymitis noted on the left, with diffusely enlarged left epididymis and associated hyperemia. 2. Two left epididymal head cysts noted, measuring 1.0 cm and 0.9 cm. The 0.9 cm cyst is mildly complex, with mild internal echoes. 3. Bilateral patent varicoceles seen. 4. No evidence for testicular torsion. 5. Mild diffuse skin thickening noted. Electronically Signed   By: Garald Balding M.D.    On: 07/21/2015 00:30   I have personally reviewed and evaluated these images and lab results as part of my medical decision-making.   EKG Interpretation   Date/Time:  Tuesday July 20 2015 20:17:07 EST Ventricular Rate:  68 PR Interval:  194 QRS Duration: 87 QT Interval:  384 QTC Calculation: 408 R Axis:   1 Text Interpretation:  Sinus rhythm Probable left atrial enlargement No  significant change since last tracing Confirmed by FLOYD MD, Quillian Quince  [phone]) on 07/20/2015 9:15:28 PM      MDM   Final diagnoses:  Fever in adult  Testicular swelling, left  Acute UTI    Victor Hunter is a 81 y.o. male patient having L testicular pain and swelling x 3 days.  Pt having some associated weakness and mild chills.  Labs obtained.  Pt given symptomatic management.     CT ob the abdomen does not show an acute surgical process.  No sings of abscess on imaging studies.  Pt given rocephin for UTI.  Scrotum US show a large contiguous structure, c/w enlarged L testis.  Pt seen radiology.  No other acute concerns at this this time.  . Patient care was discussed with my attending, Dr. Gilford Rile.   Hoyle Sauer, MD 07/22/15 [place], DO 07/22/15 0930

## 2015-07-20 NOTE — H&P (Signed)
Date: 07/20/2015               Patient Name:  Victor Hunter MRN: [id number]  DOB: May 14, 1934 Age / Sex: 81 y.o., male   PCP: Rosita Fire, MD         Medical Service: Internal Medicine Teaching Service         Attending Physician: Dr. Annia Belt, MD    First Contact: Dr. Berline Lopes, MD Pager: [phone]  Second Contact: Dr. Dellia Nims, MD Pager: [phone]       After Hours (After 5p/  First Contact Pager: [phone]  weekends / holidays): Second Contact Pager: [phone]   Chief Complaint: Dysuria  History of Present Illness:   [name] is a 81 year old gentleman with a past medical history of BPH (s/p TURP 2008), chronic hiccups with GERD, HTN, hypothyroidism who presents with dysuria. This problem started on 11/13 when he noticed increased urinary urgency, reduced urine volume when going to the bathroom, a sensation of not emptying his bladder, blood in his urine, and pain with urination. He also endorses new onset tenderness in his left testicle which came on gradually and not suddenly. Mr. Dunston has also had chills and night sweats, but he did not take his temperature at home. Otherwise, he describes longstanding, intermittent hiccups and some mild constipation. He denies any light-headedness, headache, chest pain, shortness of breath, new weakness, new numbness or tingling in his feet, back pain, unintended weight loss, nausea, vomiting, diarrhea, or recent surgeries. Notably, he was in in the ED on 10/14 for hematuria. At that time, his UA was negative for nitrites and leukocytes, only remarkable for hemoglobinuria. No urine cultures were obtained. He was discharged from the ED on a 7 day course of cephalexin 500 mg BID. He reported improving on those antibiotics before these symptoms re-emerged. He is followed by a urologist in [place] for his BPH and has an appointment scheduled next week. He has had elevated PSAs in the past. He currently works as a Presenter, broadcasting,  formerly as a Administrator. He has never worked in a [place] with dyes. He is a former smoker and quit in 1994, previously smoking 1/2 ppd. He is a non-drinker. He lives at home by himself but receives great support from his four daughters, who were present.  In the ED, he had a maximum temperature of 101.9 with a leukocytosis of 19.8. His other vitals were stable. A UA demonstrated positive nitrites, moderate leukocytes, hemoglobinuria, ketonuria, and proteinuria. CT abdomen pelvis identified an irregular, enlarged prostate gland causing mass effect on the bladder base, scrotal skin thickening, and upper GI findings consistent GERD. A scrotal ultrasound with dopplers demonstrated left epididymitis with hypemia without evidence of torsion. CXR just showed mild bilateral atelectasis  Meds: Current Facility-Administered Medications  Medication Dose Route Frequency Provider Last Rate Last Dose  . iohexol (OMNIPAQUE) 300 MG/ML solution 25 mL  25 mL Oral Once PRN Hoyle Sauer, MD       Current Outpatient Prescriptions  Medication Sig Dispense Refill  . calcium carbonate (TUMS - DOSED IN MG ELEMENTAL CALCIUM) 500 MG chewable tablet Chew 2 tablets by mouth daily as needed. Acid Reflux    . ferrous sulfate 325 (65 FE) MG tablet Take 1 tablet (325 mg total) by mouth daily with breakfast. 30 tablet 6  . latanoprost (XALATAN) 0.005 % ophthalmic solution Place 1 drop into both eyes every morning.    Marland Kitchen levothyroxine (SYNTHROID, LEVOTHROID) 112 MCG tablet  Take 112 mcg by mouth daily before breakfast.    . pantoprazole (PROTONIX) 40 MG tablet Take 1 tablet (40 mg total) by mouth 2 (two) times daily before a meal. (Patient taking differently: Take 40 mg by mouth daily. ) 60 tablet 2  . potassium chloride SA (K-DUR,KLOR-CON) 20 MEQ tablet Take 20 mEq by mouth daily.    . Tamsulosin HCl (FLOMAX) 0.4 MG CAPS Take 0.4 mg by mouth daily.       Allergies: Allergies as of 07/20/2015  . (No Known Allergies)   Past  Medical History  Diagnosis Date  . Hypertension   . GERD (gastroesophageal reflux disease)   . Thyroid disease   . Upper GI bleed   . Ileus ([place])   . Mallory - Weiss tear   . Chronic anemia   . Enlarged prostate     Elevated PSA  . 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 Christoper Fabian benign biopsies February 2005  . History of BPH     s/p TURP 2008  . Blood transfusion without reported diagnosis    Past Surgical History  Procedure Laterality Date  . Thyroidectomy    . Cholecystectomy    . Esophagogastroduodenoscopy  08/31/08    Rourk-possible extrinsic impression on midesophagus, noncritical Schatzki's ring, 3 mm fundal gland polyp, multiple gastric fundic polyps not manipulated, small hiatal hernia : CT chest showed mediastinal mass, eventually underwent thyroidectomy  . Colonoscopy  10/29/03    rourk-inflammatory polyp from 40 cm removed  . Knee arthroscopy      pt denies  . Transurethral resection of prostate      pt denies, but this is listed in his past medical records.   . Laparotomy  04/26/2012    Procedure: EXPLORATORY LAPAROTOMY;  Surgeon: Donato Heinz, MD;  Location: [facility];  Service: General;  Laterality: N/A;  . Central venous catheter insertion  04/26/2012    Procedure: INSERTION CENTRAL LINE ADULT;  Surgeon: Donato Heinz, MD;  Location: [facility];  Service: General;  Laterality: Left;  started at 0919, ended at 0930,        subclavian  . Esophagogastroduodenoscopy  08/27/2012    Dr. Cristina Gong: moderate sized hiatal hernia, cameron lesions  . Colonoscopy  10/02/2012    Procedure: COLONOSCOPY;  Surgeon: Daneil Dolin, MD;  Location: [facility];  Service: Endoscopy;  Laterality: N/A;  1:00   Family History  Problem Relation Age of Onset  . Cancer Other   . Heart failure Other   . Liver disease Sister   . Breast cancer Sister   . Hypertension Mother   . Stroke Father   . Colon cancer Neg Hx      Social History   Social History  . Marital Status: Married    Spouse Name: N/A  . Number of Children: 4  . Years of Education: N/A   Occupational History  . retired Administrator    Social History Main Topics  . Smoking status: Former Smoker -- 0.50 packs/day    Types: Cigarettes    Quit date: 09/10/1992  . Smokeless tobacco: Never Used  . Alcohol Use: No  . Drug Use: No  . Sexual Activity: No   Other Topics Concern  . Not on file   Social History Narrative    Review of Systems: Negative except per HPI  Physical Exam: Blood pressure 152/69, pulse 65, temperature 100.2 F (37.9 C), temperature  source Oral, resp. rate 22, SpO2 97 %. General: Lying in bed, no acute distress HEENT: Moist mucous membranes, no tonsillar erythema or exudates Cardiovascular: RRR, no m/r/g. No extra heart sounds. Pulmonary: Clear to auscultation bilaterally Back: No bony tenderness or CVA tenderness Abdomen: Mildly distended and somewhat firm. Normal bowel sounds. Mild tenderness to palpation in the suprapubic region. Genitourinary: Mild swelling of left testicle compared to right. Tenderness of left testicle only without retraction. Prostate was smooth, non-boggy, and non-tender. Extremities: No clubbing, cyanosis, or edema Neurological: AAOx3. Tongue midline, face symmetric. EOMI, PERRL. 5/5 strength in all extremities. Light touch sensation symmetric and in-tact.   Lab results: Basic Metabolic Panel:  Recent Labs  07/20/15 1919  NA 136  K 4.4  CL 106  CO2 22  GLUCOSE 146*  BUN 24*  CREATININE 1.79*  CALCIUM 8.4*   Liver Function Tests:  Recent Labs  07/20/15 1919  AST 49*  ALT 39  ALKPHOS 125  BILITOT 0.4  PROT 7.2  ALBUMIN 3.3*    Recent Labs  07/20/15 1919  LIPASE 23   CBC:  Recent Labs  07/20/15 1919  WBC 19.8*  HGB 12.9*  HCT 40.8  MCV 80.3  PLT 178   Urinalysis:  Recent Labs  07/20/15 1957  COLORURINE YELLOW  LABSPEC 1.026  PHURINE 5.5   GLUCOSEU NEGATIVE  HGBUR LARGE*  BILIRUBINUR NEGATIVE  KETONESUR 15*  PROTEINUR 100*  NITRITE POSITIVE*  LEUKOCYTESUR MODERATE*    Imaging results:  Ct Abdomen Pelvis Wo Contrast  07/20/2015  CLINICAL DATA:  Abdominal distention, scrotal swelling and acute kidney injury. EXAM: CT ABDOMEN AND PELVIS WITHOUT CONTRAST TECHNIQUE: Multidetector CT imaging of the abdomen and pelvis was performed following the standard protocol without IV contrast. COMPARISON:  CT 04/23/2012 FINDINGS: Lower chest: The included lung bases are clear. Enteric contrast in the distal esophagus. Heart is prominent in size. Liver: No focal lesion allowing for lack contrast. Hepatobiliary: Postcholecystectomy with clips in the gallbladder fossa. No biliary dilatation. Pancreas: Normal. No ductal dilatation or inflammation. Small 16mm hypodensity in the mid body is unchanged from prior exam and of doubtful clinical significance. Spleen: Normal.  Small splenule at the hilum. Adrenal glands: No nodule. Kidneys: No hydronephrosis or urolithiasis. Prominent column of Bertin on the right. Stomach/Bowel: Stomach physiologically distended. Small hiatal hernia with enteric contrast in the distal esophagus. There are no dilated or thickened small bowel loops. Small volume of stool throughout the colon without colonic wall thickening. There is herniation of the anti mesenteric border of the transverse colon with a small supraumbilical ventral abdominal wall hernia without obstruction or wall thickening. The appendix is tentatively identified and normal. Vascular/Lymphatic: Borderline 9 mm lymph node at the right iliac bifurcation, previously 6 mm. No additional retroperitoneal adenopathy. Bilateral inguinal lymph nodes retain normal morphology. Abdominal aorta is normal in caliber. Mild atherosclerosis. Reproductive: Marked enlargement of the prostate gland measuring 7.3 x 6.6 x 8.1 cm. Borders are irregular, there is mass effect on the bladder  base. Bladder: Minimally distended with mild wall thickening, likely related to enlarged prostate gland. Other: No free air, free fluid, or intra-abdominal fluid collection. Fat within both inguinal canals. Imaging obtained through the scrotum demonstrates a small calcification on the left. Suggestion of scrotal thickening. No abnormal air. Limited assessment for abscess given lack of contrast. Musculoskeletal: There are no acute or suspicious osseous abnormalities. Multilevel degenerative change throughout spine. IMPRESSION: 1. Small Richter type hernia in the upper ventral abdominal wall containing the anti mesenteric  border of transverse colon. No associated colonic wall thickening or obstruction. There is no small bowel obstruction. 2. Enteric contrast in the distal esophagus with small hiatal hernia, may reflect reflux or slow transit. 3. Irregular enlarged prostate gland causing mass effect on the bladder base, slight increase in size compared to prior. Recommend correlation with PSA. 4. Questionable scrotal skin thickening. No evidence of large scrotal abscess allowing for limitations of modality and lack of contrast. Electronically Signed   By: Jeb Levering M.D.   On: 07/20/2015 22:14   Dg Chest 2 View  07/20/2015  CLINICAL DATA:  Acute onset of fever.  Initial encounter. EXAM: CHEST  2 VIEW COMPARISON:  Chest radiograph performed 08/30/2012 FINDINGS: The lungs are mildly hypoexpanded. Mild bilateral atelectasis is noted. There is no evidence of pleural effusion or pneumothorax. The heart is borderline normal in size. No acute osseous abnormalities are seen. Scattered clips are noted about the thyroid bed. IMPRESSION: Lungs mildly hypoexpanded, with mild bilateral atelectasis. Electronically Signed   By: Garald Balding M.D.   On: 07/20/2015 20:44   EKG: Normal sinus rhythm  Assessment & Plan by Problem:  Complicated Urinary Tract Infection with Epididymitis: UTIs in men are generally considered  complicated; therefore, cephalexin would not be a first-line antibiotic for this purpose. Based on physical exam and CT Abdomen, I do not suspect pyelonephritis at this time. Physical exam also does not suggest prostatitis. Translocation of bacteria likely also led to epididymitis and will likely respond to the same antibiotics. - Ceftriaxone 1 gram IV daily, consider transitioning to an oral fluoroquinolone to complete a 10-day course.  - Blood cultures, urine cultures pending. - Tylenol 650 mg prn pain q6h - CBC in AM  Hematuria: Could be related to UTI, although his UA in October was only remarkable for hematuria. He denies any alarm symptoms for malignancy, but he is a former smoker. Will require follow-up with urologist, perhaps with cystoscopy.  Enlarged Prostate: Longstanding problem. CT suggests slight increase in size since 2013. Patient already has close follow-up in 1 week with his urologist. No sign of malignancy based on history, physical exam, or CT abdomen. - Tamulosin 0.4 mg daily - Monitor urine output  AKI: Baseline creatinine of 1.1, up to 1.79. Could be obstructive versus prerenal, although did not appear dry on exam.  - BMET in AM  GERD: Known hiatal hernia with previous upper GI bleed in December 2013 needing transfusion. Continue home pantoprazole 40 mg daily  Constipation: Start Senakot-S at bedtime  Hypothyroidism: No TSH on file. Continue Synthroid 112 mcg daily.  Chronic Hypokalemia: On his problem list from three years ago. Not currently on any diuretics. Unclear why he takes this medication. - Continue 20 mEq KDur daily - BMET in AM  Glaucoma: Continue lanaprost eye drops  DVT Prophylaxis: Heparin [place] Diet: Heart Healthy Admit to: Med-Surg Code Status: Full  Dispo: Disposition is deferred at this time, awaiting improvement of current medical problems. Anticipated discharge in approximately 3-4 day(s).   The patient does have a current PCP (Rosita Fire,  MD) and does not need an [facility] hospital follow-up appointment after discharge.  The patient does not have transportation limitations that hinder transportation to clinic appointments.  Signed: Liberty Handy, MD 07/20/2015, 11:45 PM

## 2015-07-20 NOTE — ED Notes (Signed)
Pt sts lower abd pain and groin pain with pain in testicles x 3 days; per family pt had fall on Monday and was unable to get up

## 2015-07-21 ENCOUNTER — Encounter (HOSPITAL_COMMUNITY): Payer: Self-pay

## 2015-07-21 DIAGNOSIS — N400 Enlarged prostate without lower urinary tract symptoms: Secondary | ICD-10-CM

## 2015-07-21 DIAGNOSIS — Z8639 Personal history of other endocrine, nutritional and metabolic disease: Secondary | ICD-10-CM

## 2015-07-21 DIAGNOSIS — N451 Epididymitis: Principal | ICD-10-CM

## 2015-07-21 DIAGNOSIS — N50812 Left testicular pain: Secondary | ICD-10-CM | POA: Insufficient documentation

## 2015-07-21 DIAGNOSIS — N390 Urinary tract infection, site not specified: Secondary | ICD-10-CM

## 2015-07-21 DIAGNOSIS — K317 Polyp of stomach and duodenum: Secondary | ICD-10-CM | POA: Insufficient documentation

## 2015-07-21 DIAGNOSIS — R972 Elevated prostate specific antigen [PSA]: Secondary | ICD-10-CM

## 2015-07-21 DIAGNOSIS — R509 Fever, unspecified: Secondary | ICD-10-CM

## 2015-07-21 DIAGNOSIS — E890 Postprocedural hypothyroidism: Secondary | ICD-10-CM

## 2015-07-21 LAB — BASIC METABOLIC PANEL
ANION GAP: 7 (ref 5–15)
BUN: 20 mg/dL (ref 6–20)
CALCIUM: 7.7 mg/dL — AB (ref 8.9–10.3)
CO2: 24 mmol/L (ref 22–32)
CREATININE: 1.42 mg/dL — AB (ref 0.61–1.24)
Chloride: 108 mmol/L (ref 101–111)
GFR, EST AFRICAN AMERICAN: 53 mL/min — AB (ref >60)
GFR, EST NON AFRICAN AMERICAN: 45 mL/min — AB (ref >60)
Glucose, Bld: 105 mg/dL — ABNORMAL HIGH (ref 65–99)
Potassium: 4.2 mmol/L (ref 3.5–5.1)
SODIUM: 139 mmol/L (ref 135–145)

## 2015-07-21 LAB — CBC
HCT: 36.6 % — ABNORMAL LOW (ref 39.0–52.0)
HEMOGLOBIN: 11.7 g/dL — AB (ref 13.0–17.0)
MCH: 25.8 pg — ABNORMAL LOW (ref 26.0–34.0)
MCHC: 32 g/dL (ref 30.0–36.0)
MCV: 80.6 fL (ref 78.0–100.0)
PLATELETS: 181 10*3/uL (ref 150–400)
RBC: 4.54 MIL/uL (ref 4.22–5.81)
RDW: 16.3 % — ABNORMAL HIGH (ref 11.5–15.5)
WBC: 14.1 10*3/uL — AB (ref 4.0–10.5)

## 2015-07-21 MED ORDER — FERROUS SULFATE 325 (65 FE) MG PO TABS
325.0000 mg | ORAL_TABLET | Freq: Every day | ORAL | Status: DC
  Administered 2015-07-22: 325 mg via ORAL
  Filled 2015-07-21: qty 1

## 2015-07-21 MED ORDER — POTASSIUM CHLORIDE CRYS ER 20 MEQ PO TBCR
20.0000 meq | EXTENDED_RELEASE_TABLET | Freq: Every day | ORAL | Status: DC
  Administered 2015-07-21 – 2015-07-23 (×3): 20 meq via ORAL
  Filled 2015-07-21 (×3): qty 1

## 2015-07-21 MED ORDER — PANTOPRAZOLE SODIUM 40 MG PO TBEC
40.0000 mg | DELAYED_RELEASE_TABLET | Freq: Every day | ORAL | Status: DC
  Administered 2015-07-21 – 2015-07-23 (×3): 40 mg via ORAL
  Filled 2015-07-21 (×3): qty 1

## 2015-07-21 MED ORDER — HEPARIN SODIUM (PORCINE) 5000 UNIT/ML IJ SOLN
5000.0000 [IU] | Freq: Three times a day (TID) | INTRAMUSCULAR | Status: DC
  Administered 2015-07-21 – 2015-07-23 (×7): 5000 [IU] via SUBCUTANEOUS
  Filled 2015-07-21 (×7): qty 1

## 2015-07-21 MED ORDER — LEVOTHYROXINE SODIUM 112 MCG PO TABS
112.0000 ug | ORAL_TABLET | Freq: Every day | ORAL | Status: DC
  Administered 2015-07-21 – 2015-07-23 (×3): 112 ug via ORAL
  Filled 2015-07-21 (×3): qty 1

## 2015-07-21 MED ORDER — GI COCKTAIL ~~LOC~~
30.0000 mL | Freq: Two times a day (BID) | ORAL | Status: DC | PRN
  Administered 2015-07-21 – 2015-07-22 (×3): 30 mL via ORAL
  Filled 2015-07-21 (×3): qty 30

## 2015-07-21 MED ORDER — ACETAMINOPHEN 325 MG PO TABS
650.0000 mg | ORAL_TABLET | Freq: Four times a day (QID) | ORAL | Status: DC | PRN
  Administered 2015-07-21: 650 mg via ORAL
  Filled 2015-07-21: qty 2

## 2015-07-21 MED ORDER — SENNOSIDES-DOCUSATE SODIUM 8.6-50 MG PO TABS
1.0000 | ORAL_TABLET | Freq: Every evening | ORAL | Status: DC | PRN
  Administered 2015-07-22: 1 via ORAL
  Filled 2015-07-21: qty 1

## 2015-07-21 MED ORDER — TAMSULOSIN HCL 0.4 MG PO CAPS
0.4000 mg | ORAL_CAPSULE | Freq: Every day | ORAL | Status: DC
  Administered 2015-07-21 – 2015-07-23 (×3): 0.4 mg via ORAL
  Filled 2015-07-21 (×3): qty 1

## 2015-07-21 MED ORDER — ACETAMINOPHEN 650 MG RE SUPP: 650.0000 mg | Freq: Four times a day (QID) | RECTAL | Status: DC | PRN

## 2015-07-21 MED ORDER — DEXTROSE 5 % IV SOLN
1.0000 g | Freq: Every day | INTRAVENOUS | Status: DC
  Administered 2015-07-21 – 2015-07-22 (×2): 1 g via INTRAVENOUS
  Filled 2015-07-21 (×3): qty 10

## 2015-07-21 MED ORDER — LATANOPROST 0.005 % OP SOLN
1.0000 [drp] | Freq: Every day | OPHTHALMIC | Status: DC
  Administered 2015-07-21 – 2015-07-23 (×3): 1 [drp] via OPHTHALMIC
  Filled 2015-07-21 (×2): qty 2.5

## 2015-07-21 NOTE — Care Management Note (Addendum)
Case Management Note  Patient Details  Name: Victor Hunter MRN: [id number] Date of Birth: 26-Mar-1934  Subjective/Objective:                  Date-07-21-15 Initial Assessment Spoke with patient at the bedside. 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  Verified patient lives at [address] [place] [address] Verified patient anticipates to go home alone, has family that lives across the street and someone can be with him part of the time.  Patient has no DME. Expressed potential need for no other DME.  Patient denied  needing help with their medication.  Patient drives to MD appointments.  Verified patient has PCP Victor Hunter Patient states they currently receive [facility] services through no one.    Plan: CM will continue to follow for discharge planning and [facility] resources.   Carles Collet RN BSN CM [phone]   Action/Plan:  07-23-15 PT walked patient in hallway, see note, did not qualify for [facility] PT. Spoke with patient, he declined [facility]. No other CM needs identified. Paient to DC home today Expected Discharge Date:                  Expected Discharge Plan:  Home/Self Care  In-House Referral:     Discharge planning Services  CM Consult  Post Acute Care Choice:    Choice offered to:     DME Arranged:    DME Agency:     HH Arranged:    HH Agency:     Status of Service:  In process, will continue to follow  Medicare Important Message Given:    Date Medicare IM Given:    Medicare IM give by:    Date Additional Medicare IM Given:    Additional Medicare Important Message give by:     If discussed at [place] of Stay Meetings, dates discussed:    Additional Comments:  Carles Collet, RN 07/21/2015, 11:09 AM

## 2015-07-21 NOTE — Progress Notes (Signed)
Subjective: Patient was seen and examined at bedside today. He is currently comfortable and not complaining of any pain. Denies having any other complaints. States he has an appointment with his urologist on Monday.  Objective: Vital signs in last 24 hours: Filed Vitals:   07/20/15 2330 07/21/15 0039 07/21/15 0531 07/21/15 1341  BP: 145/69 128/68 130/72 155/75  Pulse: 64 64 64 80  Temp:  98.2 F (36.8 C) 99.1 F (37.3 C) 102.1 F (38.9 C)  TempSrc:  Oral Oral Oral  Resp: 17 18 18 22   Height:  5' (1.524 m)    Weight:  179 lb 0.2 oz (81.2 kg)    SpO2: 97% 99% 99% 99%   Weight change:   Intake/Output Summary (Last 24 hours) at 07/21/15 1402 Last data filed at 07/21/15 1300  Gross per 24 hour  Intake   2420 ml  Output      0 ml  Net   2420 ml   Physical Exam: General: Lying in bed, no acute distress Cardiovascular: RRR, no m/r/g. Pulses intact.  Pulmonary: Clear to auscultation bilaterally. No wheezes, rales, or rhonchi appreciated.  Abdomen: Midline reducible hernia. +BS. Abdomen is dull to percussion below the level of the umbilicus. Mildly distended and somewhat firm. No tenderness on palpation. No rebound, guarding, or rigidity.  Genitourinary: Mild swelling of left testicle compared to right. L testicle mildly tender to palpation.  Extremities: No edema.  Lab Results: Basic Metabolic Panel:  Recent Labs Lab 07/20/15 1919 07/21/15 0552  NA 136 139  K 4.4 4.2  CL 106 108  CO2 22 24  GLUCOSE 146* 105*  BUN 24* 20  CREATININE 1.79* 1.42*  CALCIUM 8.4* 7.7*   Liver Function Tests:  Recent Labs Lab 07/20/15 1919  AST 49*  ALT 39  ALKPHOS 125  BILITOT 0.4  PROT 7.2  ALBUMIN 3.3*    Recent Labs Lab 07/20/15 1919  LIPASE 23   CBC:  Recent Labs Lab 07/20/15 1919 07/21/15 0552  WBC 19.8* 14.1*  HGB 12.9* 11.7*  HCT 40.8 36.6*  MCV 80.3 80.6  PLT 178 181   Urinalysis:  Recent Labs Lab 07/20/15 1957  COLORURINE YELLOW  LABSPEC 1.026    PHURINE 5.5  GLUCOSEU NEGATIVE  HGBUR LARGE*  BILIRUBINUR NEGATIVE  KETONESUR 15*  PROTEINUR 100*  NITRITE POSITIVE*  LEUKOCYTESUR MODERATE*    Micro Results: Recent Results (from the past 240 hour(s))  Blood culture (routine x 2)     Status: None (Preliminary result)   Collection Time: 07/20/15  7:48 PM  Result Value Ref Range Status   Specimen Description RIGHT ANTECUBITAL  Final   Special Requests BOTTLES DRAWN AEROBIC AND ANAEROBIC 5CC  Final   Culture NO GROWTH < 24 HOURS  Final   Report Status PENDING  Incomplete  Urine culture     Status: None (Preliminary result)   Collection Time: 07/20/15  7:57 PM  Result Value Ref Range Status   Specimen Description URINE, CLEAN CATCH  Final   Special Requests Normal  Final   Culture TOO YOUNG TO READ  Final   Report Status PENDING  Incomplete  Blood culture (routine x 2)     Status: None (Preliminary result)   Collection Time: 07/20/15  8:45 PM  Result Value Ref Range Status   Specimen Description BLOOD RIGHT HAND  Final   Special Requests BOTTLES DRAWN AEROBIC AND ANAEROBIC 5CC  Final   Culture NO GROWTH < 24 HOURS  Final   Report Status  PENDING  Incomplete   Studies/Results: Ct Abdomen Pelvis Wo Contrast  07/20/2015  CLINICAL DATA:  Abdominal distention, scrotal swelling and acute kidney injury. EXAM: CT ABDOMEN AND PELVIS WITHOUT CONTRAST TECHNIQUE: Multidetector CT imaging of the abdomen and pelvis was performed following the standard protocol without IV contrast. COMPARISON:  CT 04/23/2012 FINDINGS: Lower chest: The included lung bases are clear. Enteric contrast in the distal esophagus. Heart is prominent in size. Liver: No focal lesion allowing for lack contrast. Hepatobiliary: Postcholecystectomy with clips in the gallbladder fossa. No biliary dilatation. Pancreas: Normal. No ductal dilatation or inflammation. Small 82mm hypodensity in the mid body is unchanged from prior exam and of doubtful clinical significance. Spleen:  Normal.  Small splenule at the hilum. Adrenal glands: No nodule. Kidneys: No hydronephrosis or urolithiasis. Prominent column of Bertin on the right. Stomach/Bowel: Stomach physiologically distended. Small hiatal hernia with enteric contrast in the distal esophagus. There are no dilated or thickened small bowel loops. Small volume of stool throughout the colon without colonic wall thickening. There is herniation of the anti mesenteric border of the transverse colon with a small supraumbilical ventral abdominal wall hernia without obstruction or wall thickening. The appendix is tentatively identified and normal. Vascular/Lymphatic: Borderline 9 mm lymph node at the right iliac bifurcation, previously 6 mm. No additional retroperitoneal adenopathy. Bilateral inguinal lymph nodes retain normal morphology. Abdominal aorta is normal in caliber. Mild atherosclerosis. Reproductive: Marked enlargement of the prostate gland measuring 7.3 x 6.6 x 8.1 cm. Borders are irregular, there is mass effect on the bladder base. Bladder: Minimally distended with mild wall thickening, likely related to enlarged prostate gland. Other: No free air, free fluid, or intra-abdominal fluid collection. Fat within both inguinal canals. Imaging obtained through the scrotum demonstrates a small calcification on the left. Suggestion of scrotal thickening. No abnormal air. Limited assessment for abscess given lack of contrast. Musculoskeletal: There are no acute or suspicious osseous abnormalities. Multilevel degenerative change throughout spine. IMPRESSION: 1. Small Richter type hernia in the upper ventral abdominal wall containing the anti mesenteric border of transverse colon. No associated colonic wall thickening or obstruction. There is no small bowel obstruction. 2. Enteric contrast in the distal esophagus with small hiatal hernia, may reflect reflux or slow transit. 3. Irregular enlarged prostate gland causing mass effect on the bladder base,  slight increase in size compared to prior. Recommend correlation with PSA. 4. Questionable scrotal skin thickening. No evidence of large scrotal abscess allowing for limitations of modality and lack of contrast. Electronically Signed   By: Jeb Levering M.D.   On: 07/20/2015 22:14   Dg Chest 2 View  07/20/2015  CLINICAL DATA:  Acute onset of fever.  Initial encounter. EXAM: CHEST  2 VIEW COMPARISON:  Chest radiograph performed 08/30/2012 FINDINGS: The lungs are mildly hypoexpanded. Mild bilateral atelectasis is noted. There is no evidence of pleural effusion or pneumothorax. The heart is borderline normal in size. No acute osseous abnormalities are seen. Scattered clips are noted about the thyroid bed. IMPRESSION: Lungs mildly hypoexpanded, with mild bilateral atelectasis. Electronically Signed   By: Garald Balding M.D.   On: 07/20/2015 20:44   US Scrotum  07/21/2015  CLINICAL DATA:  Acute onset of left scrotal pain and enlargement. Initial encounter. EXAM: SCROTAL ULTRASOUND DOPPLER ULTRASOUND OF THE TESTICLES TECHNIQUE: Complete ultrasound examination of the testicles, epididymis, and other scrotal structures was performed. Color and spectral Doppler ultrasound were also utilized to evaluate blood flow to the testicles. COMPARISON:  CT of the abdomen and  pelvis performed earlier today at 9:40 p.m. FINDINGS: Right testicle Measurements: 3.8 x 2.2 x 2.7 cm. No mass or microlithiasis visualized. Left testicle Measurements: 3.4 x 2.5 x 2.9 cm. No mass or microlithiasis visualized. Right epididymis:  Normal in size and appearance. Left epididymis: The left epididymis is enlarged, measuring 4.4 x 1.1 x 3.2 cm, with associated hyperemia, compatible with epididymitis. Two cysts are seen at the left epididymal head, measuring 1.0 cm and 0.9 cm. The 0.9 cm cyst is mildly complex, with mild internal echoes. Hydrocele:  A small left hydrocele remains within normal limits. Varicocele:  Bilateral patent varicoceles  are noted. Pulsed Doppler interrogation of both testes demonstrates normal low resistance arterial and venous waveforms bilaterally. Mild diffuse skin thickening is noted. IMPRESSION: 1. Acute epididymitis noted on the left, with diffusely enlarged left epididymis and associated hyperemia. 2. Two left epididymal head cysts noted, measuring 1.0 cm and 0.9 cm. The 0.9 cm cyst is mildly complex, with mild internal echoes. 3. Bilateral patent varicoceles seen. 4. No evidence for testicular torsion. 5. Mild diffuse skin thickening noted. Electronically Signed   By: Garald Balding M.D.   On: 07/21/2015 00:30   [place] Art/ven Flow Abd Pelv Doppler  07/21/2015  CLINICAL DATA:  Acute onset of left scrotal pain and enlargement. Initial encounter. EXAM: SCROTAL ULTRASOUND DOPPLER ULTRASOUND OF THE TESTICLES TECHNIQUE: Complete ultrasound examination of the testicles, epididymis, and other scrotal structures was performed. Color and spectral Doppler ultrasound were also utilized to evaluate blood flow to the testicles. COMPARISON:  CT of the abdomen and pelvis performed earlier today at 9:40 p.m. FINDINGS: Right testicle Measurements: 3.8 x 2.2 x 2.7 cm. No mass or microlithiasis visualized. Left testicle Measurements: 3.4 x 2.5 x 2.9 cm. No mass or microlithiasis visualized. Right epididymis:  Normal in size and appearance. Left epididymis: The left epididymis is enlarged, measuring 4.4 x 1.1 x 3.2 cm, with associated hyperemia, compatible with epididymitis. Two cysts are seen at the left epididymal head, measuring 1.0 cm and 0.9 cm. The 0.9 cm cyst is mildly complex, with mild internal echoes. Hydrocele:  A small left hydrocele remains within normal limits. Varicocele:  Bilateral patent varicoceles are noted. Pulsed Doppler interrogation of both testes demonstrates normal low resistance arterial and venous waveforms bilaterally. Mild diffuse skin thickening is noted. IMPRESSION: 1. Acute epididymitis noted on the left, with  diffusely enlarged left epididymis and associated hyperemia. 2. Two left epididymal head cysts noted, measuring 1.0 cm and 0.9 cm. The 0.9 cm cyst is mildly complex, with mild internal echoes. 3. Bilateral patent varicoceles seen. 4. No evidence for testicular torsion. 5. Mild diffuse skin thickening noted. Electronically Signed   By: Garald Balding M.D.   On: 07/21/2015 00:30   Medications: I have reviewed the patient's current medications. Scheduled Meds: . cefTRIAXone (ROCEPHIN)  IV  1 g Intravenous QHS  . heparin  5,000 Units Subcutaneous 3 times per day  . latanoprost  1 drop Both Eyes Daily  . levothyroxine  112 mcg Oral QAC breakfast  . pantoprazole  40 mg Oral Daily  . potassium chloride SA  20 mEq Oral Daily  . tamsulosin  0.4 mg Oral Daily   Continuous Infusions:  PRN Meds:.acetaminophen **OR** acetaminophen, senna-docusate Assessment/Plan: Active Problems:   Urinary tract infection   Enlarged prostate   Complicated UTI (urinary tract infection)  Complicated Urinary Tract Infection with Epididymitis: patient is currently comfortable and not complaining of any pain. Tmax 102.1 in the past 24 hrs. However, WBC  tending down from 19.8 on admission to 14.1 now. Blood culture showing no growth in <24 hrs.   -Ceftriaxone 1 gram IV daily -F/u final blood culture results -urine cultures pending. -Tylenol 650 mg prn pain q6h -CBC in AM  Hematuria: Could be related to UTI, although his UA in October was only remarkable for hematuria. He denies any alarm symptoms for malignancy, but he is a former smoker.  -Will require follow-up with urologist, possible cystoscopy.  Enlarged Prostate: Longstanding problem. CT suggests slight increase in size since 2013. Patient already has close follow-up in 1 week with his urologist (appt on 11/21). No sign of malignancy based on history, physical exam, or CT abdomen. -Tamulosin 0.4 mg daily -In and out cath -Monitor urine output  AKI: Baseline  creatinine of 1.1, up to 1.79 on admission. SCr 1.42 now. Could be obstructive versus prerenal, although did not appear dry on exam.  - BMET in AM  Normocytic anemia: Hgb chronically low. It was in the 9s in 2013 and 11.7 in 09/2012. Hgb 12.9 on admission. Ferritin low (8) in 2013. Patient did have hematuria on his UA from 06/2015 and this admission.  -Will require follow-up with urologist, possible cystoscopy. -Continue Ferrous sulfate 325 mg qd -F/u anemia panel   GERD: Known hiatal hernia with previous upper GI bleed in December 2013 needing transfusion.  - Continue home pantoprazole 40 mg daily  Constipation - Senakot-S at bedtime  Hypothyroidism -Synthroid 112 mcg daily.  Chronic Hypokalemia: On his problem list from three years ago. Not currently on any diuretics. Unclear why he takes this medication. - Continue 20 mEq KDur daily - BMET in AM  Glaucoma -Continue lanaprost eye drops  DVT Prophylaxis: Heparin [place] Diet: Heart Healthy Admit to: Med-Surg Code Status: Full  Dispo: Disposition is deferred at this time, awaiting improvement of current medical problems.  Anticipated discharge in approximately 1-2 day(s).   The patient does have a current PCP (Rosita Fire, MD) and does need an [facility] hospital follow-up appointment after discharge.  The patient does not have transportation limitations that hinder transportation to clinic appointments.  .Services Needed at time of discharge: Y = Yes, Blank = No PT:   OT:   RN:   Equipment:   Other:     LOS: 1 day   Shela Leff, MD 07/21/2015, 2:02 PM

## 2015-07-22 LAB — IRON AND TIBC
IRON: 20 ug/dL — AB (ref 45–182)
Saturation Ratios: 11 % — ABNORMAL LOW (ref 17.9–39.5)
TIBC: 183 ug/dL — AB (ref 250–450)
UIBC: 163 ug/dL

## 2015-07-22 LAB — CBC
HCT: 38 % — ABNORMAL LOW (ref 39.0–52.0)
Hemoglobin: 12.2 g/dL — ABNORMAL LOW (ref 13.0–17.0)
MCH: 25.6 pg — ABNORMAL LOW (ref 26.0–34.0)
MCHC: 32.1 g/dL (ref 30.0–36.0)
MCV: 79.7 fL (ref 78.0–100.0)
PLATELETS: 202 10*3/uL (ref 150–400)
RBC: 4.77 MIL/uL (ref 4.22–5.81)
RDW: 15.9 % — ABNORMAL HIGH (ref 11.5–15.5)
WBC: 10.4 10*3/uL (ref 4.0–10.5)

## 2015-07-22 LAB — RETICULOCYTES
RBC.: 4.77 MIL/uL (ref 4.22–5.81)
RETIC COUNT ABSOLUTE: 33.4 10*3/uL (ref 19.0–186.0)
RETIC CT PCT: 0.7 % (ref 0.4–3.1)

## 2015-07-22 LAB — BASIC METABOLIC PANEL
ANION GAP: 8 (ref 5–15)
BUN: 16 mg/dL (ref 6–20)
CO2: 26 mmol/L (ref 22–32)
Calcium: 7.9 mg/dL — ABNORMAL LOW (ref 8.9–10.3)
Chloride: 101 mmol/L (ref 101–111)
Creatinine, Ser: 1.26 mg/dL — ABNORMAL HIGH (ref 0.61–1.24)
GFR calc Af Amer: >60 mL/min (ref >60)
GFR, EST NON AFRICAN AMERICAN: 52 mL/min — AB (ref >60)
Glucose, Bld: 99 mg/dL (ref 65–99)
POTASSIUM: 4 mmol/L (ref 3.5–5.1)
SODIUM: 135 mmol/L (ref 135–145)

## 2015-07-22 LAB — VITAMIN B12: VITAMIN B 12: 256 pg/mL (ref 180–914)

## 2015-07-22 LAB — PSA: PSA: 20.86 ng/mL — AB (ref 0.00–4.00)

## 2015-07-22 LAB — FOLATE: Folate: 18.8 ng/mL (ref >5.9)

## 2015-07-22 LAB — FERRITIN: FERRITIN: 654 ng/mL — AB (ref 24–336)

## 2015-07-22 MED ORDER — SENNOSIDES-DOCUSATE SODIUM 8.6-50 MG PO TABS
2.0000 | ORAL_TABLET | Freq: Every evening | ORAL | Status: DC | PRN
  Administered 2015-07-22: 2 via ORAL
  Filled 2015-07-22: qty 2

## 2015-07-22 NOTE — Progress Notes (Signed)
Subjective: Patient was seen and examined at bedside today. He is currently comfortable and not complaining of any pain. Denies having any fevers, chills, CP, SOB, or abdominal pain. Reports being constipated - last BM 2-3 days ago but still passing a lot of flatus.   Objective: Vital signs in last 24 hours: Filed Vitals:   07/21/15 2210 07/22/15 0534 07/22/15 1345 07/22/15 1502  BP: 167/85 169/93  156/74  Pulse: 87 83  65  Temp: 99.3 F (37.4 C) 100.1 F (37.8 C) 99.5 F (37.5 C) 99.3 F (37.4 C)  TempSrc: Oral Oral Oral Oral  Resp: 20 18  16   Height:      Weight:      SpO2: 99% 100%  99%   Weight change:   Intake/Output Summary (Last 24 hours) at 07/22/15 1751 Last data filed at 07/22/15 1525  Gross per 24 hour  Intake    820 ml  Output    800 ml  Net     20 ml   Physical Exam: General: Lying in bed, no acute distress Cardiovascular: RRR, no m/r/g. Pulses intact.  Pulmonary: Clear to auscultation bilaterally. No wheezes, rales, or rhonchi appreciated.  Abdomen: Midline reducible hernia. +BS. Abdomen is mildly distended. No tenderness on palpation. No rebound, guarding, or rigidity.  Extremities: No edema.  Lab Results: Basic Metabolic Panel:  Recent Labs Lab 07/21/15 0552 07/22/15 0643  NA 139 135  K 4.2 4.0  CL 108 101  CO2 24 26  GLUCOSE 105* 99  BUN 20 16  CREATININE 1.42* 1.26*  CALCIUM 7.7* 7.9*   Liver Function Tests:  Recent Labs Lab 07/20/15 1919  AST 49*  ALT 39  ALKPHOS 125  BILITOT 0.4  PROT 7.2  ALBUMIN 3.3*    Recent Labs Lab 07/20/15 1919  LIPASE 23   CBC:  Recent Labs Lab 07/21/15 0552 07/22/15 0643  WBC 14.1* 10.4  HGB 11.7* 12.2*  HCT 36.6* 38.0*  MCV 80.6 79.7  PLT 181 202   Urinalysis:  Recent Labs Lab 07/20/15 1957  COLORURINE YELLOW  LABSPEC 1.026  PHURINE 5.5  GLUCOSEU NEGATIVE  HGBUR LARGE*  BILIRUBINUR NEGATIVE  KETONESUR 15*  PROTEINUR 100*  NITRITE POSITIVE*  LEUKOCYTESUR MODERATE*     Micro Results: Recent Results (from the past 240 hour(s))  Blood culture (routine x 2)     Status: None (Preliminary result)   Collection Time: 07/20/15  7:48 PM  Result Value Ref Range Status   Specimen Description RIGHT ANTECUBITAL  Final   Special Requests BOTTLES DRAWN AEROBIC AND ANAEROBIC 5CC  Final   Culture NO GROWTH 2 DAYS  Final   Report Status PENDING  Incomplete  Urine culture     Status: None (Preliminary result)   Collection Time: 07/20/15  7:57 PM  Result Value Ref Range Status   Specimen Description URINE, CLEAN CATCH  Final   Special Requests Normal  Final   Culture >=100,000 COLONIES/mL ESCHERICHIA COLI  Final   Report Status PENDING  Incomplete  Blood culture (routine x 2)     Status: None (Preliminary result)   Collection Time: 07/20/15  8:45 PM  Result Value Ref Range Status   Specimen Description BLOOD RIGHT HAND  Final   Special Requests BOTTLES DRAWN AEROBIC AND ANAEROBIC 5CC  Final   Culture NO GROWTH 2 DAYS  Final   Report Status PENDING  Incomplete   Studies/Results: Ct Abdomen Pelvis Wo Contrast  07/20/2015  CLINICAL DATA:  Abdominal distention, scrotal swelling and  acute kidney injury. EXAM: CT ABDOMEN AND PELVIS WITHOUT CONTRAST TECHNIQUE: Multidetector CT imaging of the abdomen and pelvis was performed following the standard protocol without IV contrast. COMPARISON:  CT 04/23/2012 FINDINGS: Lower chest: The included lung bases are clear. Enteric contrast in the distal esophagus. Heart is prominent in size. Liver: No focal lesion allowing for lack contrast. Hepatobiliary: Postcholecystectomy with clips in the gallbladder fossa. No biliary dilatation. Pancreas: Normal. No ductal dilatation or inflammation. Small 58mm hypodensity in the mid body is unchanged from prior exam and of doubtful clinical significance. Spleen: Normal.  Small splenule at the hilum. Adrenal glands: No nodule. Kidneys: No hydronephrosis or urolithiasis. Prominent column of Bertin on  the right. Stomach/Bowel: Stomach physiologically distended. Small hiatal hernia with enteric contrast in the distal esophagus. There are no dilated or thickened small bowel loops. Small volume of stool throughout the colon without colonic wall thickening. There is herniation of the anti mesenteric border of the transverse colon with a small supraumbilical ventral abdominal wall hernia without obstruction or wall thickening. The appendix is tentatively identified and normal. Vascular/Lymphatic: Borderline 9 mm lymph node at the right iliac bifurcation, previously 6 mm. No additional retroperitoneal adenopathy. Bilateral inguinal lymph nodes retain normal morphology. Abdominal aorta is normal in caliber. Mild atherosclerosis. Reproductive: Marked enlargement of the prostate gland measuring 7.3 x 6.6 x 8.1 cm. Borders are irregular, there is mass effect on the bladder base. Bladder: Minimally distended with mild wall thickening, likely related to enlarged prostate gland. Other: No free air, free fluid, or intra-abdominal fluid collection. Fat within both inguinal canals. Imaging obtained through the scrotum demonstrates a small calcification on the left. Suggestion of scrotal thickening. No abnormal air. Limited assessment for abscess given lack of contrast. Musculoskeletal: There are no acute or suspicious osseous abnormalities. Multilevel degenerative change throughout spine. IMPRESSION: 1. Small Richter type hernia in the upper ventral abdominal wall containing the anti mesenteric border of transverse colon. No associated colonic wall thickening or obstruction. There is no small bowel obstruction. 2. Enteric contrast in the distal esophagus with small hiatal hernia, may reflect reflux or slow transit. 3. Irregular enlarged prostate gland causing mass effect on the bladder base, slight increase in size compared to prior. Recommend correlation with PSA. 4. Questionable scrotal skin thickening. No evidence of large  scrotal abscess allowing for limitations of modality and lack of contrast. Electronically Signed   By: Jeb Levering M.D.   On: 07/20/2015 22:14   Dg Chest 2 View  07/20/2015  CLINICAL DATA:  Acute onset of fever.  Initial encounter. EXAM: CHEST  2 VIEW COMPARISON:  Chest radiograph performed 08/30/2012 FINDINGS: The lungs are mildly hypoexpanded. Mild bilateral atelectasis is noted. There is no evidence of pleural effusion or pneumothorax. The heart is borderline normal in size. No acute osseous abnormalities are seen. Scattered clips are noted about the thyroid bed. IMPRESSION: Lungs mildly hypoexpanded, with mild bilateral atelectasis. Electronically Signed   By: Garald Balding M.D.   On: 07/20/2015 20:44   US Scrotum  07/21/2015  CLINICAL DATA:  Acute onset of left scrotal pain and enlargement. Initial encounter. EXAM: SCROTAL ULTRASOUND DOPPLER ULTRASOUND OF THE TESTICLES TECHNIQUE: Complete ultrasound examination of the testicles, epididymis, and other scrotal structures was performed. Color and spectral Doppler ultrasound were also utilized to evaluate blood flow to the testicles. COMPARISON:  CT of the abdomen and pelvis performed earlier today at 9:40 p.m. FINDINGS: Right testicle Measurements: 3.8 x 2.2 x 2.7 cm. No mass or microlithiasis visualized.  Left testicle Measurements: 3.4 x 2.5 x 2.9 cm. No mass or microlithiasis visualized. Right epididymis:  Normal in size and appearance. Left epididymis: The left epididymis is enlarged, measuring 4.4 x 1.1 x 3.2 cm, with associated hyperemia, compatible with epididymitis. Two cysts are seen at the left epididymal head, measuring 1.0 cm and 0.9 cm. The 0.9 cm cyst is mildly complex, with mild internal echoes. Hydrocele:  A small left hydrocele remains within normal limits. Varicocele:  Bilateral patent varicoceles are noted. Pulsed Doppler interrogation of both testes demonstrates normal low resistance arterial and venous waveforms bilaterally. Mild  diffuse skin thickening is noted. IMPRESSION: 1. Acute epididymitis noted on the left, with diffusely enlarged left epididymis and associated hyperemia. 2. Two left epididymal head cysts noted, measuring 1.0 cm and 0.9 cm. The 0.9 cm cyst is mildly complex, with mild internal echoes. 3. Bilateral patent varicoceles seen. 4. No evidence for testicular torsion. 5. Mild diffuse skin thickening noted. Electronically Signed   By: Garald Balding M.D.   On: 07/21/2015 00:30   [place] Art/ven Flow Abd Pelv Doppler  07/21/2015  CLINICAL DATA:  Acute onset of left scrotal pain and enlargement. Initial encounter. EXAM: SCROTAL ULTRASOUND DOPPLER ULTRASOUND OF THE TESTICLES TECHNIQUE: Complete ultrasound examination of the testicles, epididymis, and other scrotal structures was performed. Color and spectral Doppler ultrasound were also utilized to evaluate blood flow to the testicles. COMPARISON:  CT of the abdomen and pelvis performed earlier today at 9:40 p.m. FINDINGS: Right testicle Measurements: 3.8 x 2.2 x 2.7 cm. No mass or microlithiasis visualized. Left testicle Measurements: 3.4 x 2.5 x 2.9 cm. No mass or microlithiasis visualized. Right epididymis:  Normal in size and appearance. Left epididymis: The left epididymis is enlarged, measuring 4.4 x 1.1 x 3.2 cm, with associated hyperemia, compatible with epididymitis. Two cysts are seen at the left epididymal head, measuring 1.0 cm and 0.9 cm. The 0.9 cm cyst is mildly complex, with mild internal echoes. Hydrocele:  A small left hydrocele remains within normal limits. Varicocele:  Bilateral patent varicoceles are noted. Pulsed Doppler interrogation of both testes demonstrates normal low resistance arterial and venous waveforms bilaterally. Mild diffuse skin thickening is noted. IMPRESSION: 1. Acute epididymitis noted on the left, with diffusely enlarged left epididymis and associated hyperemia. 2. Two left epididymal head cysts noted, measuring 1.0 cm and 0.9 cm. The 0.9  cm cyst is mildly complex, with mild internal echoes. 3. Bilateral patent varicoceles seen. 4. No evidence for testicular torsion. 5. Mild diffuse skin thickening noted. Electronically Signed   By: Garald Balding M.D.   On: 07/21/2015 00:30   Medications: I have reviewed the patient's current medications. Scheduled Meds: . cefTRIAXone (ROCEPHIN)  IV  1 g Intravenous QHS  . ferrous sulfate  325 mg Oral Q breakfast  . heparin  5,000 Units Subcutaneous 3 times per day  . latanoprost  1 drop Both Eyes Daily  . levothyroxine  112 mcg Oral QAC breakfast  . pantoprazole  40 mg Oral Daily  . potassium chloride SA  20 mEq Oral Daily  . tamsulosin  0.4 mg Oral Daily   Continuous Infusions:  PRN Meds:.acetaminophen **OR** acetaminophen, gi cocktail, senna-docusate Assessment/Plan: Active Problems:   Urinary tract infection   Enlarged prostate   Complicated UTI (urinary tract infection)   Fever in adult   Pain in left testicle   Epididymitis, left   Hx of toxic multinodular goiter   Multiple gastric polyps   Postoperative hypothyroidism   Elevated PSA  Complicated Urinary Tract Infection with Epididymitis: patient is currently comfortable and not complaining of any pain. Tmax 102.1 in the past 24 hrs. However, WBC tending down from 19.8 on admission to 10.4 now. Blood culture showing no growth in 1 day. Urine culture growing E. Coli.  -cont Ceftriaxone 1 gram IV daily until blood cx negative for 72 hrs, then transition to oral  -F/u final blood culture results -Tylenol 650 mg prn pain q6h -CBC in AM  Hematuria: Could be related to UTI, although his UA in October was only remarkable for hematuria. He denies any alarm symptoms for malignancy, but he is a former smoker.  -Will require follow-up with urologist, possible cystoscopy.  Enlarged Prostate: Longstanding problem. CT suggests slight increase in size since 2013. PSA elevated at 20.86. Patient already has close follow-up in 1 week with  his urologist (appt on 11/21). No sign of malignancy based on history, physical exam, or CT abdomen. Bladder scan showing 87 ml residual volume. 24 hr urine output is 500 cc.   -Tamulosin 0.4 mg daily -In and out cath -Monitor urine output -F/u with urologist as outpatient for possible prostate biopsy   AKI: Baseline creatinine of 1.1, up to 1.79 on admission. SCr 1.26 now. Could be obstructive versus prerenal, although did not appear dry on exam.  - BMET in AM  Anemia of chronic disease: Hgb chronically low. It was in the 9s in 2013 and 11.7 in 09/2012. Hgb 12.9 on admission. Ferritin low (8) in 2013. Patient did have hematuria on his UA from 06/2015 and this admission. Anemia panel showing low iron, low TIBC, and high ferritin.  -Will require follow-up with urologist, possible cystoscopy.   GERD: Known hiatal hernia with previous upper GI bleed in December 2013 needing transfusion.  - Continue home pantoprazole 40 mg daily  Constipation - Senakot-S at bedtime  Hypothyroidism -Synthroid 112 mcg daily.  Chronic Hypokalemia: K 4.0 today. On his problem list from three years ago. Not currently on any diuretics. Unclear why he takes this medication. - Continue 20 mEq KDur daily - BMET in AM  Glaucoma -Continue lanaprost eye drops  DVT Prophylaxis: Heparin [place] Diet: Heart Healthy Admit to: Med-Surg Code Status: Full  Dispo: Disposition is deferred at this time, awaiting improvement of current medical problems.  Anticipated discharge in approximately 1-2 day(s).   The patient does have a current PCP (Rosita Fire, MD) and does need an [facility] hospital follow-up appointment after discharge.  The patient does not have transportation limitations that hinder transportation to clinic appointments.  .Services Needed at time of discharge: Y = Yes, Blank = No PT:   OT:   RN:   Equipment:   Other:     LOS: 2 days   Shela Leff, MD 07/22/2015, 5:51 PM

## 2015-07-22 NOTE — Progress Notes (Signed)
Patient ID: Victor Hunter, male   DOB: 1934/04/02, 81 y.o.   MRN: [id number] Medicine attending: Patient examined together with resident physician Dr. Shela Leff and management plan discussed. Abdomen is distended today, tympanitic, decreased bowel sounds. Difficult to assess whether or not he has residual urine in his bladder. He is voiding without difficulty. Maximum temperature yesterday up to 102. Day #3 ceftriaxone. Urine cultures growing Escherichia coli. We will check for postvoid residual. If significant residual, place Foley catheter pending further urologic evaluation. Patient's daughter present. We discussed the fact that an inpatient urology consult would not be helpful at this time. His urologist works in [place]. He has a scheduled appointment next week. If cystoscopy or prostate biopsies are necessary these are done as outpatient procedures. We discussed our main goal right now is to get his infection under control and that he is making progress in this regard. When blood cultures are negative for 72 hours we can transition him to an oral antibiotic and consider early discharge if otherwise stable.

## 2015-07-22 NOTE — Evaluation (Signed)
Physical Therapy Evaluation Patient Details Name: Victor Hunter MRN: [id number] DOB: October 26, 1933 Today's Date: 07/22/2015   History of Present Illness  Patient is a 81 y/o male with hx of BPH (s/p TURP 2008), chronic hiccups with GERD, HTN, hypothyroidism who presents with dysuria. In the ED, pt had a maximum temperature of 101.9 with a leukocytosis of 19.8.UA demonstrated positive nitrites, moderate leukocytes, hemoglobinuria, ketonuria, and proteinuria. CT abdomen pelvis identified an irregular, enlarged prostate gland causing mass effect on the bladder base, scrotal skin thickening, and upper GI findings consistent GERD. A scrotal ultrasound with dopplers demonstrated left epididymitis with hypemia without evidence of torsion.CXR-bilateral atelectasis  Clinical Impression  Patient presents with mild balance deficits and impaired endurance s/p above impacting safe mobility. Tolerated gait training and stair training with Min A-min guard assist for safety. Might benefit from use of [facility] for balance next session. Pt lives alone but has good family support from daughters. Encouraged daily mobility with RN. Anticipate pt's strength and mobility will improve quickly with increased activity. Will follow acutely to maximize independence and mobilty prior to return home.    Follow Up Recommendations Home health PT;Supervision for mobility/OOB    Equipment Recommendations  Other (comment) (TBD)    Recommendations for Other Services       Precautions / Restrictions Precautions Precautions: Fall Restrictions Weight Bearing Restrictions: No      Mobility  Bed Mobility Overal bed mobility: Needs Assistance Bed Mobility: Supine to Sit     Supine to sit: Min guard;HOB elevated     General bed mobility comments: Some difficulty and increaed effort to get to EOB. Use of rail.  Transfers Overall transfer level: Needs assistance Equipment used: None Transfers: Sit to/from Stand Sit to  Stand: Min guard         General transfer comment: Min guard for safety. unsteady upon standing.  Ambulation/Gait Ambulation/Gait assistance: Min guard Ambulation Distance (Feet): 150 Feet (+ 100' ) Assistive device: None Gait Pattern/deviations: Step-through pattern;Drifts right/left;Staggering right;Staggering left Gait velocity: decreased Gait velocity interpretation: <1.8 ft/sec, indicative of risk for recurrent falls General Gait Details: MIldly unsteady gait with drifting noted at times esp with head turns. No overt LOB. 1 seated rest break. VSS.  Stairs Stairs: Yes Stairs assistance: Min assist Stair Management: One rail Right;Alternating pattern Number of Stairs: 4 General stair comments: Some difficulty noted on steps with almost LOB due to increased speed. Cues for safety.  Wheelchair Mobility    Modified Rankin (Stroke Patients Only)       Balance Overall balance assessment: Needs assistance Sitting-balance support: Feet supported;No upper extremity supported Sitting balance-Leahy Scale: Good     Standing balance support: During functional activity Standing balance-Leahy Scale: Fair Standing balance comment: Tolerated standing for urination without UE support.                             Pertinent Vitals/Pain Pain Assessment: No/denies pain    Home Living Family/patient expects to be discharged to:: Private residence Living Arrangements: Alone Available Help at Discharge: Family;Available PRN/intermittently Type of Home: House Home Access: Stairs to enter Entrance Stairs-Rails: None Entrance Stairs-Number of Steps: 2 Home Layout: One level Home Equipment: Bedside commode;Walker - 2 wheels      Prior Function Level of Independence: Independent         Comments: Drives, works as Presenter, broadcasting.     Hand Dominance   Dominant Hand: Right    Extremity/Trunk Assessment  Upper Extremity Assessment: Defer to OT evaluation            Lower Extremity Assessment: Generalized weakness         Communication   Communication: No difficulties  Cognition Arousal/Alertness: Awake/alert Behavior During Therapy: WFL for tasks assessed/performed Overall Cognitive Status: Within Functional Limits for tasks assessed                      General Comments General comments (skin integrity, edema, etc.): 2 daughters present in room during session.    Exercises        Assessment/Plan    PT Assessment Patient needs continued PT services  PT Diagnosis Difficulty walking;Generalized weakness   PT Problem List Decreased strength;Decreased activity tolerance;Decreased balance;Decreased mobility;Decreased safety awareness  PT Treatment Interventions Balance training;Gait training;Stair training;Functional mobility training;Therapeutic activities;Therapeutic exercise;Patient/family education   PT Goals (Current goals can be found in the Care Plan section) Acute Rehab PT Goals Patient Stated Goal: to go home PT Goal Formulation: With patient Time For Goal Achievement: 08/05/15 Potential to Achieve Goals: Fair    Frequency Min 3X/week   Barriers to discharge Decreased caregiver support Pt lives alone    Co-evaluation               End of Session Equipment Utilized During Treatment: Gait belt Activity Tolerance: Patient tolerated treatment well Patient left: in chair;with call bell/phone within reach;with family/visitor present;with nursing/sitter in room Nurse Communication: Mobility status         Time: [id number] PT Time Calculation (min) (ACUTE ONLY): 19 min   Charges:   PT Evaluation $Initial PT Evaluation Tier I: 1 Procedure     PT G Codes:        Dejha King A Teruko Joswick 07/22/2015, 12:06 PM Wray Kearns, [place], DPT [phone]

## 2015-07-23 DIAGNOSIS — I10 Essential (primary) hypertension: Secondary | ICD-10-CM | POA: Insufficient documentation

## 2015-07-23 LAB — CBC
HEMATOCRIT: 37.9 % — AB (ref 39.0–52.0)
HEMOGLOBIN: 12 g/dL — AB (ref 13.0–17.0)
MCH: 25.1 pg — ABNORMAL LOW (ref 26.0–34.0)
MCHC: 31.7 g/dL (ref 30.0–36.0)
MCV: 79.3 fL (ref 78.0–100.0)
Platelets: 207 10*3/uL (ref 150–400)
RBC: 4.78 MIL/uL (ref 4.22–5.81)
RDW: 15.8 % — AB (ref 11.5–15.5)
WBC: 10.4 10*3/uL (ref 4.0–10.5)

## 2015-07-23 LAB — URINE CULTURE: Special Requests: NORMAL

## 2015-07-23 LAB — BASIC METABOLIC PANEL
ANION GAP: 6 (ref 5–15)
BUN: 16 mg/dL (ref 6–20)
CALCIUM: 8.1 mg/dL — AB (ref 8.9–10.3)
CHLORIDE: 99 mmol/L — AB (ref 101–111)
CO2: 30 mmol/L (ref 22–32)
Creatinine, Ser: 1.22 mg/dL (ref 0.61–1.24)
GFR calc non Af Amer: 55 mL/min — ABNORMAL LOW (ref >60)
Glucose, Bld: 108 mg/dL — ABNORMAL HIGH (ref 65–99)
Potassium: 4.2 mmol/L (ref 3.5–5.1)
SODIUM: 135 mmol/L (ref 135–145)

## 2015-07-23 MED ORDER — SENNOSIDES-DOCUSATE SODIUM 8.6-50 MG PO TABS: 2.0000 | ORAL_TABLET | Freq: Every evening | ORAL | Status: DC | PRN

## 2015-07-23 MED ORDER — HYDROCHLOROTHIAZIDE 25 MG PO TABS
25.0000 mg | ORAL_TABLET | Freq: Every day | ORAL | Status: DC
  Filled 2015-07-23: qty 1

## 2015-07-23 MED ORDER — DILTIAZEM HCL ER 180 MG PO CP24
180.0000 mg | ORAL_CAPSULE | Freq: Every day | ORAL | Status: DC
  Administered 2015-07-23: 180 mg via ORAL
  Filled 2015-07-23 (×2): qty 1

## 2015-07-23 MED ORDER — CEFUROXIME AXETIL 500 MG PO TABS: 500.0000 mg | ORAL_TABLET | Freq: Two times a day (BID) | ORAL | Status: DC

## 2015-07-23 NOTE — Progress Notes (Signed)
Patient ID: Victor Hunter, male   DOB: 27-Dec-1933, 81 y.o.   MRN: [id number] Medicine attending discharge note: I personally interviewed and examined this patient on the day of discharge and discussed discharge management plan with the patient and his daughter at the bedside. I attest to the accuracy of the evaluation and management plan as recorded in the progress note on day of discharge by resident physician Dr. Shela Leff.  Clinical summary: 81 year old man with known prostatic hypertrophy status post TURP in 2008. He was initially evaluated for hematuria on October 14 in the emergency department. Exam was normal with no abdominal or CVA tenderness. Urine analysis positive for bacteria and hemoglobin, He was put on a 7 day course of cephalexin. Cultures not obtained. He returned to the emergency department at this time with complaints of urinary urgency, dysuria, frequency, and sensation of incomplete emptying of his bladder He was found to be febrile to 101.9. Urinalysis appeared grossly infected. CBC showed elevated white count 19,800. He also complained of left testicular pain. He was sent for a CT scan of his abdomen and pelvis and a scrotal ultrasound. Findings were marked enlargement of his prostate gland with mass effect on the base of the bladder. Minimal distention of the bladder with mild wall thickening. No gross evidence for a scrotal abscess. Ultrasound showed no mass. Small bilateral varicoceles. Mild diffuse skin thickening on the left. Diffusely enlarged left epididymis consistent with acute epididymitis. He has been followed by a urologist in [place]. He has known elevation of PSA which is being monitored.  Hospital course: Cultures were obtained and he was started on parenteral antibiotics with ceftriaxone. Cultures subsequently grew Escherichia coli sensitive to cephalosporins. He had initial fever as high as 102 but over the course of the admission temperature curve  fell and he was afebrile for greater than 24 hours at time of discharge. White blood count trending down from admission value of 19,800 to discharge value of 10,400. Hemoglobin remained stable at 12. Initial creatinine was 1.8 with BUN 24. With gentle hydration BUN fell to 16 with creatinine 1.2 at discharge November 18. He was transitioned to oral cephalexin with a planned total 10 day course of antibiotics. He was checked for post void residual in view of concern of bladder outlet obstruction from an enlarging prostate gland and no significant postvoid residual urine was found. PSA was done and was significantly elevated at 21 units with no baseline value or comparison but history of elevated PSA in the past.  Antihypertensives were held on admission and resumed at discharge.  He developed a ileus likely related to constipation and was given laxatives.  Disposition: Condition stable at time of discharge. Follow-up with his primary care physician Dr. Legrand Rams in [place] and his urologist in [place] with an appointment already scheduled for Monday. He will likely need reevaluation of his prostate with follow-up biopsies in view of the significant elevation of his PSA if this is higher than his previous baseline. He may need additional medical therapy to shrink his prostate. He is currently on Flomax alone. There were no complications.

## 2015-07-23 NOTE — Progress Notes (Signed)
Made Dr. Melburn Hake aware of pt bp 175/69. No new orders given at this time. Will continue to monitor and make day RN aware.

## 2015-07-23 NOTE — Care Management Important Message (Signed)
Important Message  Patient Details  Name: Victor Hunter MRN: [id number] Date of Birth: 06/02/1934   Medicare Important Message Given:  Yes    Chayson Charters Abena 07/23/2015, 11:51 AM

## 2015-07-23 NOTE — Progress Notes (Signed)
Pt BP 173/82, rechecked BP 171/92. MD paged. Waiting on orders to be placed or to be called back. Will continue to monitor.

## 2015-07-23 NOTE — Discharge Instructions (Signed)
-  Please take Ceftin 500 mg twice daily for 7 days  -Please take Senokot-S as instructed at bedtime for constipation. In addition, increase your dietary fiber intake.    -Please go to the appointment with your Urologist on Monday (07/26/15).

## 2015-07-23 NOTE — Progress Notes (Signed)
Physical Therapy Treatment Patient Details Name: Victor Hunter MRN: [id number] DOB: 1934/07/11 Today's Date: 07/23/2015    History of Present Illness Patient is a 81 y/o male with hx of BPH (s/p TURP 2008), chronic hiccups with GERD, HTN, hypothyroidism who presents with dysuria. In the ED, pt had a maximum temperature of 101.9 with a leukocytosis of 19.8.UA demonstrated positive nitrites, moderate leukocytes, hemoglobinuria, ketonuria, and proteinuria. CT abdomen pelvis identified an irregular, enlarged prostate gland causing mass effect on the bladder base, scrotal skin thickening, and upper GI findings consistent GERD. A scrotal ultrasound with dopplers demonstrated left epididymitis with hypemia without evidence of torsion.CXR-bilateral atelectasis    PT Comments    Pt ambulated 340' without an assistive device independently, no loss of balance. Encouraged pt to walk in halls 3x/day. PT goals met, no further PT indicated. PT signing off. No f/u PT needed.   Follow Up Recommendations  No PT follow up     Equipment Recommendations  None recommended by PT (TBD)    Recommendations for Other Services       Precautions / Restrictions Precautions Precautions: Fall Precaution Comments: 1 fall in past year, pt reports he fell getting up out of a chair on 07/18/15 Restrictions Weight Bearing Restrictions: No    Mobility  Bed Mobility               General bed mobility comments: NT-up in chair  Transfers Overall transfer level: Modified independent Equipment used: None Transfers: Sit to/from Stand Sit to Stand: Modified independent (Device/Increase time)         General transfer comment: uses armrests to push up  Ambulation/Gait Ambulation/Gait assistance: Independent Ambulation Distance (Feet): 340 Feet Assistive device: None Gait Pattern/deviations: WFL(Within Functional Limits)   Gait velocity interpretation: at or above normal speed for age/gender General  Gait Details: steady without AD, no drifting with head turns   Stairs            Wheelchair Mobility    Modified Rankin (Stroke Patients Only)       Balance     Sitting balance-Leahy Scale: Good       Standing balance-Leahy Scale: Good                      Cognition Arousal/Alertness: Awake/alert Behavior During Therapy: WFL for tasks assessed/performed Overall Cognitive Status: Within Functional Limits for tasks assessed                      Exercises      General Comments        Pertinent Vitals/Pain Pain Assessment: No/denies pain    Home Living                      Prior Function            PT Goals (current goals can now be found in the care plan section) Acute Rehab PT Goals Patient Stated Goal: to go home, likes to watch action movies PT Goal Formulation: With patient Time For Goal Achievement: 08/05/15 Potential to Achieve Goals: Good Progress towards PT goals: Goals met/education completed, patient discharged from PT    Frequency  Min 3X/week    PT Plan Current plan remains appropriate    Co-evaluation             End of Session Equipment Utilized During Treatment: Gait belt Activity Tolerance: Patient tolerated treatment well Patient left: in chair;with call bell/phone  within reach;with family/visitor present     Time: 0919-0931 PT Time Calculation (min) (ACUTE ONLY): 12 min  Charges:  $Gait Training: 8-22 mins                    G Codes:      Philomena Doheny 07/23/2015, 9:40 AM [phone]

## 2015-07-23 NOTE — Progress Notes (Signed)
Subjective: Patient was seen and examined at bedside today. He is currently comfortable and not complaining of any pain. Denies having any fevers, chills, CP, SOB, or abdominal pain. States he is still constipated but passing a lot of flatus.   Objective: Vital signs in last 24 hours: Filed Vitals:   07/22/15 1502 07/22/15 2305 07/23/15 0027 07/23/15 0616  BP: 156/74 173/82 171/92 175/69  Pulse: 65 60  66  Temp: 99.3 F (37.4 C) 98.6 F (37 C)  98.4 F (36.9 C)  TempSrc: Oral Oral  Oral  Resp: 16 24 20 20   Height:      Weight:      SpO2: 99% 98%  99%   Weight change:   Intake/Output Summary (Last 24 hours) at 07/23/15 1108 Last data filed at 07/23/15 0800  Gross per 24 hour  Intake    410 ml  Output    270 ml  Net    140 ml   Physical Exam: General: Lying in bed, no acute distress Cardiovascular: RRR, no m/r/g. Pulses intact.  Pulmonary: Clear to auscultation bilaterally. No wheezes, rales, or rhonchi appreciated.  Abdomen: Midline reducible hernia. +BS. Abdomen is mildly distended. No tenderness on palpation. No rebound, guarding, or rigidity.  Extremities: No edema.  Lab Results: Basic Metabolic Panel:  Recent Labs Lab 07/22/15 0643 07/23/15 0712  NA 135 135  K 4.0 4.2  CL 101 99*  CO2 26 30  GLUCOSE 99 108*  BUN 16 16  CREATININE 1.26* 1.22  CALCIUM 7.9* 8.1*   Liver Function Tests:  Recent Labs Lab 07/20/15 1919  AST 49*  ALT 39  ALKPHOS 125  BILITOT 0.4  PROT 7.2  ALBUMIN 3.3*    Recent Labs Lab 07/20/15 1919  LIPASE 23   CBC:  Recent Labs Lab 07/22/15 0643 07/23/15 0712  WBC 10.4 10.4  HGB 12.2* 12.0*  HCT 38.0* 37.9*  MCV 79.7 79.3  PLT 202 207   Urinalysis:  Recent Labs Lab 07/20/15 1957  COLORURINE YELLOW  LABSPEC 1.026  PHURINE 5.5  GLUCOSEU NEGATIVE  HGBUR LARGE*  BILIRUBINUR NEGATIVE  KETONESUR 15*  PROTEINUR 100*  NITRITE POSITIVE*  LEUKOCYTESUR MODERATE*    Micro Results: Recent Results (from the past  240 hour(s))  Blood culture (routine x 2)     Status: None (Preliminary result)   Collection Time: 07/20/15  7:48 PM  Result Value Ref Range Status   Specimen Description RIGHT ANTECUBITAL  Final   Special Requests BOTTLES DRAWN AEROBIC AND ANAEROBIC 5CC  Final   Culture NO GROWTH 3 DAYS  Final   Report Status PENDING  Incomplete  Urine culture     Status: None   Collection Time: 07/20/15  7:57 PM  Result Value Ref Range Status   Specimen Description URINE, CLEAN CATCH  Final   Special Requests Normal  Final   Culture >=100,000 COLONIES/mL ESCHERICHIA COLI  Final   Report Status 07/23/2015 FINAL  Final   Organism ID, Bacteria ESCHERICHIA COLI  Final      Susceptibility   Escherichia coli - MIC*    AMPICILLIN <=2 SENSITIVE Sensitive     CEFAZOLIN <=4 SENSITIVE Sensitive     CEFTRIAXONE <=1 SENSITIVE Sensitive     CIPROFLOXACIN <=0.25 SENSITIVE Sensitive     GENTAMICIN <=1 SENSITIVE Sensitive     IMIPENEM <=0.25 SENSITIVE Sensitive     NITROFURANTOIN <=16 SENSITIVE Sensitive     TRIMETH/SULFA <=20 SENSITIVE Sensitive     AMPICILLIN/SULBACTAM <=2 SENSITIVE Sensitive  PIP/TAZO <=4 SENSITIVE Sensitive     * >=100,000 COLONIES/mL ESCHERICHIA COLI  Blood culture (routine x 2)     Status: None (Preliminary result)   Collection Time: 07/20/15  8:45 PM  Result Value Ref Range Status   Specimen Description BLOOD RIGHT HAND  Final   Special Requests BOTTLES DRAWN AEROBIC AND ANAEROBIC 5CC  Final   Culture NO GROWTH 3 DAYS  Final   Report Status PENDING  Incomplete   Studies/Results: No results found. Medications: I have reviewed the patient's current medications. Scheduled Meds: . diltiazem  180 mg Oral Daily  . heparin  5,000 Units Subcutaneous 3 times per day  . latanoprost  1 drop Both Eyes Daily  . levothyroxine  112 mcg Oral QAC breakfast  . pantoprazole  40 mg Oral Daily  . potassium chloride SA  20 mEq Oral Daily  . tamsulosin  0.4 mg Oral Daily   Continuous Infusions:    PRN Meds:.acetaminophen **OR** acetaminophen, gi cocktail, senna-docusate Assessment/Plan: Active Problems:   Urinary tract infection   Enlarged prostate   Complicated UTI (urinary tract infection)   Fever in adult   Pain in left testicle   Epididymitis, left   Hx of toxic multinodular goiter   Multiple gastric polyps   Postoperative hypothyroidism   Elevated PSA  Complicated Urinary Tract Infection with Epididymitis: patient is currently comfortable and not complaining of any pain. Afebrile for over a day now. Leukocytosis has resolved. Urine culture growing E. Coli. Blood culture showing no growth in 3 days. Patient is stable and will likely be discharged to home today.  -IV Ceftriaxone discontinued. Patient will resume oral Ceftin 500 mg BID x 7 days upon discharge.  -F/u final blood culture results -Tylenol 650 mg prn pain q6h  Hematuria: Could be related to UTI, although his UA in October was only remarkable for hematuria. He denies any alarm symptoms for malignancy, but he is a former smoker.  -Will require follow-up with urologist, possible cystoscopy. Patient told us he has an appointment with his urologist on Monday (07/26/15).   Enlarged Prostate: Longstanding problem. CT suggests slight increase in size since 2013. PSA elevated at 20.86. Patient already has close follow-up in 1 week with his urologist (appt on 11/21). No sign of malignancy based on history, physical exam, or CT abdomen. Bladder scan yesterday showing 87 ml residual volume. 24 hr urine output is 570 cc.   -Tamulosin 0.4 mg daily -In and out cath -Monitor urine output -F/u with urologist as outpatient for possible prostate biopsy   AKI: Baseline creatinine of 1.1, up to 1.79 on admission. SCr 1.22 now. Could be obstructive versus prerenal, although did not appear dry on exam.   Anemia of chronic disease: Hgb chronically low. It was in the 9s in 2013 and 11.7 in 09/2012. Hgb 12.9 on admission. Ferritin low (8)  in 2013. Patient did have hematuria on his UA from 06/2015 and this admission. Anemia panel showing low iron, low TIBC, and high ferritin.  -Will require follow-up with urologist, possible cystoscopy.   GERD: Known hiatal hernia with previous upper GI bleed in December 2013 needing transfusion.  - Continue home pantoprazole 40 mg daily  Constipation - Senakot-S at bedtime  Hypothyroidism -Synthroid 112 mcg daily.  HTN: BP 171/92 today. -restarted home med Diltiazem ER 180 mg daily   Chronic Hypokalemia: K 4.0 today. On his problem list from three years ago. Not currently on any diuretics. Unclear why he takes this medication. - Continue 20  mEq KDur daily - BMET in AM  Glaucoma -Continue lanaprost eye drops  DVT Prophylaxis: Heparin Brooks Diet: Heart Healthy Admit to: Med-Surg Code Status: Full  Dispo: Disposition is deferred at this time, awaiting improvement of current medical problems.  Anticipated discharge in approximately 0 day(s).   The patient does have a current PCP (Rosita Fire, MD) and does need an [facility] hospital follow-up appointment after discharge.  The patient does not have transportation limitations that hinder transportation to clinic appointments.  .Services Needed at time of discharge: Y = Yes, Blank = No PT:   OT:   RN:   Equipment:   Other:     LOS: 3 days   Shela Leff, MD 07/23/2015, 11:08 AM

## 2015-07-23 NOTE — Discharge Summary (Signed)
Name: Victor Hunter MRN: [id number] DOB: [birth year] 81 y.o. PCP: Victor Fire, MD  Date of Admission: 07/20/2015  7:17 PM Date of Discharge: 07/25/2015 Attending Physician: No att. providers found  Discharge Diagnosis: 1.  Active Problems:   Urinary tract infection   Enlarged prostate   Complicated UTI (urinary tract infection)   Fever in adult   Pain in left testicle   Epididymitis, left   Hx of toxic multinodular goiter   Multiple gastric polyps   Postoperative hypothyroidism   Elevated PSA   Benign essential HTN  Discharge Medications:   Medication List    TAKE these medications        calcium carbonate 500 MG chewable tablet  Commonly known as:  TUMS - dosed in mg elemental calcium  Chew 2 tablets by mouth daily as needed. Acid Reflux     cefUROXime 500 MG tablet  Commonly known as:  CEFTIN  Take 1 tablet (500 mg total) by mouth 2 (two) times daily with a meal.     diltiazem 180 MG 24 hr capsule  Commonly known as:  DILACOR XR  Take 180 mg by mouth daily.     ferrous sulfate 325 (65 FE) MG tablet  Take 1 tablet (325 mg total) by mouth daily with breakfast.     latanoprost 0.005 % ophthalmic solution  Commonly known as:  XALATAN  Place 1 drop into both eyes every morning.     levothyroxine 112 MCG tablet  Commonly known as:  SYNTHROID, LEVOTHROID  Take 112 mcg by mouth daily before breakfast.     pantoprazole 40 MG tablet  Commonly known as:  PROTONIX  Take 1 tablet (40 mg total) by mouth 2 (two) times daily before a meal.     potassium chloride SA 20 MEQ tablet  Commonly known as:  K-DUR,KLOR-CON  Take 20 mEq by mouth daily.     senna-docusate 8.6-50 MG tablet  Commonly known as:  Senokot-S  Take 2 tablets by mouth at bedtime as needed for mild constipation.     tamsulosin 0.4 MG Caps capsule  Commonly known as:  FLOMAX  Take 0.4 mg by mouth daily.        Disposition and follow-up:   Mr.Victor Hunter was discharged from [facility] in Stable condition.  At the hospital follow up visit please address:  1.  Complicated Urinary Tract Infection with Epididymitis - has he finished the course of antibiotic?   Hematuria - consider doing cystoscopy to look for a bladder mass.   Enlarged prostate - monitor PSA and consider doing prostate biopsy.   2.  Labs / imaging needed at time of follow-up: CBC, BMP, PSA, cystoscopy, prostate biopsy  3.  Pending labs/ test needing follow-up:  Follow-up Appointments: Follow-up Information    Follow up with [facility], [place]. Go on 07/27/2015.   Specialty:  Internal Medicine   Why:  Follow up apointment at 10:50 am.    Contact information:   [place] [address] [phone]       Discharge Instructions:   Consultations:    Procedures Performed:  Ct Abdomen Pelvis Wo Contrast  07/20/2015  CLINICAL DATA:  Abdominal distention, scrotal swelling and acute kidney injury. EXAM: CT ABDOMEN AND PELVIS WITHOUT CONTRAST TECHNIQUE: Multidetector CT imaging of the abdomen and pelvis was performed following the standard protocol without IV contrast. COMPARISON:  CT 04/23/2012 FINDINGS: Lower chest: The included lung bases are clear. Enteric contrast in the  distal esophagus. Heart is prominent in size. Liver: No focal lesion allowing for lack contrast. Hepatobiliary: Postcholecystectomy with clips in the gallbladder fossa. No biliary dilatation. Pancreas: Normal. No ductal dilatation or inflammation. Small 3mm hypodensity in the mid body is unchanged from prior exam and of doubtful clinical significance. Spleen: Normal.  Small splenule at the hilum. Adrenal glands: No nodule. Kidneys: No hydronephrosis or urolithiasis. Prominent column of Bertin on the right. Stomach/Bowel: Stomach physiologically distended. Small hiatal hernia with enteric contrast in the distal esophagus. There are no dilated or thickened small bowel loops. Small volume of stool  throughout the colon without colonic wall thickening. There is herniation of the anti mesenteric border of the transverse colon with a small supraumbilical ventral abdominal wall hernia without obstruction or wall thickening. The appendix is tentatively identified and normal. Vascular/Lymphatic: Borderline 9 mm lymph node at the right iliac bifurcation, previously 6 mm. No additional retroperitoneal adenopathy. Bilateral inguinal lymph nodes retain normal morphology. Abdominal aorta is normal in caliber. Mild atherosclerosis. Reproductive: Marked enlargement of the prostate gland measuring 7.3 x 6.6 x 8.1 cm. Borders are irregular, there is mass effect on the bladder base. Bladder: Minimally distended with mild wall thickening, likely related to enlarged prostate gland. Other: No free air, free fluid, or intra-abdominal fluid collection. Fat within both inguinal canals. Imaging obtained through the scrotum demonstrates a small calcification on the left. Suggestion of scrotal thickening. No abnormal air. Limited assessment for abscess given lack of contrast. Musculoskeletal: There are no acute or suspicious osseous abnormalities. Multilevel degenerative change throughout spine. IMPRESSION: 1. Small Richter type hernia in the upper ventral abdominal wall containing the anti mesenteric border of transverse colon. No associated colonic wall thickening or obstruction. There is no small bowel obstruction. 2. Enteric contrast in the distal esophagus with small hiatal hernia, may reflect reflux or slow transit. 3. Irregular enlarged prostate gland causing mass effect on the bladder base, slight increase in size compared to prior. Recommend correlation with PSA. 4. Questionable scrotal skin thickening. No evidence of large scrotal abscess allowing for limitations of modality and lack of contrast. Electronically Signed   By: Jeb Levering M.D.   On: 07/20/2015 22:14   Dg Chest 2 View  07/20/2015  CLINICAL DATA:  Acute  onset of fever.  Initial encounter. EXAM: CHEST  2 VIEW COMPARISON:  Chest radiograph performed 08/30/2012 FINDINGS: The lungs are mildly hypoexpanded. Mild bilateral atelectasis is noted. There is no evidence of pleural effusion or pneumothorax. The heart is borderline normal in size. No acute osseous abnormalities are seen. Scattered clips are noted about the thyroid bed. IMPRESSION: Lungs mildly hypoexpanded, with mild bilateral atelectasis. Electronically Signed   By: Garald Balding M.D.   On: 07/20/2015 20:44   US Scrotum  07/21/2015  CLINICAL DATA:  Acute onset of left scrotal pain and enlargement. Initial encounter. EXAM: SCROTAL ULTRASOUND DOPPLER ULTRASOUND OF THE TESTICLES TECHNIQUE: Complete ultrasound examination of the testicles, epididymis, and other scrotal structures was performed. Color and spectral Doppler ultrasound were also utilized to evaluate blood flow to the testicles. COMPARISON:  CT of the abdomen and pelvis performed earlier today at 9:40 p.m. FINDINGS: Right testicle Measurements: 3.8 x 2.2 x 2.7 cm. No mass or microlithiasis visualized. Left testicle Measurements: 3.4 x 2.5 x 2.9 cm. No mass or microlithiasis visualized. Right epididymis:  Normal in size and appearance. Left epididymis: The left epididymis is enlarged, measuring 4.4 x 1.1 x 3.2 cm, with associated hyperemia, compatible with epididymitis. Two cysts are  seen at the left epididymal head, measuring 1.0 cm and 0.9 cm. The 0.9 cm cyst is mildly complex, with mild internal echoes. Hydrocele:  A small left hydrocele remains within normal limits. Varicocele:  Bilateral patent varicoceles are noted. Pulsed Doppler interrogation of both testes demonstrates normal low resistance arterial and venous waveforms bilaterally. Mild diffuse skin thickening is noted. IMPRESSION: 1. Acute epididymitis noted on the left, with diffusely enlarged left epididymis and associated hyperemia. 2. Two left epididymal head cysts noted, measuring  1.0 cm and 0.9 cm. The 0.9 cm cyst is mildly complex, with mild internal echoes. 3. Bilateral patent varicoceles seen. 4. No evidence for testicular torsion. 5. Mild diffuse skin thickening noted. Electronically Signed   By: Garald Balding M.D.   On: 07/21/2015 00:30   [place] Art/ven Flow Abd Pelv Doppler  07/21/2015  CLINICAL DATA:  Acute onset of left scrotal pain and enlargement. Initial encounter. EXAM: SCROTAL ULTRASOUND DOPPLER ULTRASOUND OF THE TESTICLES TECHNIQUE: Complete ultrasound examination of the testicles, epididymis, and other scrotal structures was performed. Color and spectral Doppler ultrasound were also utilized to evaluate blood flow to the testicles. COMPARISON:  CT of the abdomen and pelvis performed earlier today at 9:40 p.m. FINDINGS: Right testicle Measurements: 3.8 x 2.2 x 2.7 cm. No mass or microlithiasis visualized. Left testicle Measurements: 3.4 x 2.5 x 2.9 cm. No mass or microlithiasis visualized. Right epididymis:  Normal in size and appearance. Left epididymis: The left epididymis is enlarged, measuring 4.4 x 1.1 x 3.2 cm, with associated hyperemia, compatible with epididymitis. Two cysts are seen at the left epididymal head, measuring 1.0 cm and 0.9 cm. The 0.9 cm cyst is mildly complex, with mild internal echoes. Hydrocele:  A small left hydrocele remains within normal limits. Varicocele:  Bilateral patent varicoceles are noted. Pulsed Doppler interrogation of both testes demonstrates normal low resistance arterial and venous waveforms bilaterally. Mild diffuse skin thickening is noted. IMPRESSION: 1. Acute epididymitis noted on the left, with diffusely enlarged left epididymis and associated hyperemia. 2. Two left epididymal head cysts noted, measuring 1.0 cm and 0.9 cm. The 0.9 cm cyst is mildly complex, with mild internal echoes. 3. Bilateral patent varicoceles seen. 4. No evidence for testicular torsion. 5. Mild diffuse skin thickening noted. Electronically Signed   By:  Garald Balding M.D.   On: 07/21/2015 00:30   Admission HPI: Mr. Dun is a 81 year old gentleman with a past medical history of BPH (s/p TURP 2008), chronic hiccups with GERD, HTN, hypothyroidism who presents with dysuria. This problem started on 11/13 when he noticed increased urinary urgency, reduced urine volume when going to the bathroom, a sensation of not emptying his bladder, blood in his urine, and pain with urination. He also endorses new onset tenderness in his left testicle which came on gradually and not suddenly. Mr. Griego has also had chills and night sweats, but he did not take his temperature at home. Otherwise, he describes longstanding, intermittent hiccups and some mild constipation. He denies any light-headedness, headache, chest pain, shortness of breath, new weakness, new numbness or tingling in his feet, back pain, unintended weight loss, nausea, vomiting, diarrhea, or recent surgeries. Notably, he was in in the ED on 10/14 for hematuria. At that time, his UA was negative for nitrites and leukocytes, only remarkable for hemoglobinuria. No urine cultures were obtained. He was discharged from the ED on a 7 day course of cephalexin 500 mg BID. He reported improving on those antibiotics before these symptoms re-emerged. He  is followed by a urologist in [place] for his BPH and has an appointment scheduled next week. He has had elevated PSAs in the past. He currently works as a Presenter, broadcasting, formerly as a Administrator. He has never worked in a [place] with dyes. He is a former smoker and quit in 1994, previously smoking 1/2 ppd. He is a non-drinker. He lives at home by himself but receives great support from his four daughters, who were present.  In the ED, he had a maximum temperature of 101.9 with a leukocytosis of 19.8. His other vitals were stable. A UA demonstrated positive nitrites, moderate leukocytes, hemoglobinuria, ketonuria, and proteinuria. CT abdomen pelvis identified an  irregular, enlarged prostate gland causing mass effect on the bladder base, scrotal skin thickening, and upper GI findings consistent GERD. A scrotal ultrasound with dopplers demonstrated left epididymitis with hypemia without evidence of torsion. CXR just showed mild bilateral atelectasis   Hospital Course by problem list: Active Problems:   Urinary tract infection   Enlarged prostate   Complicated UTI (urinary tract infection)   Fever in adult   Pain in left testicle   Epididymitis, left   Hx of toxic multinodular goiter   Multiple gastric polyps   Postoperative hypothyroidism   Elevated PSA   Benign essential HTN  Complicated Urinary Tract Infection with Epididymitis: Patient was febrile on admission and UA consistent with infection. White count elevated to 19.8. He also complained of left testicular pain. Urine culture growing E. Coli. CT scan of abdomen and pelvis showing irregular enlarged prostate gland causing mass effect on the bladder base, questionable scrotal skin thickening, and no evidence of large scrotal abscess.  [place] of scrotum showing acute epididymitis on the left, with diffusely enlarged left epididymis and associated hyperemia. In addition, two left epididymal head cysts noted, measuring 1.0 cm and 0.9 cm. The 0.9 cm cyst is mildly complex, with mild internal echoes. Bilateral patent varicoceles also seen. No evidence for testicular torsion. He was treated with IV Ceftriaxone. On the day of discharge patient was not complaining of any pain. He was afebrile for >24 hrs and white count trended down to 10.4. Blood culture showing no growth in 5 days. Patient is to resume oral Ceftin 500 mg BID x 7 days upon discharge and follow-up with his PCP on 07/27/15 and urologist on 07/26/15.   Hematuria: Could be related to UTI, although his UA in October was only remarkable for hematuria. He denies any alarm symptoms for malignancy but is a former smoker. Patient will require follow-up  with his urologist for a possible cystoscopy. He told us he has an appointment with urology on Monday (07/26/15).   Enlarged Prostate: Longstanding problem. CT suggests slight increase in size since 2013. PSA elevated at 20.86 with no baseline value for comparison but history of elevated PSA in the past. Bladder scan showing no significant post-void residual volume. Patient has been advised to follow-up with his urologist for possible prostate biopsy. As per patient, he has an appointment with his urologist on 07/26/15.   AKI: Baseline creatinine of 1.1, up to 1.79 on admission. Could be obstructive versus prerenal, although did not appear dry on exam. SCr trended down to 1.22 by the day of discharge.   Anemia of chronic disease: Hgb chronically low. It was in the 9s in 2013 and 11.7 in 09/2012. Hgb 12.9 on admission. Ferritin low (8) in 2013. Anemia panel showing low iron, low TIBC, and high ferritin. Patient did have hematuria on  his UA from 06/2015 and this admission. He will require close follow-up with urologist for possible cystoscopy.   HTN: Antihypertensives were held on admission and resumed on discharge.   Constipation: He was given laxatives.    Discharge Vitals:   BP 175/69 mmHg  Pulse 66  Temp(Src) 98.4 F (36.9 C) (Oral)  Resp 20  Ht 5' (1.524 m)  Wt 179 lb 0.2 oz (81.2 kg)  BMI 34.96 kg/m2  SpO2 99%  Discharge Labs:  No results found for this or any previous visit (from the past 24 hour(s)).  Signed: Shela Leff, MD 07/25/2015, 7:28 PM    Services Ordered on Discharge:  Equipment Ordered on Discharge:

## 2015-07-23 NOTE — Progress Notes (Signed)
Victor Hunter to be D/C'd Home per MD order.  Discussed with the patient and all questions fully answered.  VSS, Skin clean, dry and intact without evidence of skin break down, no evidence of skin tears noted. IV catheter discontinued intact. Site without signs and symptoms of complications. Dressing and pressure applied.  An After Visit Summary was printed and given to the patient by Alex Gardener. Patient received prescription.  D/c education completed with patient/family including follow up instructions, medication list, d/c activities limitations if indicated, with other d/c instructions as indicated by MD - patient able to verbalize understanding, all questions fully answered.   Patient instructed to return to ED, call 911, or call MD for any changes in condition.   Patient escorted via [place], and D/C home via private auto.  [place], Victor Hunter C 07/23/2015 1:57 PM

## 2015-07-25 LAB — CULTURE, BLOOD (ROUTINE X 2)
CULTURE: NO GROWTH
CULTURE: NO GROWTH

## 2015-07-27 DIAGNOSIS — N401 Enlarged prostate with lower urinary tract symptoms: Secondary | ICD-10-CM | POA: Diagnosis not present

## 2015-07-27 DIAGNOSIS — N452 Orchitis: Secondary | ICD-10-CM | POA: Diagnosis not present

## 2015-07-27 DIAGNOSIS — N390 Urinary tract infection, site not specified: Secondary | ICD-10-CM | POA: Diagnosis not present

## 2015-08-03 ENCOUNTER — Ambulatory Visit (INDEPENDENT_AMBULATORY_CARE_PROVIDER_SITE_OTHER): Payer: Medicare PPO | Admitting: Urology

## 2015-08-03 DIAGNOSIS — R310 Gross hematuria: Secondary | ICD-10-CM | POA: Diagnosis not present

## 2015-08-03 DIAGNOSIS — N390 Urinary tract infection, site not specified: Secondary | ICD-10-CM | POA: Diagnosis not present

## 2015-08-03 DIAGNOSIS — N401 Enlarged prostate with lower urinary tract symptoms: Secondary | ICD-10-CM | POA: Diagnosis not present

## 2015-08-03 DIAGNOSIS — R3129 Other microscopic hematuria: Secondary | ICD-10-CM | POA: Diagnosis not present

## 2015-08-05 ENCOUNTER — Emergency Department (HOSPITAL_COMMUNITY)
Admission: EM | Admit: 2015-08-05 | Discharge: 2015-08-05 | Disposition: A | Discharge status: Home / Self Care | Payer: Medicare PPO | Attending: Emergency Medicine | Admitting: Emergency Medicine

## 2015-08-05 ENCOUNTER — Encounter (HOSPITAL_COMMUNITY): Payer: Self-pay | Admitting: *Deleted

## 2015-08-05 DIAGNOSIS — Z87891 Personal history of nicotine dependence: Secondary | ICD-10-CM | POA: Diagnosis not present

## 2015-08-05 DIAGNOSIS — Z792 Long term (current) use of antibiotics: Secondary | ICD-10-CM | POA: Insufficient documentation

## 2015-08-05 DIAGNOSIS — K219 Gastro-esophageal reflux disease without esophagitis: Secondary | ICD-10-CM | POA: Diagnosis not present

## 2015-08-05 DIAGNOSIS — T83018A Breakdown (mechanical) of other indwelling urethral catheter, initial encounter: Secondary | ICD-10-CM | POA: Diagnosis not present

## 2015-08-05 DIAGNOSIS — Z79899 Other long term (current) drug therapy: Secondary | ICD-10-CM | POA: Insufficient documentation

## 2015-08-05 DIAGNOSIS — N400 Enlarged prostate without lower urinary tract symptoms: Secondary | ICD-10-CM | POA: Diagnosis not present

## 2015-08-05 DIAGNOSIS — N3289 Other specified disorders of bladder: Secondary | ICD-10-CM | POA: Insufficient documentation

## 2015-08-05 DIAGNOSIS — D649 Anemia, unspecified: Secondary | ICD-10-CM | POA: Diagnosis not present

## 2015-08-05 DIAGNOSIS — I10 Essential (primary) hypertension: Secondary | ICD-10-CM | POA: Insufficient documentation

## 2015-08-05 DIAGNOSIS — E079 Disorder of thyroid, unspecified: Secondary | ICD-10-CM | POA: Diagnosis not present

## 2015-08-05 DIAGNOSIS — Y658 Other specified misadventures during surgical and medical care: Secondary | ICD-10-CM | POA: Insufficient documentation

## 2015-08-05 DIAGNOSIS — T83011A Breakdown (mechanical) of indwelling urethral catheter, initial encounter: Secondary | ICD-10-CM

## 2015-08-05 DIAGNOSIS — T83098A Other mechanical complication of other indwelling urethral catheter, initial encounter: Secondary | ICD-10-CM | POA: Diagnosis not present

## 2015-08-05 LAB — URINALYSIS, ROUTINE W REFLEX MICROSCOPIC
Bilirubin Urine: NEGATIVE
GLUCOSE, UA: NEGATIVE mg/dL
Ketones, ur: NEGATIVE mg/dL
Nitrite: NEGATIVE
PH: 6 (ref 5.0–8.0)
Protein, ur: 100 mg/dL — AB
SPECIFIC GRAVITY, URINE: 1.025 (ref 1.005–1.030)

## 2015-08-05 LAB — I-STAT CHEM 8, ED
BUN: 19 mg/dL (ref 6–20)
CHLORIDE: 103 mmol/L (ref 101–111)
CREATININE: 1.4 mg/dL — AB (ref 0.61–1.24)
Calcium, Ion: 1.1 mmol/L — ABNORMAL LOW (ref 1.13–1.30)
Glucose, Bld: 113 mg/dL — ABNORMAL HIGH (ref 65–99)
HEMATOCRIT: 35 % — AB (ref 39.0–52.0)
HEMOGLOBIN: 11.9 g/dL — AB (ref 13.0–17.0)
Potassium: 4.2 mmol/L (ref 3.5–5.1)
SODIUM: 138 mmol/L (ref 135–145)
TCO2: 22 mmol/L (ref 0–100)

## 2015-08-05 LAB — URINE MICROSCOPIC-ADD ON

## 2015-08-05 MED ORDER — FESOTERODINE FUMARATE ER 8 MG PO TB24: 8.0000 mg | ORAL_TABLET | Freq: Every day | ORAL | Status: DC

## 2015-08-05 NOTE — ED Notes (Signed)
Pt has little urine output in bag to send for urinalysis

## 2015-08-05 NOTE — ED Provider Notes (Signed)
CSN: [id number]     Arrival date & time 08/05/15  0906 History   First MD Initiated Contact with Patient 08/05/15 0945     Chief Complaint  Patient presents with  . Urinary Retention      HPI Pt was seen at 0955. Per pt and his family, c/o gradual onset and persistence of constant "foley not draining" that began last night. Pt states he had the foley placed by his Uro MD on 08/03/15 for "an enlarged prostate" and was "started on abx for a urine infection." Denies penile pain, no hematuria, no testicular pain/swelling, no abd pain, no flank pain, no fevers, no rash, no N/V/D.    [facility] Past Medical History  Diagnosis Date  . Hypertension   . GERD (gastroesophageal reflux disease)   . Thyroid disease   . Upper GI bleed   . Ileus ([place])   . Mallory - Weiss tear   . Chronic anemia   . Enlarged prostate     Elevated PSA  . 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 Christoper Fabian benign biopsies February 2005  . History of BPH     s/p TURP 2008  . Blood transfusion without reported diagnosis    Past Surgical History  Procedure Laterality Date  . Thyroidectomy    . Cholecystectomy    . Esophagogastroduodenoscopy  08/31/08    Rourk-possible extrinsic impression on midesophagus, noncritical Schatzki's ring, 3 mm fundal gland polyp, multiple gastric fundic polyps not manipulated, small hiatal hernia : CT chest showed mediastinal mass, eventually underwent thyroidectomy  . Colonoscopy  10/29/03    rourk-inflammatory polyp from 40 cm removed  . Knee arthroscopy      pt denies  . Transurethral resection of prostate      pt denies, but this is listed in his past medical records.   . Laparotomy  04/26/2012    Procedure: EXPLORATORY LAPAROTOMY;  Surgeon: Donato Heinz, MD;  Location: [facility];  Service: General;  Laterality: N/A;  . Central venous catheter insertion  04/26/2012    Procedure: INSERTION CENTRAL LINE  ADULT;  Surgeon: Donato Heinz, MD;  Location: [facility];  Service: General;  Laterality: Left;  started at 0919, ended at 0930,        subclavian  . Esophagogastroduodenoscopy  08/27/2012    Dr. Cristina Gong: moderate sized hiatal hernia, cameron lesions  . Colonoscopy  10/02/2012    Procedure: COLONOSCOPY;  Surgeon: Daneil Dolin, MD;  Location: [facility] SUITE;  Service: Endoscopy;  Laterality: N/A;  1:00   Family History  Problem Relation Age of Onset  . Cancer Other   . Heart failure Other   . Liver disease Sister   . Breast cancer Sister   . Hypertension Mother   . Stroke Father   . Colon cancer Neg Hx    Social History  Substance Use Topics  . Smoking status: Former Smoker -- 0.50 packs/day    Types: Cigarettes    Quit date: 09/10/1992  . Smokeless tobacco: Never Used  . Alcohol Use: No    Review of Systems ROS: Statement: All systems negative except as marked or noted in the HPI; Constitutional: Negative for fever and chills. ; ; Eyes: Negative for eye pain, redness and discharge. ; ; ENMT: Negative for ear pain, hoarseness, nasal congestion, sinus pressure and sore throat. ; ; Cardiovascular: Negative for chest pain, palpitations, diaphoresis, dyspnea and peripheral edema. ; ;  Respiratory: Negative for cough, wheezing and stridor. ; ; Gastrointestinal: Negative for nausea, vomiting, diarrhea, abdominal pain, blood in stool, hematemesis, jaundice and rectal bleeding. . ; ; Genitourinary: +"foley catheter not draining." Negative for dysuria, flank pain and hematuria. ; ; Genital:  No penile drainage or rash, no testicular pain or swelling, no scrotal rash or swelling. ;; Musculoskeletal: Negative for back pain and neck pain. Negative for swelling and trauma.; ; Skin: Negative for pruritus, rash, abrasions, blisters, bruising and skin lesion.; ; Neuro: Negative for headache, lightheadedness and neck stiffness. Negative for weakness, altered level of consciousness , altered mental status,  extremity weakness, paresthesias, involuntary movement, seizure and syncope.     Allergies  Review of patient's allergies indicates no known allergies.  Home Medications   Prior to Admission medications   Medication Sig Start Date End Date Taking? Authorizing Provider  calcium carbonate (TUMS - DOSED IN MG ELEMENTAL CALCIUM) 500 MG chewable tablet Chew 2 tablets by mouth daily as needed. Acid Reflux    Historical Provider, MD  cefUROXime (CEFTIN) 500 MG tablet Take 1 tablet (500 mg total) by mouth 2 (two) times daily with a meal. 07/23/15   Shela Leff, MD  diltiazem (DILACOR XR) 180 MG 24 hr capsule Take 180 mg by mouth daily.    Historical Provider, MD  ferrous sulfate 325 (65 FE) MG tablet Take 1 tablet (325 mg total) by mouth daily with breakfast. 08/30/12   Reyne Dumas, MD  latanoprost (XALATAN) 0.005 % ophthalmic solution Place 1 drop into both eyes every morning.    Historical Provider, MD  levothyroxine (SYNTHROID, LEVOTHROID) 112 MCG tablet Take 112 mcg by mouth daily before breakfast.    Historical Provider, MD  pantoprazole (PROTONIX) 40 MG tablet Take 1 tablet (40 mg total) by mouth 2 (two) times daily before a meal. Patient taking differently: Take 40 mg by mouth daily.  08/30/12   Reyne Dumas, MD  potassium chloride SA (K-DUR,KLOR-CON) 20 MEQ tablet Take 20 mEq by mouth daily.    Historical Provider, MD  senna-docusate (SENOKOT-S) 8.6-50 MG tablet Take 2 tablets by mouth at bedtime as needed for mild constipation. 07/23/15   Shela Leff, MD  Tamsulosin HCl (FLOMAX) 0.4 MG CAPS Take 0.4 mg by mouth daily.  08/06/12   Historical Provider, MD   BP 166/80 mmHg  Pulse 85  Temp(Src) 98.8 F (37.1 C) (Oral)  Resp 16  Ht 5\' 3"  (1.6 m)  Wt 182 lb (82.555 kg)  BMI 32.25 kg/m2  SpO2 98% Physical Exam  1000: Physical examination:  Nursing notes reviewed; Vital signs and O2 SAT reviewed;  Constitutional: Well developed, Well nourished, Well hydrated, In no acute  distress; Head:  Normocephalic, atraumatic; Eyes: EOMI, PERRL, No scleral icterus; ENMT: Mouth and pharynx normal, Mucous membranes moist; Neck: Supple, Full range of motion, No lymphadenopathy; Cardiovascular: Regular rate and rhythm, No gallop; Respiratory: Breath sounds clear & equal bilaterally, No wheezes.  Speaking full sentences with ease, Normal respiratory effort/excursion; Chest: Nontender, Movement normal; Abdomen: Soft, Nontender, Nondistended, Normal bowel sounds; Genitourinary: No CVA tenderness; Extremities: Pulses normal, No tenderness, No edema, No calf edema or asymmetry.; Neuro: AA&Ox3, Major CN grossly intact.  Speech clear. No gross focal motor or sensory deficits in extremities.; Skin: Color normal, Warm, Dry.   ED Course  Procedures (including critical care time)  Labs Review  Imaging Review  I have personally reviewed and evaluated these images and lab results as part of my medical decision-making.   EKG Interpretation None  MDM  MDM Reviewed: previous chart, nursing note and vitals Reviewed previous: labs Interpretation: labs   Results for orders placed or performed during the hospital encounter of 08/05/15  Urinalysis, Routine w reflex microscopic  Result Value Ref Range   Color, Urine YELLOW YELLOW   APPearance CLEAR CLEAR   Specific Gravity, Urine 1.025 1.005 - 1.030   pH 6.0 5.0 - 8.0   Glucose, UA NEGATIVE NEGATIVE mg/dL   Hgb urine dipstick LARGE (A) NEGATIVE   Bilirubin Urine NEGATIVE NEGATIVE   Ketones, ur NEGATIVE NEGATIVE mg/dL   Protein, ur 100 (A) NEGATIVE mg/dL   Nitrite NEGATIVE NEGATIVE   Leukocytes, UA SMALL (A) NEGATIVE  Urine microscopic-add on  Result Value Ref Range   Squamous Epithelial / LPF 0-5 (A) NONE SEEN   WBC, UA 0-5 0 - 5 WBC/hpf   RBC / HPF TOO NUMEROUS TO COUNT 0 - 5 RBC/hpf   Bacteria, UA MANY (A) NONE SEEN  I-stat Chem 8, ED  Result Value Ref Range   Sodium 138 135 - 145 mmol/L   Potassium 4.2 3.5 - 5.1  mmol/L   Chloride 103 101 - 111 mmol/L   BUN 19 6 - 20 mg/dL   Creatinine, Ser 1.40 (H) 0.61 - 1.24 mg/dL   Glucose, Bld 113 (H) 65 - 99 mg/dL   Calcium, Ion 1.10 (L) 1.13 - 1.30 mmol/L   TCO2 22 0 - 100 mmol/L   Hemoglobin 11.9 (L) 13.0 - 17.0 g/dL   HCT 35.0 (L) 39.0 - 52.0 %   Results for XZAYVIER, LENNERT (MRN [id number]) as of 08/05/2015 12:27  Ref. Range 06/19/2015 01:31 07/20/2015 19:19 07/21/2015 05:52 07/22/2015 06:43 07/23/2015 07:12 08/05/2015 10:20  BUN Latest Ref Range: 6-20 mg/dL 17 24 (H) 20 16 16 19   Creatinine Latest Ref Range: 0.61-1.24 mg/dL 1.10 1.79 (H) 1.42 (H) 1.26 (H) 1.22 1.40 (H)     1000:  Foley irrigated with good return. New leg bag placed. Pt continues to state "nothing is draining out of it." Will check bladder scan and I-stat chem.   1200:  No clear UTI on Udip and UC is pending. BUN/Cr near baseline. Bladder scan with 29ml. Pt is urinating into and around the foley catheter. T/C to Uro Dr. Karsten Ro, case discussed, including:  HPI, pertinent PM/SHx, VS/PE, dx testing, ED course and treatment:  States pt likely having bladder spasms, rx toviaz 8mg  PO daily, f/u in office. Pt states he is ready to go home now. Dx and testing, as well as d/w Uro MD, d/w pt and family.  Questions answered.  Verb understanding, agreeable to d/c home with outpt f/u.     Francine Graven, DO 08/08/15 1708

## 2015-08-05 NOTE — ED Notes (Signed)
Pt leaking around catheter tubing. Pts foley flushed to insure that it is draining correctly, good return of flushed fluids. Fluids were noted to be coming out from around pts catheter while flushing. Pt was bearing down like he would normally make urine. Pt instructed this would make the problem worse.

## 2015-08-05 NOTE — ED Notes (Signed)
Urine noted around meatus, urine is draining into bag as well. MD made aware.   3 F catheter is placed in pt currently, largest [facility] has in stock.

## 2015-08-05 NOTE — ED Notes (Signed)
Foley irrigated with return obtained. Pt given new leg bag.

## 2015-08-05 NOTE — Discharge Instructions (Signed)
°Emergency Department Resource Guide °1) Find a Doctor and Pay Out of Pocket °Although you won't have to find out who is covered by your insurance plan, it is a good idea to ask around and get recommendations. You will then need to call the office and see if the doctor you have chosen will accept you as a new patient and what types of options they offer for patients who are self-pay. Some doctors offer discounts or will set up payment plans for their patients who do not have insurance, but you will need to ask so you aren't surprised when you get to your appointment. ° °2) Contact Your Local Health Department °Not all health departments have doctors that can see patients for sick visits, but many do, so it is worth a call to see if yours does. If you don't know where your local health department is, you can check in your phone book. The CDC also has a tool to help you locate your state's health department, and many state websites also have listings of all of their local health departments. ° °3) Find a Walk-in Clinic °If your illness is not likely to be very severe or complicated, you may want to try a walk in clinic. These are popping up all over the country in pharmacies, drugstores, and shopping centers. They're usually staffed by nurse practitioners or physician assistants that have been trained to treat common illnesses and complaints. They're usually fairly quick and inexpensive. However, if you have serious medical issues or chronic medical problems, these are probably not your best option. ° °No Primary Care Doctor: °- Call Health Connect at  832-8000 - they can help you locate a primary care doctor that  accepts your insurance, provides certain services, etc. °- Physician Referral Service- 1-800-533-3463 ° °Chronic Pain Problems: °Organization         Address  Phone   Notes  °[facility]  ([phone]) [phone] Patients need to be referred by their primary care doctor.  ° °Medication  Assistance: °Organization         Address  Phone   Notes  °[place] [facility] [address] °[place], [place] [address] (336) 641-8030 --Must be a resident of [place] °-- Must have NO insurance coverage whatsoever (no Medicaid/ Medicare, etc.) °-- The pt. MUST have a primary care doctor that directs their care regularly and follows them in the community °  °[facility]  (866) 331-1348   °[employer]  (888) 892-1162   ° °Agencies that provide inexpensive medical care: °Organization         Address  Phone   Notes  °[facility]  (336) 832-8035   ° Internal Medicine    (336) 832-7272   °[facility] [address] °[place],  [address] (336) 832-4777   °[facility] [address], °[place] (336) 271-4999   °[facility]    (336) 373-0678   °[facility]    (336) 272-1050   °[facility] ° [address],  Phone:  (336) 832-4444, Fax:  (336) 832-4440 Hours of Operation:  9 am - 6 pm, M-F.  Also accepts Medicaid/Medicare and self-pay.  °[facility] ° [address],  Phone: (336) 832-3150, Fax: (336) [phone]. Hours of Operation:  8:30 am - 5:30 pm, M-F.  Also accepts Medicaid and self-pay.  °[facility] [place] [address], [place] Phone: (336) 878-6027   °[facility] [address], [place], [place] (336)723-1848, Ext. 123 Mondays & Thursdays: 7-9 AM.  First 15 patients are seen on a first come, first serve basis. °  ° °Medicaid-accepting [place] Providers: ° °Organization         Address  Phone   Notes  °[facility] [address], [place] (336) 641-2100 Also accepts self-pay patients.  °[facility] [address], [place] ° (336) 856-9996   °[facility] [address], [place]  (336) 288-8857   °[facility] [address] [place] [address], [place] (336) 299-7000   °Veita Bland [address], [place]  ° (336) 373-1557 Only accepts [place] Access Medicaid patients after they have their name applied to their card.  ° °Self-Pay (no insurance) in [place]: ° °Organization         Address  Phone   Notes  °Sickle Cell Patients, [facility] [address], [place] (336) 832-1970   °[facility] [address], [place] (336) 832-4400   ° [facility] ° [address], [place] (336) 992-4800   °[facility]/Dr. Osei-Bonsu ° [address] [place] [address], [place] or [address], [place] (336) [phone] Phone number for both [place] and [place] locations is the same.  °[facility] [address], [place] (336) 299-0000   °[facility] [address] [place] [address], [place] or [address] (336) 852-7530 °(336) 878-2260   °[facility] [address], [place] (336) 350-1642, phone; (336) 294-5005, fax Sees patients 1st and 3rd Saturday of every month.  Must not qualify for public or private insurance (i.e. Medicaid, Medicare, [place] Health Choice, Veterans' Benefits) • Household income should be no more than 200% of the poverty level •The clinic cannot treat you if you are pregnant or think you are pregnant • Sexually transmitted diseases are not treated at the clinic.  ° ° °Dental Care: °Organization         Address  Phone  Notes  °[place] [facility] [address], [place] (336) 641-6152 Accepts children up to age 21 who are enrolled in Medicaid or [place] Health Choice; pregnant women with a Medicaid card; and children who have applied for Medicaid or [place] Health Choice, but were declined, whose parents can pay a reduced fee at time of service.  °[place]  [facility] [place]  [address], [place] (336) 641-7733 Accepts children up to age 21 who are enrolled in Medicaid or [place] Health Choice; pregnant women with a Medicaid card; and children who have applied for Medicaid or  Health Choice, but were declined, whose parents can pay a reduced fee at time of service.  °[facility] ° [address], [place] (336) 641-4533 Patients are seen by appointment only. Walk-ins are not accepted. [facility] will see patients 18 years of age and older. °Monday - Tuesday (8am-5pm) °Most Wednesdays (8:30-5pm) °$30 per visit, cash only  °[facility] ° [address], [place] (336) 641-4533 Patients are seen by appointment only. Walk-ins are not accepted. [facility] will see patients 18 years of age and older. °One   Wednesday Evening (Monthly: Volunteer Based).  $30 per visit, cash only  °[facility]  (919) 537-3737 for adults; Children under age 4, call Graduate Pediatric Dentistry at (919) [phone]. Children aged 4-14, please call (919) [phone] to request a pediatric application. ° Dental services are provided in all areas of dental care including fillings, crowns and bridges, complete and partial dentures, implants, gum treatment, root canals, and extractions. Preventive care is also provided. Treatment is provided to both adults and children. °Patients are selected via a lottery and there is often a waiting list. °  °[facility] [address], °[place] ° (336) 763-8833 [email or web address].[email or web address] °  °[facility] [address], [place], [place] (336)723-1848, Ext. 123 Second and Fourth Thursday of each month, opens at 6:30 AM; Clinic ends at 9 AM.  Patients are seen on a first-come first-served basis, and a limited number are seen during each clinic.  ° °[facility] ° [address], [place], [place] (336) 723-7904    Eligibility Requirements °You must have lived in [place], [place], or [place] counties for at least the last three months. °  You cannot be eligible for state or federal sponsored healthcare insurance, including Veterans Administration, Medicaid, or Medicare. °  You generally cannot be eligible for healthcare insurance through your employer.  °  How to apply: °Eligibility screenings are held every Tuesday and Wednesday afternoon from 1:00 pm until 4:00 pm. You do not need an appointment for the interview!  °[facility] [address], [place], [place] 336-631-2330   °[facility]  336-342-8273   °[place]  336-703-3100   °[facility]  336-570-6415   ° °Behavioral Health Resources in the Community: °Intensive Outpatient Programs °Organization         Address  Phone  Notes  °[facility] [address], [place], [place] 336-878-6098   °[facility] [address], [place], [place] 336-832-9800   °[facility] [address], [place], [place] ° 336-882-2125   °[facility] [address],  °[place], [place] 1-800-853-5163 or 336-641-4981   °Substance Abuse Resources °Organization         Address  Phone  Notes  °[facility]  336-882-2125   °[facility]  336-784-9470   °[facility] [place] [facility]  336-285-9073   °[facility]  336-845-3988   °Residential & Outpatient Substance Abuse Program  1-800-659-3381   °Psychological Services °Organization         Address  Phone  Notes  °[facility]  336- 832-9600   °[facility]  336- 378-7881   °[facility] [address], [place] 1-800-853-5163 or 336-641-4981   ° °Mobile Crisis Teams °Organization         Address  Phone  Notes  °[facility], Mobile Crisis Care Unit  1-877-626-1772   °Assertive °[facility] ° [address]  [place], [place] 336-834-9664   °Sharon DeEsch [address] °[place] 336-554-5454   ° °Self-Help/Support Groups °Organization         Address  Phone             Notes  °[facility] [place] - variety of support groups  336- 373-1402 Call for more information  °Narcotics Anonymous (NA), [facility] [address], °[place]  2 meetings at this location  ° °  Residential Treatment Programs Organization         Address  Phone  Notes  [facility] [address],    [place]  1-812-693-3146   [facility]  [address] [place] [id number], [place], [place]   [place], [place] 646-275-2455 Admissions: 8am-3pm M-F  [facility] [place] [address]    [place], [place] [id number]   [facility] [address] [place], [place], [place]   [facility] [address],  [place], [place]   [facility] Intensive Outpatient [address], [place], [place]   [facility] ([place]  [place], [place] 1-6142620860 or 6393093080   [facility] [address], [place], [place] Accepts Medicaid  [facility] [place] [address]  [place] 1-6065045401 Substance Abuse/Addiction Treatment   [facility]         Address  Phone  Notes  [facility]  [phone]   Domenic Schwab, PhD [address] [place], [place]   220-112-8982 or (478)339-0300   [place], [place] (862)855-0532   [facility] [address] [place], [place] [phone] Insurance/Medicaid/sponsorship through [facility] [address]                                    [place], [place] [phone] [place] [address], [place] [phone]    Dr. Adele Schilder  [phone]   [facility] Dept. 1) [address], [place] 2) [place] 3)  [address], [place] [phone]  [phone]   [place] (770) [phone] or [phone] (After Hours)      Take the prescription as directed.  Call your regular Urologist today to schedule a follow up appointment within the next 2 to 3 days.  Return to the Emergency Department immediately sooner if worsening.

## 2015-08-05 NOTE — ED Notes (Signed)
Pt got a catheter placed on 11/29 for short term use until prostate was smaller. Pt began having little urinary output starting yesterday with no output today. Pt has little discomfort at this time

## 2015-08-06 LAB — URINE CULTURE

## 2015-08-10 ENCOUNTER — Ambulatory Visit (INDEPENDENT_AMBULATORY_CARE_PROVIDER_SITE_OTHER): Payer: Medicare PPO | Admitting: Urology

## 2015-08-10 DIAGNOSIS — N390 Urinary tract infection, site not specified: Secondary | ICD-10-CM

## 2015-08-10 DIAGNOSIS — N401 Enlarged prostate with lower urinary tract symptoms: Secondary | ICD-10-CM

## 2015-08-10 DIAGNOSIS — R310 Gross hematuria: Secondary | ICD-10-CM

## 2015-08-17 DIAGNOSIS — Z6830 Body mass index (BMI) 30.0-30.9, adult: Secondary | ICD-10-CM | POA: Diagnosis not present

## 2015-08-17 DIAGNOSIS — E890 Postprocedural hypothyroidism: Secondary | ICD-10-CM | POA: Diagnosis not present

## 2015-08-17 DIAGNOSIS — N401 Enlarged prostate with lower urinary tract symptoms: Secondary | ICD-10-CM | POA: Diagnosis not present

## 2015-08-17 DIAGNOSIS — I10 Essential (primary) hypertension: Secondary | ICD-10-CM | POA: Diagnosis not present

## 2015-10-19 DIAGNOSIS — H401131 Primary open-angle glaucoma, bilateral, mild stage: Secondary | ICD-10-CM | POA: Diagnosis not present

## 2015-11-09 ENCOUNTER — Ambulatory Visit (INDEPENDENT_AMBULATORY_CARE_PROVIDER_SITE_OTHER): Payer: Medicare PPO | Admitting: Urology

## 2015-11-09 DIAGNOSIS — N401 Enlarged prostate with lower urinary tract symptoms: Secondary | ICD-10-CM

## 2015-11-09 DIAGNOSIS — R310 Gross hematuria: Secondary | ICD-10-CM | POA: Diagnosis not present

## 2015-11-23 DIAGNOSIS — I10 Essential (primary) hypertension: Secondary | ICD-10-CM | POA: Diagnosis not present

## 2015-11-23 DIAGNOSIS — N5201 Erectile dysfunction due to arterial insufficiency: Secondary | ICD-10-CM | POA: Diagnosis not present

## 2015-11-23 DIAGNOSIS — E039 Hypothyroidism, unspecified: Secondary | ICD-10-CM | POA: Diagnosis not present

## 2015-11-23 DIAGNOSIS — N429 Disorder of prostate, unspecified: Secondary | ICD-10-CM | POA: Diagnosis not present

## 2016-01-18 DIAGNOSIS — H401131 Primary open-angle glaucoma, bilateral, mild stage: Secondary | ICD-10-CM | POA: Diagnosis not present

## 2016-02-22 DIAGNOSIS — N429 Disorder of prostate, unspecified: Secondary | ICD-10-CM | POA: Diagnosis not present

## 2016-02-22 DIAGNOSIS — Z6831 Body mass index (BMI) 31.0-31.9, adult: Secondary | ICD-10-CM | POA: Diagnosis not present

## 2016-02-22 DIAGNOSIS — I10 Essential (primary) hypertension: Secondary | ICD-10-CM | POA: Diagnosis not present

## 2016-02-22 DIAGNOSIS — E039 Hypothyroidism, unspecified: Secondary | ICD-10-CM | POA: Diagnosis not present

## 2016-04-18 DIAGNOSIS — H401131 Primary open-angle glaucoma, bilateral, mild stage: Secondary | ICD-10-CM | POA: Diagnosis not present

## 2016-04-18 DIAGNOSIS — H25013 Cortical age-related cataract, bilateral: Secondary | ICD-10-CM | POA: Diagnosis not present

## 2016-04-18 DIAGNOSIS — H2513 Age-related nuclear cataract, bilateral: Secondary | ICD-10-CM | POA: Diagnosis not present

## 2016-05-25 DIAGNOSIS — E785 Hyperlipidemia, unspecified: Secondary | ICD-10-CM | POA: Diagnosis not present

## 2016-05-25 DIAGNOSIS — E039 Hypothyroidism, unspecified: Secondary | ICD-10-CM | POA: Diagnosis not present

## 2016-05-25 DIAGNOSIS — I10 Essential (primary) hypertension: Secondary | ICD-10-CM | POA: Diagnosis not present

## 2016-05-25 DIAGNOSIS — Z23 Encounter for immunization: Secondary | ICD-10-CM | POA: Diagnosis not present

## 2016-05-25 DIAGNOSIS — Z6832 Body mass index (BMI) 32.0-32.9, adult: Secondary | ICD-10-CM | POA: Diagnosis not present

## 2016-05-25 DIAGNOSIS — N401 Enlarged prostate with lower urinary tract symptoms: Secondary | ICD-10-CM | POA: Diagnosis not present

## 2016-07-24 DIAGNOSIS — H401131 Primary open-angle glaucoma, bilateral, mild stage: Secondary | ICD-10-CM | POA: Diagnosis not present

## 2016-08-22 DIAGNOSIS — N429 Disorder of prostate, unspecified: Secondary | ICD-10-CM | POA: Diagnosis not present

## 2016-08-22 DIAGNOSIS — E039 Hypothyroidism, unspecified: Secondary | ICD-10-CM | POA: Diagnosis not present

## 2016-08-22 DIAGNOSIS — I10 Essential (primary) hypertension: Secondary | ICD-10-CM | POA: Diagnosis not present

## 2016-08-22 DIAGNOSIS — N5201 Erectile dysfunction due to arterial insufficiency: Secondary | ICD-10-CM | POA: Diagnosis not present

## 2016-10-24 DIAGNOSIS — H401131 Primary open-angle glaucoma, bilateral, mild stage: Secondary | ICD-10-CM | POA: Diagnosis not present

## 2016-11-21 DIAGNOSIS — E039 Hypothyroidism, unspecified: Secondary | ICD-10-CM | POA: Diagnosis not present

## 2016-11-21 DIAGNOSIS — N429 Disorder of prostate, unspecified: Secondary | ICD-10-CM | POA: Diagnosis not present

## 2016-11-21 DIAGNOSIS — I10 Essential (primary) hypertension: Secondary | ICD-10-CM | POA: Diagnosis not present

## 2016-11-21 DIAGNOSIS — Z6832 Body mass index (BMI) 32.0-32.9, adult: Secondary | ICD-10-CM | POA: Diagnosis not present

## 2016-12-05 ENCOUNTER — Ambulatory Visit (INDEPENDENT_AMBULATORY_CARE_PROVIDER_SITE_OTHER): Payer: Medicare PPO | Admitting: Urology

## 2016-12-05 DIAGNOSIS — N401 Enlarged prostate with lower urinary tract symptoms: Secondary | ICD-10-CM

## 2016-12-05 DIAGNOSIS — R310 Gross hematuria: Secondary | ICD-10-CM

## 2017-01-17 ENCOUNTER — Ambulatory Visit (INDEPENDENT_AMBULATORY_CARE_PROVIDER_SITE_OTHER): Payer: Medicare PPO | Admitting: Gastroenterology

## 2017-01-17 ENCOUNTER — Encounter: Payer: Self-pay | Admitting: Gastroenterology

## 2017-01-17 DIAGNOSIS — K439 Ventral hernia without obstruction or gangrene: Secondary | ICD-10-CM

## 2017-01-17 NOTE — Assessment & Plan Note (Signed)
83 year old male with known ventral hernia, presenting today at request of his daughters for further evaluation. He is completely asymptomatic and doing quite well from a GI standpoint. He denies any pain, bloating, nausea, vomiting, and we discussed at length supportive measures and signs/symptoms that would necessitate urgent evaluation. I discussed I could order another CT (last on file in 2016), but I did not see a reason for this when he has no concerning features. He also would not likely be an ideal surgical candidate, so imaging at this point will be on hold unless he has concerning symptoms. Will have him return as needed.

## 2017-01-17 NOTE — Patient Instructions (Addendum)
You have an incisional hernia. This isn't causing any problems, so we will monitor for now. If you have severe abdominal pain, bloated abdomen, bulging that does not go back in, nausea, vomiting, difficulty eating, weight loss, call us right away.   We will see you back as needed!  I have attached a handout on hernias.  Ventral Hernia A ventral hernia is a bulge of tissue from inside the abdomen that pushes through a weak area of the muscles that form the front wall of the abdomen. The tissues inside the abdomen are inside a sac (peritoneum). These tissues include the small intestine, large intestine, and the fatty tissue that covers the intestines (omentum). Sometimes, the bulge that forms a hernia contains intestines. Other hernias contain only fat. Ventral hernias do not go away without surgical treatment. There are several types of ventral hernias. You may have:  A hernia at an incision site from previous abdominal surgery (incisional hernia).  A hernia just above the belly button (epigastric hernia), or at the belly button (umbilical hernia). These types of hernias can develop from heavy lifting or straining.  A hernia that comes and goes (reducible hernia). It may be visible only when you lift or strain. This type of hernia can be pushed back into the abdomen (reduced).  A hernia that traps abdominal tissue inside the hernia (incarcerated hernia). This type of hernia does not reduce.  A hernia that cuts off blood flow to the tissues inside the hernia (strangulated hernia). The tissues can start to die if this happens. This is a very painful bulge that cannot be reduced. A strangulated hernia is a medical emergency. What are the causes? This condition is caused by abdominal tissue putting pressure on an area of weakness in the abdominal muscles. What increases the risk? The following factors may make you more likely to develop this condition:  Being male.  Being 42 or older.  Being  overweight or obese.  Having had previous abdominal surgery, especially if there was an infection after surgery.  Having had an injury to the abdominal wall.  Having had several pregnancies.  Having a buildup of fluid inside the abdomen (ascites). What are the signs or symptoms? The only symptom of a ventral hernia may be a painless bulge in the abdomen. A reducible hernia may be visible only when you strain, cough, or lift. Other symptoms may include:  Dull pain.  A feeling of pressure. Signs and symptoms of a strangulated hernia may include:  Increasing pain.  Nausea and vomiting.  Pain when pressing on the hernia.  The skin over the hernia turning red or purple.  Constipation.  Blood in the stool (feces). How is this diagnosed? This condition may be diagnosed based on:  Your symptoms.  Your medical history.  A physical exam. You may be asked to cough or strain while standing. These actions increase the pressure inside your abdomen and force the hernia through the opening in your muscles. Your health care provider may try to reduce the hernia by pressing on it.  Imaging studies, such as an ultrasound or CT scan. How is this treated? This condition is treated with surgery. If you have a strangulated hernia, surgery is done as soon as possible. If your hernia is small and not incarcerated, you may be asked to lose some weight before surgery. Follow these instructions at home:  Follow instructions from your health care provider about eating or drinking restrictions.  If you are overweight, your health  care provider may recommend that you increase your activity level and eat a healthier diet.  Do not lift anything that is heavier than 10 lb (4.5 kg).  Return to your normal activities as told by your health care provider. Ask your health care provider what activities are safe for you. You may need to avoid activities that increase pressure on your hernia.  Take  over-the-counter and prescription medicines only as told by your health care provider.  Keep all follow-up visits as told by your health care provider. This is important. Contact a health care provider if:  Your hernia gets larger.  Your hernia becomes painful. Get help right away if:  Your hernia becomes increasingly painful.  You have pain along with any of the following:  Changes in skin color in the area of the hernia.  Nausea.  Vomiting.  Fever. Summary  A ventral hernia is a bulge of tissue from inside the abdomen that pushes through a weak area of the muscles that form the front wall of the abdomen.  This condition is treated with surgery, which may be urgent depending on your hernia.  Do not lift anything that is heavier than 10 lb (4.5 kg), and follow activity instructions from your health care provider. This information is not intended to replace advice given to you by your health care provider. Make sure you discuss any questions you have with your health care provider. Document Released: 08/07/2012 Document Revised: 04/07/2016 Document Reviewed: 04/07/2016 Elsevier Interactive Patient Education  2017 [employer].

## 2017-01-17 NOTE — Progress Notes (Signed)
Primary Care Physician:  Rosita Fire, MD Primary Gastroenterologist:  Dr. Gala Romney   Chief Complaint  Patient presents with  . Hernia    HPI:   Victor Hunter is a 83 y.o. male presenting today as a self-referral for concerns regarding an abdominal protrusion. He was last seen in 2014. History significant for possible small bowel obstruction in Aug 2013 s/p exploratory laparotomy. EGD in 2013 by Dr. Cristina Gong due to coffee-ground emesis and transfusion-dependent anemia noted Cameron lesions, no active bleeding. He then had a colonoscopy in early 2014 that was normal. We have not seen him since that time.    No pain. No N/V. No rectal bleeding. Good appetite. No unexplained weight loss. Hernia does not bother him. His daughters noted this and wanted further evaluation. CT abdomen/pelvis without contrast Nov 2016 with small supraumbilical ventral abdominal wall hernia without obstruction or wall thickening, containing anti-mesenteric border of transverse colon.   Past Medical History:  Diagnosis Date  . Blood transfusion without reported diagnosis   . Chronic anemia   . Enlarged prostate    Elevated PSA  . Esophagitis    High grade squamous dysplasia occurring in the background of chronic active esophagitis,, EGD Dr. Christoper Fabian benign biopsies February 2005  . GERD (gastroesophageal reflux disease)   . History of BPH    s/p TURP 2008  . Hypertension   . Ileus ([place])   . Mallory - Weiss tear   . Thyroid disease   . Upper GI bleed     Past Surgical History:  Procedure Laterality Date  . CENTRAL VENOUS CATHETER INSERTION  04/26/2012   Procedure: INSERTION CENTRAL LINE ADULT;  Surgeon: Donato Heinz, MD;  Location: [facility];  Service: General;  Laterality: Left;  started at 0919, ended at 0930,        subclavian  . CHOLECYSTECTOMY    . COLONOSCOPY  10/29/03   rourk-inflammatory polyp from 40 cm removed  . COLONOSCOPY  10/02/2012   Dr. Gala Romney: normal  . ESOPHAGOGASTRODUODENOSCOPY   08/31/08   Rourk-possible extrinsic impression on midesophagus, noncritical Schatzki's ring, 3 mm fundal gland polyp, multiple gastric fundic polyps not manipulated, small hiatal hernia : CT chest showed mediastinal mass, eventually underwent thyroidectomy  . ESOPHAGOGASTRODUODENOSCOPY  08/27/2012   Dr. Cristina Gong: moderate sized hiatal hernia, cameron lesions  . LAPAROTOMY  04/26/2012   Procedure: EXPLORATORY LAPAROTOMY;  Surgeon: Donato Heinz, MD;  Location: [facility];  Service: General;  Laterality: N/A;  . THYROIDECTOMY    . TRANSURETHRAL RESECTION OF PROSTATE     pt denies, but this is listed in his past medical records.     Current Outpatient Prescriptions  Medication Sig Dispense Refill  . diltiazem (DILACOR XR) 180 MG 24 hr capsule Take 180 mg by mouth daily.    . finasteride (PROSCAR) 5 MG tablet TK 1 T PO D UTD  3  . levothyroxine (SYNTHROID, LEVOTHROID) 112 MCG tablet Take 112 mcg by mouth daily before breakfast.    . lisinopril (PRINIVIL,ZESTRIL) 10 MG tablet Take 10 mg by mouth daily.    . pantoprazole (PROTONIX) 40 MG tablet Take 1 tablet (40 mg total) by mouth 2 (two) times daily before a meal. (Patient taking differently: Take 40 mg by mouth daily. ) 60 tablet 2  . Tamsulosin HCl (FLOMAX) 0.4 MG CAPS Take 0.4 mg by mouth daily.      No current facility-administered medications for this visit.     Allergies as of 01/17/2017  . (  No Known Allergies)    Family History  Problem Relation Age of Onset  . Cancer Other   . Heart failure Other   . Liver disease Sister   . Breast cancer Sister   . Hypertension Mother   . Stroke Father   . Colon cancer Neg Hx     Social History   Social History  . Marital status: Married    Spouse name: N/A  . Number of children: 4  . Years of education: N/A   Occupational History  . retired truck Engineer, structural   Social History Main Topics  . Smoking status: Former Smoker    Packs/day: 0.50    Types: Cigarettes     Quit date: 09/10/1992  . Smokeless tobacco: Never Used  . Alcohol use No  . Drug use: No  . Sexual activity: No   Other Topics Concern  . Not on file   Social History Narrative  . No narrative on file    Review of Systems: Gen: Denies any fever, chills, fatigue, weight loss, lack of appetite.  CV: Denies chest pain, heart palpitations, peripheral edema, syncope.  Resp: Denies shortness of breath at rest or with exertion. Denies wheezing or cough.  GI: see HPI  GU : Denies urinary burning, urinary frequency, urinary hesitancy MS: Denies joint pain, muscle weakness, cramps, or limitation of movement.  Derm: Denies rash, itching, dry skin Psych: Denies depression, anxiety, memory loss, and confusion Heme: Denies bruising, bleeding, and enlarged lymph nodes.  Physical Exam: BP 125/72   Pulse (!) 54   Temp 97.5 F (36.4 C) (Oral)   Ht 5\' 3"  (1.6 m)   Wt 183 lb (83 kg)   BMI 32.42 kg/m  General:   Alert and oriented. Pleasant and cooperative. Well-nourished and well-developed.  Head:  Normocephalic and atraumatic. Eyes:  Without icterus, sclera clear and conjunctiva pink.  Ears:  Normal auditory acuity. Nose:  No deformity, discharge,  or lesions. Mouth:  No deformity or lesions, oral mucosa pink.  Lungs:  Clear to auscultation bilaterally. No wheezes, rales, or rhonchi. No distress.  Heart:  S1, S2 present with possible systolic murmur  Abdomen:  +BS, soft, non-tender and non-distended. Supra-umbilical ventral hernia noted with valsalva maneuver, easily reducible  Rectal:  Deferred  Msk:  Symmetrical without gross deformities. Slight kyphosis  Extremities:  Without edema. Neurologic:  Alert and  oriented x4 Psych:  Alert and cooperative. Normal mood and affect.

## 2017-01-17 NOTE — Progress Notes (Signed)
cc'ed to pcp °

## 2017-01-23 DIAGNOSIS — H401131 Primary open-angle glaucoma, bilateral, mild stage: Secondary | ICD-10-CM | POA: Diagnosis not present

## 2017-02-01 ENCOUNTER — Emergency Department (HOSPITAL_COMMUNITY)
Admission: EM | Admit: 2017-02-01 | Discharge: 2017-02-01 | Disposition: A | Discharge status: Home / Self Care | Payer: Medicare PPO | Attending: Emergency Medicine | Admitting: Emergency Medicine

## 2017-02-01 ENCOUNTER — Encounter (HOSPITAL_COMMUNITY): Payer: Self-pay

## 2017-02-01 ENCOUNTER — Emergency Department (HOSPITAL_COMMUNITY): Payer: Medicare PPO

## 2017-02-01 DIAGNOSIS — Z87891 Personal history of nicotine dependence: Secondary | ICD-10-CM | POA: Diagnosis not present

## 2017-02-01 DIAGNOSIS — Y92410 Unspecified street and highway as the place of occurrence of the external cause: Secondary | ICD-10-CM | POA: Insufficient documentation

## 2017-02-01 DIAGNOSIS — I10 Essential (primary) hypertension: Secondary | ICD-10-CM | POA: Insufficient documentation

## 2017-02-01 DIAGNOSIS — Z79899 Other long term (current) drug therapy: Secondary | ICD-10-CM | POA: Diagnosis not present

## 2017-02-01 DIAGNOSIS — S8991XA Unspecified injury of right lower leg, initial encounter: Secondary | ICD-10-CM | POA: Diagnosis present

## 2017-02-01 DIAGNOSIS — Y999 Unspecified external cause status: Secondary | ICD-10-CM | POA: Diagnosis not present

## 2017-02-01 DIAGNOSIS — S82464A Nondisplaced segmental fracture of shaft of right fibula, initial encounter for closed fracture: Secondary | ICD-10-CM | POA: Insufficient documentation

## 2017-02-01 DIAGNOSIS — S79912A Unspecified injury of left hip, initial encounter: Secondary | ICD-10-CM | POA: Diagnosis not present

## 2017-02-01 DIAGNOSIS — E039 Hypothyroidism, unspecified: Secondary | ICD-10-CM | POA: Diagnosis not present

## 2017-02-01 DIAGNOSIS — Y939 Activity, unspecified: Secondary | ICD-10-CM | POA: Insufficient documentation

## 2017-02-01 DIAGNOSIS — M25561 Pain in right knee: Secondary | ICD-10-CM | POA: Diagnosis not present

## 2017-02-01 DIAGNOSIS — S82454A Nondisplaced comminuted fracture of shaft of right fibula, initial encounter for closed fracture: Secondary | ICD-10-CM | POA: Diagnosis not present

## 2017-02-01 DIAGNOSIS — S82831A Other fracture of upper and lower end of right fibula, initial encounter for closed fracture: Secondary | ICD-10-CM | POA: Diagnosis not present

## 2017-02-01 MED ORDER — HYDROCODONE-ACETAMINOPHEN 5-325 MG PO TABS
1.0000 | ORAL_TABLET | Freq: Once | ORAL | Status: AC
  Administered 2017-02-01: 1 via ORAL
  Filled 2017-02-01: qty 1

## 2017-02-01 MED ORDER — HYDROCODONE-ACETAMINOPHEN 5-325 MG PO TABS: 1.0000 | ORAL_TABLET | Freq: Four times a day (QID) | ORAL | 0 refills | Status: DC | PRN

## 2017-02-01 NOTE — ED Notes (Signed)
Documentation charted at 2233 charted on wrong patient.
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
## 2017-02-01 NOTE — ED Notes (Signed)
PAtient requesting something for pain prior to going home. Spoke to Dr. Thurnell Garbe and state to give patient 1 norco now.

## 2017-02-05 ENCOUNTER — Ambulatory Visit (INDEPENDENT_AMBULATORY_CARE_PROVIDER_SITE_OTHER): Payer: Medicare PPO | Admitting: Orthopedic Surgery

## 2017-02-05 ENCOUNTER — Ambulatory Visit (INDEPENDENT_AMBULATORY_CARE_PROVIDER_SITE_OTHER): Payer: Medicare PPO

## 2017-02-05 ENCOUNTER — Other Ambulatory Visit: Payer: Self-pay | Admitting: Orthopedic Surgery

## 2017-02-05 VITALS — BP 133/79 | HR 83 | Temp 98.1°F

## 2017-02-05 DIAGNOSIS — M25561 Pain in right knee: Secondary | ICD-10-CM

## 2017-02-05 DIAGNOSIS — S82141A Displaced bicondylar fracture of right tibia, initial encounter for closed fracture: Secondary | ICD-10-CM
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
## 2017-02-05 NOTE — Patient Instructions (Signed)
Wear brace when walking  You can apply partial but not full weight on the right leg

## 2017-02-12 ENCOUNTER — Other Ambulatory Visit: Payer: Self-pay | Admitting: Orthopedic Surgery

## 2017-02-12 ENCOUNTER — Telehealth: Payer: Self-pay | Admitting: Orthopedic Surgery

## 2017-02-12 MED ORDER — HYDROCODONE-ACETAMINOPHEN 5-325 MG PO TABS: 1.0000 | ORAL_TABLET | Freq: Four times a day (QID) | ORAL | 0 refills | Status: AC | PRN

## 2017-02-12 NOTE — Telephone Encounter (Signed)
Pt requests Hydrocodone/Acetaminophen 5-325  Mgs.      Sig: Take 1 tablet by mouth every 6 (six) hours as needed for severe pain.

## 2017-02-15 ENCOUNTER — Encounter: Payer: Self-pay | Admitting: Orthopedic Surgery

## 2017-02-19 ENCOUNTER — Encounter: Payer: Self-pay | Admitting: Orthopedic Surgery

## 2017-02-19 ENCOUNTER — Ambulatory Visit (INDEPENDENT_AMBULATORY_CARE_PROVIDER_SITE_OTHER): Payer: Self-pay | Admitting: Orthopedic Surgery

## 2017-02-19 ENCOUNTER — Ambulatory Visit (INDEPENDENT_AMBULATORY_CARE_PROVIDER_SITE_OTHER): Payer: Medicare PPO

## 2017-02-19 DIAGNOSIS — S82141D Displaced bicondylar fracture of right tibia, subsequent encounter for closed fracture with routine healing: Secondary | ICD-10-CM
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
## 2017-02-20 DIAGNOSIS — E039 Hypothyroidism, unspecified: Secondary | ICD-10-CM | POA: Diagnosis not present

## 2017-02-20 DIAGNOSIS — S8392XD Sprain of unspecified site of left knee, subsequent encounter: Secondary | ICD-10-CM | POA: Diagnosis not present

## 2017-02-20 DIAGNOSIS — I10 Essential (primary) hypertension: Secondary | ICD-10-CM | POA: Diagnosis not present

## 2017-03-08 ENCOUNTER — Encounter: Payer: Self-pay | Admitting: Orthopedic Surgery

## 2017-03-19 ENCOUNTER — Encounter: Payer: Self-pay | Admitting: Orthopedic Surgery

## 2017-03-19 ENCOUNTER — Ambulatory Visit (INDEPENDENT_AMBULATORY_CARE_PROVIDER_SITE_OTHER): Payer: Self-pay | Admitting: Orthopedic Surgery

## 2017-03-19 ENCOUNTER — Ambulatory Visit (INDEPENDENT_AMBULATORY_CARE_PROVIDER_SITE_OTHER): Payer: Medicare PPO

## 2017-03-19 DIAGNOSIS — S82141D Displaced bicondylar fracture of right tibia, subsequent encounter for closed fracture with routine healing: Secondary | ICD-10-CM

## 2017-03-19 NOTE — Progress Notes (Signed)
Fracture care follow-up  Right tibial plateau fracture  Chief Complaint  Patient presents with  . Follow-up    4 week recheck on righ tibial plateau fracture, DOI 02-02-27.    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

## 2017-03-19 NOTE — Patient Instructions (Signed)
Walker and brace x 1 week   Then  Cane left hand with brace until I see you Aug 6

## 2017-04-09 ENCOUNTER — Ambulatory Visit (INDEPENDENT_AMBULATORY_CARE_PROVIDER_SITE_OTHER): Payer: Medicare PPO

## 2017-04-09 ENCOUNTER — Ambulatory Visit (INDEPENDENT_AMBULATORY_CARE_PROVIDER_SITE_OTHER): Payer: Medicare PPO | Admitting: Orthopedic Surgery

## 2017-04-09 ENCOUNTER — Encounter: Payer: Self-pay | Admitting: Orthopedic Surgery

## 2017-04-09 DIAGNOSIS — S82141D Displaced bicondylar fracture of right tibia, subsequent encounter for closed fracture with routine healing: Secondary | ICD-10-CM | POA: Diagnosis not present
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
## 2017-04-09 NOTE — Patient Instructions (Signed)
Return to work note for Monday, August 13

## 2017-04-24 DIAGNOSIS — H2513 Age-related nuclear cataract, bilateral: Secondary | ICD-10-CM | POA: Diagnosis not present

## 2017-04-24 DIAGNOSIS — H401131 Primary open-angle glaucoma, bilateral, mild stage: Secondary | ICD-10-CM | POA: Diagnosis not present

## 2017-04-24 DIAGNOSIS — H25013 Cortical age-related cataract, bilateral: Secondary | ICD-10-CM | POA: Diagnosis not present

## 2017-05-24 DIAGNOSIS — E785 Hyperlipidemia, unspecified: Secondary | ICD-10-CM | POA: Diagnosis not present

## 2017-05-24 DIAGNOSIS — E669 Obesity, unspecified: Secondary | ICD-10-CM | POA: Diagnosis not present

## 2017-05-24 DIAGNOSIS — Z0000 Encounter for general adult medical examination without abnormal findings: Secondary | ICD-10-CM | POA: Diagnosis not present

## 2017-05-24 DIAGNOSIS — E039 Hypothyroidism, unspecified: Secondary | ICD-10-CM | POA: Diagnosis not present

## 2017-05-24 DIAGNOSIS — Z23 Encounter for immunization: Secondary | ICD-10-CM | POA: Diagnosis not present

## 2017-05-24 DIAGNOSIS — N401 Enlarged prostate with lower urinary tract symptoms: Secondary | ICD-10-CM | POA: Diagnosis not present

## 2017-05-24 DIAGNOSIS — I10 Essential (primary) hypertension: Secondary | ICD-10-CM | POA: Diagnosis not present

## 2017-07-12 DIAGNOSIS — K219 Gastro-esophageal reflux disease without esophagitis: Secondary | ICD-10-CM | POA: Diagnosis not present

## 2017-07-12 DIAGNOSIS — E039 Hypothyroidism, unspecified: Secondary | ICD-10-CM | POA: Diagnosis not present

## 2017-07-12 DIAGNOSIS — M1990 Unspecified osteoarthritis, unspecified site: Secondary | ICD-10-CM | POA: Diagnosis not present

## 2017-07-12 DIAGNOSIS — N400 Enlarged prostate without lower urinary tract symptoms: Secondary | ICD-10-CM | POA: Diagnosis not present

## 2017-07-12 DIAGNOSIS — H409 Unspecified glaucoma: Secondary | ICD-10-CM | POA: Diagnosis not present

## 2017-07-12 DIAGNOSIS — I10 Essential (primary) hypertension: Secondary | ICD-10-CM | POA: Diagnosis not present

## 2017-08-02 DIAGNOSIS — H401131 Primary open-angle glaucoma, bilateral, mild stage: Secondary | ICD-10-CM | POA: Diagnosis not present

## 2017-09-27 DIAGNOSIS — N5201 Erectile dysfunction due to arterial insufficiency: Secondary | ICD-10-CM | POA: Diagnosis not present

## 2017-09-27 DIAGNOSIS — N401 Enlarged prostate with lower urinary tract symptoms: Secondary | ICD-10-CM | POA: Diagnosis not present

## 2017-09-27 DIAGNOSIS — I10 Essential (primary) hypertension: Secondary | ICD-10-CM | POA: Diagnosis not present

## 2017-09-27 DIAGNOSIS — E039 Hypothyroidism, unspecified: Secondary | ICD-10-CM | POA: Diagnosis not present

## 2017-12-27 DIAGNOSIS — N401 Enlarged prostate with lower urinary tract symptoms: Secondary | ICD-10-CM | POA: Diagnosis not present

## 2017-12-27 DIAGNOSIS — I10 Essential (primary) hypertension: Secondary | ICD-10-CM | POA: Diagnosis not present

## 2017-12-27 DIAGNOSIS — E039 Hypothyroidism, unspecified: Secondary | ICD-10-CM | POA: Diagnosis not present

## 2018-03-06 ENCOUNTER — Ambulatory Visit: Payer: Medicare PPO | Admitting: Urology

## 2018-03-06 DIAGNOSIS — N401 Enlarged prostate with lower urinary tract symptoms: Secondary | ICD-10-CM | POA: Diagnosis not present

## 2018-03-06 DIAGNOSIS — R310 Gross hematuria: Secondary | ICD-10-CM

## 2018-03-28 DIAGNOSIS — R739 Hyperglycemia, unspecified: Secondary | ICD-10-CM | POA: Diagnosis not present

## 2018-03-28 DIAGNOSIS — Z6832 Body mass index (BMI) 32.0-32.9, adult: Secondary | ICD-10-CM | POA: Diagnosis not present

## 2018-03-28 DIAGNOSIS — E785 Hyperlipidemia, unspecified: Secondary | ICD-10-CM | POA: Diagnosis not present

## 2018-03-28 DIAGNOSIS — Z1389 Encounter for screening for other disorder: Secondary | ICD-10-CM | POA: Diagnosis not present

## 2018-03-28 DIAGNOSIS — I10 Essential (primary) hypertension: Secondary | ICD-10-CM | POA: Diagnosis not present

## 2018-03-28 DIAGNOSIS — Z1331 Encounter for screening for depression: Secondary | ICD-10-CM | POA: Diagnosis not present

## 2018-03-28 DIAGNOSIS — Z0000 Encounter for general adult medical examination without abnormal findings: Secondary | ICD-10-CM | POA: Diagnosis not present

## 2018-03-28 DIAGNOSIS — E039 Hypothyroidism, unspecified: Secondary | ICD-10-CM | POA: Diagnosis not present

## 2018-03-28 DIAGNOSIS — N401 Enlarged prostate with lower urinary tract symptoms: Secondary | ICD-10-CM | POA: Diagnosis not present

## 2018-04-30 DIAGNOSIS — H2513 Age-related nuclear cataract, bilateral: Secondary | ICD-10-CM | POA: Diagnosis not present

## 2018-04-30 DIAGNOSIS — H401131 Primary open-angle glaucoma, bilateral, mild stage: Secondary | ICD-10-CM | POA: Diagnosis not present

## 2018-04-30 DIAGNOSIS — H25013 Cortical age-related cataract, bilateral: Secondary | ICD-10-CM | POA: Diagnosis not present

## 2018-05-17 DIAGNOSIS — I10 Essential (primary) hypertension: Secondary | ICD-10-CM | POA: Diagnosis not present

## 2018-05-17 DIAGNOSIS — N400 Enlarged prostate without lower urinary tract symptoms: Secondary | ICD-10-CM | POA: Diagnosis not present

## 2018-05-17 DIAGNOSIS — Z9049 Acquired absence of other specified parts of digestive tract: Secondary | ICD-10-CM | POA: Diagnosis not present

## 2018-05-17 DIAGNOSIS — E669 Obesity, unspecified: Secondary | ICD-10-CM | POA: Diagnosis not present

## 2018-05-17 DIAGNOSIS — K219 Gastro-esophageal reflux disease without esophagitis: Secondary | ICD-10-CM | POA: Diagnosis not present

## 2018-05-17 DIAGNOSIS — Z6832 Body mass index (BMI) 32.0-32.9, adult: Secondary | ICD-10-CM | POA: Diagnosis not present

## 2018-05-17 DIAGNOSIS — E039 Hypothyroidism, unspecified: Secondary | ICD-10-CM | POA: Diagnosis not present

## 2018-05-17 DIAGNOSIS — M19041 Primary osteoarthritis, right hand: Secondary | ICD-10-CM | POA: Diagnosis not present

## 2018-05-17 DIAGNOSIS — M19042 Primary osteoarthritis, left hand: Secondary | ICD-10-CM | POA: Diagnosis not present

## 2018-06-27 DIAGNOSIS — E039 Hypothyroidism, unspecified: Secondary | ICD-10-CM | POA: Diagnosis not present

## 2018-06-27 DIAGNOSIS — Z6832 Body mass index (BMI) 32.0-32.9, adult: Secondary | ICD-10-CM | POA: Diagnosis not present

## 2018-06-27 DIAGNOSIS — I10 Essential (primary) hypertension: Secondary | ICD-10-CM | POA: Diagnosis not present

## 2018-06-27 DIAGNOSIS — Z23 Encounter for immunization: Secondary | ICD-10-CM | POA: Diagnosis not present

## 2018-06-27 DIAGNOSIS — N401 Enlarged prostate with lower urinary tract symptoms: Secondary | ICD-10-CM | POA: Diagnosis not present

## 2018-09-26 DIAGNOSIS — N401 Enlarged prostate with lower urinary tract symptoms: Secondary | ICD-10-CM | POA: Diagnosis not present

## 2018-09-26 DIAGNOSIS — E039 Hypothyroidism, unspecified: Secondary | ICD-10-CM | POA: Diagnosis not present

## 2018-09-26 DIAGNOSIS — Z6832 Body mass index (BMI) 32.0-32.9, adult: Secondary | ICD-10-CM | POA: Diagnosis not present

## 2018-09-26 DIAGNOSIS — I10 Essential (primary) hypertension: Secondary | ICD-10-CM | POA: Diagnosis not present

## 2018-12-17 DIAGNOSIS — E039 Hypothyroidism, unspecified: Secondary | ICD-10-CM | POA: Diagnosis not present

## 2018-12-17 DIAGNOSIS — N401 Enlarged prostate with lower urinary tract symptoms: Secondary | ICD-10-CM | POA: Diagnosis not present

## 2018-12-17 DIAGNOSIS — I10 Essential (primary) hypertension: Secondary | ICD-10-CM | POA: Diagnosis not present

## 2019-03-06 DIAGNOSIS — Z1331 Encounter for screening for depression: Secondary | ICD-10-CM | POA: Diagnosis not present

## 2019-03-06 DIAGNOSIS — N401 Enlarged prostate with lower urinary tract symptoms: Secondary | ICD-10-CM | POA: Diagnosis not present

## 2019-03-06 DIAGNOSIS — Z0001 Encounter for general adult medical examination with abnormal findings: Secondary | ICD-10-CM | POA: Diagnosis not present

## 2019-03-06 DIAGNOSIS — I10 Essential (primary) hypertension: Secondary | ICD-10-CM | POA: Diagnosis not present

## 2019-03-06 DIAGNOSIS — Z1389 Encounter for screening for other disorder: Secondary | ICD-10-CM | POA: Diagnosis not present

## 2019-03-06 DIAGNOSIS — E039 Hypothyroidism, unspecified: Secondary | ICD-10-CM | POA: Diagnosis not present

## 2019-04-01 ENCOUNTER — Ambulatory Visit (INDEPENDENT_AMBULATORY_CARE_PROVIDER_SITE_OTHER): Payer: Medicare PPO | Admitting: Urology

## 2019-04-01 DIAGNOSIS — R351 Nocturia: Secondary | ICD-10-CM | POA: Diagnosis not present

## 2019-04-01 DIAGNOSIS — N401 Enlarged prostate with lower urinary tract symptoms: Secondary | ICD-10-CM | POA: Diagnosis not present

## 2019-04-06 DIAGNOSIS — I10 Essential (primary) hypertension: Secondary | ICD-10-CM | POA: Diagnosis not present

## 2019-04-06 DIAGNOSIS — E039 Hypothyroidism, unspecified: Secondary | ICD-10-CM | POA: Diagnosis not present

## 2019-05-07 DIAGNOSIS — E039 Hypothyroidism, unspecified: Secondary | ICD-10-CM | POA: Diagnosis not present

## 2019-05-07 DIAGNOSIS — I10 Essential (primary) hypertension: Secondary | ICD-10-CM | POA: Diagnosis not present

## 2019-05-14 DIAGNOSIS — I10 Essential (primary) hypertension: Secondary | ICD-10-CM | POA: Diagnosis not present

## 2019-05-14 DIAGNOSIS — Z0001 Encounter for general adult medical examination with abnormal findings: Secondary | ICD-10-CM | POA: Diagnosis not present

## 2019-05-14 DIAGNOSIS — E039 Hypothyroidism, unspecified: Secondary | ICD-10-CM | POA: Diagnosis not present

## 2019-06-06 DIAGNOSIS — E039 Hypothyroidism, unspecified: Secondary | ICD-10-CM | POA: Diagnosis not present

## 2019-06-06 DIAGNOSIS — I10 Essential (primary) hypertension: Secondary | ICD-10-CM | POA: Diagnosis not present

## 2019-06-26 DIAGNOSIS — Z23 Encounter for immunization: Secondary | ICD-10-CM | POA: Diagnosis not present

## 2019-07-07 DIAGNOSIS — I10 Essential (primary) hypertension: Secondary | ICD-10-CM | POA: Diagnosis not present

## 2019-07-07 DIAGNOSIS — E039 Hypothyroidism, unspecified: Secondary | ICD-10-CM | POA: Diagnosis not present

## 2019-08-12 DIAGNOSIS — I10 Essential (primary) hypertension: Secondary | ICD-10-CM | POA: Diagnosis not present

## 2019-08-12 DIAGNOSIS — Z6832 Body mass index (BMI) 32.0-32.9, adult: Secondary | ICD-10-CM | POA: Diagnosis not present

## 2019-08-12 DIAGNOSIS — E039 Hypothyroidism, unspecified: Secondary | ICD-10-CM | POA: Diagnosis not present

## 2019-08-12 DIAGNOSIS — N401 Enlarged prostate with lower urinary tract symptoms: Secondary | ICD-10-CM | POA: Diagnosis not present

## 2019-09-12 DIAGNOSIS — I10 Essential (primary) hypertension: Secondary | ICD-10-CM | POA: Diagnosis not present

## 2019-09-12 DIAGNOSIS — E039 Hypothyroidism, unspecified: Secondary | ICD-10-CM | POA: Diagnosis not present

## 2019-10-13 DIAGNOSIS — N429 Disorder of prostate, unspecified: Secondary | ICD-10-CM | POA: Diagnosis not present

## 2019-10-13 DIAGNOSIS — E039 Hypothyroidism, unspecified: Secondary | ICD-10-CM | POA: Diagnosis not present

## 2019-11-04 ENCOUNTER — Ambulatory Visit: Payer: Medicare PPO | Attending: Internal Medicine

## 2019-11-10 DIAGNOSIS — E039 Hypothyroidism, unspecified: Secondary | ICD-10-CM | POA: Diagnosis not present

## 2019-11-10 DIAGNOSIS — I10 Essential (primary) hypertension: Secondary | ICD-10-CM | POA: Diagnosis not present

## 2019-12-16 DIAGNOSIS — N401 Enlarged prostate with lower urinary tract symptoms: Secondary | ICD-10-CM | POA: Diagnosis not present

## 2019-12-16 DIAGNOSIS — I10 Essential (primary) hypertension: Secondary | ICD-10-CM | POA: Diagnosis not present

## 2019-12-16 DIAGNOSIS — E039 Hypothyroidism, unspecified: Secondary | ICD-10-CM | POA: Diagnosis not present

## 2019-12-16 DIAGNOSIS — Z6832 Body mass index (BMI) 32.0-32.9, adult: Secondary | ICD-10-CM | POA: Diagnosis not present

## 2020-01-15 DIAGNOSIS — I10 Essential (primary) hypertension: Secondary | ICD-10-CM | POA: Diagnosis not present

## 2020-01-15 DIAGNOSIS — E039 Hypothyroidism, unspecified: Secondary | ICD-10-CM | POA: Diagnosis not present

## 2020-02-17 DIAGNOSIS — E039 Hypothyroidism, unspecified: Secondary | ICD-10-CM | POA: Diagnosis not present

## 2020-02-17 DIAGNOSIS — I10 Essential (primary) hypertension: Secondary | ICD-10-CM | POA: Diagnosis not present

## 2020-02-27 ENCOUNTER — Ambulatory Visit
Admission: EM | Admit: 2020-02-27 | Discharge: 2020-02-27 | Disposition: A | Discharge status: Home / Self Care | Payer: Medicare PPO | Attending: Emergency Medicine | Admitting: Emergency Medicine

## 2020-02-27 DIAGNOSIS — M79675 Pain in left toe(s): Secondary | ICD-10-CM

## 2020-02-27 DIAGNOSIS — M109 Gout, unspecified: Secondary | ICD-10-CM | POA: Diagnosis not present

## 2020-02-27 MED ORDER — DEXAMETHASONE SODIUM PHOSPHATE 10 MG/ML IJ SOLN
10.0000 mg | Freq: Once | INTRAMUSCULAR | Status: AC
  Administered 2020-02-27: 18:00:00 10 mg via INTRAMUSCULAR

## 2020-02-27 MED ORDER — PREDNISONE 20 MG PO TABS: 20.0000 mg | ORAL_TABLET | Freq: Two times a day (BID) | ORAL | 0 refills | Status: AC

## 2020-02-27 NOTE — ED Provider Notes (Signed)
[facility]   [id number] 02/27/20 Arrival Time: 0300  CC:LT great toe pain  SUBJECTIVE: History from: patient. Victor Hunter is a 86 y.o. male complains of LT great toe pain and swelling x 4 days.  Denies a precipitating event or specific injury.  Localizes the pain to the great toe.  Describes the pain as intermittent and throbbing in character.  Has tried soaks without relief.  Symptoms are made worse to the touch.  Denies similar symptoms in the past.  Denies fever, chills, ecchymosis, weakness, numbness and tingling  ROS: As per HPI.  All other pertinent ROS negative.     Past Medical History:  Diagnosis Date  . Blood transfusion without reported diagnosis   . Chronic anemia   . Enlarged prostate    Elevated PSA  . Esophagitis    High grade squamous dysplasia occurring in the background of chronic active esophagitis,, EGD Dr. Christoper Fabian benign biopsies February 2005  . GERD (gastroesophageal reflux disease)   . History of BPH    s/p TURP 2008  . Hypertension   . Ileus ([place])   . Mallory - Weiss tear   . Thyroid disease   . Upper GI bleed    Past Surgical History:  Procedure Laterality Date  . CENTRAL VENOUS CATHETER INSERTION  04/26/2012   Procedure: INSERTION CENTRAL LINE ADULT;  Surgeon: Donato Heinz, MD;  Location: [facility];  Service: General;  Laterality: Left;  started at 0919, ended at 0930,        subclavian  . CHOLECYSTECTOMY    . COLONOSCOPY  10/29/03   rourk-inflammatory polyp from 40 cm removed  . COLONOSCOPY  10/02/2012   Dr. Gala Romney: normal  . ESOPHAGOGASTRODUODENOSCOPY  08/31/08   Rourk-possible extrinsic impression on midesophagus, noncritical Schatzki's ring, 3 mm fundal gland polyp, multiple gastric fundic polyps not manipulated, small hiatal hernia : CT chest showed mediastinal mass, eventually underwent thyroidectomy  . ESOPHAGOGASTRODUODENOSCOPY  08/27/2012   Dr. Cristina Gong: moderate sized hiatal hernia, cameron lesions  . LAPAROTOMY  04/26/2012    Procedure: EXPLORATORY LAPAROTOMY;  Surgeon: Donato Heinz, MD;  Location: [facility];  Service: General;  Laterality: N/A;  . THYROIDECTOMY    . TRANSURETHRAL RESECTION OF PROSTATE     pt denies, but this is listed in his past medical records.    No Known Allergies No current facility-administered medications on file prior to encounter.   Current Outpatient Medications on File Prior to Encounter  Medication Sig Dispense Refill  . diltiazem (DILACOR XR) 180 MG 24 hr capsule Take 180 mg by mouth daily.    . finasteride (PROSCAR) 5 MG tablet TK 1 T PO D UTD  3  . HYDROcodone-acetaminophen (NORCO/VICODIN) 5-325 MG tablet Take 1 tablet by mouth every 6 (six) hours as needed for severe pain. 15 tablet 0  . latanoprost (XALATAN) 0.005 % ophthalmic solution Place 1 drop into both eyes at bedtime.    Marland Kitchen levothyroxine (SYNTHROID, LEVOTHROID) 112 MCG tablet Take 112 mcg by mouth daily before breakfast.    . pantoprazole (PROTONIX) 40 MG tablet Take 1 tablet (40 mg total) by mouth 2 (two) times daily before a meal. (Patient taking differently: Take 40 mg by mouth daily. ) 60 tablet 2   Social History   Socioeconomic History  . Marital status: Married    Spouse name: Not on file  . Number of children: 4  . Years of education: Not on file  . Highest education level: Not on file  Occupational  History  . 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 Smoking status: Former Smoker    Packs/day: 0.50    Types: Cigarettes    Quit date: 09/10/1992    Years since quitting: 27.4  . Smokeless tobacco: Never Used  Substance and Sexual Activity  . Alcohol use: No  . Drug use: No  . Sexual activity: Never  Other Topics Concern  . Not on file  Social History Narrative  . Not on file   Social Determinants of Health   Financial Resource Strain:   . Difficulty of Paying Living Expenses:   Food Insecurity:   . Worried About Charity fundraiser in the  Last Year:   . Arboriculturist in the Last Year:   Transportation Needs:   . Film/video editor (Medical):   Marland Kitchen Lack of Transportation (Non-Medical):   Physical Activity:   . Days of Exercise per Week:   . Minutes of Exercise per Session:   Stress:   . Feeling of Stress :   Social Connections:   . Frequency of Communication with Friends and Family:   . Frequency of Social Gatherings with Friends and Family:   . Attends Religious Services:   . Active Member of Clubs or Organizations:   . Attends Archivist Meetings:   Marland Kitchen Marital Status:   Intimate Partner Violence:   . Fear of Current or Ex-Partner:   . Emotionally Abused:   Marland Kitchen Physically Abused:   . Sexually Abused:    Family History  Problem Relation Age of Onset  . Cancer Other   . Heart failure Other   . Liver disease Sister   . Breast cancer Sister   . Hypertension Mother   . Stroke Father   . Colon cancer Neg Hx     OBJECTIVE:  Vitals:   02/27/20 1805  BP: 134/61  Pulse: 75  Resp: 17  Temp: 98.8 F (37.1 C)  TempSrc: Oral  SpO2: 97%    General appearance: ALERT; in no acute distress.  Head: NCAT Lungs: Normal respiratory effort CV: LT dorsalis pedis pulse 2+ Musculoskeletal: LT foot  Inspection: Erythema and swelling over great toe Palpation: TTP over medial aspect of 1st MTP joint ROM: FROM active and passive Strength: 5/5 dorsiflexion, 5/5 plantar flexion Skin: warm and dry Neurologic: Ambulates without difficulty; Sensation intact about the lower extremities Psychological: alert and cooperative; normal mood and affect   ASSESSMENT & PLAN:  1. Acute gout involving toe of left foot, unspecified cause   2. Pain and swelling of toe of left foot     Meds ordered this encounter  Medications  . predniSONE (DELTASONE) 20 MG tablet    Sig: Take 1 tablet (20 mg total) by mouth 2 (two) times daily with a meal for 5 days.    Dispense:  10 tablet    Refill:  0    Order Specific Question:    Supervising Provider    Answer:   Raylene Everts [[id number]]  . dexamethasone (DECADRON) injection 10 mg    Steroid shot given in office Prednisone prescribed.  Take as directed and to completion Follow up with PCP as needed Return or go to the ED if you have any new or worsening symptoms fever, chills, nausea, vomiting, worsening symptoms despite treatment, etc...  Reviewed expectations re: course of current medical issues. Questions answered. Outlined signs and symptoms indicating need for  more acute intervention. Patient verbalized understanding. After Visit Summary given.    Lestine Box, PA-C 02/27/20 1814

## 2020-02-27 NOTE — ED Triage Notes (Signed)
Provider triage  

## 2020-02-27 NOTE — Discharge Instructions (Signed)
Steroid shot given in office Prednisone prescribed.  Take as directed and to completion Follow up with PCP as needed Return or go to the ED if you have any new or worsening symptoms fever, chills, nausea, vomiting, worsening symptoms despite treatment, etc..Marland Kitchen

## 2020-03-04 DIAGNOSIS — M1A00X0 Idiopathic chronic gout, unspecified site, without tophus (tophi): Secondary | ICD-10-CM | POA: Diagnosis not present

## 2020-03-04 DIAGNOSIS — I10 Essential (primary) hypertension: Secondary | ICD-10-CM | POA: Diagnosis not present

## 2020-03-06 ENCOUNTER — Ambulatory Visit
Admission: EM | Admit: 2020-03-06 | Discharge: 2020-03-06 | Disposition: A | Discharge status: Home / Self Care | Payer: Medicare PPO | Attending: Emergency Medicine | Admitting: Emergency Medicine

## 2020-03-06 DIAGNOSIS — K30 Functional dyspepsia: Secondary | ICD-10-CM

## 2020-03-06 DIAGNOSIS — K2100 Gastro-esophageal reflux disease with esophagitis, without bleeding: Secondary | ICD-10-CM

## 2020-03-06 MED ORDER — PANTOPRAZOLE SODIUM 40 MG PO TBEC: 40.0000 mg | DELAYED_RELEASE_TABLET | Freq: Every day | ORAL | 2 refills | Status: DC

## 2020-03-06 NOTE — ED Triage Notes (Signed)
Pt presents with epigastric discomfort and frequent belching

## 2020-03-06 NOTE — ED Provider Notes (Signed)
[facility]   [id number] 03/06/20 Arrival Time: 2993  Chief Complaint  Patient presents with   Gastroesophageal Reflux     SUBJECTIVE:  Victor Hunter is a 86 y.o. male presents to the urgent care with a complaint of epigastric discomfort and frequent belching for the past 2 to 3 days.  Report for regurgitation and taste in his mouth.  Denies a precipitating event, trauma.  Localizes discomfort to epigastric area.  Describes as intermittent and mild burning at times.  Has not tried any OTC medication.  Denies alleviating or aggravating factors.  Denies similar symptoms in the past.  Last BM 03/24/2020.   Denies fever, chills, appetite changes, weight changes, nausea, vomiting, chest pain, SOB, diarrhea, constipation, hematochezia, melena, dysuria, difficulty urinating, increased frequency or urgency, flank pain, loss of bowel or bladder function.     No LMP for male patient.  ROS: As per HPI.  All other pertinent ROS negative.     Past Medical History:  Diagnosis Date   Blood transfusion without reported diagnosis    Chronic anemia    Enlarged prostate    Elevated PSA   Esophagitis    High grade squamous dysplasia occurring in the background of chronic active esophagitis,, EGD Dr. Christoper Fabian benign biopsies February 2005   GERD (gastroesophageal reflux disease)    History of BPH    s/p TURP 2008   Hypertension    Ileus ([place])    Mallory - Weiss tear    Thyroid disease    Upper GI bleed    Past Surgical History:  Procedure Laterality Date   CENTRAL VENOUS CATHETER INSERTION  04/26/2012   Procedure: INSERTION CENTRAL LINE ADULT;  Surgeon: Donato Heinz, MD;  Location: [facility];  Service: General;  Laterality: Left;  started at 0919, ended at 0930,        subclavian   CHOLECYSTECTOMY     COLONOSCOPY  10/29/03   rourk-inflammatory polyp from 40 cm removed   COLONOSCOPY  10/02/2012   Dr. Gala Romney: normal   ESOPHAGOGASTRODUODENOSCOPY  08/31/08    Rourk-possible extrinsic impression on midesophagus, noncritical Schatzki's ring, 3 mm fundal gland polyp, multiple gastric fundic polyps not manipulated, small hiatal hernia : CT chest showed mediastinal mass, eventually underwent thyroidectomy   ESOPHAGOGASTRODUODENOSCOPY  08/27/2012   Dr. Cristina Gong: moderate sized hiatal hernia, cameron lesions   LAPAROTOMY  04/26/2012   Procedure: EXPLORATORY LAPAROTOMY;  Surgeon: Donato Heinz, MD;  Location: [facility];  Service: General;  Laterality: N/A;   THYROIDECTOMY     TRANSURETHRAL RESECTION OF PROSTATE     pt denies, but this is listed in his past medical records.    No Known Allergies No current facility-administered medications on file prior to encounter.   Current Outpatient Medications on File Prior to Encounter  Medication Sig Dispense Refill   diltiazem (DILACOR XR) 180 MG 24 hr capsule Take 180 mg by mouth daily.     finasteride (PROSCAR) 5 MG tablet TK 1 T PO D UTD  3   HYDROcodone-acetaminophen (NORCO/VICODIN) 5-325 MG tablet Take 1 tablet by mouth every 6 (six) hours as needed for severe pain. 15 tablet 0   latanoprost (XALATAN) 0.005 % ophthalmic solution Place 1 drop into both eyes at bedtime.     levothyroxine (SYNTHROID, LEVOTHROID) 112 MCG tablet Take 112 mcg by mouth daily before breakfast.     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[place]: [employer]  Tobacco Use   Smoking status: Former Smoker    Packs/day: 0.50    Types: Cigarettes    Quit date: 09/10/1992    Years since quitting: 27.5   Smokeless tobacco: Never Used  Substance and Sexual Activity   Alcohol use: No   Drug use: No   Sexual activity: Never  Other Topics Concern   Not on file  Social History  Narrative   Not on file   Social Determinants of Health   Financial Resource Strain:    Difficulty of Paying Living Expenses:   Food Insecurity:    Worried About Charity fundraiser in the Last Year:    Arboriculturist in the Last Year:   Transportation Needs:    Film/video editor (Medical):    Lack of Transportation (Non-Medical):   Physical Activity:    Days of Exercise per Week:    Minutes of Exercise per Session:   Stress:    Feeling of Stress :   Social Connections:    Frequency of Communication with Friends and Family:    Frequency of Social Gatherings with Friends and Family:    Attends Religious Services:    Active Member of Clubs or Organizations:    Attends Music therapist:    Marital Status:   Intimate Partner Violence:    Fear of Current or Ex-Partner:    Emotionally Abused:    Physically Abused:    Sexually Abused:    Family History  Problem Relation Age of Onset   Cancer Other    Heart failure Other    Liver disease Sister    Breast cancer Sister    Hypertension Mother    Stroke Father    Colon cancer Neg Hx      OBJECTIVE:  Vitals:   03/06/20 1255  BP: 130/73  Pulse: 76  Resp: 16  Temp: 98.7 F (37.1 C)  TempSrc: Oral  SpO2: 98%    General appearance: Alert; NAD HEENT: NCAT.  Oropharynx clear.  Lungs: clear to auscultation bilaterally without adventitious breath sounds Heart: regular rate and rhythm.  Radial pulses 2+ symmetrical bilaterally Abdomen: soft, non-distended; normal active bowel sounds; non-tender to light and deep palpation; nontender at McBurney's point; negative Murphy's sign; negative rebound; no guarding Back: no CVA tenderness Extremities: no edema; symmetrical with no gross deformities Skin: warm and dry Neurologic: normal gait Psychological: alert and cooperative; normal mood and affect  LABS: No results found for this or any previous visit (from the past 24  hour(s)).  DIAGNOSTIC STUDIES: No results found.   ASSESSMENT & PLAN:  1. Indigestion   2. Gastroesophageal reflux disease with esophagitis, unspecified whether hemorrhage     Meds ordered this encounter  Medications   pantoprazole (PROTONIX) 40 MG tablet    Sig: Take 1 tablet (40 mg total) by mouth daily.    Dispense:  30 tablet    Refill:  2   Patient is stable  at discharge.  GI cocktail was given in office.  Was advised to follow-up with PCP  Discharge Instructions GI cocktail given in office Get rest and drink fluids Protonix prescribed.  Take as directed.   Avoid milk, greasy foods, and spicy and anything that doesnt agree with  you. If you experience new or worsening symptoms return or go to ER such as fever, chills, nausea, vomiting, diarrhea, bloody or dark tarry stools, constipation, urinary symptoms, worsening abdominal discomfort, symptoms that do not improve with medications, inability to keep fluids down, etc...  Reviewed expectations re: course of current medical issues. Questions answered. Outlined signs and symptoms indicating need for more acute intervention. Patient verbalized understanding. After Visit Summary given.   Emerson Monte, FNP 03/06/20 1339

## 2020-03-06 NOTE — Discharge Instructions (Addendum)
GI cocktail given in office Get rest and drink fluids Protonix prescribed.  Take as directed.   Avoid milk, greasy foods, and spicy and anything that doesn't agree with you. If you experience new or worsening symptoms return or go to ER such as fever, chills, nausea, vomiting, diarrhea, bloody or dark tarry stools, constipation, urinary symptoms, worsening abdominal discomfort, symptoms that do not improve with medications, inability to keep fluids down, etc..Marland Kitchen

## 2020-04-04 DIAGNOSIS — E039 Hypothyroidism, unspecified: Secondary | ICD-10-CM | POA: Diagnosis not present

## 2020-04-04 DIAGNOSIS — I10 Essential (primary) hypertension: Secondary | ICD-10-CM | POA: Diagnosis not present

## 2020-05-05 DIAGNOSIS — I10 Essential (primary) hypertension: Secondary | ICD-10-CM | POA: Diagnosis not present

## 2020-05-05 DIAGNOSIS — E039 Hypothyroidism, unspecified: Secondary | ICD-10-CM | POA: Diagnosis not present

## 2020-06-08 DIAGNOSIS — I10 Essential (primary) hypertension: Secondary | ICD-10-CM | POA: Diagnosis not present

## 2020-06-08 DIAGNOSIS — Z1331 Encounter for screening for depression: Secondary | ICD-10-CM | POA: Diagnosis not present

## 2020-06-08 DIAGNOSIS — Z1389 Encounter for screening for other disorder: Secondary | ICD-10-CM | POA: Diagnosis not present

## 2020-06-08 DIAGNOSIS — Z23 Encounter for immunization: Secondary | ICD-10-CM | POA: Diagnosis not present

## 2020-06-08 DIAGNOSIS — N401 Enlarged prostate with lower urinary tract symptoms: Secondary | ICD-10-CM | POA: Diagnosis not present

## 2020-06-08 DIAGNOSIS — E039 Hypothyroidism, unspecified: Secondary | ICD-10-CM | POA: Diagnosis not present

## 2020-06-08 DIAGNOSIS — Z0001 Encounter for general adult medical examination with abnormal findings: Secondary | ICD-10-CM | POA: Diagnosis not present

## 2020-06-09 DIAGNOSIS — E039 Hypothyroidism, unspecified: Secondary | ICD-10-CM | POA: Diagnosis not present

## 2020-06-09 DIAGNOSIS — Z0001 Encounter for general adult medical examination with abnormal findings: Secondary | ICD-10-CM | POA: Diagnosis not present

## 2020-06-09 DIAGNOSIS — I10 Essential (primary) hypertension: Secondary | ICD-10-CM | POA: Diagnosis not present

## 2020-07-09 DIAGNOSIS — E039 Hypothyroidism, unspecified: Secondary | ICD-10-CM | POA: Diagnosis not present

## 2020-07-09 DIAGNOSIS — I10 Essential (primary) hypertension: Secondary | ICD-10-CM | POA: Diagnosis not present

## 2020-08-08 DIAGNOSIS — E039 Hypothyroidism, unspecified: Secondary | ICD-10-CM | POA: Diagnosis not present

## 2020-08-08 DIAGNOSIS — I10 Essential (primary) hypertension: Secondary | ICD-10-CM | POA: Diagnosis not present

## 2020-09-08 DIAGNOSIS — E039 Hypothyroidism, unspecified: Secondary | ICD-10-CM | POA: Diagnosis not present

## 2020-09-08 DIAGNOSIS — I10 Essential (primary) hypertension: Secondary | ICD-10-CM | POA: Diagnosis not present

## 2020-10-09 DIAGNOSIS — E039 Hypothyroidism, unspecified: Secondary | ICD-10-CM | POA: Diagnosis not present

## 2020-10-09 DIAGNOSIS — I10 Essential (primary) hypertension: Secondary | ICD-10-CM | POA: Diagnosis not present

## 2020-11-06 DIAGNOSIS — N429 Disorder of prostate, unspecified: Secondary | ICD-10-CM | POA: Diagnosis not present

## 2020-11-06 DIAGNOSIS — I10 Essential (primary) hypertension: Secondary | ICD-10-CM | POA: Diagnosis not present

## 2020-11-16 ENCOUNTER — Ambulatory Visit
Admission: EM | Admit: 2020-11-16 | Discharge: 2020-11-16 | Disposition: A | Discharge status: Home / Self Care | Payer: Medicare PPO | Attending: Emergency Medicine | Admitting: Emergency Medicine

## 2020-11-16 ENCOUNTER — Encounter: Payer: Self-pay | Admitting: Emergency Medicine

## 2020-11-16 ENCOUNTER — Other Ambulatory Visit: Payer: Self-pay

## 2020-11-16 DIAGNOSIS — K5900 Constipation, unspecified: Secondary | ICD-10-CM | POA: Diagnosis not present

## 2020-11-16 DIAGNOSIS — R140 Abdominal distension (gaseous): Secondary | ICD-10-CM

## 2020-11-16 MED ORDER — POLYETHYLENE GLYCOL 3350 17 G PO PACK: 17.0000 g | PACK | Freq: Every day | ORAL | 0 refills | Status: DC

## 2020-11-16 MED ORDER — BISACODYL 5 MG PO TBEC: 5.0000 mg | DELAYED_RELEASE_TABLET | Freq: Every day | ORAL | 0 refills | Status: DC | PRN

## 2020-11-16 NOTE — ED Provider Notes (Signed)
[facility]   [id number] 11/16/20 Arrival Time: 1244  CC: constipation  SUBJECTIVE:  Victor Hunter is a 87 y.o. male who presents with complaint of constipation, pressure, and bloating x 1 day.  Denies a precipitating event, trauma.  Reports mild diffuse pain about the abdomen. Has not tried OTC medications.  Denies alleviating or aggravating factors.  Reports similar symptoms in the past and was treated in the ED.  Last BM yesterday and smaller than normal.  States he does not have a BM everyday.  Last colonoscopy "years ago."    Denies fever, chills, nausea, vomiting, chest pain, SOB, diarrhea, hematochezia, melena, dysuria, difficulty urinating, increased frequency or urgency, flank pain, loss of bowel or bladder function.  No LMP for male patient.  ROS: As per HPI.  All other pertinent ROS negative.     Past Medical History:  Diagnosis Date  . Blood transfusion without reported diagnosis   . Chronic anemia   . Enlarged prostate    Elevated PSA  . Esophagitis    High grade squamous dysplasia occurring in the background of chronic active esophagitis,, EGD Dr. Christoper Fabian benign biopsies February 2005  . GERD (gastroesophageal reflux disease)   . History of BPH    s/p TURP 2008  . Hypertension   . Ileus ([place])   . Mallory - Weiss tear   . Thyroid disease   . Upper GI bleed    Past Surgical History:  Procedure Laterality Date  . CENTRAL VENOUS CATHETER INSERTION  04/26/2012   Procedure: INSERTION CENTRAL LINE ADULT;  Surgeon: Donato Heinz, MD;  Location: [facility];  Service: General;  Laterality: Left;  started at 0919, ended at 0930,        subclavian  . CHOLECYSTECTOMY    . COLONOSCOPY  10/29/03   rourk-inflammatory polyp from 40 cm removed  . COLONOSCOPY  10/02/2012   Dr. Gala Romney: normal  . ESOPHAGOGASTRODUODENOSCOPY  08/31/08   Rourk-possible extrinsic impression on midesophagus, noncritical Schatzki's ring, 3 mm fundal gland polyp, multiple gastric fundic polyps  not manipulated, small hiatal hernia : CT chest showed mediastinal mass, eventually underwent thyroidectomy  . ESOPHAGOGASTRODUODENOSCOPY  08/27/2012   Dr. Cristina Gong: moderate sized hiatal hernia, cameron lesions  . LAPAROTOMY  04/26/2012   Procedure: EXPLORATORY LAPAROTOMY;  Surgeon: Donato Heinz, MD;  Location: [facility];  Service: General;  Laterality: N/A;  . THYROIDECTOMY    . TRANSURETHRAL RESECTION OF PROSTATE     pt denies, but this is listed in his past medical records.    No Known Allergies No current facility-administered medications on file prior to encounter.   Current Outpatient Medications on File Prior to Encounter  Medication Sig Dispense Refill  . diltiazem (DILACOR XR) 180 MG 24 hr capsule Take 180 mg by mouth daily.    . finasteride (PROSCAR) 5 MG tablet TK 1 T PO D UTD  3  . HYDROcodone-acetaminophen (NORCO/VICODIN) 5-325 MG tablet Take 1 tablet by mouth every 6 (six) hours as needed for severe pain. 15 tablet 0  . latanoprost (XALATAN) 0.005 % ophthalmic solution Place 1 drop into both eyes at bedtime.    Marland Kitchen levothyroxine (SYNTHROID, LEVOTHROID) 112 MCG tablet Take 112 mcg by mouth daily before breakfast.    . pantoprazole (PROTONIX) 40 MG tablet Take 1 tablet (40 mg total) by mouth daily. 30 tablet 2   Social History   Socioeconomic History  . Marital status: Married    Spouse name: Not on file  . Number of  children: 4  . Years of education: Not on file  . Highest education level: Not on file  Occupational History  . 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 Smoking status: Former Smoker    Packs/day: 0.50    Types: Cigarettes    Quit date: 09/10/1992    Years since quitting: 28.2  . Smokeless tobacco: Never Used  Substance and Sexual Activity  . Alcohol use: No  . Drug use: No  . Sexual activity: Never  Other Topics Concern  . Not on file  Social History Narrative  . Not on file   Social  Determinants of Health   Financial Resource Strain: Not on file  Food Insecurity: Not on file  Transportation Needs: Not on file  Physical Activity: Not on file  Stress: Not on file  Social Connections: Not on file  Intimate Partner Violence: Not on file   Family History  Problem Relation Age of Onset  . Cancer Other   . Heart failure Other   . Liver disease Sister   . Breast cancer Sister   . Hypertension Mother   . Stroke Father   . Colon cancer Neg Hx      OBJECTIVE:  Vitals:   11/16/20 1253  BP: (!) 153/74  Pulse: (!) 105  Resp: 18  Temp: 99.2 F (37.3 C)  TempSrc: Oral  SpO2: 96%    General appearance: Alert; NAD HEENT: NCAT.  Oropharynx clear.  Lungs: clear to auscultation bilaterally without adventitious breath sounds Heart: regular rate and rhythm.   Abdomen: soft, non-distended; normal active bowel sounds; mildly TTP over central abdomen; nontender at McBurney's point; no guarding Extremities: no edema; symmetrical with no gross deformities Skin: warm and dry Neurologic: normal gait Psychological: alert and cooperative; normal mood and affect   ASSESSMENT & PLAN:  1. Constipation, unspecified constipation type   2. Bloating     Meds ordered this encounter  Medications  . polyethylene glycol (MIRALAX / GLYCOLAX) 17 g packet    Sig: Take 17 g by mouth daily.    Dispense:  14 each    Refill:  0    Order Specific Question:   Supervising Provider    Answer:   Raylene Everts [[id number]]  . bisacodyl (DULCOLAX) 5 MG EC tablet    Sig: Take 1 tablet (5 mg total) by mouth daily as needed for moderate constipation.    Dispense:  30 tablet    Refill:  0    Order Specific Question:   Supervising Provider    Answer:   Raylene Everts [[id number]]    Clean Out:  Limit food to liquids day of cleanout Drink plenty of water to prevent dehydration Mix 8.3 ounces of miralax with 64 ounces of Gatorade Take 3 tabs of dulcolax with water 2 hours later begin  drinking Gatorade - 8 ounces every 15-20 minutes May take 4-6 hour break between first half and second half of mixture Continue with clear liquids After clean out return to daily capful of miralax and please pair with OTC stool softener May use milk of magnesia if not having a bowel movement in 2-3 days OR go to the ED  Reviewed expectations re: course of current medical issues. Questions answered. Outlined signs and symptoms indicating need for more acute intervention. Patient verbalized understanding. After Visit Summary given.   Lestine Box, PA-C 11/16/20 1326

## 2020-11-16 NOTE — Discharge Instructions (Signed)
Clean Out:  Limit food to liquids day of cleanout Drink plenty of water to prevent dehydration Mix 8.3 ounces of miralax with 64 ounces of Gatorade Take 3 tabs of dulcolax with water 2 hours later begin drinking Gatorade - 8 ounces every 15-20 minutes May take 4-6 hour break between first half and second half of mixture Continue with clear liquids After clean out return to daily capful of miralax and please pair with OTC stool softener May use milk of magnesia if not having a bowel movement in 2-3 days OR go to the ED

## 2020-11-16 NOTE — ED Triage Notes (Signed)
States he cannot have a bowel movement.  Last one was yesterday and states it was small.  States stomach hurts.  Feels like he needs to have a BM, but can't

## 2020-11-17 ENCOUNTER — Emergency Department (HOSPITAL_COMMUNITY): Payer: Medicare PPO

## 2020-11-17 ENCOUNTER — Inpatient Hospital Stay (HOSPITAL_COMMUNITY)
Admission: EM | Admit: 2020-11-17 | Discharge: 2020-12-05 | DRG: 871 | Disposition: A | Discharge status: Home Health | Payer: Medicare PPO | Attending: Internal Medicine | Admitting: Internal Medicine

## 2020-11-17 ENCOUNTER — Encounter (HOSPITAL_COMMUNITY): Payer: Self-pay

## 2020-11-17 ENCOUNTER — Inpatient Hospital Stay (HOSPITAL_COMMUNITY): Payer: Medicare PPO

## 2020-11-17 ENCOUNTER — Other Ambulatory Visit: Payer: Self-pay

## 2020-11-17 DIAGNOSIS — I5033 Acute on chronic diastolic (congestive) heart failure: Secondary | ICD-10-CM | POA: Diagnosis present

## 2020-11-17 DIAGNOSIS — N179 Acute kidney failure, unspecified: Secondary | ICD-10-CM

## 2020-11-17 DIAGNOSIS — K439 Ventral hernia without obstruction or gangrene: Secondary | ICD-10-CM | POA: Diagnosis present

## 2020-11-17 DIAGNOSIS — R739 Hyperglycemia, unspecified: Secondary | ICD-10-CM | POA: Diagnosis present

## 2020-11-17 DIAGNOSIS — R0689 Other abnormalities of breathing: Secondary | ICD-10-CM | POA: Diagnosis not present

## 2020-11-17 DIAGNOSIS — M7989 Other specified soft tissue disorders: Secondary | ICD-10-CM | POA: Diagnosis not present

## 2020-11-17 DIAGNOSIS — I5031 Acute diastolic (congestive) heart failure: Secondary | ICD-10-CM

## 2020-11-17 DIAGNOSIS — E872 Acidosis: Secondary | ICD-10-CM | POA: Diagnosis present

## 2020-11-17 DIAGNOSIS — E039 Hypothyroidism, unspecified: Secondary | ICD-10-CM | POA: Diagnosis not present

## 2020-11-17 DIAGNOSIS — R6520 Severe sepsis without septic shock: Secondary | ICD-10-CM | POA: Diagnosis present

## 2020-11-17 DIAGNOSIS — Z0189 Encounter for other specified special examinations: Secondary | ICD-10-CM

## 2020-11-17 DIAGNOSIS — K56609 Unspecified intestinal obstruction, unspecified as to partial versus complete obstruction: Secondary | ICD-10-CM | POA: Diagnosis not present

## 2020-11-17 DIAGNOSIS — I248 Other forms of acute ischemic heart disease: Secondary | ICD-10-CM | POA: Diagnosis present

## 2020-11-17 DIAGNOSIS — I82612 Acute embolism and thrombosis of superficial veins of left upper extremity: Secondary | ICD-10-CM | POA: Diagnosis not present

## 2020-11-17 DIAGNOSIS — I517 Cardiomegaly: Secondary | ICD-10-CM | POA: Diagnosis not present

## 2020-11-17 DIAGNOSIS — K668 Other specified disorders of peritoneum: Secondary | ICD-10-CM

## 2020-11-17 DIAGNOSIS — Z9049 Acquired absence of other specified parts of digestive tract: Secondary | ICD-10-CM | POA: Diagnosis not present

## 2020-11-17 DIAGNOSIS — R7989 Other specified abnormal findings of blood chemistry: Secondary | ICD-10-CM | POA: Diagnosis not present

## 2020-11-17 DIAGNOSIS — N281 Cyst of kidney, acquired: Secondary | ICD-10-CM | POA: Diagnosis present

## 2020-11-17 DIAGNOSIS — I10 Essential (primary) hypertension: Secondary | ICD-10-CM

## 2020-11-17 DIAGNOSIS — N400 Enlarged prostate without lower urinary tract symptoms: Secondary | ICD-10-CM | POA: Diagnosis not present

## 2020-11-17 DIAGNOSIS — J189 Pneumonia, unspecified organism: Secondary | ICD-10-CM

## 2020-11-17 DIAGNOSIS — Z79899 Other long term (current) drug therapy: Secondary | ICD-10-CM

## 2020-11-17 DIAGNOSIS — R918 Other nonspecific abnormal finding of lung field: Secondary | ICD-10-CM | POA: Diagnosis not present

## 2020-11-17 DIAGNOSIS — D72829 Elevated white blood cell count, unspecified: Secondary | ICD-10-CM | POA: Diagnosis present

## 2020-11-17 DIAGNOSIS — A419 Sepsis, unspecified organism: Secondary | ICD-10-CM

## 2020-11-17 DIAGNOSIS — I130 Hypertensive heart and chronic kidney disease with heart failure and stage 1 through stage 4 chronic kidney disease, or unspecified chronic kidney disease: Secondary | ICD-10-CM | POA: Diagnosis not present

## 2020-11-17 DIAGNOSIS — Z20822 Contact with and (suspected) exposure to covid-19: Secondary | ICD-10-CM | POA: Diagnosis not present

## 2020-11-17 DIAGNOSIS — E876 Hypokalemia: Secondary | ICD-10-CM | POA: Diagnosis not present

## 2020-11-17 DIAGNOSIS — I440 Atrioventricular block, first degree: Secondary | ICD-10-CM | POA: Diagnosis present

## 2020-11-17 DIAGNOSIS — K5900 Constipation, unspecified: Secondary | ICD-10-CM | POA: Diagnosis not present

## 2020-11-17 DIAGNOSIS — K219 Gastro-esophageal reflux disease without esophagitis: Secondary | ICD-10-CM

## 2020-11-17 DIAGNOSIS — R069 Unspecified abnormalities of breathing: Secondary | ICD-10-CM | POA: Diagnosis not present

## 2020-11-17 DIAGNOSIS — Z86718 Personal history of other venous thrombosis and embolism: Secondary | ICD-10-CM | POA: Diagnosis not present

## 2020-11-17 DIAGNOSIS — J9601 Acute respiratory failure with hypoxia: Secondary | ICD-10-CM | POA: Diagnosis not present

## 2020-11-17 DIAGNOSIS — Z87891 Personal history of nicotine dependence: Secondary | ICD-10-CM

## 2020-11-17 DIAGNOSIS — R945 Abnormal results of liver function studies: Secondary | ICD-10-CM | POA: Diagnosis not present

## 2020-11-17 DIAGNOSIS — K2100 Gastro-esophageal reflux disease with esophagitis, without bleeding: Secondary | ICD-10-CM | POA: Diagnosis present

## 2020-11-17 DIAGNOSIS — R6510 Systemic inflammatory response syndrome (SIRS) of non-infectious origin without acute organ dysfunction: Secondary | ICD-10-CM

## 2020-11-17 DIAGNOSIS — N1832 Chronic kidney disease, stage 3b: Secondary | ICD-10-CM | POA: Diagnosis present

## 2020-11-17 DIAGNOSIS — K5669 Other partial intestinal obstruction: Secondary | ICD-10-CM | POA: Diagnosis not present

## 2020-11-17 DIAGNOSIS — Z8249 Family history of ischemic heart disease and other diseases of the circulatory system: Secondary | ICD-10-CM

## 2020-11-17 DIAGNOSIS — K449 Diaphragmatic hernia without obstruction or gangrene: Secondary | ICD-10-CM | POA: Diagnosis present

## 2020-11-17 DIAGNOSIS — Z4682 Encounter for fitting and adjustment of non-vascular catheter: Secondary | ICD-10-CM | POA: Diagnosis not present

## 2020-11-17 DIAGNOSIS — J690 Pneumonitis due to inhalation of food and vomit: Secondary | ICD-10-CM | POA: Diagnosis not present

## 2020-11-17 DIAGNOSIS — J810 Acute pulmonary edema: Secondary | ICD-10-CM

## 2020-11-17 DIAGNOSIS — K6389 Other specified diseases of intestine: Secondary | ICD-10-CM | POA: Diagnosis not present

## 2020-11-17 DIAGNOSIS — R000 Tachycardia, unspecified: Secondary | ICD-10-CM | POA: Diagnosis not present

## 2020-11-17 DIAGNOSIS — Z79890 Hormone replacement therapy (postmenopausal): Secondary | ICD-10-CM

## 2020-11-17 DIAGNOSIS — Z4659 Encounter for fitting and adjustment of other gastrointestinal appliance and device: Secondary | ICD-10-CM

## 2020-11-17 DIAGNOSIS — R0902 Hypoxemia: Secondary | ICD-10-CM | POA: Diagnosis not present

## 2020-11-17 DIAGNOSIS — R059 Cough, unspecified: Secondary | ICD-10-CM | POA: Diagnosis not present

## 2020-11-17 DIAGNOSIS — R778 Other specified abnormalities of plasma proteins: Secondary | ICD-10-CM

## 2020-11-17 DIAGNOSIS — T8089XA Other complications following infusion, transfusion and therapeutic injection, initial encounter: Secondary | ICD-10-CM

## 2020-11-17 DIAGNOSIS — I471 Supraventricular tachycardia: Secondary | ICD-10-CM | POA: Diagnosis present

## 2020-11-17 DIAGNOSIS — H409 Unspecified glaucoma: Secondary | ICD-10-CM | POA: Diagnosis present

## 2020-11-17 DIAGNOSIS — E890 Postprocedural hypothyroidism: Secondary | ICD-10-CM | POA: Diagnosis present

## 2020-11-17 DIAGNOSIS — R609 Edema, unspecified: Secondary | ICD-10-CM

## 2020-11-17 DIAGNOSIS — Z9079 Acquired absence of other genital organ(s): Secondary | ICD-10-CM

## 2020-11-17 DIAGNOSIS — R0602 Shortness of breath: Secondary | ICD-10-CM | POA: Diagnosis not present

## 2020-11-17 DIAGNOSIS — R0989 Other specified symptoms and signs involving the circulatory and respiratory systems: Secondary | ICD-10-CM | POA: Diagnosis not present

## 2020-11-17 DIAGNOSIS — J90 Pleural effusion, not elsewhere classified: Secondary | ICD-10-CM | POA: Diagnosis not present

## 2020-11-17 LAB — CBC WITH DIFFERENTIAL/PLATELET
Abs Immature Granulocytes: 0.02 10*3/uL (ref 0.00–0.07)
Basophils Absolute: 0 10*3/uL (ref 0.0–0.1)
Basophils Relative: 0 %
Eosinophils Absolute: 0 10*3/uL (ref 0.0–0.5)
Eosinophils Relative: 0 %
HCT: 46.4 % (ref 39.0–52.0)
Hemoglobin: 14.1 g/dL (ref 13.0–17.0)
Immature Granulocytes: 0 %
Lymphocytes Relative: 10 %
Lymphs Abs: 1.2 10*3/uL (ref 0.7–4.0)
MCH: 25.3 pg — ABNORMAL LOW (ref 26.0–34.0)
MCHC: 30.4 g/dL (ref 30.0–36.0)
MCV: 83.3 fL (ref 80.0–100.0)
Monocytes Absolute: 0.3 10*3/uL (ref 0.1–1.0)
Monocytes Relative: 3 %
Neutro Abs: 9.7 10*3/uL — ABNORMAL HIGH (ref 1.7–7.7)
Neutrophils Relative %: 87 %
Platelets: 260 10*3/uL (ref 150–400)
RBC: 5.57 MIL/uL (ref 4.22–5.81)
RDW: 16.2 % — ABNORMAL HIGH (ref 11.5–15.5)
WBC: 11.2 10*3/uL — ABNORMAL HIGH (ref 4.0–10.5)
nRBC: 0 % (ref 0.0–0.2)

## 2020-11-17 LAB — HEPARIN LEVEL (UNFRACTIONATED): Heparin Unfractionated: 0.8 IU/mL — ABNORMAL HIGH (ref 0.30–0.70)

## 2020-11-17 LAB — ECHOCARDIOGRAM COMPLETE
Area-P 1/2: 3.85 cm2
Height: 63 in
S' Lateral: 2.39 cm
Weight: 2730.18 oz

## 2020-11-17 LAB — COMPREHENSIVE METABOLIC PANEL
ALT: 27 U/L (ref 0–44)
AST: 29 U/L (ref 15–41)
Albumin: 3.8 g/dL (ref 3.5–5.0)
Alkaline Phosphatase: 116 U/L (ref 38–126)
Anion gap: 21 — ABNORMAL HIGH (ref 5–15)
BUN: 33 mg/dL — ABNORMAL HIGH (ref 8–23)
CO2: 19 mmol/L — ABNORMAL LOW (ref 22–32)
Calcium: 9 mg/dL (ref 8.9–10.3)
Chloride: 98 mmol/L (ref 98–111)
Creatinine, Ser: 2.06 mg/dL — ABNORMAL HIGH (ref 0.61–1.24)
GFR, Estimated: 31 mL/min — ABNORMAL LOW (ref >60)
Glucose, Bld: 259 mg/dL — ABNORMAL HIGH (ref 70–99)
Potassium: 2.8 mmol/L — ABNORMAL LOW (ref 3.5–5.1)
Sodium: 138 mmol/L (ref 135–145)
Total Bilirubin: 0.7 mg/dL (ref 0.3–1.2)
Total Protein: 7.8 g/dL (ref 6.5–8.1)

## 2020-11-17 LAB — OCCULT BLOOD GASTRIC / DUODENUM (SPECIMEN CUP)
Occult Blood, Gastric: NEGATIVE
pH, Gastric: 4

## 2020-11-17 LAB — LACTIC ACID, PLASMA
Lactic Acid, Venous: 5.8 mmol/L (ref 0.5–1.9)
Lactic Acid, Venous: 4.6 mmol/L (ref 0.5–1.9)
Lactic Acid, Venous: 3.8 mmol/L (ref 0.5–1.9)
Lactic Acid, Venous: 2.7 mmol/L (ref 0.5–1.9)
Lactic Acid, Venous: 1.4 mmol/L (ref 0.5–1.9)

## 2020-11-17 LAB — D-DIMER, QUANTITATIVE: D-Dimer, Quant: 2.73 ug/mL-FEU — ABNORMAL HIGH (ref 0.00–0.50)

## 2020-11-17 LAB — TROPONIN I (HIGH SENSITIVITY)
Troponin I (High Sensitivity): 144 ng/L (ref <18)
Troponin I (High Sensitivity): 223 ng/L (ref <18)
Troponin I (High Sensitivity): 373 ng/L (ref <18)
Troponin I (High Sensitivity): 344 ng/L (ref <18)
Troponin I (High Sensitivity): 157 ng/L (ref <18)

## 2020-11-17 LAB — HEMOGLOBIN A1C
Hgb A1c MFr Bld: 6.6 % — ABNORMAL HIGH (ref 4.8–5.6)
Mean Plasma Glucose: 142.72 mg/dL

## 2020-11-17 LAB — BRAIN NATRIURETIC PEPTIDE: B Natriuretic Peptide: 203 pg/mL — ABNORMAL HIGH (ref 0.0–100.0)

## 2020-11-17 LAB — MAGNESIUM: Magnesium: 2 mg/dL (ref 1.7–2.4)

## 2020-11-17 LAB — RESP PANEL BY RT-PCR (FLU A&B, COVID) ARPGX2
Influenza A by PCR: NEGATIVE
Influenza B by PCR: NEGATIVE
SARS Coronavirus 2 by RT PCR: NEGATIVE

## 2020-11-17 LAB — PROCALCITONIN: Procalcitonin: 0.38 ng/mL

## 2020-11-17 MED ORDER — LACTATED RINGERS IV SOLN: INTRAVENOUS | Status: AC

## 2020-11-17 MED ORDER — ONDANSETRON HCL 4 MG/2ML IJ SOLN
4.0000 mg | Freq: Four times a day (QID) | INTRAMUSCULAR | Status: DC | PRN
  Administered 2020-11-19: 4 mg via INTRAVENOUS
  Filled 2020-11-17: qty 2

## 2020-11-17 MED ORDER — LATANOPROST 0.005 % OP SOLN: 1.0000 [drp] | Freq: Every day | OPHTHALMIC | Status: DC

## 2020-11-17 MED ORDER — ASPIRIN 325 MG PO TABS
325.0000 mg | ORAL_TABLET | Freq: Once | ORAL | Status: AC
  Administered 2020-11-17: 325 mg via ORAL
  Filled 2020-11-17: qty 1

## 2020-11-17 MED ORDER — PANTOPRAZOLE SODIUM 40 MG IV SOLR
40.0000 mg | INTRAVENOUS | Status: DC
  Administered 2020-11-17 – 2020-11-21 (×5): 40 mg via INTRAVENOUS
  Filled 2020-11-17 (×5): qty 40

## 2020-11-17 MED ORDER — SODIUM CHLORIDE 0.9 % IV BOLUS (SEPSIS)
2000.0000 mL | Freq: Once | INTRAVENOUS | Status: AC
  Administered 2020-11-17: 2000 mL via INTRAVENOUS

## 2020-11-17 MED ORDER — PIPERACILLIN-TAZOBACTAM 3.375 G IVPB 30 MIN
3.3750 g | Freq: Once | INTRAVENOUS | Status: AC
  Administered 2020-11-17: 3.375 g via INTRAVENOUS
  Filled 2020-11-17: qty 50

## 2020-11-17 MED ORDER — TECHNETIUM TO 99M ALBUMIN AGGREGATED
4.0000 | Freq: Once | INTRAVENOUS | Status: AC | PRN
  Administered 2020-11-17: 4 via INTRAVENOUS

## 2020-11-17 MED ORDER — PIPERACILLIN-TAZOBACTAM 3.375 G IVPB
3.3750 g | Freq: Three times a day (TID) | INTRAVENOUS | Status: DC
  Administered 2020-11-17 – 2020-11-20 (×9): 3.375 g via INTRAVENOUS
  Filled 2020-11-17 (×9): qty 50

## 2020-11-17 MED ORDER — VANCOMYCIN HCL 1500 MG/300ML IV SOLN
1500.0000 mg | INTRAVENOUS | Status: AC
  Administered 2020-11-18: 1500 mg via INTRAVENOUS
  Filled 2020-11-17 (×2): qty 300

## 2020-11-17 MED ORDER — HEPARIN BOLUS VIA INFUSION
4000.0000 [IU] | Freq: Once | INTRAVENOUS | Status: AC
  Administered 2020-11-17: 4000 [IU] via INTRAVENOUS

## 2020-11-17 MED ORDER — SODIUM CHLORIDE 0.9 % IV BOLUS
500.0000 mL | Freq: Once | INTRAVENOUS | Status: AC
  Administered 2020-11-17: 500 mL via INTRAVENOUS

## 2020-11-17 MED ORDER — POTASSIUM CHLORIDE 10 MEQ/100ML IV SOLN
10.0000 meq | INTRAVENOUS | Status: AC
  Administered 2020-11-17 (×2): 10 meq via INTRAVENOUS
  Filled 2020-11-17 (×2): qty 100

## 2020-11-17 MED ORDER — MORPHINE SULFATE (PF) 4 MG/ML IV SOLN
4.0000 mg | Freq: Once | INTRAVENOUS | Status: AC
  Administered 2020-11-17: 4 mg via INTRAVENOUS
  Filled 2020-11-17: qty 1

## 2020-11-17 MED ORDER — VANCOMYCIN HCL IN DEXTROSE 1-5 GM/200ML-% IV SOLN
1000.0000 mg | Freq: Once | INTRAVENOUS | Status: AC
  Administered 2020-11-17: 1000 mg via INTRAVENOUS
  Filled 2020-11-17: qty 200

## 2020-11-17 MED ORDER — LEVOTHYROXINE SODIUM 100 MCG/5ML IV SOLN
56.0000 ug | Freq: Every day | INTRAVENOUS | Status: DC
  Administered 2020-11-20 – 2020-11-23 (×4): 56 ug via INTRAVENOUS
  Filled 2020-11-17 (×5): qty 5

## 2020-11-17 MED ORDER — TECHNETIUM TC 99M MEDRONATE IV KIT: 20.0000 | PACK | Freq: Once | INTRAVENOUS | Status: DC | PRN

## 2020-11-17 MED ORDER — HEPARIN (PORCINE) 25000 UT/250ML-% IV SOLN
1050.0000 [IU]/h | INTRAVENOUS | Status: DC
  Administered 2020-11-17: 950 [IU]/h via INTRAVENOUS
  Administered 2020-11-18: 850 [IU]/h via INTRAVENOUS
  Filled 2020-11-17 (×3): qty 250

## 2020-11-17 MED ORDER — DIATRIZOATE MEGLUMINE & SODIUM 66-10 % PO SOLN
90.0000 mL | Freq: Once | ORAL | Status: AC
  Administered 2020-11-18: 90 mL via NASOGASTRIC
  Filled 2020-11-17: qty 90

## 2020-11-17 NOTE — Progress Notes (Addendum)
ANTICOAGULATION CONSULT NOTE -   Pharmacy Consult for Heparin Indication: chest pain/ACS  No Known Allergies  Patient Measurements: Height: 5\' 3"  (160 cm) Weight: 77.4 kg (170 lb 10.2 oz) IBW/kg (Calculated) : 56.9 Heparin Dosing Weight: 70 kg  Vital Signs: Temp: 98.2 F (36.8 C) (03/16 0705) Temp Source: Oral (03/16 0705) BP: 126/71 (03/16 1345) Pulse Rate: 89 (03/16 1345)  Labs: Recent Labs    11/17/20 0203 11/17/20 0330 11/17/20 0752 11/17/20 0914 11/17/20 1226  HGB 14.1  --   --   --   --   HCT 46.4  --   --   --   --   PLT 260  --   --   --   --   HEPARINUNFRC  --   --   --   --  0.80*  CREATININE 2.06*  --   --   --   --   TROPONINIHS 144* 223* 373* 344*  --     Estimated Creatinine Clearance: 24.6 mL/min (A) (by C-G formula based on SCr of 2.06 mg/dL (H)).   Medical History: Past Medical History:  Diagnosis Date  . Blood transfusion without reported diagnosis   . Chronic anemia   . Enlarged prostate    Elevated PSA  . Esophagitis    High grade squamous dysplasia occurring in the background of chronic active esophagitis,, EGD Dr. Christoper Fabian benign biopsies February 2005  . GERD (gastroesophageal reflux disease)   . History of BPH    s/p TURP 2008  . Hypertension   . Ileus ([place])   . Mallory - Weiss tear   . Thyroid disease   . Upper GI bleed     Medications:  No current facility-administered medications on file prior to encounter.   Current Outpatient Medications on File Prior to Encounter  Medication Sig Dispense Refill  . chlorproMAZINE (THORAZINE) 10 MG tablet Take 10 mg by mouth every 6 (six) hours as needed for hiccoughs.    . Colchicine 0.6 MG CAPS Take 1 capsule by mouth daily as needed (gout).    Marland Kitchen diltiazem (CARDIZEM CD) 180 MG 24 hr capsule Take 180 mg by mouth daily.    . finasteride (PROSCAR) 5 MG tablet Take 5 mg by mouth daily.  3  . hydrochlorothiazide (HYDRODIURIL) 25 MG tablet Take 25 mg by mouth daily.    Marland Kitchen levothyroxine  (SYNTHROID, LEVOTHROID) 112 MCG tablet Take 112 mcg by mouth daily before breakfast.    . lisinopril (ZESTRIL) 20 MG tablet Take 20 mg by mouth daily.    . potassium chloride SA (KLOR-CON) 20 MEQ tablet Take 20 mEq by mouth daily.    . tamsulosin (FLOMAX) 0.4 MG CAPS capsule Take 0.4 mg by mouth daily.    . bisacodyl (DULCOLAX) 5 MG EC tablet Take 1 tablet (5 mg total) by mouth daily as needed for moderate constipation. (Patient not taking: Reported on 11/17/2020) 30 tablet 0  . HYDROcodone-acetaminophen (NORCO/VICODIN) 5-325 MG tablet Take 1 tablet by mouth every 6 (six) hours as needed for severe pain. 15 tablet 0  . pantoprazole (PROTONIX) 40 MG tablet Take 1 tablet (40 mg total) by mouth daily. (Patient not taking: Reported on 11/17/2020) 30 tablet 2  . polyethylene glycol (MIRALAX / GLYCOLAX) 17 g packet Take 17 g by mouth daily. (Patient not taking: Reported on 11/17/2020) 14 each 0  . [DISCONTINUED] diltiazem (DILACOR XR) 180 MG 24 hr capsule Take 180 mg by mouth daily.    . [DISCONTINUED] latanoprost (XALATAN) 0.005 %  ophthalmic solution Place 1 drop into both eyes at bedtime.       Assessment: 87 y.o. male with eleavated troponin, possible ACS, for heparin. HL is 0.8, supratherapeutic. No issues with infusion per RN  Goal of Therapy:  Heparin level 0.3-0.7 units/ml Monitor platelets by anticoagulation protocol: Yes   Plan:  Decrease heparin infusion to 850 units/hr Check heparin level in 8 hours and daily while on heparin Monitor for S/S of bleeding  Isac Sarna, BS Vena Austria, BCPS Clinical Pharmacist Pager [phone] 11/17/2020,2:06 PM

## 2020-11-17 NOTE — ED Notes (Addendum)
Pt tolerating room air well and appears to be in NAD. O2 94%. Will continue to monitor.

## 2020-11-17 NOTE — ED Notes (Signed)
Report given to [facility] 

## 2020-11-17 NOTE — Consult Note (Signed)
CC: Abdominal distention; SBO  Requesting provider: Dr. Dina Rich  HPI: RAMERE DOWNS is an 87 y.o. male with hx of HTN, GERD, BPH, prior SBO - presented to [facility] ED with n/v as well as constipation for 3-4 days. Reports distention. He had developed SOB associated with this following emesis. Taken to [facility] where he underwent workup and was found to have possible aspiration pna and SBO. Transfer was requested as he also had rising troponins and concern for NSTEMI  Currently, he has NG in place and feels 'well.' He denies n/v. He reports since leaving [facility] he has begun to pass much flatus. No BM. Reports distention much improved  PSH: Lap chole 2003 - Dr. Marshell Garfinkel for refractory sbo revealed adynamic ileus, Dr. Elby Showers 2013  Past Medical History:  Diagnosis Date  . Blood transfusion without reported diagnosis   . Chronic anemia   . Enlarged prostate    Elevated PSA  . Esophagitis    High grade squamous dysplasia occurring in the background of chronic active esophagitis,, EGD Dr. Christoper Fabian benign biopsies February 2005  . GERD (gastroesophageal reflux disease)   . History of BPH    s/p TURP 2008  . Hypertension   . Ileus ([place])   . Mallory - Weiss tear   . Thyroid disease   . Upper GI bleed     Past Surgical History:  Procedure Laterality Date  . CENTRAL VENOUS CATHETER INSERTION  04/26/2012   Procedure: INSERTION CENTRAL LINE ADULT;  Surgeon: Donato Heinz, MD;  Location: [facility];  Service: General;  Laterality: Left;  started at 0919, ended at 0930,        subclavian  . CHOLECYSTECTOMY    . COLONOSCOPY  10/29/03   rourk-inflammatory polyp from 40 cm removed  . COLONOSCOPY  10/02/2012   Dr. Gala Romney: normal  . ESOPHAGOGASTRODUODENOSCOPY  08/31/08   Rourk-possible extrinsic impression on midesophagus, noncritical Schatzki's ring, 3 mm fundal gland polyp, multiple gastric fundic polyps not manipulated, small hiatal hernia : CT chest showed mediastinal mass, eventually underwent  thyroidectomy  . ESOPHAGOGASTRODUODENOSCOPY  08/27/2012   Dr. Cristina Gong: moderate sized hiatal hernia, cameron lesions  . LAPAROTOMY  04/26/2012   Procedure: EXPLORATORY LAPAROTOMY;  Surgeon: Donato Heinz, MD;  Location: [facility];  Service: General;  Laterality: N/A;  . THYROIDECTOMY    . TRANSURETHRAL RESECTION OF PROSTATE     pt denies, but this is listed in his past medical records.     Family History  Problem Relation Age of Onset  . Cancer Other   . Heart failure Other   . Liver disease Sister   . Breast cancer Sister   . Hypertension Mother   . Stroke Father   . Colon cancer Neg Hx     Social:  reports that he quit smoking about 28 years ago. His smoking use included cigarettes. He smoked 0.50 packs per day. He has never used smokeless tobacco. He reports that he does not drink alcohol and does not use drugs.  Allergies: No Known Allergies  Medications: I have reviewed the patient's current medications.  Results for orders placed or performed during the hospital encounter of 11/17/20 (from the past 48 hour(s))  Resp Panel by RT-PCR (Flu A&B, Covid) Nasopharyngeal Swab     Status: None   Collection Time: 11/17/20  2:00 AM   Specimen: Nasopharyngeal Swab; Nasopharyngeal(NP) swabs in vial transport medium  Result Value Ref Range   SARS Coronavirus 2 by RT PCR NEGATIVE NEGATIVE  Comment: (NOTE) SARS-CoV-2 target nucleic acids are NOT DETECTED.  The SARS-CoV-2 RNA is generally detectable in upper respiratory specimens during the acute phase of infection. The lowest concentration of SARS-CoV-2 viral copies this assay can detect is 138 copies/mL. A negative result does not preclude SARS-Cov-2 infection and should not be used as the sole basis for treatment or other patient management decisions. A negative result may occur with  improper specimen collection/handling, submission of specimen other than nasopharyngeal swab, presence of viral mutation(s) within the areas  targeted by this assay, and inadequate number of viral copies(<138 copies/mL). A negative result must be combined with clinical observations, patient history, and epidemiological information. The expected result is Negative.  Fact Sheet for Patients:  EntrepreneurPulse.com.au  Fact Sheet for Healthcare Providers:  IncredibleEmployment.be  This test is no t yet approved or cleared by the [place] FDA and  has been authorized for detection and/or diagnosis of SARS-CoV-2 by FDA under an Emergency Use Authorization (EUA). This EUA will remain  in effect (meaning this test can be used) for the duration of the COVID-19 declaration under Section 564(b)(1) of the Act, 21 U.S.C.section 360bbb-3(b)(1), unless the authorization is terminated  or revoked sooner.       Influenza A by PCR NEGATIVE NEGATIVE   Influenza B by PCR NEGATIVE NEGATIVE    Comment: (NOTE) The Xpert Xpress SARS-CoV-2/FLU/RSV plus assay is intended as an aid in the diagnosis of influenza from Nasopharyngeal swab specimens and should not be used as a sole basis for treatment. Nasal washings and aspirates are unacceptable for Xpert Xpress SARS-CoV-2/FLU/RSV testing.  Fact Sheet for Patients: EntrepreneurPulse.com.au  Fact Sheet for Healthcare Providers: IncredibleEmployment.be  This test is not yet approved or cleared by the [place] FDA and has been authorized for detection and/or diagnosis of SARS-CoV-2 by FDA under an Emergency Use Authorization (EUA). This EUA will remain in effect (meaning this test can be used) for the duration of the COVID-19 declaration under Section 564(b)(1) of the Act, 21 U.S.C. section 360bbb-3(b)(1), unless the authorization is terminated or revoked.  Performed at [facility], [address] [place], [place] [address]   CBC with Differential     Status: Abnormal   Collection Time: 11/17/20  2:03 AM   Result Value Ref Range   WBC 11.2 (H) 4.0 - 10.5 K/uL   RBC 5.57 4.22 - 5.81 MIL/uL   Hemoglobin 14.1 13.0 - 17.0 g/dL   HCT 46.4 39.0 - 52.0 %   MCV 83.3 80.0 - 100.0 fL   MCH 25.3 (L) 26.0 - 34.0 pg   MCHC 30.4 30.0 - 36.0 g/dL   RDW 16.2 (H) 11.5 - 15.5 %   Platelets 260 150 - 400 K/uL   nRBC 0.0 0.0 - 0.2 %   Neutrophils Relative % 87 %   Neutro Abs 9.7 (H) 1.7 - 7.7 K/uL   Lymphocytes Relative 10 %   Lymphs Abs 1.2 0.7 - 4.0 K/uL   Monocytes Relative 3 %   Monocytes Absolute 0.3 0.1 - 1.0 K/uL   Eosinophils Relative 0 %   Eosinophils Absolute 0.0 0.0 - 0.5 K/uL   Basophils Relative 0 %   Basophils Absolute 0.0 0.0 - 0.1 K/uL   Immature Granulocytes 0 %   Abs Immature Granulocytes 0.02 0.00 - 0.07 K/uL    Comment: Performed at [facility], [address], [place], [place] [address]  Comprehensive metabolic panel     Status: Abnormal   Collection Time: 11/17/20  2:03 AM  Result Value Ref Range   Sodium 138 135 - 145 mmol/L   Potassium 2.8 (L) 3.5 - 5.1 mmol/L   Chloride 98 98 - 111 mmol/L   CO2 19 (L) 22 - 32 mmol/L   Glucose, Bld 259 (H) 70 - 99 mg/dL    Comment: Glucose reference range applies only to samples taken after fasting for at least 8 hours.   BUN 33 (H) 8 - 23 mg/dL   Creatinine, Ser 2.06 (H) 0.61 - 1.24 mg/dL   Calcium 9.0 8.9 - 10.3 mg/dL   Total Protein 7.8 6.5 - 8.1 g/dL   Albumin 3.8 3.5 - 5.0 g/dL   AST 29 15 - 41 U/L   ALT 27 0 - 44 U/L    Comment: RESULTS CONFIRMED BY MANUAL DILUTION   Alkaline Phosphatase 116 38 - 126 U/L   Total Bilirubin 0.7 0.3 - 1.2 mg/dL   GFR, Estimated 31 (L) >60 mL/min    Comment: (NOTE) Calculated using the CKD-EPI Creatinine Equation (2021)    Anion gap 21 (H) 5 - 15    Comment: Performed at [facility], [address] [place], [place] [address]  Brain natriuretic peptide     Status: Abnormal   Collection Time: 11/17/20  2:03 AM  Result Value Ref Range   B Natriuretic Peptide 203.0 (H) 0.0 - 100.0 pg/mL     Comment: Performed at [facility], [address], [place], [place] [address]  Troponin I (High Sensitivity)     Status: Abnormal   Collection Time: 11/17/20  2:03 AM  Result Value Ref Range   Troponin I (High Sensitivity) 144 (HH) <18 ng/L    Comment: CRITICAL RESULT CALLED TO, READ BACK BY AND VERIFIED WITH: S LAMBERT,RN@0302  11/17/20 MKELLY (NOTE) Elevated high sensitivity troponin I (hsTnI) values and significant  changes across serial measurements may suggest ACS but many other  chronic and acute conditions are known to elevate hsTnI results.  Refer to the Links section for chest pain algorithms and additional  guidance. Performed at [facility], [address], [place], [place] [address]   D-dimer, quantitative     Status: Abnormal   Collection Time: 11/17/20  2:03 AM  Result Value Ref Range   D-Dimer, Quant 2.73 (H) 0.00 - 0.50 ug/mL-FEU    Comment: (NOTE) At the manufacturer cut-off value of 0.5 g/mL FEU, this assay has a negative predictive value of 95-100%.This assay is intended for use in conjunction with a clinical pretest probability (PTP) assessment model to exclude pulmonary embolism (PE) and deep venous thrombosis (DVT) in outpatients suspected of PE or DVT. Results should be correlated with clinical presentation. Performed at [facility], [address] [place], [place] [address]   Magnesium     Status: None   Collection Time: 11/17/20  2:03 AM  Result Value Ref Range   Magnesium 2.0 1.7 - 2.4 mg/dL    Comment: Performed at [facility], [address], [place], [place] [address]  Procalcitonin - Baseline     Status: None   Collection Time: 11/17/20  2:03 AM  Result Value Ref Range   Procalcitonin 0.38 ng/mL    Comment:        Interpretation: PCT (Procalcitonin) <= 0.5 ng/mL: Systemic infection (sepsis) is not likely. Local bacterial infection is possible. (NOTE)       Sepsis PCT Algorithm           Lower Respiratory Tract  Infection PCT Algorithm    ----------------------------     ----------------------------         PCT < 0.25 ng/mL                PCT < 0.10 ng/mL          Strongly encourage             Strongly discourage   discontinuation of antibiotics    initiation of antibiotics    ----------------------------     -----------------------------       PCT 0.25 - 0.50 ng/mL            PCT 0.10 - 0.25 ng/mL               OR       >80% decrease in PCT            Discourage initiation of                                            antibiotics      Encourage discontinuation           of antibiotics    ----------------------------     -----------------------------         PCT >= 0.50 ng/mL              PCT 0.26 - 0.50 ng/mL               AND        <80% decrease in PCT             Encourage initiation of                                             antibiotics       Encourage continuation           of antibiotics    ----------------------------     -----------------------------        PCT >= 0.50 ng/mL                  PCT > 0.50 ng/mL               AND         increase in PCT                  Strongly encourage                                      initiation of antibiotics    Strongly encourage escalation           of antibiotics                                     -----------------------------                                           PCT <= 0.25 ng/mL  OR                                        > 80% decrease in PCT                                      Discontinue / Do not initiate                                             antibiotics  Performed at [facility], [address], [place], [place] [address]   Hemoglobin A1c     Status: Abnormal   Collection Time: 11/17/20  2:03 AM  Result Value Ref Range   Hgb A1c MFr Bld 6.6 (H) 4.8 - 5.6 %    Comment: (NOTE) Pre diabetes:          5.7%-6.4%  Diabetes:              >6.4%  Glycemic control  for   <7.0% adults with diabetes    Mean Plasma Glucose 142.72 mg/dL    Comment: Performed at [facility] [address], [place], [place] [address]  Troponin I (High Sensitivity)     Status: Abnormal   Collection Time: 11/17/20  3:30 AM  Result Value Ref Range   Troponin I (High Sensitivity) 223 (HH) <18 ng/L    Comment: CRITICAL RESULT CALLED TO, READ BACK BY AND VERIFIED WITH: LAMBERT,S AT 4:35AM ON 11/17/20 BY FESTERMAN,C (NOTE) Elevated high sensitivity troponin I (hsTnI) values and significant  changes across serial measurements may suggest ACS but many other  chronic and acute conditions are known to elevate hsTnI results.  Refer to the Links section for chest pain algorithms and additional  guidance. Performed at [facility], [address] [place], [place] [address]   Lactic acid, plasma     Status: Abnormal   Collection Time: 11/17/20  5:04 AM  Result Value Ref Range   Lactic Acid, Venous 5.8 (HH) 0.5 - 1.9 mmol/L    Comment: CRITICAL RESULT CALLED TO, READ BACK BY AND VERIFIED WITH: Sharyon Cable AT 5:30AM ON 11/17/20 BY [facility] Performed at [facility], [address] [place], [place] [address]   Blood culture (routine x 2)     Status: None (Preliminary result)   Collection Time: 11/17/20  5:05 AM   Specimen: BLOOD  Result Value Ref Range   Specimen Description BLOOD RIGHT ANTECUBITAL    Special Requests      BOTTLES DRAWN AEROBIC AND ANAEROBIC Blood Culture adequate volume   Culture      NO GROWTH < 12 HOURS Performed at [facility], [address] [place], [place] [address]    Report Status PENDING   Blood culture (routine x 2)     Status: None (Preliminary result)   Collection Time: 11/17/20  5:10 AM   Specimen: BLOOD  Result Value Ref Range   Specimen Description BLOOD LEFT ANTECUBITAL    Special Requests      BOTTLES DRAWN AEROBIC AND ANAEROBIC Blood Culture adequate volume   Culture      NO GROWTH < 12 HOURS Performed at [facility]  [address] [place], [place] [address]  Report Status PENDING   Occult bld gastric/duodenum (cup to lab)     Status: None   Collection Time: 11/17/20  5:22 AM  Result Value Ref Range   pH, Gastric 4    Occult Blood, Gastric NEGATIVE NEGATIVE    Comment: Performed at [facility], [address] [place], [place] [address]  Lactic acid, plasma     Status: Abnormal   Collection Time: 11/17/20  6:24 AM  Result Value Ref Range   Lactic Acid, Venous 4.6 (HH) 0.5 - 1.9 mmol/L    Comment: CRITICAL VALUE NOTED.  VALUE IS CONSISTENT WITH PREVIOUSLY REPORTED AND CALLED VALUE. Performed at [facility], [address], [place], [place] [address]   Troponin I (High Sensitivity)     Status: Abnormal   Collection Time: 11/17/20  7:52 AM  Result Value Ref Range   Troponin I (High Sensitivity) 373 (HH) <18 ng/L    Comment: CRITICAL RESULT CALLED TO, READ BACK BY AND VERIFIED WITH: HOLCOMB,R [id number] 11/17/2020 COLEMAN,R (NOTE) Elevated high sensitivity troponin I (hsTnI) values and significant  changes across serial measurements may suggest ACS but many other  chronic and acute conditions are known to elevate hsTnI results.  Refer to the Links section for chest pain algorithms and additional  guidance. Performed at [facility], [address] [place], [place] [address]   Lactic acid, plasma     Status: Abnormal   Collection Time: 11/17/20  9:14 AM  Result Value Ref Range   Lactic Acid, Venous 3.8 (HH) 0.5 - 1.9 mmol/L    Comment: CRITICAL VALUE NOTED.  VALUE IS CONSISTENT WITH PREVIOUSLY REPORTED AND CALLED VALUE. Performed at [facility], [address], [place], [place] [address]   Troponin I (High Sensitivity)     Status: Abnormal   Collection Time: 11/17/20  9:14 AM  Result Value Ref Range   Troponin I (High Sensitivity) 344 (HH) <18 ng/L    Comment: CRITICAL VALUE NOTED.  VALUE IS CONSISTENT WITH PREVIOUSLY REPORTED AND CALLED VALUE. (NOTE) Elevated high sensitivity troponin I (hsTnI) values  and significant  changes across serial measurements may suggest ACS but many other  chronic and acute conditions are known to elevate hsTnI results.  Refer to the Links section for chest pain algorithms and additional  guidance. Performed at [facility], [address] [place], [place] [address]   Heparin level (unfractionated)     Status: Abnormal   Collection Time: 11/17/20 12:26 PM  Result Value Ref Range   Heparin Unfractionated 0.80 (H) 0.30 - 0.70 IU/mL    Comment: (NOTE) If heparin results are below expected values, and patient dosage has  been confirmed, suggest follow up testing of antithrombin III levels. Performed at [facility], [address] [place], [place] [address]   Lactic acid, plasma     Status: Abnormal   Collection Time: 11/17/20 12:27 PM  Result Value Ref Range   Lactic Acid, Venous 2.7 (HH) 0.5 - 1.9 mmol/L    Comment: CRITICAL VALUE NOTED.  VALUE IS CONSISTENT WITH PREVIOUSLY REPORTED AND CALLED VALUE. Performed at [facility], [address], [place], [place] [address]   Troponin I (High Sensitivity)     Status: Abnormal   Collection Time: 11/17/20  8:55 PM  Result Value Ref Range   Troponin I (High Sensitivity) 157 (HH) <18 ng/L    Comment: CRITICAL RESULT CALLED TO, READ BACK BY AND VERIFIED WITH: THOMPSON,R ON 11/17/20 AT 2210 BY LOY,C (NOTE) Elevated high sensitivity troponin I (hsTnI) values  and significant  changes across serial measurements may suggest ACS but many other  chronic and acute conditions are known to elevate hsTnI results.  Refer to the Links section for chest pain algorithms and additional  guidance. Performed at [facility], [address] [place], [place] [address]   Lactic acid, plasma     Status: None   Collection Time: 11/17/20  8:55 PM  Result Value Ref Range   Lactic Acid, Venous 1.4 0.5 - 1.9 mmol/L    Comment: Performed at [facility], [address] [place], [place] [address]    CT ABDOMEN PELVIS WO  CONTRAST  Result Date: 11/17/2020 CLINICAL DATA:  Abdominal distension, constipation, short of breath EXAM: CT ABDOMEN AND PELVIS WITHOUT CONTRAST TECHNIQUE: Multidetector CT imaging of the abdomen and pelvis was performed following the standard protocol without IV contrast. COMPARISON:  11/17/2020 FINDINGS: Lower chest: There is bibasilar airspace disease greatest within the lower lobes, left greater than right. Findings could reflect edema or infection. No effusion or pneumothorax. Unenhanced CT was performed per clinician order. Lack of IV contrast limits sensitivity and specificity, especially for evaluation of abdominal/pelvic solid viscera. Hepatobiliary: No focal liver abnormality is seen. Status post cholecystectomy. No biliary dilatation. Pancreas: Unremarkable. No pancreatic ductal dilatation or surrounding inflammatory changes. Spleen: Normal in size without focal abnormality. Adrenals/Urinary Tract: No urinary tract calculi or obstructive uropathy. The adrenals and bladder are unremarkable. Stomach/Bowel: There is distension of the stomach and proximal small bowel, with numerous proximal small bowel gas fluid levels, consistent with obstruction. There is change in caliber within the distal jejunum within the right lower quadrant. A stable Richter type hernia within the central upper abdomen is identified, with protrusion of the anti mesenteric aspect of the mid transverse colonic wall through the hernia defect. No wall thickening or fat stranding to suggest incarceration. Vascular/Lymphatic: Aortic atherosclerosis. No enlarged abdominal or pelvic lymph nodes. Reproductive: Continued marked enlargement of the prostate, which measures 5.8 x 6.1 x 6.0 cm. Other: No free fluid or free gas. Richter type hernia in the central upper abdomen as described above. Musculoskeletal: No acute or destructive bony lesions. Reconstructed images demonstrate no additional findings. IMPRESSION: 1. Small bowel obstruction,  with transition in the distal jejunum right lower quadrant. 2. Bibasilar airspace disease, greatest in the left lower lobe. Favor infection over edema. 3. Richter hernia central upper abdomen, with protrusion of the anti mesenteric wall of the mid transverse colon through the hernia defect. No incarceration. 4. Marked enlargement of the prostate. 5.  Aortic Atherosclerosis (ICD10-I70.0). Electronically Signed   By: Randa Ngo M.D.   On: 11/17/2020 03:39   DG Chest 1 View  Result Date: 11/17/2020 CLINICAL DATA:  Nasogastric tube insertion EXAM: CHEST  1 VIEW COMPARISON:  Chest x-ray earlier today FINDINGS: The enteric tube tip is at the stomach with side port near the GE junction. Opacity in the lower lungs as seen on preceding CT. Normal heart size. Presumed thyroidectomy. No visible effusion or pneumothorax IMPRESSION: 1. Enteric tube with tip at the stomach and side port near the GE junction. 2. Bilateral pulmonary infiltrate, question aspiration given the degree of gastric distension on prior. Electronically Signed   By: Monte Fantasia M.D.   On: 11/17/2020 04:33   NM Pulmonary Perfusion  Result Date: 11/17/2020 CLINICAL DATA:  Shortness of breath. EXAM: NUCLEAR MEDICINE PERFUSION LUNG SCAN TECHNIQUE: Perfusion images were obtained in multiple projections after intravenous injection of radiopharmaceutical. Ventilation scans intentionally deferred if perfusion scan and chest  x-ray adequate for interpretation during COVID 19 epidemic. RADIOPHARMACEUTICALS:  4.0 mCi Tc-36m MAA IV COMPARISON:  CT abdomen/pelvis 11/17/2020 and chest x-ray 11/17/2020. FINDINGS: There is a large area of decreased perfusion involving the superior segment of the left lower lobe. This corresponds to a large infiltrate seen best on the prior CT scan. I do not see any findings suspicious for pulmonary embolism. IMPRESSION: Left-sided perfusion defect matching a large infiltrate on recent chest x-ray and CT scan. No findings  suspicious for pulmonary embolism. Electronically Signed   By: Marijo Sanes M.D.   On: 11/17/2020 13:10   DG Chest Portable 1 View  Result Date: 11/17/2020 CLINICAL DATA:  Short of breath, rales EXAM: PORTABLE CHEST 1 VIEW COMPARISON:  07/20/2015 FINDINGS: Single frontal view of the chest demonstrates a stable cardiac silhouette. There is prominence of the central pulmonary vasculature, without acute airspace disease, effusion, or pneumothorax. Postsurgical changes are seen from thyroidectomy. No acute bony abnormalities. IMPRESSION: 1. Increased central vascular congestion without overt edema. Electronically Signed   By: Randa Ngo M.D.   On: 11/17/2020 02:21   ECHOCARDIOGRAM COMPLETE  Result Date: 11/17/2020    ECHOCARDIOGRAM REPORT   Patient Name:   CARMINE CARROZZA Date of Exam: 11/17/2020 Medical Rec #:  [id number]        Height:       63.0 in Accession #:    [id number]       Weight:       170.6 lb Date of Birth:  1933/11/14        BSA:          1.807 m Patient Age:    41 years         BP:           139/68 mmHg Patient Gender: M                HR:           98 bpm. Exam Location:  [facility] Procedure: 2D Echo Indications:    CHF-Acute Diastolic K93.81  History:        Patient has no prior history of Echocardiogram examinations.                 Signs/Symptoms:Fever and Syncope; Risk Factors:Former Smoker.                 Acute Respiratory Failure, GERD, Sepsis.  Sonographer:    Leavy Cella RDCS (AE) Referring Phys: [id number] OLADAPO ADEFESO IMPRESSIONS  1. Poor acoustic windows No definite wall motion abnormalities but endocardium is difficult to see. . Left ventricular ejection fraction, by estimation, is 55 to 60%. The left ventricle has normal function. There is mild left ventricular hypertrophy. Left ventricular diastolic parameters are indeterminate.  2. Right ventricular systolic function is normal. The right ventricular size is normal.  3. The mitral valve is normal in structure. Trivial  mitral valve regurgitation.  4. The aortic valve is tricuspid. Aortic valve regurgitation is not visualized. Mild to moderate aortic valve sclerosis/calcification is present, without any evidence of aortic stenosis.  5. The inferior vena cava is normal in size with greater than 50% respiratory variability, suggesting right atrial pressure of 3 mmHg. FINDINGS  Left Ventricle: Poor acoustic windows No definite wall motion abnormalities but endocardium is difficult to see. Left ventricular ejection fraction, by estimation, is 55 to 60%. The left ventricle has normal function. The left ventricular internal cavity size was normal in size. There is mild left ventricular  hypertrophy. Left ventricular diastolic parameters are indeterminate. Right Ventricle: The right ventricular size is normal. Right vetricular wall thickness was not assessed. Right ventricular systolic function is normal. Left Atrium: Left atrial size was normal in size. Right Atrium: Right atrial size was normal in size. Pericardium: There is no evidence of pericardial effusion. Mitral Valve: The mitral valve is normal in structure. There is mild thickening of the mitral valve leaflet(s). Mild mitral annular calcification. Trivial mitral valve regurgitation. Tricuspid Valve: The tricuspid valve is grossly normal. Tricuspid valve regurgitation is not demonstrated. Aortic Valve: The aortic valve is tricuspid. Aortic valve regurgitation is not visualized. Mild to moderate aortic valve sclerosis/calcification is present, without any evidence of aortic stenosis. Pulmonic Valve: The pulmonic valve was normal in structure. Pulmonic valve regurgitation is not visualized. Aorta: The aortic root is normal in size and structure. Venous: The inferior vena cava is normal in size with greater than 50% respiratory variability, suggesting right atrial pressure of 3 mmHg. IAS/Shunts: The interatrial septum was not assessed.  LEFT VENTRICLE PLAX 2D LVIDd:         3.57 cm   Diastology LVIDs:         2.39 cm  LV e' medial:    11.20 cm/s LV PW:         1.06 cm  LV E/e' medial:  11.5 LV IVS:        1.28 cm  LV e' lateral:   16.60 cm/s LVOT diam:     1.80 cm  LV E/e' lateral: 7.8 LVOT Area:     2.54 cm  RIGHT VENTRICLE RV S prime:     11.80 cm/s TAPSE (M-mode): 1.1 cm LEFT ATRIUM             Index       RIGHT ATRIUM          Index LA diam:        2.50 cm 1.38 cm/m  RA Area:     8.84 cm LA Vol (A2C):   41.6 ml 23.02 ml/m RA Volume:   16.30 ml 9.02 ml/m LA Vol (A4C):   30.7 ml 16.99 ml/m LA Biplane Vol: 37.5 ml 20.75 ml/m   AORTA Ao Root diam: 3.40 cm MITRAL VALVE MV Area (PHT): 3.85 cm     SHUNTS MV Decel Time: 197 msec     Systemic Diam: 1.80 cm MV E velocity: 129.00 cm/s Dorris Carnes MD Electronically signed by Dorris Carnes MD Signature Date/Time: 11/17/2020/6:42:44 PM    Final     ROS - all of the below systems have been reviewed with the patient and positives are indicated with bold text General: chills, fever or night sweats Eyes: blurry vision or double vision ENT: epistaxis or sore throat Allergy/Immunology: itchy/watery eyes or nasal congestion Hematologic/Lymphatic: bleeding problems, blood clots or swollen lymph nodes Endocrine: temperature intolerance or unexpected weight changes Breast: new or changing breast lumps or nipple discharge Resp: cough, shortness of breath, or wheezing CV: chest pain or dyspnea on exertion GI: as per HPI GU: dysuria, trouble voiding, or hematuria MSK: joint pain or joint stiffness Neuro: TIA or stroke symptoms Derm: pruritus and skin lesion changes Psych: anxiety and depression  PE Blood pressure 139/66, pulse 91, temperature 98.1 F (36.7 C), temperature source Oral, resp. rate 16, height 5\' 3"  (1.6 m), weight 77.4 kg, SpO2 93 %. Constitutional: NAD; conversant; no deformities Eyes: Moist conjunctiva; no lid lag; anicteric; pupils equal and round Neck: Trachea midline; no thyromegaly Lungs: Normal  respiratory effort; no  tactile fremitus CV: RRR; no palpable thrills; no pitting edema GI: Abd soft; midline hernia soft and reduces but immediately recurs; no palpable hepatosplenomegaly. No tenderness throughout. Moderately distended. No rebound nor guarding. MSK: Normal range of motion of extremities; no clubbing/cyanosis Psychiatric: Appropriate affect; alert and oriented x3 Lymphatic: No palpable cervical or axillary lymphadenopathy  Results for orders placed or performed during the hospital encounter of 11/17/20 (from the past 48 hour(s))  Resp Panel by RT-PCR (Flu A&B, Covid) Nasopharyngeal Swab     Status: None   Collection Time: 11/17/20  2:00 AM   Specimen: Nasopharyngeal Swab; Nasopharyngeal(NP) swabs in vial transport medium  Result Value Ref Range   SARS Coronavirus 2 by RT PCR NEGATIVE NEGATIVE    Comment: (NOTE) SARS-CoV-2 target nucleic acids are NOT DETECTED.  The SARS-CoV-2 RNA is generally detectable in upper respiratory specimens during the acute phase of infection. The lowest concentration of SARS-CoV-2 viral copies this assay can detect is 138 copies/mL. A negative result does not preclude SARS-Cov-2 infection and should not be used as the sole basis for treatment or other patient management decisions. A negative result may occur with  improper specimen collection/handling, submission of specimen other than nasopharyngeal swab, presence of viral mutation(s) within the areas targeted by this assay, and inadequate number of viral copies(<138 copies/mL). A negative result must be combined with clinical observations, patient history, and epidemiological information. The expected result is Negative.  Fact Sheet for Patients:  EntrepreneurPulse.com.au  Fact Sheet for Healthcare Providers:  IncredibleEmployment.be  This test is no t yet approved or cleared by the [place] FDA and  has been authorized for detection and/or diagnosis of SARS-CoV-2  by FDA under an Emergency Use Authorization (EUA). This EUA will remain  in effect (meaning this test can be used) for the duration of the COVID-19 declaration under Section 564(b)(1) of the Act, 21 U.S.C.section 360bbb-3(b)(1), unless the authorization is terminated  or revoked sooner.       Influenza A by PCR NEGATIVE NEGATIVE   Influenza B by PCR NEGATIVE NEGATIVE    Comment: (NOTE) The Xpert Xpress SARS-CoV-2/FLU/RSV plus assay is intended as an aid in the diagnosis of influenza from Nasopharyngeal swab specimens and should not be used as a sole basis for treatment. Nasal washings and aspirates are unacceptable for Xpert Xpress SARS-CoV-2/FLU/RSV testing.  Fact Sheet for Patients: EntrepreneurPulse.com.au  Fact Sheet for Healthcare Providers: IncredibleEmployment.be  This test is not yet approved or cleared by the [place] FDA and has been authorized for detection and/or diagnosis of SARS-CoV-2 by FDA under an Emergency Use Authorization (EUA). This EUA will remain in effect (meaning this test can be used) for the duration of the COVID-19 declaration under Section 564(b)(1) of the Act, 21 U.S.C. section 360bbb-3(b)(1), unless the authorization is terminated or revoked.  Performed at [facility], [address], [place], [place] [address]   CBC with Differential     Status: Abnormal   Collection Time: 11/17/20  2:03 AM  Result Value Ref Range   WBC 11.2 (H) 4.0 - 10.5 K/uL   RBC 5.57 4.22 - 5.81 MIL/uL   Hemoglobin 14.1 13.0 - 17.0 g/dL   HCT 46.4 39.0 - 52.0 %   MCV 83.3 80.0 - 100.0 fL   MCH 25.3 (L) 26.0 - 34.0 pg   MCHC 30.4 30.0 - 36.0 g/dL   RDW 16.2 (H) 11.5 - 15.5 %   Platelets 260 150 - 400 K/uL   nRBC 0.0  0.0 - 0.2 %   Neutrophils Relative % 87 %   Neutro Abs 9.7 (H) 1.7 - 7.7 K/uL   Lymphocytes Relative 10 %   Lymphs Abs 1.2 0.7 - 4.0 K/uL   Monocytes Relative 3 %   Monocytes Absolute 0.3 0.1 - 1.0 K/uL    Eosinophils Relative 0 %   Eosinophils Absolute 0.0 0.0 - 0.5 K/uL   Basophils Relative 0 %   Basophils Absolute 0.0 0.0 - 0.1 K/uL   Immature Granulocytes 0 %   Abs Immature Granulocytes 0.02 0.00 - 0.07 K/uL    Comment: Performed at [facility], [address], [place], [place] [address]  Comprehensive metabolic panel     Status: Abnormal   Collection Time: 11/17/20  2:03 AM  Result Value Ref Range   Sodium 138 135 - 145 mmol/L   Potassium 2.8 (L) 3.5 - 5.1 mmol/L   Chloride 98 98 - 111 mmol/L   CO2 19 (L) 22 - 32 mmol/L   Glucose, Bld 259 (H) 70 - 99 mg/dL    Comment: Glucose reference range applies only to samples taken after fasting for at least 8 hours.   BUN 33 (H) 8 - 23 mg/dL   Creatinine, Ser 2.06 (H) 0.61 - 1.24 mg/dL   Calcium 9.0 8.9 - 10.3 mg/dL   Total Protein 7.8 6.5 - 8.1 g/dL   Albumin 3.8 3.5 - 5.0 g/dL   AST 29 15 - 41 U/L   ALT 27 0 - 44 U/L    Comment: RESULTS CONFIRMED BY MANUAL DILUTION   Alkaline Phosphatase 116 38 - 126 U/L   Total Bilirubin 0.7 0.3 - 1.2 mg/dL   GFR, Estimated 31 (L) >60 mL/min    Comment: (NOTE) Calculated using the CKD-EPI Creatinine Equation (2021)    Anion gap 21 (H) 5 - 15    Comment: Performed at [facility], [address] [place], [place] [address]  Brain natriuretic peptide     Status: Abnormal   Collection Time: 11/17/20  2:03 AM  Result Value Ref Range   B Natriuretic Peptide 203.0 (H) 0.0 - 100.0 pg/mL    Comment: Performed at [facility], [address], [place], [place] [address]  Troponin I (High Sensitivity)     Status: Abnormal   Collection Time: 11/17/20  2:03 AM  Result Value Ref Range   Troponin I (High Sensitivity) 144 (HH) <18 ng/L    Comment: CRITICAL RESULT CALLED TO, READ BACK BY AND VERIFIED WITH: S LAMBERT,RN@0302  11/17/20 MKELLY (NOTE) Elevated high sensitivity troponin I (hsTnI) values and significant  changes across serial measurements may suggest ACS but many other  chronic and acute conditions  are known to elevate hsTnI results.  Refer to the Links section for chest pain algorithms and additional  guidance. Performed at [facility], [address], [place], [place] [address]   D-dimer, quantitative     Status: Abnormal   Collection Time: 11/17/20  2:03 AM  Result Value Ref Range   D-Dimer, Quant 2.73 (H) 0.00 - 0.50 ug/mL-FEU    Comment: (NOTE) At the manufacturer cut-off value of 0.5 g/mL FEU, this assay has a negative predictive value of 95-100%.This assay is intended for use in conjunction with a clinical pretest probability (PTP) assessment model to exclude pulmonary embolism (PE) and deep venous thrombosis (DVT) in outpatients suspected of PE or DVT. Results should be correlated with clinical presentation. Performed at [facility], [address], [place],  [address]   Magnesium  Status: None   Collection Time: 11/17/20  2:03 AM  Result Value Ref Range   Magnesium 2.0 1.7 - 2.4 mg/dL    Comment: Performed at [facility], [address], [place], [place] [address]  Procalcitonin - Baseline     Status: None   Collection Time: 11/17/20  2:03 AM  Result Value Ref Range   Procalcitonin 0.38 ng/mL    Comment:        Interpretation: PCT (Procalcitonin) <= 0.5 ng/mL: Systemic infection (sepsis) is not likely. Local bacterial infection is possible. (NOTE)       Sepsis PCT Algorithm           Lower Respiratory Tract                                      Infection PCT Algorithm    ----------------------------     ----------------------------         PCT < 0.25 ng/mL                PCT < 0.10 ng/mL          Strongly encourage             Strongly discourage   discontinuation of antibiotics    initiation of antibiotics    ----------------------------     -----------------------------       PCT 0.25 - 0.50 ng/mL            PCT 0.10 - 0.25 ng/mL               OR       >80% decrease in PCT            Discourage initiation of                                             antibiotics      Encourage discontinuation           of antibiotics    ----------------------------     -----------------------------         PCT >= 0.50 ng/mL              PCT 0.26 - 0.50 ng/mL               AND        <80% decrease in PCT             Encourage initiation of                                             antibiotics       Encourage continuation           of antibiotics    ----------------------------     -----------------------------        PCT >= 0.50 ng/mL                  PCT > 0.50 ng/mL               AND         increase in PCT  Strongly encourage                                      initiation of antibiotics    Strongly encourage escalation           of antibiotics                                     -----------------------------                                           PCT <= 0.25 ng/mL                                                 OR                                        > 80% decrease in PCT                                      Discontinue / Do not initiate                                             antibiotics  Performed at [facility], [address] [place], [place] [address]   Hemoglobin A1c     Status: Abnormal   Collection Time: 11/17/20  2:03 AM  Result Value Ref Range   Hgb A1c MFr Bld 6.6 (H) 4.8 - 5.6 %    Comment: (NOTE) Pre diabetes:          5.7%-6.4%  Diabetes:              >6.4%  Glycemic control for   <7.0% adults with diabetes    Mean Plasma Glucose 142.72 mg/dL    Comment: Performed at [facility] [address] [place], [place] [address]  Troponin I (High Sensitivity)     Status: Abnormal   Collection Time: 11/17/20  3:30 AM  Result Value Ref Range   Troponin I (High Sensitivity) 223 (HH) <18 ng/L    Comment: CRITICAL RESULT CALLED TO, READ BACK BY AND VERIFIED WITH: LAMBERT,S AT 4:35AM ON 11/17/20 BY FESTERMAN,C (NOTE) Elevated high sensitivity troponin I (hsTnI) values and significant   changes across serial measurements may suggest ACS but many other  chronic and acute conditions are known to elevate hsTnI results.  Refer to the Links section for chest pain algorithms and additional  guidance. Performed at [facility], [address], [place], [place] [address]   Lactic acid, plasma     Status: Abnormal   Collection Time: 11/17/20  5:04 AM  Result Value Ref Range   Lactic Acid, Venous 5.8 (HH) 0.5 - 1.9 mmol/L    Comment: CRITICAL RESULT CALLED TO, READ BACK BY AND VERIFIED WITH:  [facility] AT 5:30AM ON 11/17/20 BY [facility] Performed at [facility], [address], [place], [place] [address]   Blood culture (routine x 2)     Status: None (Preliminary result)   Collection Time: 11/17/20  5:05 AM   Specimen: BLOOD  Result Value Ref Range   Specimen Description BLOOD RIGHT ANTECUBITAL    Special Requests      BOTTLES DRAWN AEROBIC AND ANAEROBIC Blood Culture adequate volume   Culture      NO GROWTH < 12 HOURS Performed at [facility], [address] [place], [place] [address]    Report Status PENDING   Blood culture (routine x 2)     Status: None (Preliminary result)   Collection Time: 11/17/20  5:10 AM   Specimen: BLOOD  Result Value Ref Range   Specimen Description BLOOD LEFT ANTECUBITAL    Special Requests      BOTTLES DRAWN AEROBIC AND ANAEROBIC Blood Culture adequate volume   Culture      NO GROWTH < 12 HOURS Performed at [facility], [address], [place], [place] [address]    Report Status PENDING   Occult bld gastric/duodenum (cup to lab)     Status: None   Collection Time: 11/17/20  5:22 AM  Result Value Ref Range   pH, Gastric 4    Occult Blood, Gastric NEGATIVE NEGATIVE    Comment: Performed at [facility], [address] [place], [place] [address]  Lactic acid, plasma     Status: Abnormal   Collection Time: 11/17/20  6:24 AM  Result Value Ref Range   Lactic Acid, Venous 4.6 (HH) 0.5 - 1.9 mmol/L    Comment: CRITICAL VALUE NOTED.   VALUE IS CONSISTENT WITH PREVIOUSLY REPORTED AND CALLED VALUE. Performed at [facility], [address], [place], [place] [address]   Troponin I (High Sensitivity)     Status: Abnormal   Collection Time: 11/17/20  7:52 AM  Result Value Ref Range   Troponin I (High Sensitivity) 373 (HH) <18 ng/L    Comment: CRITICAL RESULT CALLED TO, READ BACK BY AND VERIFIED WITH: HOLCOMB,R 1884 11/17/2020 COLEMAN,R (NOTE) Elevated high sensitivity troponin I (hsTnI) values and significant  changes across serial measurements may suggest ACS but many other  chronic and acute conditions are known to elevate hsTnI results.  Refer to the Links section for chest pain algorithms and additional  guidance. Performed at [facility], [address], [place], [place] [address]   Lactic acid, plasma     Status: Abnormal   Collection Time: 11/17/20  9:14 AM  Result Value Ref Range   Lactic Acid, Venous 3.8 (HH) 0.5 - 1.9 mmol/L    Comment: CRITICAL VALUE NOTED.  VALUE IS CONSISTENT WITH PREVIOUSLY REPORTED AND CALLED VALUE. Performed at [facility], [address] [place], [place] [address]   Troponin I (High Sensitivity)     Status: Abnormal   Collection Time: 11/17/20  9:14 AM  Result Value Ref Range   Troponin I (High Sensitivity) 344 (HH) <18 ng/L    Comment: CRITICAL VALUE NOTED.  VALUE IS CONSISTENT WITH PREVIOUSLY REPORTED AND CALLED VALUE. (NOTE) Elevated high sensitivity troponin I (hsTnI) values and significant  changes across serial measurements may suggest ACS but many other  chronic and acute conditions are known to elevate hsTnI results.  Refer to the Links section for chest pain algorithms and additional  guidance. Performed at [facility], [address], [place],  [address]   Heparin level (unfractionated)  Status: Abnormal   Collection Time: 11/17/20 12:26 PM  Result Value Ref Range   Heparin Unfractionated 0.80 (H) 0.30 - 0.70 IU/mL    Comment: (NOTE) If heparin results are  below expected values, and patient dosage has  been confirmed, suggest follow up testing of antithrombin III levels. Performed at [facility], [address], [place], [place] [address]   Lactic acid, plasma     Status: Abnormal   Collection Time: 11/17/20 12:27 PM  Result Value Ref Range   Lactic Acid, Venous 2.7 (HH) 0.5 - 1.9 mmol/L    Comment: CRITICAL VALUE NOTED.  VALUE IS CONSISTENT WITH PREVIOUSLY REPORTED AND CALLED VALUE. Performed at [facility], [address], [place], [place] [address]   Troponin I (High Sensitivity)     Status: Abnormal   Collection Time: 11/17/20  8:55 PM  Result Value Ref Range   Troponin I (High Sensitivity) 157 (HH) <18 ng/L    Comment: CRITICAL RESULT CALLED TO, READ BACK BY AND VERIFIED WITH: THOMPSON,R ON 11/17/20 AT 2210 BY LOY,C (NOTE) Elevated high sensitivity troponin I (hsTnI) values and significant  changes across serial measurements may suggest ACS but many other  chronic and acute conditions are known to elevate hsTnI results.  Refer to the Links section for chest pain algorithms and additional  guidance. Performed at [facility], [address] [place], [place] [address]   Lactic acid, plasma     Status: None   Collection Time: 11/17/20  8:55 PM  Result Value Ref Range   Lactic Acid, Venous 1.4 0.5 - 1.9 mmol/L    Comment: Performed at [facility], [address] [place], [place] [address]    CT ABDOMEN PELVIS WO CONTRAST  Result Date: 11/17/2020 CLINICAL DATA:  Abdominal distension, constipation, short of breath EXAM: CT ABDOMEN AND PELVIS WITHOUT CONTRAST TECHNIQUE: Multidetector CT imaging of the abdomen and pelvis was performed following the standard protocol without IV contrast. COMPARISON:  11/17/2020 FINDINGS: Lower chest: There is bibasilar airspace disease greatest within the lower lobes, left greater than right. Findings could reflect edema or infection. No effusion or pneumothorax. Unenhanced CT was performed per clinician  order. Lack of IV contrast limits sensitivity and specificity, especially for evaluation of abdominal/pelvic solid viscera. Hepatobiliary: No focal liver abnormality is seen. Status post cholecystectomy. No biliary dilatation. Pancreas: Unremarkable. No pancreatic ductal dilatation or surrounding inflammatory changes. Spleen: Normal in size without focal abnormality. Adrenals/Urinary Tract: No urinary tract calculi or obstructive uropathy. The adrenals and bladder are unremarkable. Stomach/Bowel: There is distension of the stomach and proximal small bowel, with numerous proximal small bowel gas fluid levels, consistent with obstruction. There is change in caliber within the distal jejunum within the right lower quadrant. A stable Richter type hernia within the central upper abdomen is identified, with protrusion of the anti mesenteric aspect of the mid transverse colonic wall through the hernia defect. No wall thickening or fat stranding to suggest incarceration. Vascular/Lymphatic: Aortic atherosclerosis. No enlarged abdominal or pelvic lymph nodes. Reproductive: Continued marked enlargement of the prostate, which measures 5.8 x 6.1 x 6.0 cm. Other: No free fluid or free gas. Richter type hernia in the central upper abdomen as described above. Musculoskeletal: No acute or destructive bony lesions. Reconstructed images demonstrate no additional findings. IMPRESSION: 1. Small bowel obstruction, with transition in the distal jejunum right lower quadrant. 2. Bibasilar airspace disease, greatest in the left lower lobe. Favor infection over edema. 3. Richter hernia central upper abdomen, with protrusion of the anti  mesenteric wall of the mid transverse colon through the hernia defect. No incarceration. 4. Marked enlargement of the prostate. 5.  Aortic Atherosclerosis (ICD10-I70.0). Electronically Signed   By: Randa Ngo M.D.   On: 11/17/2020 03:39   DG Chest 1 View  Result Date: 11/17/2020 CLINICAL DATA:   Nasogastric tube insertion EXAM: CHEST  1 VIEW COMPARISON:  Chest x-ray earlier today FINDINGS: The enteric tube tip is at the stomach with side port near the GE junction. Opacity in the lower lungs as seen on preceding CT. Normal heart size. Presumed thyroidectomy. No visible effusion or pneumothorax IMPRESSION: 1. Enteric tube with tip at the stomach and side port near the GE junction. 2. Bilateral pulmonary infiltrate, question aspiration given the degree of gastric distension on prior. Electronically Signed   By: Monte Fantasia M.D.   On: 11/17/2020 04:33   NM Pulmonary Perfusion  Result Date: 11/17/2020 CLINICAL DATA:  Shortness of breath. EXAM: NUCLEAR MEDICINE PERFUSION LUNG SCAN TECHNIQUE: Perfusion images were obtained in multiple projections after intravenous injection of radiopharmaceutical. Ventilation scans intentionally deferred if perfusion scan and chest x-ray adequate for interpretation during COVID 19 epidemic. RADIOPHARMACEUTICALS:  4.0 mCi Tc-68m MAA IV COMPARISON:  CT abdomen/pelvis 11/17/2020 and chest x-ray 11/17/2020. FINDINGS: There is a large area of decreased perfusion involving the superior segment of the left lower lobe. This corresponds to a large infiltrate seen best on the prior CT scan. I do not see any findings suspicious for pulmonary embolism. IMPRESSION: Left-sided perfusion defect matching a large infiltrate on recent chest x-ray and CT scan. No findings suspicious for pulmonary embolism. Electronically Signed   By: Marijo Sanes M.D.   On: 11/17/2020 13:10   DG Chest Portable 1 View  Result Date: 11/17/2020 CLINICAL DATA:  Short of breath, rales EXAM: PORTABLE CHEST 1 VIEW COMPARISON:  07/20/2015 FINDINGS: Single frontal view of the chest demonstrates a stable cardiac silhouette. There is prominence of the central pulmonary vasculature, without acute airspace disease, effusion, or pneumothorax. Postsurgical changes are seen from thyroidectomy. No acute bony  abnormalities. IMPRESSION: 1. Increased central vascular congestion without overt edema. Electronically Signed   By: Randa Ngo M.D.   On: 11/17/2020 02:21   ECHOCARDIOGRAM COMPLETE  Result Date: 11/17/2020    ECHOCARDIOGRAM REPORT   Patient Name:   DAYMOND CORDTS Date of Exam: 11/17/2020 Medical Rec #:  [id number]        Height:       63.0 in Accession #:    [id number]       Weight:       170.6 lb Date of Birth:  Apr 18, 1934        BSA:          1.807 m Patient Age:    19 years         BP:           139/68 mmHg Patient Gender: M                HR:           98 bpm. Exam Location:  [facility] Procedure: 2D Echo Indications:    CHF-Acute Diastolic O53.66  History:        Patient has no prior history of Echocardiogram examinations.                 Signs/Symptoms:Fever and Syncope; Risk Factors:Former Smoker.                 Acute Respiratory Failure,  GERD, Sepsis.  Sonographer:    Leavy Cella RDCS (AE) Referring Phys: [id number] OLADAPO ADEFESO IMPRESSIONS  1. Poor acoustic windows No definite wall motion abnormalities but endocardium is difficult to see. . Left ventricular ejection fraction, by estimation, is 55 to 60%. The left ventricle has normal function. There is mild left ventricular hypertrophy. Left ventricular diastolic parameters are indeterminate.  2. Right ventricular systolic function is normal. The right ventricular size is normal.  3. The mitral valve is normal in structure. Trivial mitral valve regurgitation.  4. The aortic valve is tricuspid. Aortic valve regurgitation is not visualized. Mild to moderate aortic valve sclerosis/calcification is present, without any evidence of aortic stenosis.  5. The inferior vena cava is normal in size with greater than 50% respiratory variability, suggesting right atrial pressure of 3 mmHg. FINDINGS  Left Ventricle: Poor acoustic windows No definite wall motion abnormalities but endocardium is difficult to see. Left ventricular ejection fraction, by  estimation, is 55 to 60%. The left ventricle has normal function. The left ventricular internal cavity size was normal in size. There is mild left ventricular hypertrophy. Left ventricular diastolic parameters are indeterminate. Right Ventricle: The right ventricular size is normal. Right vetricular wall thickness was not assessed. Right ventricular systolic function is normal. Left Atrium: Left atrial size was normal in size. Right Atrium: Right atrial size was normal in size. Pericardium: There is no evidence of pericardial effusion. Mitral Valve: The mitral valve is normal in structure. There is mild thickening of the mitral valve leaflet(s). Mild mitral annular calcification. Trivial mitral valve regurgitation. Tricuspid Valve: The tricuspid valve is grossly normal. Tricuspid valve regurgitation is not demonstrated. Aortic Valve: The aortic valve is tricuspid. Aortic valve regurgitation is not visualized. Mild to moderate aortic valve sclerosis/calcification is present, without any evidence of aortic stenosis. Pulmonic Valve: The pulmonic valve was normal in structure. Pulmonic valve regurgitation is not visualized. Aorta: The aortic root is normal in size and structure. Venous: The inferior vena cava is normal in size with greater than 50% respiratory variability, suggesting right atrial pressure of 3 mmHg. IAS/Shunts: The interatrial septum was not assessed.  LEFT VENTRICLE PLAX 2D LVIDd:         3.57 cm  Diastology LVIDs:         2.39 cm  LV e' medial:    11.20 cm/s LV PW:         1.06 cm  LV E/e' medial:  11.5 LV IVS:        1.28 cm  LV e' lateral:   16.60 cm/s LVOT diam:     1.80 cm  LV E/e' lateral: 7.8 LVOT Area:     2.54 cm  RIGHT VENTRICLE RV S prime:     11.80 cm/s TAPSE (M-mode): 1.1 cm LEFT ATRIUM             Index       RIGHT ATRIUM          Index LA diam:        2.50 cm 1.38 cm/m  RA Area:     8.84 cm LA Vol (A2C):   41.6 ml 23.02 ml/m RA Volume:   16.30 ml 9.02 ml/m LA Vol (A4C):   30.7 ml  16.99 ml/m LA Biplane Vol: 37.5 ml 20.75 ml/m   AORTA Ao Root diam: 3.40 cm MITRAL VALVE MV Area (PHT): 3.85 cm     SHUNTS MV Decel Time: 197 msec     Systemic Diam: 1.80 cm MV E  velocity: 129.00 cm/s Dorris Carnes MD Electronically signed by Dorris Carnes MD Signature Date/Time: 11/17/2020/6:42:44 PM    Final    I have reviewed his CT imaging  A/P: TAVIEN CHESTNUT is an 87 y.o. male with SBO - his small ventral hernia is reducible  -NPO, MIVF, NGT to LIWS -SBO protocol -We will follow with you - I spent time discussing his diagnosis and plan moving forward, answering questions. He is in agreement  [employer]. Dema Severin, M.D. [facility] Use [email or web address] to contact on call provider

## 2020-11-17 NOTE — H&P (Addendum)
History and Physical  Victor Hunter BJY:[id number] DOB: 1933/10/11 DOA: 11/17/2020  Referring physician: Merryl Hacker, MD PCP: Rosita Fire, MD  Patient coming from: Home  Chief Complaint: Shortness of breath  HPI: Victor Hunter is a 87 y.o. male with medical history significant for hypertension, esophagitis, BPH who presents to the emergency department via EMS due to sudden onset of shortness of breath which started yesterday in the evening.  Patient complained of constipation with last bowel movement about 4 days ago and 2 days of slowly distended abdomen, so he presented to an urgent care yesterday and was prescribed with medication for constipation (patient does not remember name of the medication) without any bowel movement, abdomen continues to be distended, to the extent that he started to have increased work of breathing and shortness of breath, so EMS was activated.  On arrival of EMS, patient was noted to be hypoxic with an O2 sat of 78% on room air, supplemental oxygen was provided in route to the ED and this was transitioned to HFNC at 15 L on arrival to the ED.  ED Course:  In the emergency department, he was tachypneic and intermittently tachycardic, BP was 100/62 in the ED showed leukocytosis, hypokalemia, hyperglycemia, BUN/creatinine 33/2.06 (most recent creatinine for comparison was 5 years ago and it was 1.40).  Troponin x2-144 > 223.  D-dimer 2.73, lactic acid 5.8.  SARS coronavirus 2 was negative. Chest x-ray was done and showed increased central vascular congestion without overt edema. CT abdomen and pelvis without contrast showed small bowel obstruction, with transition in the distal jejunum right lower quadrant.  Bibasilar airspace disease greatest in the left lower lobe, favor infection of edema. NG tube was placed and 800 cc of possible coffee-ground fluid like (but with negative Gastroccult) obtained in the canister.  BP was soft and patient was prior with IV  hydration, IV heparin drip was started due to elevated troponin.  Aspirin 325 mg x 1 was given and patient was started on IV Vanco and Zosyn.  Potassium was replenished.  General surgery (Dr. Constance Haw) was consulted and suggested that patient will be better served if admitted to [facility] due to respiratory/cardiac status as of the time of being examined the ED per ED physician. Hospitalist was asked to admit patient for further evaluation and management. While still in the ED, BP dropped to 80/45 with a MAP of 57, IV hydration per sepsis protocol was started   Review of Systems: Constitutional: Negative for chills and fever.  HENT: Negative for ear pain and sore throat.   Eyes: Negative for pain and visual disturbance.  Respiratory: Positive for shortness of breath.  Negative for cough, chest tightness  Cardiovascular: Negative for chest pain and palpitations.  Gastrointestinal: Positive for abdominal distention and constipation.  Negative for nausea and vomiting Endocrine: Negative for polyphagia and polyuria.  Genitourinary: Negative for decreased urine volume, dysuria, enuresis Musculoskeletal: Negative for arthralgias and back pain.  Skin: Negative for color change and rash.  Allergic/Immunologic: Negative for immunocompromised state.  Neurological: Negative for tremors, syncope, speech difficulty Hematological: Does not bruise/bleed easily.  All other systems reviewed and are negative   Past Medical History:  Diagnosis Date   Blood transfusion without reported diagnosis    Chronic anemia    Enlarged prostate    Elevated PSA   Esophagitis    High grade squamous dysplasia occurring in the background of chronic active esophagitis,, EGD Dr. Christoper Fabian benign biopsies February 2005   GERD (  gastroesophageal reflux disease)    History of BPH    s/p TURP 2008   Hypertension    Ileus ([place])    Mallory - Weiss tear    Thyroid disease    Upper GI bleed    Past Surgical  History:  Procedure Laterality Date   CENTRAL VENOUS CATHETER INSERTION  04/26/2012   Procedure: INSERTION CENTRAL LINE ADULT;  Surgeon: Donato Heinz, MD;  Location: [facility];  Service: General;  Laterality: Left;  started at 0919, ended at 0930,        subclavian   CHOLECYSTECTOMY     COLONOSCOPY  10/29/03   rourk-inflammatory polyp from 40 cm removed   COLONOSCOPY  10/02/2012   Dr. Gala Romney: normal   ESOPHAGOGASTRODUODENOSCOPY  08/31/08   Rourk-possible extrinsic impression on midesophagus, noncritical Schatzki's ring, 3 mm fundal gland polyp, multiple gastric fundic polyps not manipulated, small hiatal hernia : CT chest showed mediastinal mass, eventually underwent thyroidectomy   ESOPHAGOGASTRODUODENOSCOPY  08/27/2012   Dr. Cristina Gong: moderate sized hiatal hernia, cameron lesions   LAPAROTOMY  04/26/2012   Procedure: EXPLORATORY LAPAROTOMY;  Surgeon: Donato Heinz, MD;  Location: [facility];  Service: General;  Laterality: N/A;   THYROIDECTOMY     TRANSURETHRAL RESECTION OF PROSTATE     pt denies, but this is listed in his past medical records.     Social History:  reports that he quit smoking about 28 years ago. His smoking use included cigarettes. He smoked 0.50 packs per day. He has never used smokeless tobacco. He reports that he does not drink alcohol and does not use drugs.   No Known Allergies  Family History  Problem Relation Age of Onset   Cancer Other    Heart failure Other    Liver disease Sister    Breast cancer Sister    Hypertension Mother    Stroke Father    Colon cancer Neg Hx      Prior to Admission medications   Medication Sig Start Date End Date Taking? Authorizing Provider  bisacodyl (DULCOLAX) 5 MG EC tablet Take 1 tablet (5 mg total) by mouth daily as needed for moderate constipation. 11/16/20   Wurst, [place], PA-C  diltiazem (DILACOR XR) 180 MG 24 hr capsule Take 180 mg by mouth daily.    [provider]  finasteride (PROSCAR) 5 MG  tablet TK 1 T PO D UTD 08/03/15   [provider]  HYDROcodone-acetaminophen (NORCO/VICODIN) 5-325 MG tablet Take 1 tablet by mouth every 6 (six) hours as needed for severe pain. 02/12/17   Carole Civil, MD  latanoprost (XALATAN) 0.005 % ophthalmic solution Place 1 drop into both eyes at bedtime. 01/23/17   [provider]  levothyroxine (SYNTHROID, LEVOTHROID) 112 MCG tablet Take 112 mcg by mouth daily before breakfast.    [provider]  pantoprazole (PROTONIX) 40 MG tablet Take 1 tablet (40 mg total) by mouth daily. 03/06/20   Avegno, Darrelyn Hillock, FNP  polyethylene glycol (MIRALAX / GLYCOLAX) 17 g packet Take 17 g by mouth daily. 11/16/20   Lestine Box, PA-C    Physical Exam: BP 113/62    Pulse 95    Temp 98.1 F (36.7 C) (Rectal)    Resp 20    Ht 5' 3"  (1.6 m)    Wt 77.4 kg    SpO2 95%    BMI 30.23 kg/m    General: 87 y.o. year-old male ill-appearing but in no acute distress.  Alert and oriented x3.  HEENT: NCAT, EOMI  Neck: Supple, trachea medial  Cardiovascular: Regular rate and rhythm with no rubs or gallops.  No thyromegaly or JVD noted.  No lower extremity edema. 2/4 pulses in all 4 extremities.  Respiratory: Tachypnea.  Clear to auscultation with no wheezes or rales. Good inspiratory effort.  Abdomen: Nontender, distended, positive ventral hernia noted. Normal bowel sounds x4 quadrants.  Muskuloskeletal: No cyanosis, clubbing or edema noted bilaterally  Neuro: CN II-XII intact, strength 5/5 x 4, sensation, reflexes intact  Skin: No ulcerative lesions noted or rashes  Psychiatry: Judgement and insight appear normal. Mood is appropriate for condition and setting          Labs on Admission:  Basic Metabolic Panel: Recent Labs  Lab 11/17/20 0203  NA 138  K 2.8*  CL 98  CO2 19*  GLUCOSE 259*  BUN 33*  CREATININE 2.06*  CALCIUM 9.0  MG 2.0   Liver Function Tests: Recent Labs  Lab 11/17/20 0203  AST 29  ALT 27  ALKPHOS 116   BILITOT 0.7  PROT 7.8  ALBUMIN 3.8   No results for input(s): LIPASE, AMYLASE in the last 168 hours. No results for input(s): AMMONIA in the last 168 hours. CBC: Recent Labs  Lab 11/17/20 0203  WBC 11.2*  NEUTROABS 9.7*  HGB 14.1  HCT 46.4  MCV 83.3  PLT 260   Cardiac Enzymes: No results for input(s): CKTOTAL, CKMB, CKMBINDEX, TROPONINI in the last 168 hours.  BNP (last 3 results) Recent Labs    11/17/20 0203  BNP 203.0*    ProBNP (last 3 results) No results for input(s): PROBNP in the last 8760 hours.  CBG: No results for input(s): GLUCAP in the last 168 hours.  Radiological Exams on Admission: CT ABDOMEN PELVIS WO CONTRAST  Result Date: 11/17/2020 CLINICAL DATA:  Abdominal distension, constipation, short of breath EXAM: CT ABDOMEN AND PELVIS WITHOUT CONTRAST TECHNIQUE: Multidetector CT imaging of the abdomen and pelvis was performed following the standard protocol without IV contrast. COMPARISON:  11/17/2020 FINDINGS: Lower chest: There is bibasilar airspace disease greatest within the lower lobes, left greater than right. Findings could reflect edema or infection. No effusion or pneumothorax. Unenhanced CT was performed per clinician order. Lack of IV contrast limits sensitivity and specificity, especially for evaluation of abdominal/pelvic solid viscera. Hepatobiliary: No focal liver abnormality is seen. Status post cholecystectomy. No biliary dilatation. Pancreas: Unremarkable. No pancreatic ductal dilatation or surrounding inflammatory changes. Spleen: Normal in size without focal abnormality. Adrenals/Urinary Tract: No urinary tract calculi or obstructive uropathy. The adrenals and bladder are unremarkable. Stomach/Bowel: There is distension of the stomach and proximal small bowel, with numerous proximal small bowel gas fluid levels, consistent with obstruction. There is change in caliber within the distal jejunum within the right lower quadrant. A stable Richter type  hernia within the central upper abdomen is identified, with protrusion of the anti mesenteric aspect of the mid transverse colonic wall through the hernia defect. No wall thickening or fat stranding to suggest incarceration. Vascular/Lymphatic: Aortic atherosclerosis. No enlarged abdominal or pelvic lymph nodes. Reproductive: Continued marked enlargement of the prostate, which measures 5.8 x 6.1 x 6.0 cm. Other: No free fluid or free gas. Richter type hernia in the central upper abdomen as described above. Musculoskeletal: No acute or destructive bony lesions. Reconstructed images demonstrate no additional findings. IMPRESSION: 1. Small bowel obstruction, with transition in the distal jejunum right lower quadrant. 2. Bibasilar airspace disease, greatest in the left lower lobe. Favor infection over edema. 3.  Richter hernia central upper abdomen, with protrusion of the anti mesenteric wall of the mid transverse colon through the hernia defect. No incarceration. 4. Marked enlargement of the prostate. 5.  Aortic Atherosclerosis (ICD10-I70.0). Electronically Signed   By: Randa Ngo M.D.   On: 11/17/2020 03:39   DG Chest 1 View  Result Date: 11/17/2020 CLINICAL DATA:  Nasogastric tube insertion EXAM: CHEST  1 VIEW COMPARISON:  Chest x-ray earlier today FINDINGS: The enteric tube tip is at the stomach with side port near the GE junction. Opacity in the lower lungs as seen on preceding CT. Normal heart size. Presumed thyroidectomy. No visible effusion or pneumothorax IMPRESSION: 1. Enteric tube with tip at the stomach and side port near the GE junction. 2. Bilateral pulmonary infiltrate, question aspiration given the degree of gastric distension on prior. Electronically Signed   By: Monte Fantasia M.D.   On: 11/17/2020 04:33   DG Chest Portable 1 View  Result Date: 11/17/2020 CLINICAL DATA:  Short of breath, rales EXAM: PORTABLE CHEST 1 VIEW COMPARISON:  07/20/2015 FINDINGS: Single frontal view of the chest  demonstrates a stable cardiac silhouette. There is prominence of the central pulmonary vasculature, without acute airspace disease, effusion, or pneumothorax. Postsurgical changes are seen from thyroidectomy. No acute bony abnormalities. IMPRESSION: 1. Increased central vascular congestion without overt edema. Electronically Signed   By: Randa Ngo M.D.   On: 11/17/2020 02:21    EKG: I independently viewed the EKG done and my findings are as followed: Sinus or ectopic atrial tachycardia heart rate of 111  Assessment/Plan Present on Admission:  Small bowel obstruction (HCC)  Aspiration pneumonia (HCC)  Hypokalemia  Leukocytosis  Hypothyroidism  Acute renal failure ([place])  Principal Problem:   Acute respiratory failure with hypoxia ([place]) Active Problems:   Aspiration pneumonia (HCC)   Hypokalemia   Acute renal failure (HCC)   Leukocytosis   Elevated troponin   Hypothyroidism   Sepsis (HCC)   Gastroesophageal reflux disease with hiatal hernia   BPH (benign prostatic hyperplasia)   Essential hypertension   Small bowel obstruction (HCC)   Flash pulmonary edema (HCC)   Elevated brain natriuretic peptide (BNP) level   Elevated d-dimer  Small bowel obstruction CT abdomen and pelvis without contrast showed small bowel obstruction, with transition in the distal jejunum right lower quadrant. NG tube was placed and 800 cc of coffee-ground fluid like which was negative for Gastroccult was obtained in the canister Continue NG tube  Continue NPO at this time with plan to advance diet as tolerated Continue IV hydration Patient has no abdominal pain at this time Continue Zofran p.r.n. for nausea/vomiting General surgery will be consulted and she will await further recommendation  Acute respiratory failure with hypoxia possibly due to multifactorial including above This may be due to patient's abdominal distention compromising respiratory status, considering improved respiration and  O2 sat shortly after NGT was inserted and 800 cc of fluid was aspirated. O2 sat is currently at 92-96% on room air  Sepsis possibly secondary to aspiration pneumonia Chest x-ray showed bilateral pulmonary infiltrate suspicious to be due to aspiration given the degree of gastric distention Patient was tachypneic, tachycardic (met SIRS criteria).  He was hypoxic due to respiratory compromise possibly due to abdominal distention vs aspiration (met sepsis criteria).  Lactic acid was elevated at 5.8, procalcitonin was 0.38 Patient was started on IV Vanco and Zosyn, we shall continue with same at this time  Questionable flash pulmonary edema  Patient was hypotensive and met  sepsis criteria Continue treatment as per above for sepsis at this time  Hypokalemia K+ 2.8, this was replenished  Elevated troponin rule out NSTEMI vs type II demand ischemia High-sensitivity troponin-144 > 223, continue to trend troponin He denies any chest pain at this time Patient was started on IV heparin drip in the ED Cardiology will be consulted and we shall await further recommendation  Leukocytosis possibly reactive vs infective WBC 11.2; continue treatment as per above for sepsis Continue to monitor WBC with morning labs  Elevated BNP r/o CHF BNP 203, patient does not appear fluid overloaded, patient's elevated creatinine may be a contributing factor to elevated BNP Continue total input/output, daily weights and fluid restriction EKG personally reviewed showed sinus or ectopic atrial tachycardia at a rate of 111 bpm Echocardiogram will be done in the morning   Elevated D-dimer D-dimer 2.73; CT angiography of chest cannot be done at this time due to patient's kidney status VQ scan will be done in the morning  Hyperglycemia Glucose level at 259, patient has no history of T2DM Hemoglobin A1c will be checked  Acute kidney injury BUN/creatinine 33/2.06 (most recent creatinine for comparison was 5 years ago  and it was 1.40). IV hydration was provided Renally adjust medications, avoid nephrotoxic agents/dehydration/hypotension  Essential hypertension BP meds will be held at this time due to hypotension  Hypothyroidism Continue Synthroid  GERD/ ventral hernia Continue Protonix  Glaucoma Continue latanoprost  BPH Continue home meds when patient resumes oral intake   DVT prophylaxis: Heparin drip  Code Status: Full code  Family Communication: None at bedside  Disposition Plan:  Patient is from:                        home Anticipated DC to:                   SNF or family members home Anticipated DC date:               2-3 days Anticipated DC barriers:           Patient requires inpatient management due to small bowel obstruction requiring NG tube and pending surgical consult as well as cardiology consult to rule out NSTEMI   Consults called: General surgery, cardiology  Admission status: Inpatient    Bernadette Hoit MD [facility]  11/17/2020, 7:37 AM

## 2020-11-17 NOTE — ED Provider Notes (Addendum)
[facility] EMERGENCY DEPARTMENT Provider Note   CSN: [id number] Arrival date & time: 11/17/20  0152     History Chief Complaint  Patient presents with  . Shortness of Breath    Victor Hunter is a 87 y.o. male.  HPI     This an 87 year old male with a history of esophagitis, BPH, hypertension who presents with shortness of breath.  Patient reports acute onset of shortness of breath yesterday evening.  No known history of heart failure and no similar symptoms in the past.  Has not noted any lower extremity swelling.  No fevers or cough.  Denies chest pain.  Is fully vaccinated against COVID-19.  He is not having any chest pain.  Of note, he was seen in urgent care yesterday for constipation.  Reports he feels significantly bloated.  He was discharged with a cleanout regimen which she had started but had not finished.  He has not yet had a bowel movement.  He feels significantly distended.  No nausea or vomiting.  Per EMS, patient called out for shortness of breath.  Noted to be significantly tachycardic and hypoxic on room air to 78.  He is placed on a nonrebreather with improvement of O2 sats.   Past Medical History:  Diagnosis Date  . Blood transfusion without reported diagnosis   . Chronic anemia   . Enlarged prostate    Elevated PSA  . Esophagitis    High grade squamous dysplasia occurring in the background of chronic active esophagitis,, EGD Dr. Christoper Fabian benign biopsies February 2005  . GERD (gastroesophageal reflux disease)   . History of BPH    s/p TURP 2008  . Hypertension   . Ileus ([place])   . Mallory - Weiss tear   . Thyroid disease   . Upper GI bleed     Patient Active Problem List   Diagnosis Date Noted  . Small bowel obstruction ([place]) 11/17/2020  . Flash pulmonary edema ([place]) 11/17/2020  . Elevated brain natriuretic peptide (BNP) level 11/17/2020  . Elevated d-dimer 11/17/2020  . Ventral hernia 01/17/2017  . Benign essential HTN   . Complicated UTI  (urinary tract infection) 07/21/2015  . Fever in adult   . Pain in left testicle   . Epididymitis, left   . Hx of toxic multinodular goiter   . Multiple gastric polyps   . Postoperative hypothyroidism   . Elevated PSA   . Urinary tract infection 07/20/2015  . Enlarged prostate 07/20/2015  . Encounter for screening colonoscopy 09/26/2012  . Gastroesophageal reflux disease with hiatal hernia 09/10/2012  . Hemorrhoids, external, with complication 02/40/9735  . Hematemesis 08/27/2012    Class: Acute  . Syncope and collapse 08/27/2012  . SIRS (systemic inflammatory response syndrome) ([place]) 08/27/2012  . DVT of upper extremity (deep vein thrombosis) ([place]) 05/04/2012  . CAP (community acquired pneumonia) 05/01/2012  . Protein calorie malnutrition ([place]) 05/01/2012  . Fever 05/01/2012  . UTI (lower urinary tract infection) 05/01/2012  . Leukocytosis 05/01/2012  . Elevated troponin 05/01/2012  . Hypothyroidism 05/01/2012  . Sepsis associated hypotension ([place]) 05/01/2012  . Acute respiratory failure with hypoxia ([place]) 04/27/2012  . Respiratory alkalosis 04/27/2012  . Aspiration pneumonia ([place]) 04/27/2012  . Hypokalemia 04/27/2012  . Acute renal failure ([place]) 04/27/2012  . S/P exploratory laparotomy 04/27/2012  . Adynamic ileus ([place]) 04/27/2012    Past Surgical History:  Procedure Laterality Date  . CENTRAL VENOUS CATHETER INSERTION  04/26/2012   Procedure: INSERTION CENTRAL LINE ADULT;  Surgeon: Lollie Sails  Geroge Baseman, MD;  Location: [facility];  Service: General;  Laterality: Left;  started at 0919, ended at 0930,        subclavian  . CHOLECYSTECTOMY    . COLONOSCOPY  10/29/03   rourk-inflammatory polyp from 40 cm removed  . COLONOSCOPY  10/02/2012   Dr. Gala Romney: normal  . ESOPHAGOGASTRODUODENOSCOPY  08/31/08   Rourk-possible extrinsic impression on midesophagus, noncritical Schatzki's ring, 3 mm fundal gland polyp, multiple gastric fundic polyps not manipulated, small hiatal hernia : CT chest  showed mediastinal mass, eventually underwent thyroidectomy  . ESOPHAGOGASTRODUODENOSCOPY  08/27/2012   Dr. Cristina Gong: moderate sized hiatal hernia, cameron lesions  . LAPAROTOMY  04/26/2012   Procedure: EXPLORATORY LAPAROTOMY;  Surgeon: Donato Heinz, MD;  Location: [facility];  Service: General;  Laterality: N/A;  . THYROIDECTOMY    . TRANSURETHRAL RESECTION OF PROSTATE     pt denies, but this is listed in his past medical records.        Family History  Problem Relation Age of Onset  . Cancer Other   . Heart failure Other   . Liver disease Sister   . Breast cancer Sister   . Hypertension Mother   . Stroke Father   . Colon cancer Neg Hx     Social History   Tobacco Use  . Smoking status: Former Smoker    Packs/day: 0.50    Types: Cigarettes    Quit date: 09/10/1992    Years since quitting: 28.2  . Smokeless tobacco: Never Used  Substance Use Topics  . Alcohol use: No  . Drug use: No    Home Medications Prior to Admission medications   Medication Sig Start Date End Date Taking? Authorizing Provider  bisacodyl (DULCOLAX) 5 MG EC tablet Take 1 tablet (5 mg total) by mouth daily as needed for moderate constipation. 11/16/20   Wurst, [place], PA-C  diltiazem (DILACOR XR) 180 MG 24 hr capsule Take 180 mg by mouth daily.    [provider]  finasteride (PROSCAR) 5 MG tablet TK 1 T PO D UTD 08/03/15   [provider]  HYDROcodone-acetaminophen (NORCO/VICODIN) 5-325 MG tablet Take 1 tablet by mouth every 6 (six) hours as needed for severe pain. 02/12/17   Carole Civil, MD  latanoprost (XALATAN) 0.005 % ophthalmic solution Place 1 drop into both eyes at bedtime. 01/23/17   [provider]  levothyroxine (SYNTHROID, LEVOTHROID) 112 MCG tablet Take 112 mcg by mouth daily before breakfast.    [provider]  pantoprazole (PROTONIX) 40 MG tablet Take 1 tablet (40 mg total) by mouth daily. 03/06/20   Avegno, Darrelyn Hillock, FNP  polyethylene glycol  (MIRALAX / GLYCOLAX) 17 g packet Take 17 g by mouth daily. 11/16/20   Wurst, [place], PA-C    Allergies    Patient has no known allergies.  Review of Systems   Review of Systems  Constitutional: Negative for fever.  Respiratory: Positive for shortness of breath. Negative for cough.   Cardiovascular: Negative for chest pain, palpitations and leg swelling.  Gastrointestinal: Positive for abdominal distention and constipation. Negative for nausea and vomiting.  Genitourinary: Negative for dysuria.  All other systems reviewed and are negative.   Physical Exam Updated Vital Signs BP (!) 73/52 (BP Location: Left Arm)   Pulse 95   Temp 98.1 F (36.7 C) (Rectal)   Resp 19   Ht 1.6 m (5\' 3" )   Wt 77.4 kg   SpO2 92%   BMI 30.23 kg/m   Physical  Exam Vitals and nursing note reviewed.  Constitutional:      Appearance: He is well-developed. He is ill-appearing. He is not toxic-appearing.  HENT:     Head: Normocephalic and atraumatic.     Mouth/Throat:     Mouth: Mucous membranes are moist.  Eyes:     Pupils: Pupils are equal, round, and reactive to light.  Cardiovascular:     Rate and Rhythm: Normal rate and regular rhythm.     Heart sounds: Normal heart sounds. No murmur heard.   Pulmonary:     Effort: Pulmonary effort is normal. Tachypnea present. No respiratory distress.     Breath sounds: Rales present. No wheezing.     Comments: Rales in all lung fields, tachypnea noted, accessory muscle use Abdominal:     General: Bowel sounds are normal.     Tenderness: There is no abdominal tenderness. There is no rebound.     Comments: Distended, ventral hernia noted, taut  Musculoskeletal:     Cervical back: Neck supple.     Right lower leg: No tenderness. No edema.     Left lower leg: No tenderness. No edema.  Lymphadenopathy:     Cervical: No cervical adenopathy.  Skin:    General: Skin is warm and dry.  Neurological:     Mental Status: He is alert and oriented to person,  place, and time.  Psychiatric:        Mood and Affect: Mood normal.     ED Results / Procedures / Treatments   Labs (all labs ordered are listed, but only abnormal results are displayed) Labs Reviewed  CBC WITH DIFFERENTIAL/PLATELET - Abnormal; Notable for the following components:      Result Value   WBC 11.2 (*)    MCH 25.3 (*)    RDW 16.2 (*)    Neutro Abs 9.7 (*)    All other components within normal limits  COMPREHENSIVE METABOLIC PANEL - Abnormal; Notable for the following components:   Potassium 2.8 (*)    CO2 19 (*)    Glucose, Bld 259 (*)    BUN 33 (*)    Creatinine, Ser 2.06 (*)    GFR, Estimated 31 (*)    Anion gap 21 (*)    All other components within normal limits  BRAIN NATRIURETIC PEPTIDE - Abnormal; Notable for the following components:   B Natriuretic Peptide 203.0 (*)    All other components within normal limits  D-DIMER, QUANTITATIVE - Abnormal; Notable for the following components:   D-Dimer, Quant 2.73 (*)    All other components within normal limits  LACTIC ACID, PLASMA - Abnormal; Notable for the following components:   Lactic Acid, Venous 5.8 (*)    All other components within normal limits  TROPONIN I (HIGH SENSITIVITY) - Abnormal; Notable for the following components:   Troponin I (High Sensitivity) 144 (*)    All other components within normal limits  TROPONIN I (HIGH SENSITIVITY) - Abnormal; Notable for the following components:   Troponin I (High Sensitivity) 223 (*)    All other components within normal limits  RESP PANEL BY RT-PCR (FLU A&B, COVID) [id number]  CULTURE, BLOOD (ROUTINE X 2)  CULTURE, BLOOD (ROUTINE X 2)  MAGNESIUM  OCCULT BLOOD GASTRIC / DUODENUM (SPECIMEN CUP)  HEPARIN LEVEL (UNFRACTIONATED)  LACTIC ACID, PLASMA  PROCALCITONIN  GASTRIC OCCULT BLOOD (1-CARD TO LAB)    EKG EKG Interpretation  Date/Time:  Wednesday November 17 2020 01:56:29 EDT Ventricular Rate:  130 PR Interval:  QRS Duration: 85 QT Interval:  344 QTC  Calculation: 506 R Axis:   -36 Text Interpretation: Sinus or ectopic atrial tachycardia Left axis deviation Abnormal R-wave progression, early transition ST depression, probably rate related Prolonged QT interval wandering baseline, difficult to interpret Confirmed by Thayer Jew [phone]) on 11/17/2020 2:15:52 AM   Radiology CT ABDOMEN PELVIS WO CONTRAST  Result Date: 11/17/2020 CLINICAL DATA:  Abdominal distension, constipation, short of breath EXAM: CT ABDOMEN AND PELVIS WITHOUT CONTRAST TECHNIQUE: Multidetector CT imaging of the abdomen and pelvis was performed following the standard protocol without IV contrast. COMPARISON:  11/17/2020 FINDINGS: Lower chest: There is bibasilar airspace disease greatest within the lower lobes, left greater than right. Findings could reflect edema or infection. No effusion or pneumothorax. Unenhanced CT was performed per clinician order. Lack of IV contrast limits sensitivity and specificity, especially for evaluation of abdominal/pelvic solid viscera. Hepatobiliary: No focal liver abnormality is seen. Status post cholecystectomy. No biliary dilatation. Pancreas: Unremarkable. No pancreatic ductal dilatation or surrounding inflammatory changes. Spleen: Normal in size without focal abnormality. Adrenals/Urinary Tract: No urinary tract calculi or obstructive uropathy. The adrenals and bladder are unremarkable. Stomach/Bowel: There is distension of the stomach and proximal small bowel, with numerous proximal small bowel gas fluid levels, consistent with obstruction. There is change in caliber within the distal jejunum within the right lower quadrant. A stable Richter type hernia within the central upper abdomen is identified, with protrusion of the anti mesenteric aspect of the mid transverse colonic wall through the hernia defect. No wall thickening or fat stranding to suggest incarceration. Vascular/Lymphatic: Aortic atherosclerosis. No enlarged abdominal or pelvic lymph  nodes. Reproductive: Continued marked enlargement of the prostate, which measures 5.8 x 6.1 x 6.0 cm. Other: No free fluid or free gas. Richter type hernia in the central upper abdomen as described above. Musculoskeletal: No acute or destructive bony lesions. Reconstructed images demonstrate no additional findings. IMPRESSION: 1. Small bowel obstruction, with transition in the distal jejunum right lower quadrant. 2. Bibasilar airspace disease, greatest in the left lower lobe. Favor infection over edema. 3. Richter hernia central upper abdomen, with protrusion of the anti mesenteric wall of the mid transverse colon through the hernia defect. No incarceration. 4. Marked enlargement of the prostate. 5.  Aortic Atherosclerosis (ICD10-I70.0). Electronically Signed   By: Randa Ngo M.D.   On: 11/17/2020 03:39   DG Chest 1 View  Result Date: 11/17/2020 CLINICAL DATA:  Nasogastric tube insertion EXAM: CHEST  1 VIEW COMPARISON:  Chest x-ray earlier today FINDINGS: The enteric tube tip is at the stomach with side port near the GE junction. Opacity in the lower lungs as seen on preceding CT. Normal heart size. Presumed thyroidectomy. No visible effusion or pneumothorax IMPRESSION: 1. Enteric tube with tip at the stomach and side port near the GE junction. 2. Bilateral pulmonary infiltrate, question aspiration given the degree of gastric distension on prior. Electronically Signed   By: Monte Fantasia M.D.   On: 11/17/2020 04:33   DG Chest Portable 1 View  Result Date: 11/17/2020 CLINICAL DATA:  Short of breath, rales EXAM: PORTABLE CHEST 1 VIEW COMPARISON:  07/20/2015 FINDINGS: Single frontal view of the chest demonstrates a stable cardiac silhouette. There is prominence of the central pulmonary vasculature, without acute airspace disease, effusion, or pneumothorax. Postsurgical changes are seen from thyroidectomy. No acute bony abnormalities. IMPRESSION: 1. Increased central vascular congestion without overt  edema. Electronically Signed   By: Randa Ngo M.D.   On: 11/17/2020 02:21  Procedures .Critical Care Performed by: Merryl Hacker, MD Authorized by: Merryl Hacker, MD   Critical care provider statement:    Critical care time (minutes):  8   Critical care was necessary to treat or prevent imminent or life-threatening deterioration of the following conditions:  Respiratory failure, cardiac failure and renal failure   Critical care was time spent personally by me on the following activities:  Discussions with consultants, evaluation of patient's response to treatment, examination of patient, ordering and performing treatments and interventions, ordering and review of laboratory studies, ordering and review of radiographic studies, pulse oximetry, re-evaluation of patient's condition, obtaining history from patient or surrogate and review of old charts     Medications Ordered in ED Medications  heparin ADULT infusion 100 units/mL (25000 units/218mL) (950 Units/hr Intravenous New Bag/Given 11/17/20 0319)  vancomycin (VANCOCIN) IVPB 1000 mg/200 mL premix (1,000 mg Intravenous New Bag/Given 11/17/20 0557)  lactated ringers infusion (has no administration in time range)  sodium chloride 0.9 % bolus 2,000 mL (has no administration in time range)  aspirin tablet 325 mg (325 mg Oral Given 11/17/20 0321)  potassium chloride 10 mEq in 100 mL IVPB (0 mEq Intravenous Stopped 11/17/20 0528)  heparin bolus via infusion 4,000 Units (4,000 Units Intravenous Bolus from Bag 11/17/20 0320)  morphine 4 MG/ML injection 4 mg (4 mg Intravenous Given 11/17/20 0352)  piperacillin-tazobactam (ZOSYN) IVPB 3.375 g (0 g Intravenous Stopped 11/17/20 0555)  sodium chloride 0.9 % bolus 500 mL (500 mLs Intravenous New Bag/Given 11/17/20 0515)    ED Course  I have reviewed the triage vital signs and the nursing notes.  Pertinent labs & imaging results that were available during my care of the patient were  reviewed by me and considered in my medical decision making (see chart for details).  Clinical Course as of 11/17/20 0603  Wed Nov 17, 2020  0345 On recheck, patient appears more comfortable.  Heart rate down to 102.  Still on a nonrebreather satting 99%.  Will attempt to titrate. [CH]  0400 NG tube placed for obstruction.  Greater than 800 cc output.  Chart reviewed.  Prior obstructions that did not clear with medical management requiring exploratory ex lap.  He had a complicated postoperative course requiring transfer [facility] because he could not be extubated postoperatively. [CH]  [phone] Spoke to Dr. Constance Haw, general surgery.  She has reviewed the case.  Agrees that he would be best served at [facility] as his presentation is complicated and currently his pulmonary/cardiac status is guarded.  Agrees with NG tube. [CH]  [id number] Informed by nursing patient had drop in BP.  Remains afebrile.  Have added blood cultures and lactate to screen for sepsis although his initial presentation was not consistent with this.  He does have some findings on his CT scan in his lower lung fields which question edema versus infection.  Will cover with Vanco and Zosyn which would also cover any intra-abdominal source.  Will hold aggressive fluid resuscitation as long as maps remain above 65 given his initial presentation consistent with flash pulmonary edema. [CH]  [phone] Spoke with cardiology, Dr. Kalman Shan.  Reviewed case with him.  Agrees with management at this point.  Feels that his troponin may be related to acute distress and tachycardia/renal failure and not true ACS.  We will continue to trend.  If needed, formal cardiology consult on admission. [CH]  [phone] Spoke with hospitalist regarding admission.  Case was discussed in full.  Will monitor  blood pressure closely.  If continues to trend downward, will volume resuscitate. [CH]  0521 On my evaluation, blood pressure remains soft all the patient is awake, alert,  oriented.  He states he feels much better and clinically appears much better.  Given that his respiratory status has significantly improved, will initiate 500 cc bolus.  He has had significant NG output.  It appears coffee-ground.  I have requested a Gastroccult.  If positive, will need to wait benefit of heparin.  Initial hemoglobin was 14.1 which is reassuring. [CH]  0557 Lactate now 5.8.  Unclear whether this is developing sepsis versus related to SBO and acute presentation.  Fluids have been started for marginal blood pressures that developed within the last 1-2 hours.  Would consider 30 cc/kg of fluid.  Hospitalist updated. [CH]  0602 30 cc/kg fluid have been ordered.  Patient remains clinically stable otherwise.  Blood pressure still soft but he appears much improved clinically overall. [CH]    Clinical Course User Index [CH] Lorella Gomez, Barbette Hair, MD   MDM Rules/Calculators/A&P                          Patient presents with shortness of breath, hypoxia and tachycardia per EMS.  He is ill-appearing but nontoxic on exam.  He is on a nonrebreather and tachypneic.  Other notable physical exam findings include rales bilaterally and markedly distended and taut abdomen.  Unclear etiology.  No history of arrhythmia or atrial fibrillation.  EKG shows likely sinus tachycardia.  Work-up initiated including chest x-ray, lab work.  Chest x-ray shows possible vascular congestion.  His shortness of breath certainly seems to have been acute in onset.  Question flash pulmonary edema.  Pneumonia, Covid also consideration.  Given abdominal distention, aspiration would also be a consideration.  Labs reviewed.  D-dimer 2.73.  Unfortunately, creatinine acutely elevated greater than 2.  This precludes CT PE study.  Patient was started on heparin.  Initial troponin I 44.  This is in the setting of AKI but is fairly elevated.  Presentation was not typical for ACS but question whether NSTEMI causing flash pulmonary edema.   Potassium 2.8.  This was replaced.  CT scan of the abdomen was obtained given his marked distention.  It does show a bowel obstruction with a transition point.  Unfortunately, patient has a poor history of the same that did not resolve with medical management.  He had an ex lap and a protracted post hospital course.  NG tube was placed with 800 cc of output.  Please see clinical course above.  Discussed with local surgeon Dr. Constance Haw who agrees the patient would be best served at [facility] given complexity of presentation and respiratory/cardiac status at this time.  Will discuss with admitting hospitalist.  Patient also sent to cardiology given uptrending troponins.  Of note, patient blood pressure became more soft.  Additional work-up added including lactate and blood cultures.  He was covered with vancomycin and Zosyn to cover for aspiration pneumonia and possible intra-abdominal source.  Patient was able to be weaned off oxygen to 2 L nasal cannula and appears much more comfortable with NG tube in place.  Final Clinical Impression(s) / ED Diagnoses Final diagnoses:  SBO (small bowel obstruction) (HCC)  Hypoxia  Hypokalemia  Elevated troponin  AKI (acute kidney injury) ([place])    Rx / DC Orders ED Discharge Orders    None       Opel Lejeune, Barbette Hair,  MD 11/17/20 3794    Merryl Hacker, MD 11/17/20 3276    Merryl Hacker, MD 11/17/20 [phone]

## 2020-11-17 NOTE — ED Notes (Addendum)
Pt called and spoke with daughter Otila Kluver. I was able to speak with her also. She was updated on patient status. All questions answered at this time.  Otila Kluver is requesting to be called when pt leaves [facility] and is heading to [facility]. Her cell is: [phone].

## 2020-11-17 NOTE — Progress Notes (Signed)
ANTICOAGULATION CONSULT NOTE - Initial Consult  Pharmacy Consult for Heparin Indication: chest pain/ACS  No Known Allergies  Patient Measurements: Height: 5\' 3"  (160 cm) Weight: 77.4 kg (170 lb 10.2 oz) IBW/kg (Calculated) : 56.9 Heparin Dosing Weight: 70 kg  Vital Signs: Temp: 98.1 F (36.7 C) (03/16 0225) Temp Source: Rectal (03/16 0225) BP: 91/62 (03/16 0245) Pulse Rate: 101 (03/16 0245)  Labs: Recent Labs    11/17/20 0203  HGB 14.1  HCT 46.4  PLT 260  CREATININE 2.06*  TROPONINIHS 144*    Estimated Creatinine Clearance: 24.6 mL/min (A) (by C-G formula based on SCr of 2.06 mg/dL (H)).   Medical History: Past Medical History:  Diagnosis Date  . Blood transfusion without reported diagnosis   . Chronic anemia   . Enlarged prostate    Elevated PSA  . Esophagitis    High grade squamous dysplasia occurring in the background of chronic active esophagitis,, EGD Dr. Christoper Fabian benign biopsies February 2005  . GERD (gastroesophageal reflux disease)   . History of BPH    s/p TURP 2008  . Hypertension   . Ileus ([place])   . Mallory - Weiss tear   . Thyroid disease   . Upper GI bleed     Medications:  No current facility-administered medications on file prior to encounter.   Current Outpatient Medications on File Prior to Encounter  Medication Sig Dispense Refill  . bisacodyl (DULCOLAX) 5 MG EC tablet Take 1 tablet (5 mg total) by mouth daily as needed for moderate constipation. 30 tablet 0  . diltiazem (DILACOR XR) 180 MG 24 hr capsule Take 180 mg by mouth daily.    . finasteride (PROSCAR) 5 MG tablet TK 1 T PO D UTD  3  . HYDROcodone-acetaminophen (NORCO/VICODIN) 5-325 MG tablet Take 1 tablet by mouth every 6 (six) hours as needed for severe pain. 15 tablet 0  . latanoprost (XALATAN) 0.005 % ophthalmic solution Place 1 drop into both eyes at bedtime.    Marland Kitchen levothyroxine (SYNTHROID, LEVOTHROID) 112 MCG tablet Take 112 mcg by mouth daily before breakfast.    .  pantoprazole (PROTONIX) 40 MG tablet Take 1 tablet (40 mg total) by mouth daily. 30 tablet 2  . polyethylene glycol (MIRALAX / GLYCOLAX) 17 g packet Take 17 g by mouth daily. 14 each 0     Assessment: 87 y.o. male with eleavated troponin, possible ACS, for heparin Goal of Therapy:  Heparin level 0.3-0.7 units/ml Monitor platelets by anticoagulation protocol: Yes   Plan:  Heparin 4000 units IV bolus, then start heparin 950 units/hr Check heparin level in 8 hours.    Caryl Pina 11/17/2020,3:07 AM

## 2020-11-17 NOTE — Progress Notes (Addendum)
ASSUMPTION OF CARE NOTE   11/17/2020 11:34 AM  Victor Hunter was seen and examined in ED while awaiting transfer to [facility].  The H&P by the admitting provider, orders, imaging was reviewed.  Please see new orders.  Lactic acid trending down with fluids.  NG in place with copious output. He is awaiting for transfer to [facility] per request from surgical team. Continue IV antiibiotics for presumed aspiration pneumonitis.  Continue NPO status.   Currently he denies chest pain and SOB.   Vitals:   11/17/20 0715 11/17/20 0825  BP: 113/62 139/68  Pulse: 95 98  Resp: 20 20  Temp:    SpO2: 95% 91%    Results for orders placed or performed during the hospital encounter of 11/17/20  Resp Panel by RT-PCR (Flu A&B, Covid) Nasopharyngeal Swab   Specimen: Nasopharyngeal Swab; Nasopharyngeal(NP) swabs in vial transport medium  Result Value Ref Range   SARS Coronavirus 2 by RT PCR NEGATIVE NEGATIVE   Influenza A by PCR NEGATIVE NEGATIVE   Influenza B by PCR NEGATIVE NEGATIVE  Blood culture (routine x 2)   Specimen: BLOOD  Result Value Ref Range   Specimen Description BLOOD RIGHT ANTECUBITAL    Special Requests      BOTTLES DRAWN AEROBIC AND ANAEROBIC Blood Culture adequate volume   Culture      NO GROWTH < 12 HOURS Performed at [facility], [address] [place], [place] [address]    Report Status PENDING   Blood culture (routine x 2)   Specimen: BLOOD  Result Value Ref Range   Specimen Description BLOOD LEFT ANTECUBITAL    Special Requests      BOTTLES DRAWN AEROBIC AND ANAEROBIC Blood Culture adequate volume   Culture      NO GROWTH < 12 HOURS Performed at [facility], [address], [place], [place] [address]    Report Status PENDING   CBC with Differential  Result Value Ref Range   WBC 11.2 (H) 4.0 - 10.5 K/uL   RBC 5.57 4.22 - 5.81 MIL/uL   Hemoglobin 14.1 13.0 - 17.0 g/dL   HCT 46.4 39.0 - 52.0 %   MCV 83.3 80.0 - 100.0 fL   MCH 25.3 (L) 26.0 - 34.0 pg   MCHC 30.4 30.0 - 36.0  g/dL   RDW 16.2 (H) 11.5 - 15.5 %   Platelets 260 150 - 400 K/uL   nRBC 0.0 0.0 - 0.2 %   Neutrophils Relative % 87 %   Neutro Abs 9.7 (H) 1.7 - 7.7 K/uL   Lymphocytes Relative 10 %   Lymphs Abs 1.2 0.7 - 4.0 K/uL   Monocytes Relative 3 %   Monocytes Absolute 0.3 0.1 - 1.0 K/uL   Eosinophils Relative 0 %   Eosinophils Absolute 0.0 0.0 - 0.5 K/uL   Basophils Relative 0 %   Basophils Absolute 0.0 0.0 - 0.1 K/uL   Immature Granulocytes 0 %   Abs Immature Granulocytes 0.02 0.00 - 0.07 K/uL  Comprehensive metabolic panel  Result Value Ref Range   Sodium 138 135 - 145 mmol/L   Potassium 2.8 (L) 3.5 - 5.1 mmol/L   Chloride 98 98 - 111 mmol/L   CO2 19 (L) 22 - 32 mmol/L   Glucose, Bld 259 (H) 70 - 99 mg/dL   BUN 33 (H) 8 - 23 mg/dL   Creatinine, Ser 2.06 (H) 0.61 - 1.24 mg/dL   Calcium 9.0 8.9 - 10.3 mg/dL   Total Protein 7.8 6.5 - 8.1  g/dL   Albumin 3.8 3.5 - 5.0 g/dL   AST 29 15 - 41 U/L   ALT 27 0 - 44 U/L   Alkaline Phosphatase 116 38 - 126 U/L   Total Bilirubin 0.7 0.3 - 1.2 mg/dL   GFR, Estimated 31 (L) >60 mL/min   Anion gap 21 (H) 5 - 15  Brain natriuretic peptide  Result Value Ref Range   B Natriuretic Peptide 203.0 (H) 0.0 - 100.0 pg/mL  D-dimer, quantitative  Result Value Ref Range   D-Dimer, Quant 2.73 (H) 0.00 - 0.50 ug/mL-FEU  Magnesium  Result Value Ref Range   Magnesium 2.0 1.7 - 2.4 mg/dL  Lactic acid, plasma  Result Value Ref Range   Lactic Acid, Venous 5.8 (HH) 0.5 - 1.9 mmol/L  Lactic acid, plasma  Result Value Ref Range   Lactic Acid, Venous 4.6 (HH) 0.5 - 1.9 mmol/L  Occult bld gastric/duodenum (cup to lab)  Result Value Ref Range   pH, Gastric 4    Occult Blood, Gastric NEGATIVE NEGATIVE  Procalcitonin - Baseline  Result Value Ref Range   Procalcitonin 0.38 ng/mL  Lactic acid, plasma  Result Value Ref Range   Lactic Acid, Venous 3.8 (HH) 0.5 - 1.9 mmol/L  ECHOCARDIOGRAM COMPLETE  Result Value Ref Range   Weight 2,730.18 oz   Height 63 in    BP 139/68 mmHg  Troponin I (High Sensitivity)  Result Value Ref Range   Troponin I (High Sensitivity) 144 (HH) <18 ng/L  Troponin I (High Sensitivity)  Result Value Ref Range   Troponin I (High Sensitivity) 223 (HH) <18 ng/L  Troponin I (High Sensitivity)  Result Value Ref Range   Troponin I (High Sensitivity) 373 (HH) <18 ng/L  Troponin I (High Sensitivity)  Result Value Ref Range   Troponin I (High Sensitivity) 344 (HH) <18 ng/L   C. Wynetta Emery, MD [facility]   11/17/2020  1:53 AM How to contact the [facility] Attending or Consulting provider [place] or covering provider during after hours [place], for this patient?  1. Check the care team in [facility] and look for a) attending/consulting [facility] provider listed and b) the [facility] team listed 2. Log into [email or web address] and use [place]'s universal password to access. If you do not have the password, please contact the hospital operator. 3. Locate the [facility] provider you are looking for under [facility] and page to a number that you can be directly reached. 4. If you still have difficulty reaching the provider, please page the [facility] (Director on Call) for the Hospitalists listed on amion for assistance.

## 2020-11-17 NOTE — ED Notes (Signed)
CRITICAL VALUE STICKER  CRITICAL VALUE: Troponin 223  DATE & TIME NOTIFIED: 11/17/20 4:35 AM  MD NOTIFIED: Dr. Dina Rich  TIME OF NOTIFICATION: 4:35 AM

## 2020-11-17 NOTE — ED Triage Notes (Signed)
[facility]- pt seen this afternoon at urgent care for constipation x3 days. Once pt got home he became SOB. Upon EMS arrival pt had rales in all lung fields, 78% on RA. Pt placed on non-rebreather and went up to 90%. IV in left AC.

## 2020-11-17 NOTE — ED Notes (Addendum)
NRB removed for NG tube insertion. O2 remained 92-94% on room air. Dr. Dina Rich made aware. O2 will remain off as long as pt tolerates it. Will continue to monitor.

## 2020-11-17 NOTE — ED Notes (Signed)
Date and time results received: 11/17/20 0534  Test: lactic  Critical Value: 5.8  Name of Provider Notified: Horton, MD  Orders Received? Or Actions Taken?: acknowledged

## 2020-11-17 NOTE — ED Notes (Addendum)
O2 turned down to 13L NRB, pt O2 remained 100%. Will continue to monitor.

## 2020-11-17 NOTE — Progress Notes (Signed)
Pharmacy Antibiotic Note  Victor Hunter is a 87 y.o. male admitted on 11/17/2020 with pneumonia.  Pharmacy has been consulted for Vancomycin dosing.  Plan: Vancomycin 1500mg  IV q48h, AUC 520 Zosyn 3.375gm IV q8h EID over 4 hours F/U cxs and clinical progress Monitor V/S, labs and levels as indicated  Height: 5\' 3"  (160 cm) Weight: 77.4 kg (170 lb 10.2 oz) IBW/kg (Calculated) : 56.9  Temp (24hrs), Avg:98.2 F (36.8 C), Min:97.3 F (36.3 C), Max:99.2 F (37.3 C)  Recent Labs  Lab 11/17/20 0203 11/17/20 0504 11/17/20 0624  WBC 11.2*  --   --   CREATININE 2.06*  --   --   LATICACIDVEN  --  5.8* 4.6*    Estimated Creatinine Clearance: 24.6 mL/min (A) (by C-G formula based on SCr of 2.06 mg/dL (H)).    No Known Allergies  Antimicrobials this admission: Vancomycin 3/16 >>  Zosyn 3/16 >>   Dose adjustments this admission: PRN  Microbiology results: 3/16 BCx: pending   MRSA PCR:   Thank you for allowing pharmacy to be a part of this patient's care.  Isac Sarna, BS Pharm D, [place] Clinical Pharmacist Pager [phone] 11/17/2020 8:35 AM

## 2020-11-17 NOTE — ED Notes (Addendum)
CRITICAL Troponin 144  Dr. Dina Rich made aware.

## 2020-11-17 NOTE — Sepsis Progress Note (Signed)
Monitoring for code sepsis protocol. 

## 2020-11-17 NOTE — Progress Notes (Signed)
*  PRELIMINARY RESULTS* Echocardiogram 2D Echocardiogram has been performed.  Leavy Cella 11/17/2020, 9:04 AM

## 2020-11-18 ENCOUNTER — Inpatient Hospital Stay (HOSPITAL_COMMUNITY): Payer: Medicare PPO

## 2020-11-18 ENCOUNTER — Encounter (HOSPITAL_COMMUNITY): Payer: Self-pay | Admitting: Internal Medicine

## 2020-11-18 DIAGNOSIS — N179 Acute kidney failure, unspecified: Secondary | ICD-10-CM | POA: Diagnosis not present

## 2020-11-18 DIAGNOSIS — I10 Essential (primary) hypertension: Secondary | ICD-10-CM

## 2020-11-18 DIAGNOSIS — R7989 Other specified abnormal findings of blood chemistry: Secondary | ICD-10-CM

## 2020-11-18 DIAGNOSIS — J690 Pneumonitis due to inhalation of food and vomit: Secondary | ICD-10-CM | POA: Diagnosis not present

## 2020-11-18 DIAGNOSIS — J9601 Acute respiratory failure with hypoxia: Secondary | ICD-10-CM

## 2020-11-18 DIAGNOSIS — I248 Other forms of acute ischemic heart disease: Secondary | ICD-10-CM

## 2020-11-18 DIAGNOSIS — R778 Other specified abnormalities of plasma proteins: Secondary | ICD-10-CM

## 2020-11-18 LAB — CBC
HCT: 38 % — ABNORMAL LOW (ref 39.0–52.0)
Hemoglobin: 12.5 g/dL — ABNORMAL LOW (ref 13.0–17.0)
MCH: 25.9 pg — ABNORMAL LOW (ref 26.0–34.0)
MCHC: 32.9 g/dL (ref 30.0–36.0)
MCV: 78.8 fL — ABNORMAL LOW (ref 80.0–100.0)
Platelets: 190 10*3/uL (ref 150–400)
RBC: 4.82 MIL/uL (ref 4.22–5.81)
RDW: 16.4 % — ABNORMAL HIGH (ref 11.5–15.5)
WBC: 16.6 10*3/uL — ABNORMAL HIGH (ref 4.0–10.5)
nRBC: 0 % (ref 0.0–0.2)

## 2020-11-18 LAB — CBC WITH DIFFERENTIAL/PLATELET
Abs Immature Granulocytes: 0.05 10*3/uL (ref 0.00–0.07)
Basophils Absolute: 0 10*3/uL (ref 0.0–0.1)
Basophils Relative: 0 %
Eosinophils Absolute: 0 10*3/uL (ref 0.0–0.5)
Eosinophils Relative: 0 %
HCT: 36 % — ABNORMAL LOW (ref 39.0–52.0)
Hemoglobin: 11.6 g/dL — ABNORMAL LOW (ref 13.0–17.0)
Immature Granulocytes: 0 %
Lymphocytes Relative: 11 %
Lymphs Abs: 1.7 10*3/uL (ref 0.7–4.0)
MCH: 25.5 pg — ABNORMAL LOW (ref 26.0–34.0)
MCHC: 32.2 g/dL (ref 30.0–36.0)
MCV: 79.1 fL — ABNORMAL LOW (ref 80.0–100.0)
Monocytes Absolute: 1.1 10*3/uL — ABNORMAL HIGH (ref 0.1–1.0)
Monocytes Relative: 7 %
Neutro Abs: 13 10*3/uL — ABNORMAL HIGH (ref 1.7–7.7)
Neutrophils Relative %: 82 %
Platelets: 185 10*3/uL (ref 150–400)
RBC: 4.55 MIL/uL (ref 4.22–5.81)
RDW: 16.2 % — ABNORMAL HIGH (ref 11.5–15.5)
WBC: 15.8 10*3/uL — ABNORMAL HIGH (ref 4.0–10.5)
nRBC: 0 % (ref 0.0–0.2)

## 2020-11-18 LAB — COMPREHENSIVE METABOLIC PANEL
ALT: 18 U/L (ref 0–44)
AST: 31 U/L (ref 15–41)
Albumin: 2.9 g/dL — ABNORMAL LOW (ref 3.5–5.0)
Alkaline Phosphatase: 80 U/L (ref 38–126)
Anion gap: 10 (ref 5–15)
BUN: 37 mg/dL — ABNORMAL HIGH (ref 8–23)
CO2: 26 mmol/L (ref 22–32)
Calcium: 7.9 mg/dL — ABNORMAL LOW (ref 8.9–10.3)
Chloride: 102 mmol/L (ref 98–111)
Creatinine, Ser: 1.91 mg/dL — ABNORMAL HIGH (ref 0.61–1.24)
GFR, Estimated: 34 mL/min — ABNORMAL LOW (ref >60)
Glucose, Bld: 98 mg/dL (ref 70–99)
Potassium: 3.6 mmol/L (ref 3.5–5.1)
Sodium: 138 mmol/L (ref 135–145)
Total Bilirubin: 0.9 mg/dL (ref 0.3–1.2)
Total Protein: 6.1 g/dL — ABNORMAL LOW (ref 6.5–8.1)

## 2020-11-18 LAB — HEPARIN LEVEL (UNFRACTIONATED)
Heparin Unfractionated: 0.36 IU/mL (ref 0.30–0.70)
Heparin Unfractionated: 0.35 IU/mL (ref 0.30–0.70)

## 2020-11-18 LAB — MRSA PCR SCREENING: MRSA by PCR: NEGATIVE

## 2020-11-18 MED ORDER — SODIUM CHLORIDE 0.9 % IV SOLN
12.5000 mg | Freq: Three times a day (TID) | INTRAVENOUS | Status: DC | PRN
  Administered 2020-11-19 – 2020-11-21 (×3): 12.5 mg via INTRAVENOUS
  Filled 2020-11-18 (×5): qty 0.5

## 2020-11-18 MED ORDER — HYDRALAZINE HCL 20 MG/ML IJ SOLN
10.0000 mg | Freq: Three times a day (TID) | INTRAMUSCULAR | Status: DC
  Administered 2020-11-18 – 2020-11-20 (×6): 10 mg via INTRAVENOUS
  Filled 2020-11-18 (×6): qty 1

## 2020-11-18 MED ORDER — METOPROLOL TARTRATE 5 MG/5ML IV SOLN
5.0000 mg | Freq: Four times a day (QID) | INTRAVENOUS | Status: DC | PRN
  Administered 2020-11-20: 5 mg via INTRAVENOUS
  Filled 2020-11-18: qty 5

## 2020-11-18 MED ORDER — LACTATED RINGERS IV SOLN: INTRAVENOUS | Status: DC

## 2020-11-18 MED ORDER — VANCOMYCIN HCL 500 MG/100ML IV SOLN
500.0000 mg | INTRAVENOUS | Status: DC
  Administered 2020-11-19: 500 mg via INTRAVENOUS
  Filled 2020-11-18: qty 100

## 2020-11-18 NOTE — Progress Notes (Signed)
Arrived to 6N09, Patient is alert and oriented x4. No acute distress noted, 95% on room air. NG in place. Denies any pain at this moment. Denies n/v as well. Call bell within reach and will continue to close monitor.

## 2020-11-18 NOTE — Progress Notes (Signed)
ANTICOAGULATION CONSULT NOTE  Pharmacy Consult for Heparin Indication: chest pain/ACS  No Known Allergies  Patient Measurements: Height: 5\' 3"  (160 cm) Weight: 85 kg (187 lb 6.3 oz) IBW/kg (Calculated) : 56.9 Heparin Dosing Weight: 70 kg  Vital Signs: Temp: 99 F (37.2 C) (03/16 2230) Temp Source: Oral (03/16 2230) BP: 157/81 (03/16 2230) Pulse Rate: 95 (03/16 2230)  Labs: Recent Labs    11/17/20 0203 11/17/20 0330 11/17/20 0752 11/17/20 0914 11/17/20 1226 11/17/20 2055 11/17/20 2359  HGB 14.1  --   --   --   --   --  12.5*  HCT 46.4  --   --   --   --   --  38.0*  PLT 260  --   --   --   --   --  190  HEPARINUNFRC  --   --   --   --  0.80*  --  0.36  CREATININE 2.06*  --   --   --   --   --   --   TROPONINIHS 144*   < > 373* 344*  --  157*  --    < > = values in this interval not displayed.    Estimated Creatinine Clearance: 25.7 mL/min (A) (by C-G formula based on SCr of 2.06 mg/dL (H)).     Assessment: 87 y.o. male with eleavated troponin, possible ACS, for heparin. Heparin level therapeutic (0.36) on gtt at 850 units/hr. No bleeding noted.  Goal of Therapy:  Heparin level 0.3-0.7 units/ml Monitor platelets by anticoagulation protocol: Yes   Plan:  Continue heparin infusion at 850 units/hr F/u daily heparin level  Sherlon Handing, PharmD, BCPS Please see amion for complete clinical pharmacist phone list 11/18/2020,1:28 AM

## 2020-11-18 NOTE — Consult Note (Addendum)
Cardiology Consultation:   Patient ID: [name] MRN: [id number]; DOB: 1934/05/13  Admit date: 11/17/2020 Date of Consult: 11/18/2020  PCP:  Rosita Fire, [place]  Cardiologist:  No primary care provider on file. new Advanced Practice Provider:  No care team member to display Electrophysiologist:  None    Patient Profile:   Victor Hunter is a 87 y.o. male with a hx of HTN, esophagitis, BPH, now admitted with hypoxia, bowel obstruction, elevated troponin,  who is being seen today for the evaluation of elevated troponin,  at the request of Dr. Verlon Au.  History of Present Illness:   [name] with no known CAD, presented with abd pain to ER at [facility] yesterday and SOB.  + rales and hypoxia with sp02 at 78%.  CT of abd with small bowel obstruction, and NG tube placed.  Surgery has seen but pt had improved with NG in place.  He had started passing flatus.    He was transferred to [facility] with hypoxia and elevated troponin.  ddimer was elevated but VQ was neg for PE.     Initial PCXR with increased central vascular congestion without overt edema.  Follow up same day with ANG in place and bilateral pulmonary infiltrate possible aspiration with degree of gastric distension on prior.   CT of the abd with SBO., Bibasilar airspace disease, greatest in the left lower lobe. Favor infection over edema.  Richter hernia central upper abdomen, with protrusion of the anti mesenteric wall of the mid transverse colon through the hernia defect. No incarceration.  Marked enlarged prostate.  Aortic Atherosclerosis   Today abd film with continued SBO no significantly changed since prior CT.   EKG:  The EKG was personally reviewed and demonstrates:  ST at 130 with ST depression inf lat.  Follow up in ST with first degree AV block and less ST depression, some may be LVH. But not present in 2016.   Telemetry:  Telemetry was personally reviewed and demonstrates:  SR  1st degree AV block  BNP 203, hs Tropoin 144, 223, 373, 344  By evening 157.   Na 138, K+ 3.6, Cr 1.91 Ca+ 7.9 albumin 2.9  WBC 15.8 Hgb 11.6, plts 185.    Echo with poor acoustic windows, no definite wall motion abnormality, EF 55-60%, mild LVH RV is normal, trivial MR    Currently BP 150/87 P 90 R 18, (in ER BP 100/62 to 80/45)  On IV heparin.   He works as Presenter, broadcasting, walks a lot with no chest pain or SOB.  No cardiac issues.    Past Medical History:  Diagnosis Date  . Blood transfusion without reported diagnosis   . Chronic anemia   . Enlarged prostate    Elevated PSA  . Esophagitis    High grade squamous dysplasia occurring in the background of chronic active esophagitis,, EGD Dr. Christoper Fabian benign biopsies February 2005  . GERD (gastroesophageal reflux disease)   . History of BPH    s/p TURP 2008  . Hypertension   . Ileus ([place])   . Mallory - Weiss tear   . Thyroid disease   . Upper GI bleed     Past Surgical History:  Procedure Laterality Date  . CENTRAL VENOUS CATHETER INSERTION  04/26/2012   Procedure: INSERTION CENTRAL LINE ADULT;  Surgeon: Donato Heinz, MD;  Location: [facility];  Service: General;  Laterality: Left;  started at 0919, ended at 0930,  subclavian  . CHOLECYSTECTOMY    . COLONOSCOPY  10/29/03   rourk-inflammatory polyp from 40 cm removed  . COLONOSCOPY  10/02/2012   Dr. Gala Romney: normal  . ESOPHAGOGASTRODUODENOSCOPY  08/31/08   Rourk-possible extrinsic impression on midesophagus, noncritical Schatzki's ring, 3 mm fundal gland polyp, multiple gastric fundic polyps not manipulated, small hiatal hernia : CT chest showed mediastinal mass, eventually underwent thyroidectomy  . ESOPHAGOGASTRODUODENOSCOPY  08/27/2012   Dr. Cristina Gong: moderate sized hiatal hernia, cameron lesions  . LAPAROTOMY  04/26/2012   Procedure: EXPLORATORY LAPAROTOMY;  Surgeon: Donato Heinz, MD;  Location: [facility];  Service: General;  Laterality: N/A;  . THYROIDECTOMY    .  TRANSURETHRAL RESECTION OF PROSTATE     pt denies, but this is listed in his past medical records.      Home Medications:  Prior to Admission medications   Medication Sig Start Date End Date Taking? Authorizing Provider  chlorproMAZINE (THORAZINE) 10 MG tablet Take 10 mg by mouth every 6 (six) hours as needed for hiccoughs.   Yes [provider]  Colchicine 0.6 MG CAPS Take 1 capsule by mouth daily as needed (gout).   Yes [provider]  diltiazem (CARDIZEM CD) 180 MG 24 hr capsule Take 180 mg by mouth daily. 10/11/20  Yes [provider]  finasteride (PROSCAR) 5 MG tablet Take 5 mg by mouth daily. 08/03/15  Yes [provider]  hydrochlorothiazide (HYDRODIURIL) 25 MG tablet Take 25 mg by mouth daily. 09/12/20  Yes [provider]  levothyroxine (SYNTHROID, LEVOTHROID) 112 MCG tablet Take 112 mcg by mouth daily before breakfast.   Yes [provider]  lisinopril (ZESTRIL) 20 MG tablet Take 20 mg by mouth daily. 10/19/20  Yes [provider]  potassium chloride SA (KLOR-CON) 20 MEQ tablet Take 20 mEq by mouth daily. 09/12/20  Yes [provider]  tamsulosin (FLOMAX) 0.4 MG CAPS capsule Take 0.4 mg by mouth daily. 10/08/20  Yes [provider]  HYDROcodone-acetaminophen (NORCO/VICODIN) 5-325 MG tablet Take 1 tablet by mouth every 6 (six) hours as needed for severe pain. 02/12/17   Carole Civil, MD    Inpatient Medications: Scheduled Meds: . [START ON 11/20/2020] levothyroxine  56 mcg Intravenous Daily  . pantoprazole (PROTONIX) IV  40 mg Intravenous Q24H   Continuous Infusions: . heparin 850 Units/hr (11/18/20 0116)  . lactated ringers 75 mL/hr at 11/18/20 1300  . piperacillin-tazobactam 3.375 g (11/18/20 1258)  . [START ON 11/19/2020] vancomycin     PRN Meds: metoprolol tartrate, ondansetron (ZOFRAN) IV, technetium medronate  Allergies:   No Known Allergies  Social History:   Social History    Socioeconomic History  . Marital status: Married    Spouse name: Not on file  . Number of children: 4  . Years of education: Not on file  . Highest education level: Not on file  Occupational History  . 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 Smoking status: Former Smoker    Packs/day: 0.50    Types: Cigarettes    Quit date: 09/10/1992    Years since quitting: 28.2  . Smokeless tobacco: Never Used  Substance and Sexual Activity  . Alcohol use: No  . Drug use: No  . Sexual activity: Never  Other Topics Concern  . Not on file  Social History Narrative  . Not on file   Social Determinants of Health   Financial Resource Strain: Not on  file  Food Insecurity: Not on file  Transportation Needs: Not on file  Physical Activity: Not on file  Stress: Not on file  Social Connections: Not on file  Intimate Partner Violence: Not on file    Family History:    Family History  Problem Relation Age of Onset  . Cancer Other   . Heart failure Other   . Liver disease Sister   . Breast cancer Sister   . Hypertension Mother   . Stroke Father   . Colon cancer Neg Hx    sister with heart disease  ROS:  Please see the history of present illness.  General:no colds or fevers, no weight changes Skin:no rashes or ulcers HEENT:no blurred vision, no congestion CV:see HPI PUL:see HPI GI:no diarrhea constipation or melena, no indigestion, hx mallory weiss tear, GERD, has SBO almost yearly per family  GU:no hematuria, no dysuria, hx BPH  MS:no joint pain, no claudication Neuro:no syncope, no lightheadedness Endo:no diabetes, + thyroid disease  All other ROS reviewed and negative.     Physical Exam/Data:   Vitals:   11/17/20 1900 11/17/20 2230 11/18/20 0251 11/18/20 0523  BP: 139/66 (!) 157/81 (!) 168/77 (!) 150/87  Pulse: 91 95 90 90  Resp: 16 17 19 18   Temp:  99 F (37.2 C) 98.2 F (36.8 C) 98.6 F (37 C)  TempSrc:   Oral Oral Oral  SpO2: 93% 95% 98% 93%  Weight:  85 kg    Height:        Intake/Output Summary (Last 24 hours) at 11/18/2020 1327 Last data filed at 11/18/2020 0659 Gross per 24 hour  Intake 1150 ml  Output 1750 ml  Net -600 ml   Last 3 Weights 11/17/2020 11/17/2020 02/01/2017  Weight (lbs) 187 lb 6.3 oz 170 lb 10.2 oz 170 lb  Weight (kg) 85 kg 77.4 kg 77.111 kg     Body mass index is 33.19 kg/m.  General:  Well nourished, well developed, in no acute distress HEENT: normal- NG in place  Lymph: no adenopathy Neck: no JVD Endocrine:  No thryomegaly Vascular: No carotid bruits; FA pulses 2+ bilaterally without bruits  Cardiac:  normal S1, S2; RRR; no murmur gallup rub or click Lungs:  clear to auscultation bilaterally, no wheezing, rhonchi or rales  Abd: soft, nontender, no hepatomegaly  Ext: no edema Musculoskeletal:  No deformities, BUE and BLE strength normal and equal Skin: warm and dry  Neuro:  CNs 2-12 intact, no focal abnormalities noted Psych:  Normal affect   Relevant CV Studies: Echo 11/17/20 IMPRESSIONS    1. Poor acoustic windows No definite wall motion abnormalities but  endocardium is difficult to see. . Left ventricular ejection fraction, by  estimation, is 55 to 60%. The left ventricle has normal function. There is  mild left ventricular hypertrophy.  Left ventricular diastolic parameters are indeterminate.  2. Right ventricular systolic function is normal. The right ventricular  size is normal.  3. The mitral valve is normal in structure. Trivial mitral valve  regurgitation.  4. The aortic valve is tricuspid. Aortic valve regurgitation is not  visualized. Mild to moderate aortic valve sclerosis/calcification is  present, without any evidence of aortic stenosis.  5. The inferior vena cava is normal in size with greater than 50%  respiratory variability, suggesting right atrial pressure of 3 mmHg.   FINDINGS  Left Ventricle: Poor acoustic windows No  definite wall motion  abnormalities but endocardium is difficult to see. Left ventricular  ejection fraction, by  estimation, is 55 to 60%. The left ventricle has  normal function. The left ventricular internal  cavity size was normal in size. There is mild left ventricular  hypertrophy. Left ventricular diastolic parameters are indeterminate.   Right Ventricle: The right ventricular size is normal. Right vetricular  wall thickness was not assessed. Right ventricular systolic function is  normal.   Left Atrium: Left atrial size was normal in size.   Right Atrium: Right atrial size was normal in size.   Pericardium: There is no evidence of pericardial effusion.   Mitral Valve: The mitral valve is normal in structure. There is mild  thickening of the mitral valve leaflet(s). Mild mitral annular  calcification. Trivial mitral valve regurgitation.   Tricuspid Valve: The tricuspid valve is grossly normal. Tricuspid valve  regurgitation is not demonstrated.   Aortic Valve: The aortic valve is tricuspid. Aortic valve regurgitation is  not visualized. Mild to moderate aortic valve sclerosis/calcification is  present, without any evidence of aortic stenosis.   Pulmonic Valve: The pulmonic valve was normal in structure. Pulmonic valve  regurgitation is not visualized.   Aorta: The aortic root is normal in size and structure.   Venous: The inferior vena cava is normal in size with greater than 50%  respiratory variability, suggesting right atrial pressure of 3 mmHg.   IAS/Shunts: The interatrial septum was not assessed.      Laboratory Data:  High Sensitivity Troponin:   Recent Labs  Lab 11/17/20 0203 11/17/20 0330 11/17/20 0752 11/17/20 0914 11/17/20 2055  TROPONINIHS 144* 223* 373* 344* 157*     Chemistry Recent Labs  Lab 11/17/20 0203 11/18/20 0841  NA 138 138  K 2.8* 3.6  CL 98 102  CO2 19* 26  GLUCOSE 259* 98  BUN 33* 37*  CREATININE 2.06* 1.91*  CALCIUM 9.0  7.9*  GFRNONAA 31* 34*  ANIONGAP 21* 10    Recent Labs  Lab 11/17/20 0203 11/18/20 0841  PROT 7.8 6.1*  ALBUMIN 3.8 2.9*  AST 29 31  ALT 27 18  ALKPHOS 116 80  BILITOT 0.7 0.9   Hematology Recent Labs  Lab 11/17/20 0203 11/17/20 2359 11/18/20 0841  WBC 11.2* 16.6* 15.8*  RBC 5.57 4.82 4.55  HGB 14.1 12.5* 11.6*  HCT 46.4 38.0* 36.0*  MCV 83.3 78.8* 79.1*  MCH 25.3* 25.9* 25.5*  MCHC 30.4 32.9 32.2  RDW 16.2* 16.4* 16.2*  PLT 260 190 185   BNP Recent Labs  Lab 11/17/20 0203  BNP 203.0*    DDimer  Recent Labs  Lab 11/17/20 0203  DDIMER 2.73*     Radiology/Studies:  CT ABDOMEN PELVIS WO CONTRAST  Result Date: 11/17/2020 CLINICAL DATA:  Abdominal distension, constipation, short of breath EXAM: CT ABDOMEN AND PELVIS WITHOUT CONTRAST TECHNIQUE: Multidetector CT imaging of the abdomen and pelvis was performed following the standard protocol without IV contrast. COMPARISON:  11/17/2020 FINDINGS: Lower chest: There is bibasilar airspace disease greatest within the lower lobes, left greater than right. Findings could reflect edema or infection. No effusion or pneumothorax. Unenhanced CT was performed per clinician order. Lack of IV contrast limits sensitivity and specificity, especially for evaluation of abdominal/pelvic solid viscera. Hepatobiliary: No focal liver abnormality is seen. Status post cholecystectomy. No biliary dilatation. Pancreas: Unremarkable. No pancreatic ductal dilatation or surrounding inflammatory changes. Spleen: Normal in size without focal abnormality. Adrenals/Urinary Tract: No urinary tract calculi or obstructive uropathy. The adrenals and bladder are unremarkable. Stomach/Bowel: There is distension of the stomach and proximal small bowel, with  numerous proximal small bowel gas fluid levels, consistent with obstruction. There is change in caliber within the distal jejunum within the right lower quadrant. A stable Richter type hernia within the central  upper abdomen is identified, with protrusion of the anti mesenteric aspect of the mid transverse colonic wall through the hernia defect. No wall thickening or fat stranding to suggest incarceration. Vascular/Lymphatic: Aortic atherosclerosis. No enlarged abdominal or pelvic lymph nodes. Reproductive: Continued marked enlargement of the prostate, which measures 5.8 x 6.1 x 6.0 cm. Other: No free fluid or free gas. Richter type hernia in the central upper abdomen as described above. Musculoskeletal: No acute or destructive bony lesions. Reconstructed images demonstrate no additional findings. IMPRESSION: 1. Small bowel obstruction, with transition in the distal jejunum right lower quadrant. 2. Bibasilar airspace disease, greatest in the left lower lobe. Favor infection over edema. 3. Richter hernia central upper abdomen, with protrusion of the anti mesenteric wall of the mid transverse colon through the hernia defect. No incarceration. 4. Marked enlargement of the prostate. 5.  Aortic Atherosclerosis (ICD10-I70.0). Electronically Signed   By: Randa Ngo M.D.   On: 11/17/2020 03:39   DG Chest 1 View  Result Date: 11/17/2020 CLINICAL DATA:  Nasogastric tube insertion EXAM: CHEST  1 VIEW COMPARISON:  Chest x-ray earlier today FINDINGS: The enteric tube tip is at the stomach with side port near the GE junction. Opacity in the lower lungs as seen on preceding CT. Normal heart size. Presumed thyroidectomy. No visible effusion or pneumothorax IMPRESSION: 1. Enteric tube with tip at the stomach and side port near the GE junction. 2. Bilateral pulmonary infiltrate, question aspiration given the degree of gastric distension on prior. Electronically Signed   By: Monte Fantasia M.D.   On: 11/17/2020 04:33   NM Pulmonary Perfusion  Result Date: 11/17/2020 CLINICAL DATA:  Shortness of breath. EXAM: NUCLEAR MEDICINE PERFUSION LUNG SCAN TECHNIQUE: Perfusion images were obtained in multiple projections after intravenous  injection of radiopharmaceutical. Ventilation scans intentionally deferred if perfusion scan and chest x-ray adequate for interpretation during COVID 19 epidemic. RADIOPHARMACEUTICALS:  4.0 mCi Tc-34m MAA IV COMPARISON:  CT abdomen/pelvis 11/17/2020 and chest x-ray 11/17/2020. FINDINGS: There is a large area of decreased perfusion involving the superior segment of the left lower lobe. This corresponds to a large infiltrate seen best on the prior CT scan. I do not see any findings suspicious for pulmonary embolism. IMPRESSION: Left-sided perfusion defect matching a large infiltrate on recent chest x-ray and CT scan. No findings suspicious for pulmonary embolism. Electronically Signed   By: Marijo Sanes M.D.   On: 11/17/2020 13:10   DG Chest Portable 1 View  Result Date: 11/17/2020 CLINICAL DATA:  Short of breath, rales EXAM: PORTABLE CHEST 1 VIEW COMPARISON:  07/20/2015 FINDINGS: Single frontal view of the chest demonstrates a stable cardiac silhouette. There is prominence of the central pulmonary vasculature, without acute airspace disease, effusion, or pneumothorax. Postsurgical changes are seen from thyroidectomy. No acute bony abnormalities. IMPRESSION: 1. Increased central vascular congestion without overt edema. Electronically Signed   By: Randa Ngo M.D.   On: 11/17/2020 02:21   DG Abd Portable 1V-Small Bowel Obstruction Protocol-initial, 8 hr delay  Result Date: 11/18/2020 CLINICAL DATA:  Small bowel obstruction EXAM: PORTABLE ABDOMEN - 1 VIEW COMPARISON:  11/17/2020 CT FINDINGS: NG tube tip is in the proximal stomach just beyond the GE junction. Side port is in the distal esophagus. Mildly prominent mid abdominal small bowel loops. Findings compatible with small bowel obstruction. Gas  seen within the decompressed colon. Prior cholecystectomy. No organomegaly or free air. IMPRESSION: Continued small bowel obstruction pattern. Overall not significantly changed since prior CT. NG tube tip in the  proximal stomach near the GE junction. Electronically Signed   By: Rolm Baptise M.D.   On: 11/18/2020 09:15   ECHOCARDIOGRAM COMPLETE  Result Date: 11/17/2020    ECHOCARDIOGRAM REPORT   Patient Name:   FINCH COSTANZO Date of Exam: 11/17/2020 Medical Rec #:  [id number]        Height:       63.0 in Accession #:    [id number]       Weight:       170.6 lb Date of Birth:  November 30, 1933        BSA:          1.807 m Patient Age:    43 years         BP:           139/68 mmHg Patient Gender: M                HR:           98 bpm. Exam Location:  [facility] Procedure: 2D Echo Indications:    CHF-Acute Diastolic O24.23  History:        Patient has no prior history of Echocardiogram examinations.                 Signs/Symptoms:Fever and Syncope; Risk Factors:Former Smoker.                 Acute Respiratory Failure, GERD, Sepsis.  Sonographer:    Leavy Cella RDCS (AE) Referring Phys: [id number] OLADAPO ADEFESO IMPRESSIONS  1. Poor acoustic windows No definite wall motion abnormalities but endocardium is difficult to see. . Left ventricular ejection fraction, by estimation, is 55 to 60%. The left ventricle has normal function. There is mild left ventricular hypertrophy. Left ventricular diastolic parameters are indeterminate.  2. Right ventricular systolic function is normal. The right ventricular size is normal.  3. The mitral valve is normal in structure. Trivial mitral valve regurgitation.  4. The aortic valve is tricuspid. Aortic valve regurgitation is not visualized. Mild to moderate aortic valve sclerosis/calcification is present, without any evidence of aortic stenosis.  5. The inferior vena cava is normal in size with greater than 50% respiratory variability, suggesting right atrial pressure of 3 mmHg. FINDINGS  Left Ventricle: Poor acoustic windows No definite wall motion abnormalities but endocardium is difficult to see. Left ventricular ejection fraction, by estimation, is 55 to 60%. The left ventricle has normal  function. The left ventricular internal cavity size was normal in size. There is mild left ventricular hypertrophy. Left ventricular diastolic parameters are indeterminate. Right Ventricle: The right ventricular size is normal. Right vetricular wall thickness was not assessed. Right ventricular systolic function is normal. Left Atrium: Left atrial size was normal in size. Right Atrium: Right atrial size was normal in size. Pericardium: There is no evidence of pericardial effusion. Mitral Valve: The mitral valve is normal in structure. There is mild thickening of the mitral valve leaflet(s). Mild mitral annular calcification. Trivial mitral valve regurgitation. Tricuspid Valve: The tricuspid valve is grossly normal. Tricuspid valve regurgitation is not demonstrated. Aortic Valve: The aortic valve is tricuspid. Aortic valve regurgitation is not visualized. Mild to moderate aortic valve sclerosis/calcification is present, without any evidence of aortic stenosis. Pulmonic Valve: The pulmonic valve was normal in structure. Pulmonic valve regurgitation is not visualized.  Aorta: The aortic root is normal in size and structure. Venous: The inferior vena cava is normal in size with greater than 50% respiratory variability, suggesting right atrial pressure of 3 mmHg. IAS/Shunts: The interatrial septum was not assessed.  LEFT VENTRICLE PLAX 2D LVIDd:         3.57 cm  Diastology LVIDs:         2.39 cm  LV e' medial:    11.20 cm/s LV PW:         1.06 cm  LV E/e' medial:  11.5 LV IVS:        1.28 cm  LV e' lateral:   16.60 cm/s LVOT diam:     1.80 cm  LV E/e' lateral: 7.8 LVOT Area:     2.54 cm  RIGHT VENTRICLE RV S prime:     11.80 cm/s TAPSE (M-mode): 1.1 cm LEFT ATRIUM             Index       RIGHT ATRIUM          Index LA diam:        2.50 cm 1.38 cm/m  RA Area:     8.84 cm LA Vol (A2C):   41.6 ml 23.02 ml/m RA Volume:   16.30 ml 9.02 ml/m LA Vol (A4C):   30.7 ml 16.99 ml/m LA Biplane Vol: 37.5 ml 20.75 ml/m   AORTA Ao  Root diam: 3.40 cm MITRAL VALVE MV Area (PHT): 3.85 cm     SHUNTS MV Decel Time: 197 msec     Systemic Diam: 1.80 cm MV E velocity: 129.00 cm/s Dorris Carnes MD Electronically signed by Dorris Carnes MD Signature Date/Time: 11/17/2020/6:42:44 PM    Final      Assessment and Plan:   1. Elevated troponin in acute hypoxia event with possible aspiration PNA.  No prior cardiac hx.  Most likely demand ischemia.  though abnormal EKG with ST depression.  EF stable on echo and no RWMA. No chest pain at all. May need eval once improved.  Dr. Meda Coffee to see.  2. Acute SOB neg PE, possible aspiration PNA by CXR, now with elevated WBC. On ABX 3. SBO with abd dilation on arrival - NG with improvment of symptoms, still with large amount of NG drainage.  Surgery is following. 4. Sepsis on admit with hypotension, hypoxia, improved.  5. HTN on lisinopril 20 mg  HCTZ 25 mg, cardizem 180 mg  6. Hypothyroid per IM    Risk Assessment/Risk Scores:     TIMI Risk Score for Unstable Angina or Non-ST Elevation MI:   The patient's TIMI risk score is 4, which indicates a 20% risk of all cause mortality, new or recurrent myocardial infarction or need for urgent revascularization in the next 14 days.   For questions or updates, please contact Roy Please consult [email or web address] for contact info under   Signed, Cecilie Kicks, NP  11/18/2020 1:27 PM  The patient was seen, examined and discussed with Cecilie Kicks, NP and I agree with the above.   Mr. Mcgrail is a very pleasant gentleman with no known CAD, presented with abd pain to ER at [facility] yesterday and SOB.  + rales and hypoxia with sp02 at 78%.  CT of abd with small bowel obstruction, and NG tube placed.    He was transferred to [facility] with hypoxia and elevated troponin. D-dimer was elevated but VQ was neg for PE.   Echo LVEF 55-60%, no wall motion abnormality, normal  RVEF,  EKG shows sinus tachycardia, 1. AVB, LVH.    On physical exam he is not in acute  distress, no JVD, NG tube in place, S1,2 with no murmur, clear lungs, no LE edema.  BNP 203, hs Troponin 144, 223, 373, 344  By evening 157.  Lactic acid 5.8 on admission, now 1.4  Currently BP 150/87 P 90 R 18, (in ER BP 100/62 to 80/45)  On IV heparin.   Assessment/Plan:  Demand ischemia in the settings of severe hypoxia/aspiration pneumonia/elevated lactic acid - normal LVEF, no WMA, no prior chest pain - no ischemic workup is necessary - consider ASA, possibly stains in the future  Hypertension  - with LVH on echo and ECG - hypotensive on admission sec to sepsis, now hypertensive   - restart BP meds, he is NPO, restart lisinopril 20 mg po daily once he is able to take PO Meds, for now hydralazine 10 mg iv TID   Ena Dawley, MD 11/18/2020

## 2020-11-18 NOTE — Progress Notes (Signed)
   11/18/20 0420  NG/OG Tube Nasogastric 16 Fr. Right nare Aucultation;Xray Measured external length of tube 60 cm  Placement Date/Time: 11/17/20 0400   Person Inserting Catheter: Vivien Rota, RN  Tube Type: Nasogastric  Tube Size (Fr.): 16 Fr.  Tube Location: Right nare  Initial Placement Verification: Aucultation;Xray  Technique Used to Measure Tube Placement: Measured ...  Site Assessment Dry;Intact;Clean  Status Suction-low intermittent  Drainage Appearance Bile;Bloody (Asia Ghosh,DO(Hospitalist on call) made aware at 0423 via secure chat)  Current V/S BP168/77 T98.2 PR 90 RR19 98% on room air. Hgb 12.5 . Not in acute distress. New order received for repeat CBC at 0830.  Will continue to close monitor patient.

## 2020-11-18 NOTE — Hospital Course (Addendum)
   Data Potassium 3.3 BUNs/creatinine 23/1.5 AST/ALT 50/67 Prealbumin 8.4 WBC 15.6-->12.1 Hemoglobin 10.5 MCV 79 platelet 219 Lactic acid 3.8-->1.4 Blood culture 3/16 no growth to date

## 2020-11-18 NOTE — Progress Notes (Signed)
PROGRESS NOTE   Victor Hunter  GQQ:[id number] DOB: [birth year] DOA: 11/17/2020 PCP: Rosita Fire, MD  Brief Narrative:   87 year old black male community dwelling BPH + epididymitis 2016 status post TURP 2008 Mallory-Weiss tear 2005+ readmission 2013 cryptogenic anemia-was supposed to have elective hernia repair?  And was to follow-up with Dr. Buford Dresser in [place] at the time Prior RUE DVT 04/2012 Hypothyroid  Prior SBO 04/22/2012 = adynamic ileus??-Intubated/ventilated-had septic shock at the time  Admit to [facility] 3/16 4 days abdominal distention constipation obstipation, increased WOB O2 sats 78% on arrival of EMS 15 L Hypotensive 100/60 BUNs/creatinine 33/2.0 (baseline 1.4 Troponin peak 140 4200 Lactic acid 5  CT abdomen pelvis = SBO + distal jejunal transition point?  Airspace disease on CT NG tube placed 800 cc coffee-ground/negative Gastroccult Rx heparin plus ASA (troponin) Dr. Constance Haw General surgery recommended transfer-->[facility]-->appreciate Dr. Dema Severin seeing this morning   Apparently been passing flatus  Hospital-Problem based course  SBO in the setting of prior adynamic ileus 2013  Defer to gen surgery next steps--hopeful to avoid surgery but might be needed  Strict NPO Increase LR to 75 cc/h, is + 1.7 liters for now Severe sepsis on admission ?  PNA  Get CXR 2vw with next gastrogaffin to ensure no PNA  Cont Vanc/zosyn without descalating today--d/c vanc in am if stable NSTEMI  Continue Heparin GTT  Appreciate in advance input from Cardiology  Echocardiogram 3/16 no wall motion abnormalities suspect related to stress of SBO?   Sepsis  Lisinopril 20, HCTZ, Cardizem 180 on hold at this Time  As needed metoprolol IV for blood pressure control Prior right upper extremity DVT 2013  No longer on anticoagulation  May benefit from ASA? Hypothyroid  Replace Synthroid IV half usual dose 56   DVT prophylaxis: SCD at this time Code Status: Full Family Communication: D/W  daughter Geanie Berlin at [employer] #[phone] She understands probability of surgery is high given this is a recurrent issue  Disposition:  Status is: Inpatient  Remains inpatient appropriate because:Persistent severe electrolyte disturbances, Altered mental status and IV treatments appropriate due to intensity of illness or inability to take PO   Dispo: The patient is from: Home              Anticipated d/c is to: SNF              Patient currently is not medically stable to d/c.   Difficult to place patient No    Consultants:   General surgery  Cardiology input pending  Procedures: Multiple CTs  Antimicrobials: Vancomycin Zosyn   Subjective: Patient arousable-appears weak-NG is draining over a liter of bilious fluid States he is passing gas but has not had a stool Gastrografin was performed yesterday I personally discussed with Dr. Dema Severin this morning and we are hopeful for resolution although patient/family are aware he may need repeat surgery He is currently not in severe pain  Objective: Vitals:   11/17/20 1900 11/17/20 2230 11/18/20 0251 11/18/20 0523  BP: 139/66 (!) 157/81 (!) 168/77 (!) 150/87  Pulse: 91 95 90 90  Resp: 16 17 19 18   Temp:  99 F (37.2 C) 98.2 F (36.8 C) 98.6 F (37 C)  TempSrc:  Oral Oral Oral  SpO2: 93% 95% 98% 93%  Weight:  85 kg    Height:        Intake/Output Summary (Last 24 hours) at 11/18/2020 1049 Last data filed at 11/18/2020 0659 Gross per 24 hour  Intake 1150 ml  Output 1750 ml  Net -600 ml   Filed Weights   11/17/20 0156 11/17/20 2230  Weight: 77.4 kg 85 kg    Examination:  EOMI NCAT thick neck Mallampati 4 no JVD poor dentition no icterus no pallor S1-S2 sinus rhythm on monitors CTA B to lateral lung fields Abdomen distended tympanic No lower extremity edema no rash Neurologically intact moving 4 limbs equally without gross motor deficit sensory deferred Psych euthymic  Data Reviewed: personally reviewed    Data Troponin III 73-->157 Lactic acid 3.8-->1.4 Blood culture 3/16 pending x2 Echocardiogram 3/16 = EF 55-60+ mild LVH  CBC    Component Value Date/Time   WBC 15.8 (H) 11/18/2020 0841   RBC 4.55 11/18/2020 0841   HGB 11.6 (L) 11/18/2020 0841   HCT 36.0 (L) 11/18/2020 0841   PLT 185 11/18/2020 0841   MCV 79.1 (L) 11/18/2020 0841   MCH 25.5 (L) 11/18/2020 0841   MCHC 32.2 11/18/2020 0841   RDW 16.2 (H) 11/18/2020 0841   LYMPHSABS 1.7 11/18/2020 0841   MONOABS 1.1 (H) 11/18/2020 0841   EOSABS 0.0 11/18/2020 0841   BASOSABS 0.0 11/18/2020 0841   CMP Latest Ref Rng & Units 11/18/2020 11/17/2020 08/05/2015  Glucose 70 - 99 mg/dL 98 259(H) 113(H)  BUN 8 - 23 mg/dL 37(H) 33(H) 19  Creatinine 0.61 - 1.24 mg/dL 1.91(H) 2.06(H) 1.40(H)  Sodium 135 - 145 mmol/L 138 138 138  Potassium 3.5 - 5.1 mmol/L 3.6 2.8(L) 4.2  Chloride 98 - 111 mmol/L 102 98 103  CO2 22 - 32 mmol/L 26 19(L) -  Calcium 8.9 - 10.3 mg/dL 7.9(L) 9.0 -  Total Protein 6.5 - 8.1 g/dL 6.1(L) 7.8 -  Total Bilirubin 0.3 - 1.2 mg/dL 0.9 0.7 -  Alkaline Phos 38 - 126 U/L 80 116 -  AST 15 - 41 U/L 31 29 -  ALT 0 - 44 U/L 18 27 -     Radiology Studies: CT ABDOMEN PELVIS WO CONTRAST  Result Date: 11/17/2020 CLINICAL DATA:  Abdominal distension, constipation, short of breath EXAM: CT ABDOMEN AND PELVIS WITHOUT CONTRAST TECHNIQUE: Multidetector CT imaging of the abdomen and pelvis was performed following the standard protocol without IV contrast. COMPARISON:  11/17/2020 FINDINGS: Lower chest: There is bibasilar airspace disease greatest within the lower lobes, left greater than right. Findings could reflect edema or infection. No effusion or pneumothorax. Unenhanced CT was performed per clinician order. Lack of IV contrast limits sensitivity and specificity, especially for evaluation of abdominal/pelvic solid viscera. Hepatobiliary: No focal liver abnormality is seen. Status post cholecystectomy. No biliary dilatation.  Pancreas: Unremarkable. No pancreatic ductal dilatation or surrounding inflammatory changes. Spleen: Normal in size without focal abnormality. Adrenals/Urinary Tract: No urinary tract calculi or obstructive uropathy. The adrenals and bladder are unremarkable. Stomach/Bowel: There is distension of the stomach and proximal small bowel, with numerous proximal small bowel gas fluid levels, consistent with obstruction. There is change in caliber within the distal jejunum within the right lower quadrant. A stable Richter type hernia within the central upper abdomen is identified, with protrusion of the anti mesenteric aspect of the mid transverse colonic wall through the hernia defect. No wall thickening or fat stranding to suggest incarceration. Vascular/Lymphatic: Aortic atherosclerosis. No enlarged abdominal or pelvic lymph nodes. Reproductive: Continued marked enlargement of the prostate, which measures 5.8 x 6.1 x 6.0 cm. Other: No free fluid or free gas. Richter type hernia in the central upper abdomen as described above. Musculoskeletal: No acute or destructive bony lesions. Reconstructed images  demonstrate no additional findings. IMPRESSION: 1. Small bowel obstruction, with transition in the distal jejunum right lower quadrant. 2. Bibasilar airspace disease, greatest in the left lower lobe. Favor infection over edema. 3. Richter hernia central upper abdomen, with protrusion of the anti mesenteric wall of the mid transverse colon through the hernia defect. No incarceration. 4. Marked enlargement of the prostate. 5.  Aortic Atherosclerosis (ICD10-I70.0). Electronically Signed   By: Randa Ngo M.D.   On: 11/17/2020 03:39   DG Chest 1 View  Result Date: 11/17/2020 CLINICAL DATA:  Nasogastric tube insertion EXAM: CHEST  1 VIEW COMPARISON:  Chest x-ray earlier today FINDINGS: The enteric tube tip is at the stomach with side port near the GE junction. Opacity in the lower lungs as seen on preceding CT. Normal  heart size. Presumed thyroidectomy. No visible effusion or pneumothorax IMPRESSION: 1. Enteric tube with tip at the stomach and side port near the GE junction. 2. Bilateral pulmonary infiltrate, question aspiration given the degree of gastric distension on prior. Electronically Signed   By: Monte Fantasia M.D.   On: 11/17/2020 04:33   NM Pulmonary Perfusion  Result Date: 11/17/2020 CLINICAL DATA:  Shortness of breath. EXAM: NUCLEAR MEDICINE PERFUSION LUNG SCAN TECHNIQUE: Perfusion images were obtained in multiple projections after intravenous injection of radiopharmaceutical. Ventilation scans intentionally deferred if perfusion scan and chest x-ray adequate for interpretation during COVID 19 epidemic. RADIOPHARMACEUTICALS:  4.0 mCi Tc-99m MAA IV COMPARISON:  CT abdomen/pelvis 11/17/2020 and chest x-ray 11/17/2020. FINDINGS: There is a large area of decreased perfusion involving the superior segment of the left lower lobe. This corresponds to a large infiltrate seen best on the prior CT scan. I do not see any findings suspicious for pulmonary embolism. IMPRESSION: Left-sided perfusion defect matching a large infiltrate on recent chest x-ray and CT scan. No findings suspicious for pulmonary embolism. Electronically Signed   By: Marijo Sanes M.D.   On: 11/17/2020 13:10   DG Chest Portable 1 View  Result Date: 11/17/2020 CLINICAL DATA:  Short of breath, rales EXAM: PORTABLE CHEST 1 VIEW COMPARISON:  07/20/2015 FINDINGS: Single frontal view of the chest demonstrates a stable cardiac silhouette. There is prominence of the central pulmonary vasculature, without acute airspace disease, effusion, or pneumothorax. Postsurgical changes are seen from thyroidectomy. No acute bony abnormalities. IMPRESSION: 1. Increased central vascular congestion without overt edema. Electronically Signed   By: Randa Ngo M.D.   On: 11/17/2020 02:21   DG Abd Portable 1V-Small Bowel Obstruction Protocol-initial, 8 hr  delay  Result Date: 11/18/2020 CLINICAL DATA:  Small bowel obstruction EXAM: PORTABLE ABDOMEN - 1 VIEW COMPARISON:  11/17/2020 CT FINDINGS: NG tube tip is in the proximal stomach just beyond the GE junction. Side port is in the distal esophagus. Mildly prominent mid abdominal small bowel loops. Findings compatible with small bowel obstruction. Gas seen within the decompressed colon. Prior cholecystectomy. No organomegaly or free air. IMPRESSION: Continued small bowel obstruction pattern. Overall not significantly changed since prior CT. NG tube tip in the proximal stomach near the GE junction. Electronically Signed   By: Rolm Baptise M.D.   On: 11/18/2020 09:15   ECHOCARDIOGRAM COMPLETE  Result Date: 11/17/2020    ECHOCARDIOGRAM REPORT   Patient Name:   ANDREWS TENER Date of Exam: 11/17/2020 Medical Rec #:  [id number]        Height:       63.0 in Accession #:    [id number]       Weight:  170.6 lb Date of Birth:  01-19-1934        BSA:          1.807 m Patient Age:    33 years         BP:           139/68 mmHg Patient Gender: M                HR:           98 bpm. Exam Location:  [facility] Procedure: 2D Echo Indications:    CHF-Acute Diastolic I29.79  History:        Patient has no prior history of Echocardiogram examinations.                 Signs/Symptoms:Fever and Syncope; Risk Factors:Former Smoker.                 Acute Respiratory Failure, GERD, Sepsis.  Sonographer:    Leavy Cella RDCS (AE) Referring Phys: [id number] OLADAPO ADEFESO IMPRESSIONS  1. Poor acoustic windows No definite wall motion abnormalities but endocardium is difficult to see. . Left ventricular ejection fraction, by estimation, is 55 to 60%. The left ventricle has normal function. There is mild left ventricular hypertrophy. Left ventricular diastolic parameters are indeterminate.  2. Right ventricular systolic function is normal. The right ventricular size is normal.  3. The mitral valve is normal in structure. Trivial mitral  valve regurgitation.  4. The aortic valve is tricuspid. Aortic valve regurgitation is not visualized. Mild to moderate aortic valve sclerosis/calcification is present, without any evidence of aortic stenosis.  5. The inferior vena cava is normal in size with greater than 50% respiratory variability, suggesting right atrial pressure of 3 mmHg. FINDINGS  Left Ventricle: Poor acoustic windows No definite wall motion abnormalities but endocardium is difficult to see. Left ventricular ejection fraction, by estimation, is 55 to 60%. The left ventricle has normal function. The left ventricular internal cavity size was normal in size. There is mild left ventricular hypertrophy. Left ventricular diastolic parameters are indeterminate. Right Ventricle: The right ventricular size is normal. Right vetricular wall thickness was not assessed. Right ventricular systolic function is normal. Left Atrium: Left atrial size was normal in size. Right Atrium: Right atrial size was normal in size. Pericardium: There is no evidence of pericardial effusion. Mitral Valve: The mitral valve is normal in structure. There is mild thickening of the mitral valve leaflet(s). Mild mitral annular calcification. Trivial mitral valve regurgitation. Tricuspid Valve: The tricuspid valve is grossly normal. Tricuspid valve regurgitation is not demonstrated. Aortic Valve: The aortic valve is tricuspid. Aortic valve regurgitation is not visualized. Mild to moderate aortic valve sclerosis/calcification is present, without any evidence of aortic stenosis. Pulmonic Valve: The pulmonic valve was normal in structure. Pulmonic valve regurgitation is not visualized. Aorta: The aortic root is normal in size and structure. Venous: The inferior vena cava is normal in size with greater than 50% respiratory variability, suggesting right atrial pressure of 3 mmHg. IAS/Shunts: The interatrial septum was not assessed.  LEFT VENTRICLE PLAX 2D LVIDd:         3.57 cm   Diastology LVIDs:         2.39 cm  LV e' medial:    11.20 cm/s LV PW:         1.06 cm  LV E/e' medial:  11.5 LV IVS:        1.28 cm  LV e' lateral:   16.60 cm/s LVOT diam:  1.80 cm  LV E/e' lateral: 7.8 LVOT Area:     2.54 cm  RIGHT VENTRICLE RV S prime:     11.80 cm/s TAPSE (M-mode): 1.1 cm LEFT ATRIUM             Index       RIGHT ATRIUM          Index LA diam:        2.50 cm 1.38 cm/m  RA Area:     8.84 cm LA Vol (A2C):   41.6 ml 23.02 ml/m RA Volume:   16.30 ml 9.02 ml/m LA Vol (A4C):   30.7 ml 16.99 ml/m LA Biplane Vol: 37.5 ml 20.75 ml/m   AORTA Ao Root diam: 3.40 cm MITRAL VALVE MV Area (PHT): 3.85 cm     SHUNTS MV Decel Time: 197 msec     Systemic Diam: 1.80 cm MV E velocity: 129.00 cm/s Dorris Carnes MD Electronically signed by Dorris Carnes MD Signature Date/Time: 11/17/2020/6:42:44 PM    Final      Scheduled Meds: . Derrill Memo ON 11/20/2020] levothyroxine  56 mcg Intravenous Daily  . pantoprazole (PROTONIX) IV  40 mg Intravenous Q24H   Continuous Infusions: . heparin 850 Units/hr (11/18/20 0116)  . piperacillin-tazobactam 3.375 g (11/18/20 0338)  . vancomycin       LOS: 1 day   Time spent: 50 minutes  Nita Sells, MD [facility] To contact the attending provider between 7A-7P or the covering provider during after hours 7P-7A, please log into the web site [email or web address] and access using universal [place] password for that web site. If you do not have the password, please call the hospital operator.  11/18/2020, 10:49 AM

## 2020-11-18 NOTE — Progress Notes (Signed)
Subjective: CC: Patient reports he is doing well.  He has no abdominal pain or nausea.  He reports he started passing flatus yesterday.  No BM overnight.  Last BM documented on 3/14.  Still having bilious output from NG tube with 1250 documented in the last 24 hours.  Objective: Vital signs in last 24 hours: Temp:  [98.1 F (36.7 C)-99 F (37.2 C)] 98.6 F (37 C) (03/17 0523) Pulse Rate:  [87-95] 90 (03/17 0523) Resp:  [16-22] 18 (03/17 0523) BP: (120-168)/(64-87) 150/87 (03/17 0523) SpO2:  [91 %-98 %] 93 % (03/17 0523) Weight:  [85 kg] 85 kg (03/16 2230) Last BM Date: 11/15/20 (according to patient)  Intake/Output from previous day: 03/16 0701 - 03/17 0700 In: 3350 [I.V.:1000; IV Piggyback:2350] Out: 1750 [Urine:500; Emesis/NG output:1250] Intake/Output this shift: No intake/output data recorded.  PE: Gen:  Alert, NAD, pleasant Card:  Reg Pulm:  Decreased breath sounds at the bases, upper lung fields clear.  Abd: Soft, moderate distension, NT, slightly hypoactive bowel sounds, ventral hernia reducible when patient lied flat. NGT in place with bilious output in cannister.  Psych: A&Ox3 Skin: no rashes noted, warm and dry  Lab Results:  Recent Labs    11/17/20 0203 11/17/20 2359  WBC 11.2* 16.6*  HGB 14.1 12.5*  HCT 46.4 38.0*  PLT 260 190   BMET Recent Labs    11/17/20 0203  NA 138  K 2.8*  CL 98  CO2 19*  GLUCOSE 259*  BUN 33*  CREATININE 2.06*  CALCIUM 9.0   PT/INR No results for input(s): LABPROT, INR in the last 72 hours. CMP     Component Value Date/Time   NA 138 11/17/2020 0203   K 2.8 (L) 11/17/2020 0203   CL 98 11/17/2020 0203   CO2 19 (L) 11/17/2020 0203   GLUCOSE 259 (H) 11/17/2020 0203   BUN 33 (H) 11/17/2020 0203   CREATININE 2.06 (H) 11/17/2020 0203   CALCIUM 9.0 11/17/2020 0203   PROT 7.8 11/17/2020 0203   ALBUMIN 3.8 11/17/2020 0203   AST 29 11/17/2020 0203   ALT 27 11/17/2020 0203   ALKPHOS 116 11/17/2020 0203    BILITOT 0.7 11/17/2020 0203   GFRNONAA 31 (L) 11/17/2020 0203   GFRAA >60 07/23/2015 0712   Lipase     Component Value Date/Time   LIPASE 23 07/20/2015 1919       Studies/Results: CT ABDOMEN PELVIS WO CONTRAST  Result Date: 11/17/2020 CLINICAL DATA:  Abdominal distension, constipation, short of breath EXAM: CT ABDOMEN AND PELVIS WITHOUT CONTRAST TECHNIQUE: Multidetector CT imaging of the abdomen and pelvis was performed following the standard protocol without IV contrast. COMPARISON:  11/17/2020 FINDINGS: Lower chest: There is bibasilar airspace disease greatest within the lower lobes, left greater than right. Findings could reflect edema or infection. No effusion or pneumothorax. Unenhanced CT was performed per clinician order. Lack of IV contrast limits sensitivity and specificity, especially for evaluation of abdominal/pelvic solid viscera. Hepatobiliary: No focal liver abnormality is seen. Status post cholecystectomy. No biliary dilatation. Pancreas: Unremarkable. No pancreatic ductal dilatation or surrounding inflammatory changes. Spleen: Normal in size without focal abnormality. Adrenals/Urinary Tract: No urinary tract calculi or obstructive uropathy. The adrenals and bladder are unremarkable. Stomach/Bowel: There is distension of the stomach and proximal small bowel, with numerous proximal small bowel gas fluid levels, consistent with obstruction. There is change in caliber within the distal jejunum within the right lower quadrant. A stable Richter type hernia within the central upper  abdomen is identified, with protrusion of the anti mesenteric aspect of the mid transverse colonic wall through the hernia defect. No wall thickening or fat stranding to suggest incarceration. Vascular/Lymphatic: Aortic atherosclerosis. No enlarged abdominal or pelvic lymph nodes. Reproductive: Continued marked enlargement of the prostate, which measures 5.8 x 6.1 x 6.0 cm. Other: No free fluid or free gas.  Richter type hernia in the central upper abdomen as described above. Musculoskeletal: No acute or destructive bony lesions. Reconstructed images demonstrate no additional findings. IMPRESSION: 1. Small bowel obstruction, with transition in the distal jejunum right lower quadrant. 2. Bibasilar airspace disease, greatest in the left lower lobe. Favor infection over edema. 3. Richter hernia central upper abdomen, with protrusion of the anti mesenteric wall of the mid transverse colon through the hernia defect. No incarceration. 4. Marked enlargement of the prostate. 5.  Aortic Atherosclerosis (ICD10-I70.0). Electronically Signed   By: Randa Ngo M.D.   On: 11/17/2020 03:39   DG Chest 1 View  Result Date: 11/17/2020 CLINICAL DATA:  Nasogastric tube insertion EXAM: CHEST  1 VIEW COMPARISON:  Chest x-ray earlier today FINDINGS: The enteric tube tip is at the stomach with side port near the GE junction. Opacity in the lower lungs as seen on preceding CT. Normal heart size. Presumed thyroidectomy. No visible effusion or pneumothorax IMPRESSION: 1. Enteric tube with tip at the stomach and side port near the GE junction. 2. Bilateral pulmonary infiltrate, question aspiration given the degree of gastric distension on prior. Electronically Signed   By: Monte Fantasia M.D.   On: 11/17/2020 04:33   NM Pulmonary Perfusion  Result Date: 11/17/2020 CLINICAL DATA:  Shortness of breath. EXAM: NUCLEAR MEDICINE PERFUSION LUNG SCAN TECHNIQUE: Perfusion images were obtained in multiple projections after intravenous injection of radiopharmaceutical. Ventilation scans intentionally deferred if perfusion scan and chest x-ray adequate for interpretation during COVID 19 epidemic. RADIOPHARMACEUTICALS:  4.0 mCi Tc-63m MAA IV COMPARISON:  CT abdomen/pelvis 11/17/2020 and chest x-ray 11/17/2020. FINDINGS: There is a large area of decreased perfusion involving the superior segment of the left lower lobe. This corresponds to a large  infiltrate seen best on the prior CT scan. I do not see any findings suspicious for pulmonary embolism. IMPRESSION: Left-sided perfusion defect matching a large infiltrate on recent chest x-ray and CT scan. No findings suspicious for pulmonary embolism. Electronically Signed   By: Marijo Sanes M.D.   On: 11/17/2020 13:10   DG Chest Portable 1 View  Result Date: 11/17/2020 CLINICAL DATA:  Short of breath, rales EXAM: PORTABLE CHEST 1 VIEW COMPARISON:  07/20/2015 FINDINGS: Single frontal view of the chest demonstrates a stable cardiac silhouette. There is prominence of the central pulmonary vasculature, without acute airspace disease, effusion, or pneumothorax. Postsurgical changes are seen from thyroidectomy. No acute bony abnormalities. IMPRESSION: 1. Increased central vascular congestion without overt edema. Electronically Signed   By: Randa Ngo M.D.   On: 11/17/2020 02:21   ECHOCARDIOGRAM COMPLETE  Result Date: 11/17/2020    ECHOCARDIOGRAM REPORT   Patient Name:   Victor Hunter Date of Exam: 11/17/2020 Medical Rec #:  [id number]        Height:       63.0 in Accession #:    [id number]       Weight:       170.6 lb Date of Birth:  Aug 17, 1934        BSA:          1.807 m Patient Age:    87 years  BP:           139/68 mmHg Patient Gender: M                HR:           98 bpm. Exam Location:  [facility] Procedure: 2D Echo Indications:    CHF-Acute Diastolic Y19.50  History:        Patient has no prior history of Echocardiogram examinations.                 Signs/Symptoms:Fever and Syncope; Risk Factors:Former Smoker.                 Acute Respiratory Failure, GERD, Sepsis.  Sonographer:    Leavy Cella RDCS (AE) Referring Phys: [id number] OLADAPO ADEFESO IMPRESSIONS  1. Poor acoustic windows No definite wall motion abnormalities but endocardium is difficult to see. . Left ventricular ejection fraction, by estimation, is 55 to 60%. The left ventricle has normal function. There is mild left  ventricular hypertrophy. Left ventricular diastolic parameters are indeterminate.  2. Right ventricular systolic function is normal. The right ventricular size is normal.  3. The mitral valve is normal in structure. Trivial mitral valve regurgitation.  4. The aortic valve is tricuspid. Aortic valve regurgitation is not visualized. Mild to moderate aortic valve sclerosis/calcification is present, without any evidence of aortic stenosis.  5. The inferior vena cava is normal in size with greater than 50% respiratory variability, suggesting right atrial pressure of 3 mmHg. FINDINGS  Left Ventricle: Poor acoustic windows No definite wall motion abnormalities but endocardium is difficult to see. Left ventricular ejection fraction, by estimation, is 55 to 60%. The left ventricle has normal function. The left ventricular internal cavity size was normal in size. There is mild left ventricular hypertrophy. Left ventricular diastolic parameters are indeterminate. Right Ventricle: The right ventricular size is normal. Right vetricular wall thickness was not assessed. Right ventricular systolic function is normal. Left Atrium: Left atrial size was normal in size. Right Atrium: Right atrial size was normal in size. Pericardium: There is no evidence of pericardial effusion. Mitral Valve: The mitral valve is normal in structure. There is mild thickening of the mitral valve leaflet(s). Mild mitral annular calcification. Trivial mitral valve regurgitation. Tricuspid Valve: The tricuspid valve is grossly normal. Tricuspid valve regurgitation is not demonstrated. Aortic Valve: The aortic valve is tricuspid. Aortic valve regurgitation is not visualized. Mild to moderate aortic valve sclerosis/calcification is present, without any evidence of aortic stenosis. Pulmonic Valve: The pulmonic valve was normal in structure. Pulmonic valve regurgitation is not visualized. Aorta: The aortic root is normal in size and structure. Venous: The  inferior vena cava is normal in size with greater than 50% respiratory variability, suggesting right atrial pressure of 3 mmHg. IAS/Shunts: The interatrial septum was not assessed.  LEFT VENTRICLE PLAX 2D LVIDd:         3.57 cm  Diastology LVIDs:         2.39 cm  LV e' medial:    11.20 cm/s LV PW:         1.06 cm  LV E/e' medial:  11.5 LV IVS:        1.28 cm  LV e' lateral:   16.60 cm/s LVOT diam:     1.80 cm  LV E/e' lateral: 7.8 LVOT Area:     2.54 cm  RIGHT VENTRICLE RV S prime:     11.80 cm/s TAPSE (M-mode): 1.1 cm LEFT ATRIUM  Index       RIGHT ATRIUM          Index LA diam:        2.50 cm 1.38 cm/m  RA Area:     8.84 cm LA Vol (A2C):   41.6 ml 23.02 ml/m RA Volume:   16.30 ml 9.02 ml/m LA Vol (A4C):   30.7 ml 16.99 ml/m LA Biplane Vol: 37.5 ml 20.75 ml/m   AORTA Ao Root diam: 3.40 cm MITRAL VALVE MV Area (PHT): 3.85 cm     SHUNTS MV Decel Time: 197 msec     Systemic Diam: 1.80 cm MV E velocity: 129.00 cm/s Dorris Carnes MD Electronically signed by Dorris Carnes MD Signature Date/Time: 11/17/2020/6:42:44 PM    Final     Anti-infectives: Anti-infectives (From admission, onward)   Start     Dose/Rate Route Frequency Ordered Stop   11/18/20 1000  vancomycin (VANCOREADY) IVPB 1500 mg/300 mL        1,500 mg 150 mL/hr over 120 Minutes Intravenous Every 48 hours 11/17/20 0845     11/17/20 1200  piperacillin-tazobactam (ZOSYN) IVPB 3.375 g        3.375 g 12.5 mL/hr over 240 Minutes Intravenous Every 8 hours 11/17/20 0651     11/17/20 0445  vancomycin (VANCOCIN) IVPB 1000 mg/200 mL premix        1,000 mg 200 mL/hr over 60 Minutes Intravenous  Once 11/17/20 0439 11/17/20 0727   11/17/20 0445  piperacillin-tazobactam (ZOSYN) IVPB 3.375 g        3.375 g 100 mL/hr over 30 Minutes Intravenous  Once 11/17/20 0439 11/17/20 0555       Assessment/Plan Elevated tn - per cards Possible aspiration pna - on abx. Per [facility] Hx HTN Hx of GERD Hypokalemia  Lactic acidosis - resolved  SBO Small  ventral hernia - reducible. No incarceration noted on CT.  - Hx of Lap chole (2003 - Dr. Arnoldo Morale) and Ex- lap for refractory sbo revealed adynamic ileus, (Dr. Elby Showers - 2013) - NPO, NGT to [facility]. Patient has begun passing flatus but still distended and with > 1 L out from NGT. Xray this morning  - I do not see contrast on xray this morning. May need to repeat gastrografin for sbo protocol.  - Mobilize for bowel function - Keep K > 4 and Mg > 2 for bowel function - Hopefully patient will improve with conservative measures. We will following along closely.   FEN - NPO, NGT, IVF per [facility] VTE - SCDs, heparin gtt ID - Vanc/Zosyn for asp pna. WBC 16.6. Lactic 1.4   LOS: 1 day    Jillyn Ledger , [facility] 11/18/2020, 9:14 AM Please see Amion for pager number during day hours 7:00am-4:30pm

## 2020-11-18 NOTE — Progress Notes (Signed)
Pharmacy Antibiotic Note  Victor Hunter is a 87 y.o. male admitted on 11/17/2020 with pneumonia, possible aspiration. Pharmacy has been consulted for Vancomycin dosing. Patient is also on piperacillin/tazobactam.   WBC 15, Afebrile, LA 4.6 > 1.4. ClCr ~27 ml/min. Vancomcyin 1500mg  dose just hung at 11:07am. Asked RN to stop infusion after 66min (= 500mg  vancomycin) given renal function. Orders updated accordingly.   3/17 Vancomycin 500mg  Q24 hr Scr used: 1.9 mg/dL Weight: 85 kg Vd coeff: 0.5 L/kg Est AUC: 495  Plan: Decrease Vancomycin to 500mg  IV q24hr  Zosyn 3.375gm IV q8h EID per MD  Monitor cultures, MRSA swab, clinical status, renal fx, vanc levels  Narrow abx as able and f/u duration   Height: 5\' 3"  (160 cm) Weight: 85 kg (187 lb 6.3 oz) IBW/kg (Calculated) : 56.9  Temp (24hrs), Avg:98.5 F (36.9 C), Min:98.1 F (36.7 C), Max:99 F (37.2 C)  Recent Labs  Lab 11/17/20 0203 11/17/20 0504 11/17/20 0624 11/17/20 0914 11/17/20 1227 11/17/20 2055 11/17/20 2359 11/18/20 0841  WBC 11.2*  --   --   --   --   --  16.6* 15.8*  CREATININE 2.06*  --   --   --   --   --   --  1.91*  LATICACIDVEN  --  5.8* 4.6* 3.8* 2.7* 1.4  --   --     Estimated Creatinine Clearance: 27.7 mL/min (A) (by C-G formula based on SCr of 1.91 mg/dL (H)).    No Known Allergies  Antimicrobials this admission: Vancomycin 3/16 >>  Zosyn 3/16 >>   Dose adjustments this admission: 3/17 Vancomycin 1.5g Q48hr > 500mg  Q24hr   Microbiology results: 3/16 BCx: NGTD  3/16 flu/covid neg 3/17 MRSA PCR: pending    Thank you for allowing pharmacy to be a part of this patient's care.  Benetta Spar, PharmD, BCPS, BCCP Clinical Pharmacist  Please check AMION for all [place] phone numbers After 10:00 PM, call [place] [phone]

## 2020-11-19 ENCOUNTER — Inpatient Hospital Stay (HOSPITAL_COMMUNITY): Payer: Medicare PPO

## 2020-11-19 DIAGNOSIS — J9601 Acute respiratory failure with hypoxia: Secondary | ICD-10-CM | POA: Diagnosis not present

## 2020-11-19 LAB — COMPREHENSIVE METABOLIC PANEL
ALT: 18 U/L (ref 0–44)
AST: 26 U/L (ref 15–41)
Albumin: 2.7 g/dL — ABNORMAL LOW (ref 3.5–5.0)
Alkaline Phosphatase: 77 U/L (ref 38–126)
Anion gap: 13 (ref 5–15)
BUN: 31 mg/dL — ABNORMAL HIGH (ref 8–23)
CO2: 25 mmol/L (ref 22–32)
Calcium: 8.1 mg/dL — ABNORMAL LOW (ref 8.9–10.3)
Chloride: 103 mmol/L (ref 98–111)
Creatinine, Ser: 1.77 mg/dL — ABNORMAL HIGH (ref 0.61–1.24)
GFR, Estimated: 37 mL/min — ABNORMAL LOW (ref >60)
Glucose, Bld: 79 mg/dL (ref 70–99)
Potassium: 3.3 mmol/L — ABNORMAL LOW (ref 3.5–5.1)
Sodium: 141 mmol/L (ref 135–145)
Total Bilirubin: 1.3 mg/dL — ABNORMAL HIGH (ref 0.3–1.2)
Total Protein: 6 g/dL — ABNORMAL LOW (ref 6.5–8.1)

## 2020-11-19 LAB — CBC WITH DIFFERENTIAL/PLATELET
Abs Immature Granulocytes: 0.16 10*3/uL — ABNORMAL HIGH (ref 0.00–0.07)
Basophils Absolute: 0 10*3/uL (ref 0.0–0.1)
Basophils Relative: 0 %
Eosinophils Absolute: 0 10*3/uL (ref 0.0–0.5)
Eosinophils Relative: 0 %
HCT: 34.2 % — ABNORMAL LOW (ref 39.0–52.0)
Hemoglobin: 11.2 g/dL — ABNORMAL LOW (ref 13.0–17.0)
Immature Granulocytes: 1 %
Lymphocytes Relative: 9 %
Lymphs Abs: 1.4 10*3/uL (ref 0.7–4.0)
MCH: 25.9 pg — ABNORMAL LOW (ref 26.0–34.0)
MCHC: 32.7 g/dL (ref 30.0–36.0)
MCV: 79.2 fL — ABNORMAL LOW (ref 80.0–100.0)
Monocytes Absolute: 1.1 10*3/uL — ABNORMAL HIGH (ref 0.1–1.0)
Monocytes Relative: 7 %
Neutro Abs: 13.1 10*3/uL — ABNORMAL HIGH (ref 1.7–7.7)
Neutrophils Relative %: 83 %
Platelets: 187 10*3/uL (ref 150–400)
RBC: 4.32 MIL/uL (ref 4.22–5.81)
RDW: 16 % — ABNORMAL HIGH (ref 11.5–15.5)
WBC: 15.7 10*3/uL — ABNORMAL HIGH (ref 4.0–10.5)
nRBC: 0.1 % (ref 0.0–0.2)

## 2020-11-19 LAB — MAGNESIUM: Magnesium: 1.9 mg/dL (ref 1.7–2.4)

## 2020-11-19 LAB — HEPARIN LEVEL (UNFRACTIONATED): Heparin Unfractionated: 0.19 IU/mL — ABNORMAL LOW (ref 0.30–0.70)

## 2020-11-19 MED ORDER — KCL-LACTATED RINGERS-D5W 20 MEQ/L IV SOLN
INTRAVENOUS | Status: DC
  Filled 2020-11-19 (×5): qty 1000

## 2020-11-19 MED ORDER — ENOXAPARIN SODIUM 30 MG/0.3ML ~~LOC~~ SOLN
30.0000 mg | Freq: Every day | SUBCUTANEOUS | Status: DC
  Administered 2020-11-19 – 2020-11-22 (×4): 30 mg via SUBCUTANEOUS
  Filled 2020-11-19 (×4): qty 0.3

## 2020-11-19 MED ORDER — KCL-LACTATED RINGERS 20 MEQ/L IV SOLN
INTRAVENOUS | Status: DC
  Filled 2020-11-19: qty 1000

## 2020-11-19 MED ORDER — DIATRIZOATE MEGLUMINE & SODIUM 66-10 % PO SOLN
90.0000 mL | Freq: Once | ORAL | Status: AC
  Administered 2020-11-19: 90 mL via NASOGASTRIC
  Filled 2020-11-19: qty 90

## 2020-11-19 NOTE — Progress Notes (Signed)
Pt NG tube move out of place and it was re-inserted.  KUB done; per Dr Georgette Dover, Matt suction to be re-stared at this time.

## 2020-11-19 NOTE — Evaluation (Signed)
Physical Therapy Evaluation Patient Details Name: Victor Hunter MRN: [id number] DOB: [birth year] Today's Date: 11/19/2020   History of Present Illness  Pt is an 87 y/o male admitted 3/15 for acute respiratory failure from PNA and SBO. Pt had NG tube upon presentation. PMH includes SBO, HTN, and CAD.  Clinical Impression  Pt admitted secondary to problem above with deficits below. Pt with bowel urgency and frequency this session so mobility limited. Required min guard A to stand for clean up X2 and to ambulate short distance. Anticipate pt will progress well and be able to d/c home with HHPT. Pt's daughter reports she plans for the pt to stay with her at d/c. Will continue to follow acutely.     Follow Up Recommendations Home health PT    Equipment Recommendations  None recommended by PT    Recommendations for Other Services OT consult     Precautions / Restrictions Precautions Precautions: Fall Precaution Comments: NG tube Restrictions Weight Bearing Restrictions: No      Mobility  Bed Mobility               General bed mobility comments: Sitting on BSC upon entry    Transfers Overall transfer level: Needs assistance Equipment used: None Transfers: Sit to/from Stand Sit to Stand: Min guard         General transfer comment: Min guard for safety. Had to return to sitting on BSC X2-3 secondary to diarrhea.  Ambulation/Gait Ambulation/Gait assistance: Min guard Gait Distance (Feet): 5 Feet Assistive device: None Gait Pattern/deviations: Step-through pattern;Decreased stride length Gait velocity: Decreased   General Gait Details: Slow guarded gait to chair. Very short steps. Min guard for safety. Further mobility limited secondary to bowel frequency and RN wanting to attach pt back to suction for NG tube.  Stairs            Wheelchair Mobility    Modified Rankin (Stroke Patients Only)       Balance Overall balance assessment: Needs  assistance Sitting-balance support: No upper extremity supported Sitting balance-Leahy Scale: Good     Standing balance support: No upper extremity supported;During functional activity Standing balance-Leahy Scale: Fair                               Pertinent Vitals/Pain Pain Assessment: Faces Faces Pain Scale: Hurts little more Pain Location: abdomen Pain Descriptors / Indicators: Aching Pain Intervention(s): Limited activity within patient's tolerance;Monitored during session;Repositioned    Home Living Family/patient expects to be discharged to:: Private residence Living Arrangements: Children Available Help at Discharge: Family;Available 24 hours/day Type of Home: House Home Access: Level entry     Home Layout: Two level Home Equipment: Bedside commode;Walker - 2 wheels Additional Comments: Pt's daughter reports she plans to take the pt home with her.    Prior Function Level of Independence: Independent         Comments: Works as a Development worker, international aid        Extremity/Trunk Assessment   Upper Extremity Assessment Upper Extremity Assessment: Defer to OT evaluation    Lower Extremity Assessment Lower Extremity Assessment: Generalized weakness    Cervical / Trunk Assessment Cervical / Trunk Assessment: Normal  Communication   Communication: HOH  Cognition Arousal/Alertness: Awake/alert Behavior During Therapy: WFL for tasks assessed/performed Overall Cognitive Status: Within Functional Limits for tasks assessed  General Comments General comments (skin integrity, edema, etc.): Pt's daughter present during session    Exercises Other Exercises Other Exercises: Performed IS X10 up to 500   Assessment/Plan    PT Assessment Patient needs continued PT services  PT Problem List Decreased strength;Decreased balance;Decreased activity tolerance;Decreased mobility       PT  Treatment Interventions Gait training;DME instruction;Stair training;Functional mobility training;Therapeutic activities;Therapeutic exercise;Balance training;Patient/family education    PT Goals (Current goals can be found in the Care Plan section)  Acute Rehab PT Goals Patient Stated Goal: to feel better PT Goal Formulation: With patient Time For Goal Achievement: 12/03/20 Potential to Achieve Goals: Good    Frequency Min 3X/week   Barriers to discharge        Co-evaluation               AM-PAC PT "6 Clicks" Mobility  Outcome Measure Help needed turning from your back to your side while in a flat bed without using bedrails?: None Help needed moving from lying on your back to sitting on the side of a flat bed without using bedrails?: A Little Help needed moving to and from a bed to a chair (including a wheelchair)?: A Little Help needed standing up from a chair using your arms (e.g., wheelchair or bedside chair)?: A Little Help needed to walk in hospital room?: A Little Help needed climbing 3-5 steps with a railing? : A Lot 6 Click Score: 18    End of Session   Activity Tolerance: Patient limited by fatigue Patient left: in chair;with call bell/phone within reach;with family/visitor present Nurse Communication: Mobility status PT Visit Diagnosis: Muscle weakness (generalized) (M62.81)    Time: 4970-2637 PT Time Calculation (min) (ACUTE ONLY): 32 min   Charges:   PT Evaluation $PT Eval Moderate Complexity: 1 Mod PT Treatments $Therapeutic Activity: 8-22 mins        [name], DPT  Acute Rehabilitation Services  Pager: [phone] Office: [phone]   [name] 11/19/2020, 1:49 PM

## 2020-11-19 NOTE — Progress Notes (Signed)
PROGRESS NOTE   Victor Hunter  STM:[id number] DOB: 1934/03/26 DOA: 11/17/2020 PCP: Rosita Fire, MD  Brief Narrative:   87 year old black male community dwelling BPH + epididymitis 2016 status post TURP 2008 Mallory-Weiss tear 2005+ readmission 2013 cryptogenic anemia-was supposed to have elective hernia repair?  And was to follow-up with Dr. Buford Dresser in [place] at the time Prior RUE DVT 04/2012 Hypothyroid  Prior SBO 04/22/2012 = adynamic ileus??-Intubated/ventilated-had septic shock at the time  Admit to [facility] 3/16 4 days abdominal distention constipation obstipation, increased WOB O2 sats 78% on arrival of EMS 15 L Hypotensive 100/60 BUNs/creatinine 33/2.0 (baseline 1.4 Troponin peak 140 4200 Lactic acid 5  CT abdomen pelvis = SBO + distal jejunal transition point?  Airspace disease on CT NG tube placed 800 cc coffee-ground/negative Gastroccult Rx heparin plus ASA (troponin) Dr. Constance Haw General surgery recommended transfer-->[facility]--> general surgery evaluated  Apparently been passing flatus Gastrografin X2-General surgery consulted and seen-patient now passing stool Treating for pneumonia and narrowing to Zosyn  Hospital-Problem based course  SBO in the setting of prior adynamic ileus 2013  Defer to gen surgery next steps  Passing copious amounts of stool today   Long discussion with 1 daughter at the bedside  Strict NPO  LR to 75 cc/h, net even Severe sepsis on admission ?  PNA  CXR 3/17 more consistent with atelectasis  DC vancomycin continue Zosyn at this time NSTEMI  Cardiology feels this is demand ischemia and have  verbalized to discontinue heparin  Echocardiogram 3/16 no wall motion abnormalities suspect related to stress of SBO?   Sepsis  Lisinopril 20, HCTZ, Cardizem 180 on hold at this Time  As needed metoprolol IV for blood pressure control Prior right upper extremity DVT 2013  No longer on anticoagulation  May benefit from ASA? Hypothyroid  Replace  Synthroid IV half usual dose 56   DVT prophylaxis: SCD at this time Code Status: Full Family Communication: D/W daughter Victor Hunter at the bedside today 3/18 and answered all questions Disposition:  Status is: Inpatient  Remains inpatient appropriate because:Persistent severe electrolyte disturbances, Altered mental status and IV treatments appropriate due to intensity of illness or inability to take PO   Dispo: The patient is from: Home              Anticipated d/c is to: SNF              Patient currently is not medically stable to d/c.   Difficult to place patient No    Consultants:   General surgery  Cardiology  Procedures: Multiple CTs  Antimicrobials: Vancomycin DC 3/18-continuing Zosyn   Subjective:  Awake alert multiple stools today no chest pain no fever NG tube came out and had to be replaced I personally reviewed the images and have asked the nurse to advance the NG tube   Objective: Vitals:   11/18/20 1555 11/18/20 1950 11/19/20 0300 11/19/20 1452  BP: (!) 163/90 (!) 152/86 (!) 155/84 (!) 158/78  Pulse: 90 91 91 100  Resp: 20 18 18 19   Temp: 98.4 F (36.9 C) 98.2 F (36.8 C) 98.3 F (36.8 C) 98.7 F (37.1 C)  TempSrc: Oral Oral Oral Axillary  SpO2: 96% 97% 96% 96%  Weight:      Height:        Intake/Output Summary (Last 24 hours) at 11/19/2020 1636 Last data filed at 11/19/2020 1244 Gross per 24 hour  Intake 0 ml  Output 1150 ml  Net -1150 ml   [employer]  11/17/20 0156 11/17/20 2230  Weight: 77.4 kg 85 kg    Examination:  Alert coherent NG tube in place Chest clear no added sound no rales no rhonchi Abdomen distended slight tympany but less distended than prior No lower extremity edema ROM intact  Data Reviewed: personally reviewed   Data BUN/creatinine 37/1.9-->31/1.7 Blood culture 3/16 no growth to date Echocardiogram 3/16 = EF 55-60+ mild LVH  CBC    Component Value Date/Time   WBC 15.7 (H) 11/19/2020 0324   RBC 4.32  11/19/2020 0324   HGB 11.2 (L) 11/19/2020 0324   HCT 34.2 (L) 11/19/2020 0324   PLT 187 11/19/2020 0324   MCV 79.2 (L) 11/19/2020 0324   MCH 25.9 (L) 11/19/2020 0324   MCHC 32.7 11/19/2020 0324   RDW 16.0 (H) 11/19/2020 0324   LYMPHSABS 1.4 11/19/2020 0324   MONOABS 1.1 (H) 11/19/2020 0324   EOSABS 0.0 11/19/2020 0324   BASOSABS 0.0 11/19/2020 0324   CMP Latest Ref Rng & Units 11/19/2020 11/18/2020 11/17/2020  Glucose 70 - 99 mg/dL 79 98 259(H)  BUN 8 - 23 mg/dL 31(H) 37(H) 33(H)  Creatinine 0.61 - 1.24 mg/dL 1.77(H) 1.91(H) 2.06(H)  Sodium 135 - 145 mmol/L 141 138 138  Potassium 3.5 - 5.1 mmol/L 3.3(L) 3.6 2.8(L)  Chloride 98 - 111 mmol/L 103 102 98  CO2 22 - 32 mmol/L 25 26 19(L)  Calcium 8.9 - 10.3 mg/dL 8.1(L) 7.9(L) 9.0  Total Protein 6.5 - 8.1 g/dL 6.0(L) 6.1(L) 7.8  Total Bilirubin 0.3 - 1.2 mg/dL 1.3(H) 0.9 0.7  Alkaline Phos 38 - 126 U/L 77 80 116  AST 15 - 41 U/L 26 31 29   ALT 0 - 44 U/L 18 18 27      Radiology Studies: DG Chest 2 View  Result Date: 11/19/2020 CLINICAL DATA:  Pneumonia. EXAM: CHEST - 2 VIEW COMPARISON:  November 17, 2020. FINDINGS: Stable cardiomegaly. No pneumothorax is noted. Small bilateral pleural effusions are noted. Nasogastric tube tip is seen in expected position of gastroesophageal junction. Right lower lobe atelectasis or infiltrate is noted. Bony thorax is unremarkable. IMPRESSION: Small bilateral pleural effusions. Right lower lobe atelectasis or infiltrate is noted. Nasogastric tube tip is seen in expected position of gastroesophageal junction. Electronically Signed   By: Marijo Conception M.D.   On: 11/19/2020 08:37   DG Abd 1 View  Result Date: 11/19/2020 CLINICAL DATA:  NG placement EXAM: ABDOMEN - 1 VIEW COMPARISON:  Earlier same day FINDINGS: Enteric tube passes into the stomach with side port at the gastroesophageal junction. Consider advancement. Residual contrast within the bowel. IMPRESSION: Enteric tube within the proximal stomach with the  side port at the gastroesophageal junction. Consider advancement. Electronically Signed   By: Macy Mis M.D.   On: 11/19/2020 16:07   DG Abd Portable 1V-Small Bowel Obstruction Protocol-24 hr delay  Result Date: 11/19/2020 CLINICAL DATA:  Small-bowel obstruction EXAM: PORTABLE ABDOMEN - 1 VIEW COMPARISON:  11/18/2020 FINDINGS: 2 supine frontal views of the abdomen and pelvis demonstrate enteric catheter tip and side port projecting at the gastroesophageal junction, stable. Distended gas-filled loops of small bowel in the central abdomen are slightly less prominent on this study, measuring up to 3.4 cm in diameter. Gas again seen within the decompressed colon. No masses or abnormal calcifications. IMPRESSION: 1. Persistent gaseous distention of the small bowel, though decreased in prominence since prior study, consistent with improving obstruction. Electronically Signed   By: Randa Ngo M.D.   On: 11/19/2020 03:45   DG  Abd Portable 1V-Small Bowel Obstruction Protocol-initial, 8 hr delay  Result Date: 11/18/2020 CLINICAL DATA:  Small bowel obstruction EXAM: PORTABLE ABDOMEN - 1 VIEW COMPARISON:  11/17/2020 CT FINDINGS: NG tube tip is in the proximal stomach just beyond the GE junction. Side port is in the distal esophagus. Mildly prominent mid abdominal small bowel loops. Findings compatible with small bowel obstruction. Gas seen within the decompressed colon. Prior cholecystectomy. No organomegaly or free air. IMPRESSION: Continued small bowel obstruction pattern. Overall not significantly changed since prior CT. NG tube tip in the proximal stomach near the GE junction. Electronically Signed   By: Rolm Baptise M.D.   On: 11/18/2020 09:15     Scheduled Meds:  enoxaparin (LOVENOX) injection  30 mg Subcutaneous Daily   hydrALAZINE  10 mg Intravenous TID   [START ON 11/20/2020] levothyroxine  56 mcg Intravenous Daily   pantoprazole (PROTONIX) IV  40 mg Intravenous Q24H   Continuous  Infusions:  chlorproMAZINE (THORAZINE) IV 12.5 mg (11/19/20 0916)   dextrose 5% lactated ringers with KCl 20 mEq/L 75 mL/hr at 11/19/20 1026   piperacillin-tazobactam 3.375 g (11/19/20 1317)   vancomycin 500 mg (11/19/20 1134)     LOS: 2 days   Time spent: 20 minutes  Nita Sells, MD [facility] To contact the attending provider between 7A-7P or the covering provider during after hours 7P-7A, please log into the web site [email or web address] and access using universal [place] password for that web site. If you do not have the password, please call the hospital operator.  11/19/2020, 4:36 PM

## 2020-11-19 NOTE — Progress Notes (Addendum)
Subjective: CC: Patient reports he is doing well.  No current abdominal pain but still has distention.  Started having some burping/belching overnight.  Denies nausea.  NG tube 940cc/24 hours.  Continues to pass some flatus.  No BM. Did not get out of bed yesterday.  His daughter Mardene Celeste is at bedside.   Objective: Vital signs in last 24 hours: Temp:  [98.2 F (36.8 C)-98.4 F (36.9 C)] 98.3 F (36.8 C) (03/18 0300) Pulse Rate:  [90-91] 91 (03/18 0300) Resp:  [18-20] 18 (03/18 0300) BP: (152-163)/(84-90) 155/84 (03/18 0300) SpO2:  [96 %-97 %] 96 % (03/18 0300) Last BM Date: 11/15/20 (according to patient)  Intake/Output from previous day: 03/17 0701 - 03/18 0700 In: 499.6 [I.V.:499.6] Out: 1790 [Urine:850; Emesis/NG output:940] Intake/Output this shift: No intake/output data recorded.  PE: Gen:  Alert, NAD, pleasant Card:  Reg Pulm:  Decreased breath sounds at the bases, upper lung fields clear.  Abd: Soft, moderate distension, NT, slightly hypoactive bowel sounds, ventral hernia reducible when patient lied flat. NGT in place with bilious output in cannister.  Psych: A&Ox3 Skin: no rashes noted, warm and dry  Lab Results:  Recent Labs    11/18/20 0841 11/19/20 0324  WBC 15.8* 15.7*  HGB 11.6* 11.2*  HCT 36.0* 34.2*  PLT 185 187   BMET Recent Labs    11/18/20 0841 11/19/20 0324  NA 138 141  K 3.6 3.3*  CL 102 103  CO2 26 25  GLUCOSE 98 79  BUN 37* 31*  CREATININE 1.91* 1.77*  CALCIUM 7.9* 8.1*   PT/INR No results for input(s): LABPROT, INR in the last 72 hours. CMP     Component Value Date/Time   NA 141 11/19/2020 0324   K 3.3 (L) 11/19/2020 0324   CL 103 11/19/2020 0324   CO2 25 11/19/2020 0324   GLUCOSE 79 11/19/2020 0324   BUN 31 (H) 11/19/2020 0324   CREATININE 1.77 (H) 11/19/2020 0324   CALCIUM 8.1 (L) 11/19/2020 0324   PROT 6.0 (L) 11/19/2020 0324   ALBUMIN 2.7 (L) 11/19/2020 0324   AST 26 11/19/2020 0324   ALT 18 11/19/2020 0324    ALKPHOS 77 11/19/2020 0324   BILITOT 1.3 (H) 11/19/2020 0324   GFRNONAA 37 (L) 11/19/2020 0324   GFRAA >60 07/23/2015 0712   Lipase     Component Value Date/Time   LIPASE 23 07/20/2015 1919       Studies/Results: NM Pulmonary Perfusion  Result Date: 11/17/2020 CLINICAL DATA:  Shortness of breath. EXAM: NUCLEAR MEDICINE PERFUSION LUNG SCAN TECHNIQUE: Perfusion images were obtained in multiple projections after intravenous injection of radiopharmaceutical. Ventilation scans intentionally deferred if perfusion scan and chest x-ray adequate for interpretation during COVID 19 epidemic. RADIOPHARMACEUTICALS:  4.0 mCi Tc-74m MAA IV COMPARISON:  CT abdomen/pelvis 11/17/2020 and chest x-ray 11/17/2020. FINDINGS: There is a large area of decreased perfusion involving the superior segment of the left lower lobe. This corresponds to a large infiltrate seen best on the prior CT scan. I do not see any findings suspicious for pulmonary embolism. IMPRESSION: Left-sided perfusion defect matching a large infiltrate on recent chest x-ray and CT scan. No findings suspicious for pulmonary embolism. Electronically Signed   By: Marijo Sanes M.D.   On: 11/17/2020 13:10   DG Abd Portable 1V-Small Bowel Obstruction Protocol-24 hr delay  Result Date: 11/19/2020 CLINICAL DATA:  Small-bowel obstruction EXAM: PORTABLE ABDOMEN - 1 VIEW COMPARISON:  11/18/2020 FINDINGS: 2 supine frontal views of the abdomen  and pelvis demonstrate enteric catheter tip and side port projecting at the gastroesophageal junction, stable. Distended gas-filled loops of small bowel in the central abdomen are slightly less prominent on this study, measuring up to 3.4 cm in diameter. Gas again seen within the decompressed colon. No masses or abnormal calcifications. IMPRESSION: 1. Persistent gaseous distention of the small bowel, though decreased in prominence since prior study, consistent with improving obstruction. Electronically Signed   By:  Randa Ngo M.D.   On: 11/19/2020 03:45   DG Abd Portable 1V-Small Bowel Obstruction Protocol-initial, 8 hr delay  Result Date: 11/18/2020 CLINICAL DATA:  Small bowel obstruction EXAM: PORTABLE ABDOMEN - 1 VIEW COMPARISON:  11/17/2020 CT FINDINGS: NG tube tip is in the proximal stomach just beyond the GE junction. Side port is in the distal esophagus. Mildly prominent mid abdominal small bowel loops. Findings compatible with small bowel obstruction. Gas seen within the decompressed colon. Prior cholecystectomy. No organomegaly or free air. IMPRESSION: Continued small bowel obstruction pattern. Overall not significantly changed since prior CT. NG tube tip in the proximal stomach near the GE junction. Electronically Signed   By: Rolm Baptise M.D.   On: 11/18/2020 09:15   ECHOCARDIOGRAM COMPLETE  Result Date: 11/17/2020    ECHOCARDIOGRAM REPORT   Patient Name:   Victor Hunter Date of Exam: 11/17/2020 Medical Rec #:  [id number]        Height:       63.0 in Accession #:    [id number]       Weight:       170.6 lb Date of Birth:  17-Sep-1933        BSA:          1.807 m Patient Age:    35 years         BP:           139/68 mmHg Patient Gender: M                HR:           98 bpm. Exam Location:  [facility] Procedure: 2D Echo Indications:    CHF-Acute Diastolic Y56.38  History:        Patient has no prior history of Echocardiogram examinations.                 Signs/Symptoms:Fever and Syncope; Risk Factors:Former Smoker.                 Acute Respiratory Failure, GERD, Sepsis.  Sonographer:    Leavy Cella RDCS (AE) Referring Phys: [id number] OLADAPO ADEFESO IMPRESSIONS  1. Poor acoustic windows No definite wall motion abnormalities but endocardium is difficult to see. . Left ventricular ejection fraction, by estimation, is 55 to 60%. The left ventricle has normal function. There is mild left ventricular hypertrophy. Left ventricular diastolic parameters are indeterminate.  2. Right ventricular systolic  function is normal. The right ventricular size is normal.  3. The mitral valve is normal in structure. Trivial mitral valve regurgitation.  4. The aortic valve is tricuspid. Aortic valve regurgitation is not visualized. Mild to moderate aortic valve sclerosis/calcification is present, without any evidence of aortic stenosis.  5. The inferior vena cava is normal in size with greater than 50% respiratory variability, suggesting right atrial pressure of 3 mmHg. FINDINGS  Left Ventricle: Poor acoustic windows No definite wall motion abnormalities but endocardium is difficult to see. Left ventricular ejection fraction, by estimation, is 55 to 60%. The left ventricle has normal  function. The left ventricular internal cavity size was normal in size. There is mild left ventricular hypertrophy. Left ventricular diastolic parameters are indeterminate. Right Ventricle: The right ventricular size is normal. Right vetricular wall thickness was not assessed. Right ventricular systolic function is normal. Left Atrium: Left atrial size was normal in size. Right Atrium: Right atrial size was normal in size. Pericardium: There is no evidence of pericardial effusion. Mitral Valve: The mitral valve is normal in structure. There is mild thickening of the mitral valve leaflet(s). Mild mitral annular calcification. Trivial mitral valve regurgitation. Tricuspid Valve: The tricuspid valve is grossly normal. Tricuspid valve regurgitation is not demonstrated. Aortic Valve: The aortic valve is tricuspid. Aortic valve regurgitation is not visualized. Mild to moderate aortic valve sclerosis/calcification is present, without any evidence of aortic stenosis. Pulmonic Valve: The pulmonic valve was normal in structure. Pulmonic valve regurgitation is not visualized. Aorta: The aortic root is normal in size and structure. Venous: The inferior vena cava is normal in size with greater than 50% respiratory variability, suggesting right atrial pressure  of 3 mmHg. IAS/Shunts: The interatrial septum was not assessed.  LEFT VENTRICLE PLAX 2D LVIDd:         3.57 cm  Diastology LVIDs:         2.39 cm  LV e' medial:    11.20 cm/s LV PW:         1.06 cm  LV E/e' medial:  11.5 LV IVS:        1.28 cm  LV e' lateral:   16.60 cm/s LVOT diam:     1.80 cm  LV E/e' lateral: 7.8 LVOT Area:     2.54 cm  RIGHT VENTRICLE RV S prime:     11.80 cm/s TAPSE (M-mode): 1.1 cm LEFT ATRIUM             Index       RIGHT ATRIUM          Index LA diam:        2.50 cm 1.38 cm/m  RA Area:     8.84 cm LA Vol (A2C):   41.6 ml 23.02 ml/m RA Volume:   16.30 ml 9.02 ml/m LA Vol (A4C):   30.7 ml 16.99 ml/m LA Biplane Vol: 37.5 ml 20.75 ml/m   AORTA Ao Root diam: 3.40 cm MITRAL VALVE MV Area (PHT): 3.85 cm     SHUNTS MV Decel Time: 197 msec     Systemic Diam: 1.80 cm MV E velocity: 129.00 cm/s Dorris Carnes MD Electronically signed by Dorris Carnes MD Signature Date/Time: 11/17/2020/6:42:44 PM    Final     Anti-infectives: Anti-infectives (From admission, onward)   Start     Dose/Rate Route Frequency Ordered Stop   11/19/20 1100  vancomycin (VANCOREADY) IVPB 500 mg/100 mL        500 mg 100 mL/hr over 60 Minutes Intravenous Every 24 hours 11/18/20 1120     11/18/20 1000  vancomycin (VANCOREADY) IVPB 1500 mg/300 mL        1,500 mg 150 mL/hr over 120 Minutes Intravenous Every 48 hours 11/17/20 0845 11/18/20 1145   11/17/20 1200  piperacillin-tazobactam (ZOSYN) IVPB 3.375 g        3.375 g 12.5 mL/hr over 240 Minutes Intravenous Every 8 hours 11/17/20 0651     11/17/20 0445  vancomycin (VANCOCIN) IVPB 1000 mg/200 mL premix        1,000 mg 200 mL/hr over 60 Minutes Intravenous  Once 11/17/20 0439 11/17/20 0727  11/17/20 0445  piperacillin-tazobactam (ZOSYN) IVPB 3.375 g        3.375 g 100 mL/hr over 30 Minutes Intravenous  Once 11/17/20 0439 11/17/20 0555       Assessment/Plan Elevated tn - per cards, felt to be demand ischemia  Possible aspiration pna - on abx. Per [facility] Hx  HTN Hx of GERD Hypokalemia  Lactic acidosis - resolved  SBO Small ventral hernia - reducible. No incarceration noted on CT.  - Hx of Lap chole (2003 - Dr. Arnoldo Morale) and Ex- lap for refractory sbo revealed adynamic ileus, (Dr. Elby Showers - 2013) - NPO, NGT to [facility] Patient has begun passing flatus but still distended and with bilious outpt from NGT. Xray this morning with improved small bowel gas pattern but persisting gaseous distention of the small bowel.  Patient appears to be improving but not ready to pull NG tube.  Will repeat SBO protocol given prior gastrografin appeared to be sucked back out - Mobilize for bowel function (did not get out of bed yesterday, will order PT) - Keep K > 4 and Mg > 2 for bowel function - Hopefully patient will improve with conservative measures. We will following along closely.   FEN - NPO, NGT, IVF per [facility] VTE - SCDs, heparin gtt ID - Vanc/Zosyn for asp pna. WBC 15.7   LOS: 2 days    Jillyn Ledger , [facility] Surgery 11/19/2020, 8:36 AM Please see Amion for pager number during day hours 7:00am-4:30pm

## 2020-11-19 NOTE — Progress Notes (Signed)
ANTICOAGULATION CONSULT NOTE  Pharmacy Consult for Heparin Indication: chest pain/ACS  No Known Allergies  Patient Measurements: Height: 5\' 3"  (160 cm) Weight: 85 kg (187 lb 6.3 oz) IBW/kg (Calculated) : 56.9 Heparin Dosing Weight: 70 kg  Vital Signs: Temp: 98.3 F (36.8 C) (03/18 0300) Temp Source: Oral (03/18 0300) BP: 155/84 (03/18 0300) Pulse Rate: 91 (03/18 0300)  Labs: Recent Labs    11/17/20 0203 11/17/20 0330 11/17/20 0752 11/17/20 0914 11/17/20 1226 11/17/20 2055 11/17/20 2359 11/18/20 0046 11/18/20 0841 11/19/20 0324  HGB 14.1  --   --   --   --   --  12.5*  --  11.6* 11.2*  HCT 46.4  --   --   --   --   --  38.0*  --  36.0* 34.2*  PLT 260  --   --   --   --   --  190  --  185 187  HEPARINUNFRC  --   --   --   --    < >  --  0.36 0.35  --  0.19*  CREATININE 2.06*  --   --   --   --   --   --   --  1.91* 1.77*  TROPONINIHS 144*   < > 373* 344*  --  157*  --   --   --   --    < > = values in this interval not displayed.    Estimated Creatinine Clearance: 29.9 mL/min (A) (by C-G formula based on SCr of 1.77 mg/dL (H)).     Assessment: 87 y.o. male with eleavated troponin, possible ACS, for heparin.  Heparin level down to subtherapeutic (0.19) on gtt at 850 units/hr. No issues with line or bleeding reported per RN.  Goal of Therapy:  Heparin level 0.3-0.7 units/ml Monitor platelets by anticoagulation protocol: Yes   Plan:  Increase heparin infusion to 1050 units/hr F/u plan for heparin - doesn't appear cards is going to pursue further work-up  Sherlon Handing, PharmD, BCPS Please see amion for complete clinical pharmacist phone list 11/19/2020,4:41 AM

## 2020-11-20 ENCOUNTER — Inpatient Hospital Stay (HOSPITAL_COMMUNITY): Payer: Medicare PPO

## 2020-11-20 DIAGNOSIS — R7989 Other specified abnormal findings of blood chemistry: Secondary | ICD-10-CM | POA: Diagnosis not present

## 2020-11-20 DIAGNOSIS — J690 Pneumonitis due to inhalation of food and vomit: Secondary | ICD-10-CM | POA: Diagnosis not present

## 2020-11-20 DIAGNOSIS — N179 Acute kidney failure, unspecified: Secondary | ICD-10-CM | POA: Diagnosis not present

## 2020-11-20 DIAGNOSIS — J9601 Acute respiratory failure with hypoxia: Secondary | ICD-10-CM | POA: Diagnosis not present

## 2020-11-20 LAB — COMPREHENSIVE METABOLIC PANEL
ALT: 16 U/L (ref 0–44)
AST: 23 U/L (ref 15–41)
Albumin: 2.6 g/dL — ABNORMAL LOW (ref 3.5–5.0)
Alkaline Phosphatase: 73 U/L (ref 38–126)
Anion gap: 8 (ref 5–15)
BUN: 28 mg/dL — ABNORMAL HIGH (ref 8–23)
CO2: 28 mmol/L (ref 22–32)
Calcium: 7.8 mg/dL — ABNORMAL LOW (ref 8.9–10.3)
Chloride: 105 mmol/L (ref 98–111)
Creatinine, Ser: 1.58 mg/dL — ABNORMAL HIGH (ref 0.61–1.24)
GFR, Estimated: 43 mL/min — ABNORMAL LOW (ref >60)
Glucose, Bld: 168 mg/dL — ABNORMAL HIGH (ref 70–99)
Potassium: 3.3 mmol/L — ABNORMAL LOW (ref 3.5–5.1)
Sodium: 141 mmol/L (ref 135–145)
Total Bilirubin: 1.2 mg/dL (ref 0.3–1.2)
Total Protein: 5.7 g/dL — ABNORMAL LOW (ref 6.5–8.1)

## 2020-11-20 LAB — MAGNESIUM: Magnesium: 2.3 mg/dL (ref 1.7–2.4)

## 2020-11-20 MED ORDER — POTASSIUM CHLORIDE 10 MEQ/100ML IV SOLN
10.0000 meq | INTRAVENOUS | Status: AC
  Administered 2020-11-20 (×2): 10 meq via INTRAVENOUS
  Filled 2020-11-20 (×2): qty 100

## 2020-11-20 MED ORDER — PIPERACILLIN-TAZOBACTAM 3.375 G IVPB
3.3750 g | Freq: Three times a day (TID) | INTRAVENOUS | Status: DC
  Administered 2020-11-20 – 2020-11-21 (×3): 3.375 g via INTRAVENOUS
  Filled 2020-11-20 (×3): qty 50

## 2020-11-20 MED ORDER — HYDRALAZINE HCL 20 MG/ML IJ SOLN
20.0000 mg | Freq: Three times a day (TID) | INTRAMUSCULAR | Status: DC
  Administered 2020-11-20 – 2020-11-24 (×12): 20 mg via INTRAVENOUS
  Filled 2020-11-20 (×13): qty 1

## 2020-11-20 MED ORDER — POTASSIUM CHLORIDE 10 MEQ/100ML IV SOLN
10.0000 meq | INTRAVENOUS | Status: DC
  Administered 2020-11-20: 10 meq via INTRAVENOUS

## 2020-11-20 MED ORDER — POTASSIUM CHLORIDE 10 MEQ/100ML IV SOLN
10.0000 meq | INTRAVENOUS | Status: AC
Start: 2020-11-20 — End: 2020-11-20
  Administered 2020-11-20: 10 meq via INTRAVENOUS
  Filled 2020-11-20 (×2): qty 100

## 2020-11-20 NOTE — Progress Notes (Signed)
Subjective: CC: Patient reports abdominal pain has resolved.  Denies any nausea.  NG tube with 1250 last 24 hours.  Reports he is passing flatus.  2 episodes of watery stool yesterday.  Objective: Vital signs in last 24 hours: Temp:  [98.7 F (37.1 C)-99.3 F (37.4 C)] 99.3 F (37.4 C) (03/19 0419) Pulse Rate:  [99-102] 99 (03/19 0419) Resp:  [19-20] 20 (03/19 0419) BP: (155-158)/(70-78) 155/76 (03/19 0419) SpO2:  [94 %-98 %] 94 % (03/19 0419) Last BM Date: 11/19/20  Intake/Output from previous day: 03/18 0701 - 03/19 0700 In: 1501.7 [P.O.:125; I.V.:1343.9; IV Piggyback:32.8] Out: 1900 [Urine:650; Emesis/NG output:1250] Intake/Output this shift: No intake/output data recorded.  PE: Gen: Alert, NAD, pleasant Card: Reg Pulm:Normal rate and effort, on RA Abd: Soft,mild - moderate distension, NT, + bowel sounds, ventral hernia reducible when patient lied flat. NGT in place with bilious output in cannister. Psych: A&Ox3 Skin: no rashes noted, warm and dry  Lab Results:  Recent Labs    11/18/20 0841 11/19/20 0324  WBC 15.8* 15.7*  HGB 11.6* 11.2*  HCT 36.0* 34.2*  PLT 185 187   BMET Recent Labs    11/19/20 0324 11/20/20 0043  NA 141 141  K 3.3* 3.3*  CL 103 105  CO2 25 28  GLUCOSE 79 168*  BUN 31* 28*  CREATININE 1.77* 1.58*  CALCIUM 8.1* 7.8*   PT/INR No results for input(s): LABPROT, INR in the last 72 hours. CMP     Component Value Date/Time   NA 141 11/20/2020 0043   K 3.3 (L) 11/20/2020 0043   CL 105 11/20/2020 0043   CO2 28 11/20/2020 0043   GLUCOSE 168 (H) 11/20/2020 0043   BUN 28 (H) 11/20/2020 0043   CREATININE 1.58 (H) 11/20/2020 0043   CALCIUM 7.8 (L) 11/20/2020 0043   PROT 5.7 (L) 11/20/2020 0043   ALBUMIN 2.6 (L) 11/20/2020 0043   AST 23 11/20/2020 0043   ALT 16 11/20/2020 0043   ALKPHOS 73 11/20/2020 0043   BILITOT 1.2 11/20/2020 0043   GFRNONAA 43 (L) 11/20/2020 0043   GFRAA >60 07/23/2015 0712   Lipase      Component Value Date/Time   LIPASE 23 07/20/2015 1919       Studies/Results: DG Chest 2 View  Result Date: 11/19/2020 CLINICAL DATA:  Pneumonia. EXAM: CHEST - 2 VIEW COMPARISON:  November 17, 2020. FINDINGS: Stable cardiomegaly. No pneumothorax is noted. Small bilateral pleural effusions are noted. Nasogastric tube tip is seen in expected position of gastroesophageal junction. Right lower lobe atelectasis or infiltrate is noted. Bony thorax is unremarkable. IMPRESSION: Small bilateral pleural effusions. Right lower lobe atelectasis or infiltrate is noted. Nasogastric tube tip is seen in expected position of gastroesophageal junction. Electronically Signed   By: Marijo Conception M.D.   On: 11/19/2020 08:37   DG Abd 1 View  Result Date: 11/19/2020 CLINICAL DATA:  G-tube placement EXAM: ABDOMEN - 1 VIEW COMPARISON:  11/19/2020 FINDINGS: NG tube has been advanced. The tip is in the stomach with the side port likely near the GE junction. IMPRESSION: NG tube advanced into the stomach with the side port near the GE junction. Electronically Signed   By: Rolm Baptise M.D.   On: 11/19/2020 17:58   DG Abd 1 View  Result Date: 11/19/2020 CLINICAL DATA:  NG placement EXAM: ABDOMEN - 1 VIEW COMPARISON:  Earlier same day FINDINGS: Enteric tube passes into the stomach with side port at the gastroesophageal junction. Consider  advancement. Residual contrast within the bowel. IMPRESSION: Enteric tube within the proximal stomach with the side port at the gastroesophageal junction. Consider advancement. Electronically Signed   By: Macy Mis M.D.   On: 11/19/2020 16:07   DG Abd Portable 1V-Small Bowel Obstruction Protocol-initial, 8 hr delay  Result Date: 11/19/2020 CLINICAL DATA:  Small bowel obstruction EXAM: PORTABLE ABDOMEN - 1 VIEW COMPARISON:  11/19/2020 FINDINGS: NG tube is in the stomach. Gas and contrast seen throughout the colon. Improved small bowel dilatation compatible with improving obstruction.  IMPRESSION: NG tube in the proximal stomach. Improving small bowel obstruction pattern. Electronically Signed   By: Rolm Baptise M.D.   On: 11/19/2020 21:29   DG Abd Portable 1V-Small Bowel Obstruction Protocol-24 hr delay  Result Date: 11/19/2020 CLINICAL DATA:  Small-bowel obstruction EXAM: PORTABLE ABDOMEN - 1 VIEW COMPARISON:  11/18/2020 FINDINGS: 2 supine frontal views of the abdomen and pelvis demonstrate enteric catheter tip and side port projecting at the gastroesophageal junction, stable. Distended gas-filled loops of small bowel in the central abdomen are slightly less prominent on this study, measuring up to 3.4 cm in diameter. Gas again seen within the decompressed colon. No masses or abnormal calcifications. IMPRESSION: 1. Persistent gaseous distention of the small bowel, though decreased in prominence since prior study, consistent with improving obstruction. Electronically Signed   By: Randa Ngo M.D.   On: 11/19/2020 03:45   DG Abd Portable 1V-Small Bowel Obstruction Protocol-initial, 8 hr delay  Result Date: 11/18/2020 CLINICAL DATA:  Small bowel obstruction EXAM: PORTABLE ABDOMEN - 1 VIEW COMPARISON:  11/17/2020 CT FINDINGS: NG tube tip is in the proximal stomach just beyond the GE junction. Side port is in the distal esophagus. Mildly prominent mid abdominal small bowel loops. Findings compatible with small bowel obstruction. Gas seen within the decompressed colon. Prior cholecystectomy. No organomegaly or free air. IMPRESSION: Continued small bowel obstruction pattern. Overall not significantly changed since prior CT. NG tube tip in the proximal stomach near the GE junction. Electronically Signed   By: Rolm Baptise M.D.   On: 11/18/2020 09:15    Anti-infectives: Anti-infectives (From admission, onward)   Start     Dose/Rate Route Frequency Ordered Stop   11/19/20 1100  vancomycin (VANCOREADY) IVPB 500 mg/100 mL  Status:  Discontinued        500 mg 100 mL/hr over 60 Minutes  Intravenous Every 24 hours 11/18/20 1120 11/19/20 1755   11/18/20 1000  vancomycin (VANCOREADY) IVPB 1500 mg/300 mL        1,500 mg 150 mL/hr over 120 Minutes Intravenous Every 48 hours 11/17/20 0845 11/18/20 1145   11/17/20 1200  piperacillin-tazobactam (ZOSYN) IVPB 3.375 g        3.375 g 12.5 mL/hr over 240 Minutes Intravenous Every 8 hours 11/17/20 0651     11/17/20 0445  vancomycin (VANCOCIN) IVPB 1000 mg/200 mL premix        1,000 mg 200 mL/hr over 60 Minutes Intravenous  Once 11/17/20 0439 11/17/20 0727   11/17/20 0445  piperacillin-tazobactam (ZOSYN) IVPB 3.375 g        3.375 g 100 mL/hr over 30 Minutes Intravenous  Once 11/17/20 0439 11/17/20 0555       Assessment/Plan Elevated tn - per cards, felt to be demand ischemia  Possible aspiration pna - on abx. Per [facility] Hx HTN Hx of GERD Hypokalemia  Lactic acidosis - resolved  SBO Small ventral hernia-reducible. No incarceration noted on CT.  - Hx ofLap chole(2003 - Dr. Arnoldo Morale) andEx-lap for  refractory sbo revealed adynamic ileus,(Dr. Zeigler-2013) - Xray with contrast in the colon this morning with improved small bowel gas dilatation. He has begun passing flatus and had 2 episodes of loose stool. He is NT on exam. NGT still with > 1L however. NGT does not appear to be in the duodenum on x-ray.  Will try clamping trials today - Mobilize for bowel function - PT rec HH - Keep K > 4 and Mg > 2 for bowel function - Hopefully patient will improve with conservative measures. We will following along closely.  FEN -NPO, clamp NGT, IVF per [facility] VTE -SCDs, lovenox ID -Zosyn for asp pna.    LOS: 3 days    Jillyn Ledger , [facility] Surgery 11/20/2020, 8:15 AM Please see Amion for pager number during day hours 7:00am-4:30pm

## 2020-11-20 NOTE — Progress Notes (Signed)
PROGRESS NOTE   Victor Hunter  NTI:[id number] DOB: Nov 30, 1933 DOA: 11/17/2020 PCP: Rosita Fire, MD  Brief Narrative:   87 year old black male community dwelling BPH + epididymitis 2016 status post TURP 2008 Mallory-Weiss tear 2005+ readmission 2013 cryptogenic anemia-was supposed to have elective hernia repair?  And was to follow-up with Dr. Buford Dresser in [place] at the time Prior RUE DVT 04/2012 Hypothyroid  Prior SBO 04/22/2012 = adynamic ileus??-Intubated/ventilated-had septic shock at the time  Admit to [facility] 3/16 4 days abdominal distention constipation obstipation, increased WOB O2 sats 78% on arrival of EMS 15 L Hypotensive 100/60 BUNs/creatinine 33/2.0 (baseline 1.4 Troponin peak 140 4200 Lactic acid 5  CT abdomen pelvis = SBO + distal jejunal transition point?  Airspace disease on CT NG tube placed 800 cc coffee-ground/negative Gastroccult Rx heparin plus ASA (2/2 ? troponin) Dr. Constance Haw General surgery recommended transfer-->[facility]--> general surgery evaluated  Apparently been passing flatus Gastrografin X2-General surgery consulted and seen-patient now passing stool Treating for pneumonia and narrowing to Zosyn     Hospital-Problem based course  SBO in the setting of prior adynamic ileus 2013  Defer to gen surgery next steps-hopeful to avoid any surgical  intervention-contrast seen in the colon at my personal review of images  Fluids transitioned to LR plus K Severe sepsis on admission ?  PNA  CXR 3/17 more consistent with atelectasis  DC vancomycin   continue Zosyn-stop date 3/19 placed NSTEMI  Cardiology d/c on 3/19 heparin  Echocardiogram 3/16 no wall motion abnormalities suspect related to stress of SBO?  Sepsis  Lisinopril 20, HCTZ, Cardizem 180 on hold at this Time  As needed metoprolol IV for blood pressure control Prior right upper extremity DVT 2013  No longer on anticoagulation  May benefit from ASA  Discussion as outpatient Hypothyroid  Replace  Synthroid IV half usual dose 56 Hypokalemia  Replace with K runs + fluids, am Mag   DVT prophylaxis: SCD at this time Code Status: Full Family Communication: Discussed with daughter at the bedside in detail planning Disposition:  Status is: Inpatient  Remains inpatient appropriate because:Persistent severe electrolyte disturbances, Altered mental status and IV treatments appropriate due to intensity of illness or inability to take PO   Dispo: The patient is from: Home              Anticipated d/c is to: SNF              Patient currently is not medically stable to d/c.   Difficult to place patient No    Consultants:   General surgery  Cardiology  Procedures: Multiple CTs  Antimicrobials: Vancomycin DC 3/18-continuing Zosyn   Subjective: Describes intermittent abdominal pain and sort of a choking sensation in his chest I saw him about 45 degrees His NG tube is clamped and seems to be at 60 cm Overall he looks about the same as he did yesterday but little bit more alert    Objective: Vitals:   11/19/20 0300 11/19/20 1452 11/19/20 2054 11/20/20 0419  BP: (!) 155/84 (!) 158/78 (!) 158/70 (!) 155/76  Pulse: 91 100 (!) 102 99  Resp: 18 19 19 20   Temp: 98.3 F (36.8 C) 98.7 F (37.1 C) 98.7 F (37.1 C) 99.3 F (37.4 C)  TempSrc: Oral Axillary Oral Oral  SpO2: 96% 96% 98% 94%  Weight:      Height:        Intake/Output Summary (Last 24 hours) at 11/20/2020 1010 Last data filed at 11/20/2020 0849 Gross per  24 hour  Intake 1501.71 ml  Output 1650 ml  Net -148.29 ml   Filed Weights   11/17/20 0156 11/17/20 2230  Weight: 77.4 kg 85 kg    Examination: Awake coherent no distress EOMI NCAT NG in place Chest clear no added sound Abdomen distended no rebound no guarding Neurologically intact no focal deficit ROM intact Psych euthymic   Data Reviewed: personally reviewed   Data BUN/creatinine 37/1.9-->31/1.7-->-->28/1.5 Potassium 3.3 magnesium 2.3 Blood  culture 3/16 no growth to date Echocardiogram 3/16 = EF 55-60+ mild LVH I/o -388  CBC    Component Value Date/Time   WBC 15.7 (H) 11/19/2020 0324   RBC 4.32 11/19/2020 0324   HGB 11.2 (L) 11/19/2020 0324   HCT 34.2 (L) 11/19/2020 0324   PLT 187 11/19/2020 0324   MCV 79.2 (L) 11/19/2020 0324   MCH 25.9 (L) 11/19/2020 0324   MCHC 32.7 11/19/2020 0324   RDW 16.0 (H) 11/19/2020 0324   LYMPHSABS 1.4 11/19/2020 0324   MONOABS 1.1 (H) 11/19/2020 0324   EOSABS 0.0 11/19/2020 0324   BASOSABS 0.0 11/19/2020 0324   CMP Latest Ref Rng & Units 11/20/2020 11/19/2020 11/18/2020  Glucose 70 - 99 mg/dL 168(H) 79 98  BUN 8 - 23 mg/dL 28(H) 31(H) 37(H)  Creatinine 0.61 - 1.24 mg/dL 1.58(H) 1.77(H) 1.91(H)  Sodium 135 - 145 mmol/L 141 141 138  Potassium 3.5 - 5.1 mmol/L 3.3(L) 3.3(L) 3.6  Chloride 98 - 111 mmol/L 105 103 102  CO2 22 - 32 mmol/L 28 25 26   Calcium 8.9 - 10.3 mg/dL 7.8(L) 8.1(L) 7.9(L)  Total Protein 6.5 - 8.1 g/dL 5.7(L) 6.0(L) 6.1(L)  Total Bilirubin 0.3 - 1.2 mg/dL 1.2 1.3(H) 0.9  Alkaline Phos 38 - 126 U/L 73 77 80  AST 15 - 41 U/L 23 26 31   ALT 0 - 44 U/L 16 18 18      Radiology Studies: DG Chest 2 View  Result Date: 11/19/2020 CLINICAL DATA:  Pneumonia. EXAM: CHEST - 2 VIEW COMPARISON:  November 17, 2020. FINDINGS: Stable cardiomegaly. No pneumothorax is noted. Small bilateral pleural effusions are noted. Nasogastric tube tip is seen in expected position of gastroesophageal junction. Right lower lobe atelectasis or infiltrate is noted. Bony thorax is unremarkable. IMPRESSION: Small bilateral pleural effusions. Right lower lobe atelectasis or infiltrate is noted. Nasogastric tube tip is seen in expected position of gastroesophageal junction. Electronically Signed   By: Marijo Conception M.D.   On: 11/19/2020 08:37   DG Abd 1 View  Result Date: 11/19/2020 CLINICAL DATA:  G-tube placement EXAM: ABDOMEN - 1 VIEW COMPARISON:  11/19/2020 FINDINGS: NG tube has been advanced. The tip is  in the stomach with the side port likely near the GE junction. IMPRESSION: NG tube advanced into the stomach with the side port near the GE junction. Electronically Signed   By: Rolm Baptise M.D.   On: 11/19/2020 17:58   DG Abd 1 View  Result Date: 11/19/2020 CLINICAL DATA:  NG placement EXAM: ABDOMEN - 1 VIEW COMPARISON:  Earlier same day FINDINGS: Enteric tube passes into the stomach with side port at the gastroesophageal junction. Consider advancement. Residual contrast within the bowel. IMPRESSION: Enteric tube within the proximal stomach with the side port at the gastroesophageal junction. Consider advancement. Electronically Signed   By: Macy Mis M.D.   On: 11/19/2020 16:07   DG Abd Portable 1V-Small Bowel Obstruction Protocol-initial, 8 hr delay  Result Date: 11/19/2020 CLINICAL DATA:  Small bowel obstruction EXAM: PORTABLE ABDOMEN - 1  VIEW COMPARISON:  11/19/2020 FINDINGS: NG tube is in the stomach. Gas and contrast seen throughout the colon. Improved small bowel dilatation compatible with improving obstruction. IMPRESSION: NG tube in the proximal stomach. Improving small bowel obstruction pattern. Electronically Signed   By: Rolm Baptise M.D.   On: 11/19/2020 21:29   DG Abd Portable 1V-Small Bowel Obstruction Protocol-24 hr delay  Result Date: 11/19/2020 CLINICAL DATA:  Small-bowel obstruction EXAM: PORTABLE ABDOMEN - 1 VIEW COMPARISON:  11/18/2020 FINDINGS: 2 supine frontal views of the abdomen and pelvis demonstrate enteric catheter tip and side port projecting at the gastroesophageal junction, stable. Distended gas-filled loops of small bowel in the central abdomen are slightly less prominent on this study, measuring up to 3.4 cm in diameter. Gas again seen within the decompressed colon. No masses or abnormal calcifications. IMPRESSION: 1. Persistent gaseous distention of the small bowel, though decreased in prominence since prior study, consistent with improving obstruction.  Electronically Signed   By: Randa Ngo M.D.   On: 11/19/2020 03:45     Scheduled Meds: . enoxaparin (LOVENOX) injection  30 mg Subcutaneous Daily  . hydrALAZINE  10 mg Intravenous TID  . levothyroxine  56 mcg Intravenous Daily  . pantoprazole (PROTONIX) IV  40 mg Intravenous Q24H   Continuous Infusions: . chlorproMAZINE (THORAZINE) IV 12.5 mg (11/19/20 1806)  . dextrose 5% lactated ringers with KCl 20 mEq/L 75 mL/hr at 11/20/20 0021  . piperacillin-tazobactam 3.375 g (11/20/20 0417)  . potassium chloride       LOS: 3 days   Time spent: 20 minutes  Nita Sells, MD [facility] To contact the attending provider between 7A-7P or the covering provider during after hours 7P-7A, please log into the web site [email or web address] and access using universal [place] password for that web site. If you do not have the password, please call the hospital operator.  11/20/2020, 10:10 AM

## 2020-11-20 NOTE — Evaluation (Signed)
Occupational Therapy Evaluation Patient Details Name: Victor Hunter MRN: [id number] DOB: 1933-11-02 Today's Date: 11/20/2020    History of Present Illness Pt is an 87 y/o male admitted 3/15 for acute respiratory failure from PNA and SBO. Pt had NG tube upon presentation. PMH includes SBO, HTN, and CAD.   Clinical Impression   Patient admitted for the above diagnosis.  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  Deficits impacting independence are listed below.  Currently he is needing up to Min A for basic mobility without an AD, and up to Mod A for ADL from sit/stand level.  HH OT can be considered if needed, but acute OT will follow to maximize his functional status.      Follow Up Recommendations  Home health OT    Equipment Recommendations  3 in 1 bedside commode;Tub/shower seat    Recommendations for Other Services       Precautions / Restrictions Precautions Precautions: Fall Precaution Comments: NG tube Restrictions Weight Bearing Restrictions: No      Mobility Bed Mobility Overal bed mobility: Needs Assistance Bed Mobility: Supine to Sit     Supine to sit: Min assist          Transfers Overall transfer level: Needs assistance   Transfers: Sit to/from Stand;Stand Pivot Transfers Sit to Stand: Min guard Stand pivot transfers: Min guard            Balance   Sitting-balance support: Feet supported Sitting balance-Leahy Scale: Good     Standing balance support: No upper extremity supported;During functional activity Standing balance-Leahy Scale: Fair                             ADL either performed or assessed with clinical judgement   ADL Overall ADL's : Needs assistance/impaired Eating/Feeding: NPO   Grooming: Wash/dry hands;Wash/dry face;Sitting;Supervision/safety   Upper Body Bathing: Supervision/ safety;Sitting   Lower Body Bathing: Minimal assistance;Sit  to/from stand   Upper Body Dressing : Minimal assistance;Sitting   Lower Body Dressing: Minimal assistance;Moderate assistance;Sit to/from stand   Toilet Transfer: Designer, fashion/clothing and Hygiene: Moderate assistance       Functional mobility during ADLs: Min guard       Vision Patient Visual Report: No change from baseline                  Pertinent Vitals/Pain Faces Pain Scale: Hurts a little bit Pain Location: abdomen Pain Descriptors / Indicators: Aching Pain Intervention(s): Monitored during session     Hand Dominance Right   Extremity/Trunk Assessment Upper Extremity Assessment Upper Extremity Assessment: Overall WFL for tasks assessed   Lower Extremity Assessment Lower Extremity Assessment: Defer to PT evaluation   Cervical / Trunk Assessment Cervical / Trunk Assessment: Normal   Communication Communication Communication: HOH   Cognition Arousal/Alertness: Awake/alert Behavior During Therapy: WFL for tasks assessed/performed Overall Cognitive Status: Within Functional Limits for tasks assessed                                     General Comments   VSS on RA    Exercises     Shoulder Instructions      Home Living Family/patient expects to be discharged to:: Private residence Living Arrangements: Children Available Help at Discharge: Family;Available 24  hours/day Type of Home: House Home Access: Level entry     Home Layout: Two level   Alternate Level Stairs-Rails: Right Bathroom Shower/Tub: Teacher, early years/pre: Standard Bathroom Accessibility: Yes   Home Equipment: Bedside commode;Walker - 2 wheels   Additional Comments: Pt's daughter reports she plans to take the pt home with her.      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 Problem List: Decreased strength;Decreased activity  tolerance;Impaired balance (sitting and/or standing)      OT Treatment/Interventions: Self-care/ADL training;Therapeutic activities;Therapeutic exercise;Balance training    OT Goals(Current goals can be found in the care plan section) Acute Rehab OT Goals Patient Stated Goal: return home and go back to work OT Goal Formulation: With patient Time For Goal Achievement: 12/04/20 Potential to Achieve Goals: Good ADL Goals Pt Will Perform Grooming: Independently;standing Pt Will Perform Lower Body Bathing: Independently;sit to/from stand Pt Will Perform Lower Body Dressing: Independently;sit to/from stand Pt Will Transfer to Toilet: Independently;ambulating;regular height toilet Pt Will Perform Toileting - Clothing Manipulation and hygiene: Independently;sit to/from stand  OT Frequency: Min 2X/week   Barriers to D/C:    none noted       Co-evaluation              AM-PAC OT "6 Clicks" Daily Activity     Outcome Measure Help from another person eating meals?: Total Help from another person taking care of personal grooming?: None Help from another person toileting, which includes using toliet, bedpan, or urinal?: A Lot Help from another person bathing (including washing, rinsing, drying)?: A Lot Help from another person to put on and taking off regular upper body clothing?: A Little Help from another person to put on and taking off regular lower body clothing?: A Lot 6 Click Score: 14   End of Session    Activity Tolerance: Patient tolerated treatment well Patient left: in chair;with call bell/phone within reach;with chair alarm set;with family/visitor present  OT Visit Diagnosis: Unsteadiness on feet (R26.81);Muscle weakness (generalized) (M62.81)                Time: 5997-7414 OT Time Calculation (min): 23 min Charges:  OT General Charges $OT Visit: 1 Visit OT Evaluation $OT Eval Moderate Complexity: 1 Mod OT Treatments $Self Care/Home Management : 8-22  mins  11/20/2020  Rich, OTR/L  Acute Rehabilitation Services  Office:  [place] 11/20/2020, 2:54 PM

## 2020-11-20 NOTE — Progress Notes (Signed)
Progress Note  Patient Name: Victor Hunter Date of Encounter: 11/20/2020  [facility] Cardiologist: New patient  Subjective   Victor Hunter remains n.p.o. with nasogastric tube in place but denies any chest pain or shortness of breath.  Inpatient Medications    Scheduled Meds: . enoxaparin (LOVENOX) injection  30 mg Subcutaneous Daily  . hydrALAZINE  10 mg Intravenous TID  . levothyroxine  56 mcg Intravenous Daily  . pantoprazole (PROTONIX) IV  40 mg Intravenous Q24H   Continuous Infusions: . chlorproMAZINE (THORAZINE) IV 12.5 mg (11/19/20 1806)  . dextrose 5% lactated ringers with KCl 20 mEq/L 75 mL/hr at 11/20/20 0021  . piperacillin-tazobactam 3.375 g (11/20/20 0417)   PRN Meds: chlorproMAZINE (THORAZINE) IV, metoprolol tartrate, ondansetron (ZOFRAN) IV, technetium medronate   Vital Signs    Vitals:   11/19/20 0300 11/19/20 1452 11/19/20 2054 11/20/20 0419  BP: (!) 155/84 (!) 158/78 (!) 158/70 (!) 155/76  Pulse: 91 100 (!) 102 99  Resp: 18 19 19 20   Temp: 98.3 F (36.8 C) 98.7 F (37.1 C) 98.7 F (37.1 C) 99.3 F (37.4 C)  TempSrc: Oral Axillary Oral Oral  SpO2: 96% 96% 98% 94%  Weight:      Height:        Intake/Output Summary (Last 24 hours) at 11/20/2020 0916 Last data filed at 11/20/2020 0849 Gross per 24 hour  Intake 1501.71 ml  Output 1900 ml  Net -398.29 ml   Last 3 Weights 11/17/2020 11/17/2020 02/01/2017  Weight (lbs) 187 lb 6.3 oz 170 lb 10.2 oz 170 lb  Weight (kg) 85 kg 77.4 kg 77.111 kg      Telemetry    Sinus rhythm- Personally Reviewed  ECG    No new tracing- Personally Reviewed  Physical Exam  Nasogastric tube in place GEN: No acute distress.   Neck: No JVD Cardiac: RRR, no murmurs, rubs, or gallops.  Respiratory: Clear to auscultation bilaterally. GI: Soft, nontender, non-distended  MS: No edema; No deformity. Neuro:  Nonfocal  Psych: Normal affect   Labs    High Sensitivity Troponin:   Recent Labs  Lab 11/17/20 0203  11/17/20 0330 11/17/20 0752 11/17/20 0914 11/17/20 2055  TROPONINIHS 144* 223* 373* 344* 157*      Chemistry Recent Labs  Lab 11/18/20 0841 11/19/20 0324 11/20/20 0043  NA 138 141 141  K 3.6 3.3* 3.3*  CL 102 103 105  CO2 26 25 28   GLUCOSE 98 79 168*  BUN 37* 31* 28*  CREATININE 1.91* 1.77* 1.58*  CALCIUM 7.9* 8.1* 7.8*  PROT 6.1* 6.0* 5.7*  ALBUMIN 2.9* 2.7* 2.6*  AST 31 26 23   ALT 18 18 16   ALKPHOS 80 77 73  BILITOT 0.9 1.3* 1.2  GFRNONAA 34* 37* 43*  ANIONGAP 10 13 8      Hematology Recent Labs  Lab 11/17/20 2359 11/18/20 0841 11/19/20 0324  WBC 16.6* 15.8* 15.7*  RBC 4.82 4.55 4.32  HGB 12.5* 11.6* 11.2*  HCT 38.0* 36.0* 34.2*  MCV 78.8* 79.1* 79.2*  MCH 25.9* 25.5* 25.9*  MCHC 32.9 32.2 32.7  RDW 16.4* 16.2* 16.0*  PLT 190 185 187    BNP Recent Labs  Lab 11/17/20 0203  BNP 203.0*     DDimer  Recent Labs  Lab 11/17/20 0203  DDIMER 2.73*     Radiology    DG Chest 2 View  Result Date: 11/19/2020 CLINICAL DATA:  Pneumonia. EXAM: CHEST - 2 VIEW COMPARISON:  November 17, 2020. FINDINGS: Stable cardiomegaly. No pneumothorax is  noted. Small bilateral pleural effusions are noted. Nasogastric tube tip is seen in expected position of gastroesophageal junction. Right lower lobe atelectasis or infiltrate is noted. Bony thorax is unremarkable. IMPRESSION: Small bilateral pleural effusions. Right lower lobe atelectasis or infiltrate is noted. Nasogastric tube tip is seen in expected position of gastroesophageal junction. Electronically Signed   By: Marijo Conception M.D.   On: 11/19/2020 08:37   DG Abd 1 View  Result Date: 11/19/2020 CLINICAL DATA:  G-tube placement EXAM: ABDOMEN - 1 VIEW COMPARISON:  11/19/2020 FINDINGS: NG tube has been advanced. The tip is in the stomach with the side port likely near the GE junction. IMPRESSION: NG tube advanced into the stomach with the side port near the GE junction. Electronically Signed   By: Rolm Baptise M.D.   On:  11/19/2020 17:58   DG Abd 1 View  Result Date: 11/19/2020 CLINICAL DATA:  NG placement EXAM: ABDOMEN - 1 VIEW COMPARISON:  Earlier same day FINDINGS: Enteric tube passes into the stomach with side port at the gastroesophageal junction. Consider advancement. Residual contrast within the bowel. IMPRESSION: Enteric tube within the proximal stomach with the side port at the gastroesophageal junction. Consider advancement. Electronically Signed   By: Macy Mis M.D.   On: 11/19/2020 16:07   DG Abd Portable 1V-Small Bowel Obstruction Protocol-initial, 8 hr delay  Result Date: 11/19/2020 CLINICAL DATA:  Small bowel obstruction EXAM: PORTABLE ABDOMEN - 1 VIEW COMPARISON:  11/19/2020 FINDINGS: NG tube is in the stomach. Gas and contrast seen throughout the colon. Improved small bowel dilatation compatible with improving obstruction. IMPRESSION: NG tube in the proximal stomach. Improving small bowel obstruction pattern. Electronically Signed   By: Rolm Baptise M.D.   On: 11/19/2020 21:29   DG Abd Portable 1V-Small Bowel Obstruction Protocol-24 hr delay  Result Date: 11/19/2020 CLINICAL DATA:  Small-bowel obstruction EXAM: PORTABLE ABDOMEN - 1 VIEW COMPARISON:  11/18/2020 FINDINGS: 2 supine frontal views of the abdomen and pelvis demonstrate enteric catheter tip and side port projecting at the gastroesophageal junction, stable. Distended gas-filled loops of small bowel in the central abdomen are slightly less prominent on this study, measuring up to 3.4 cm in diameter. Gas again seen within the decompressed colon. No masses or abnormal calcifications. IMPRESSION: 1. Persistent gaseous distention of the small bowel, though decreased in prominence since prior study, consistent with improving obstruction. Electronically Signed   By: Randa Ngo M.D.   On: 11/19/2020 03:45    Cardiac Studies   TTE: 11/17/2020  Left ventricular ejection fraction, by  estimation, is 55 to 60%. The left ventricle has normal  function. There is  mild left ventricular hypertrophy.  Left ventricular diastolic parameters are indeterminate.  2. Right ventricular systolic function is normal. The right ventricular  size is normal.  3. The mitral valve is normal in structure. Trivial mitral valve  regurgitation.  4. The aortic valve is tricuspid. Aortic valve regurgitation is not  visualized. Mild to moderate aortic valve sclerosis/calcification is  present, without any evidence of aortic stenosis.  5. The inferior vena cava is normal in size with greater than 50%  respiratory variability, suggesting right atrial pressure of 3 mmHg.   Patient Profile     87 y.o. male   Assessment & Plan    Demand ischemia in the settings of severe hypoxia/aspiration pneumonia/elevated lactic acid - normal LVEF, no WMA, normal diastolic function, no prior chest pain - no ischemic workup is necessary - consider ASA, possibly stains in the  future - Heparin iv was discontinued  Hypertension  - with LVH on echo and ECG - hypotensive on admission sec to sepsis, now hypertensive   - restart BP meds once he is not n.p.o., restart lisinopril 20 mg po daily, for now increase hydralazine to 20 mg iv TID   We will sign off, call us with any questions.  For questions or updates, please contact [place] Please consult [email or web address] for contact info under     Signed, Ena Dawley, MD  11/20/2020, 9:16 AM

## 2020-11-21 ENCOUNTER — Inpatient Hospital Stay (HOSPITAL_COMMUNITY): Payer: Medicare PPO

## 2020-11-21 DIAGNOSIS — J9601 Acute respiratory failure with hypoxia: Secondary | ICD-10-CM | POA: Diagnosis not present

## 2020-11-21 LAB — COMPREHENSIVE METABOLIC PANEL
ALT: 21 U/L (ref 0–44)
AST: 23 U/L (ref 15–41)
Albumin: 2.7 g/dL — ABNORMAL LOW (ref 3.5–5.0)
Alkaline Phosphatase: 77 U/L (ref 38–126)
Anion gap: 9 (ref 5–15)
BUN: 23 mg/dL (ref 8–23)
CO2: 26 mmol/L (ref 22–32)
Calcium: 7.9 mg/dL — ABNORMAL LOW (ref 8.9–10.3)
Chloride: 104 mmol/L (ref 98–111)
Creatinine, Ser: 1.56 mg/dL — ABNORMAL HIGH (ref 0.61–1.24)
GFR, Estimated: 44 mL/min — ABNORMAL LOW (ref >60)
Glucose, Bld: 156 mg/dL — ABNORMAL HIGH (ref 70–99)
Potassium: 3.5 mmol/L (ref 3.5–5.1)
Sodium: 139 mmol/L (ref 135–145)
Total Bilirubin: 1.2 mg/dL (ref 0.3–1.2)
Total Protein: 6 g/dL — ABNORMAL LOW (ref 6.5–8.1)

## 2020-11-21 LAB — BLOOD GAS, ARTERIAL
Acid-Base Excess: 0.7 mmol/L (ref 0.0–2.0)
Bicarbonate: 24.3 mmol/L (ref 20.0–28.0)
Drawn by: 164
FIO2: 21
O2 Saturation: 93.5 %
Patient temperature: 36.5
pCO2 arterial: 34.3 mmHg (ref 32.0–48.0)
pH, Arterial: 7.461 — ABNORMAL HIGH (ref 7.350–7.450)
pO2, Arterial: 65.1 mmHg — ABNORMAL LOW (ref 83.0–108.0)

## 2020-11-21 LAB — LACTIC ACID, PLASMA
Lactic Acid, Venous: 3.1 mmol/L (ref 0.5–1.9)
Lactic Acid, Venous: 1.5 mmol/L (ref 0.5–1.9)

## 2020-11-21 LAB — MAGNESIUM: Magnesium: 2.2 mg/dL (ref 1.7–2.4)

## 2020-11-21 MED ORDER — POTASSIUM CHLORIDE 10 MEQ/100ML IV SOLN
10.0000 meq | INTRAVENOUS | Status: AC
  Administered 2020-11-21 (×4): 10 meq via INTRAVENOUS
  Filled 2020-11-21 (×4): qty 100

## 2020-11-21 MED ORDER — POTASSIUM CHLORIDE 2 MEQ/ML IV SOLN
INTRAVENOUS | Status: DC
  Filled 2020-11-21 (×4): qty 1000

## 2020-11-21 MED ORDER — CHLORHEXIDINE GLUCONATE CLOTH 2 % EX PADS
6.0000 | MEDICATED_PAD | Freq: Every day | CUTANEOUS | Status: DC
  Administered 2020-11-22 – 2020-12-05 (×11): 6 via TOPICAL

## 2020-11-21 MED ORDER — HYDROCHLOROTHIAZIDE 25 MG PO TABS
25.0000 mg | ORAL_TABLET | Freq: Every day | ORAL | Status: DC
  Administered 2020-11-21 – 2020-11-24 (×4): 25 mg via ORAL
  Filled 2020-11-21 (×4): qty 1

## 2020-11-21 MED ORDER — LACTATED RINGERS IV SOLN: INTRAVENOUS | Status: DC

## 2020-11-21 NOTE — Progress Notes (Signed)
Subjective: CC: Patient reports he is doing well.  NG tube clamped since yesterday morning.  He denies any abdominal pain, nausea or vomiting.  Continues of last flatus.  He had 2 BMs yesterday. Patients daughter at bedside.    Objective: Vital signs in last 24 hours: Temp:  [98.2 F (36.8 C)-98.9 F (37.2 C)] 98.2 F (36.8 C) (03/20 0406) Pulse Rate:  [70-101] 92 (03/20 0406) Resp:  [18-19] 18 (03/20 0406) BP: (167-178)/(82-91) 177/83 (03/20 0406) SpO2:  [95 %-98 %] 98 % (03/20 0406) Weight:  [79.9 kg] 79.9 kg (03/19 1130) Last BM Date: 11/20/20  Intake/Output from previous day: 03/19 0701 - 03/20 0700 In: -  Out: 300 [Urine:300] Intake/Output this shift: Total I/O In: -  Out: 225 [Urine:225]  PE: Gen: Alert, NAD, pleasant Card: Reg Pulm:Normal rate and effort, on RA Abd: Soft,mild distension, NT, + bowel sounds, ventral hernia reducible when patient lied flat. NGT in place with bilious output in cannister. Psych: A&Ox3 Skin: no rashes noted, warm and dry  Lab Results:  Recent Labs    11/19/20 0324  WBC 15.7*  HGB 11.2*  HCT 34.2*  PLT 187   BMET Recent Labs    11/20/20 0043 11/21/20 0054  NA 141 139  K 3.3* 3.5  CL 105 104  CO2 28 26  GLUCOSE 168* 156*  BUN 28* 23  CREATININE 1.58* 1.56*  CALCIUM 7.8* 7.9*   PT/INR No results for input(s): LABPROT, INR in the last 72 hours. CMP     Component Value Date/Time   NA 139 11/21/2020 0054   K 3.5 11/21/2020 0054   CL 104 11/21/2020 0054   CO2 26 11/21/2020 0054   GLUCOSE 156 (H) 11/21/2020 0054   BUN 23 11/21/2020 0054   CREATININE 1.56 (H) 11/21/2020 0054   CALCIUM 7.9 (L) 11/21/2020 0054   PROT 6.0 (L) 11/21/2020 0054   ALBUMIN 2.7 (L) 11/21/2020 0054   AST 23 11/21/2020 0054   ALT 21 11/21/2020 0054   ALKPHOS 77 11/21/2020 0054   BILITOT 1.2 11/21/2020 0054   GFRNONAA 44 (L) 11/21/2020 0054   GFRAA >60 07/23/2015 0712   Lipase     Component Value Date/Time   LIPASE 23  07/20/2015 1919       Studies/Results: DG Abd 1 View  Result Date: 11/20/2020 CLINICAL DATA:  Nasogastric tube placement EXAM: ABDOMEN - 1 VIEW COMPARISON:  11/20/2020 at 6:34 a.m. FINDINGS: The nasogastric tube is been advanced and its tip is in the stomach antrum region with side port in the stomach body. Contrast medium noted in the colon. IMPRESSION: 1. Nasogastric tube tip is in the stomach antrum region with side port in the stomach body. Electronically Signed   By: Van Clines M.D.   On: 11/20/2020 16:08   DG Abd 1 View  Result Date: 11/19/2020 CLINICAL DATA:  G-tube placement EXAM: ABDOMEN - 1 VIEW COMPARISON:  11/19/2020 FINDINGS: NG tube has been advanced. The tip is in the stomach with the side port likely near the GE junction. IMPRESSION: NG tube advanced into the stomach with the side port near the GE junction. Electronically Signed   By: Rolm Baptise M.D.   On: 11/19/2020 17:58   DG Abd 1 View  Result Date: 11/19/2020 CLINICAL DATA:  NG placement EXAM: ABDOMEN - 1 VIEW COMPARISON:  Earlier same day FINDINGS: Enteric tube passes into the stomach with side port at the gastroesophageal junction. Consider advancement. Residual contrast within the bowel.  IMPRESSION: Enteric tube within the proximal stomach with the side port at the gastroesophageal junction. Consider advancement. Electronically Signed   By: Macy Mis M.D.   On: 11/19/2020 16:07   DG Abd Portable 1V-Small Bowel Obstruction Protocol-24 hr delay  Result Date: 11/20/2020 CLINICAL DATA:  Small-bowel obstruction EXAM: PORTABLE ABDOMEN - 1 VIEW COMPARISON:  11/19/2020 FINDINGS: Contrast medium is present in the colon and rectum, similar to yesterday's exam. Contrast fills the appendix. No dilated bowel identified. Indistinct airspace opacities medially in the lower lobes, as on prior exams. Nasogastric tube is present in the stomach body. Levoconvex lumbar scoliosis. IMPRESSION: 1. Contrast is present in the colon  and rectum. No dilated bowel identified. 2. Medial bibasilar lower lobe airspace opacities, as on prior exams. Electronically Signed   By: Van Clines M.D.   On: 11/20/2020 10:42   DG Abd Portable 1V-Small Bowel Obstruction Protocol-initial, 8 hr delay  Result Date: 11/19/2020 CLINICAL DATA:  Small bowel obstruction EXAM: PORTABLE ABDOMEN - 1 VIEW COMPARISON:  11/19/2020 FINDINGS: NG tube is in the stomach. Gas and contrast seen throughout the colon. Improved small bowel dilatation compatible with improving obstruction. IMPRESSION: NG tube in the proximal stomach. Improving small bowel obstruction pattern. Electronically Signed   By: Rolm Baptise M.D.   On: 11/19/2020 21:29    Anti-infectives: Anti-infectives (From admission, onward)   Start     Dose/Rate Route Frequency Ordered Stop   11/20/20 1200  piperacillin-tazobactam (ZOSYN) IVPB 3.375 g  Status:  Discontinued        3.375 g 12.5 mL/hr over 240 Minutes Intravenous Every 8 hours 11/20/20 1034 11/21/20 0832   11/19/20 1100  vancomycin (VANCOREADY) IVPB 500 mg/100 mL  Status:  Discontinued        500 mg 100 mL/hr over 60 Minutes Intravenous Every 24 hours 11/18/20 1120 11/19/20 1755   11/18/20 1000  vancomycin (VANCOREADY) IVPB 1500 mg/300 mL        1,500 mg 150 mL/hr over 120 Minutes Intravenous Every 48 hours 11/17/20 0845 11/18/20 1145   11/17/20 1200  piperacillin-tazobactam (ZOSYN) IVPB 3.375 g  Status:  Discontinued        3.375 g 12.5 mL/hr over 240 Minutes Intravenous Every 8 hours 11/17/20 0651 11/20/20 1034   11/17/20 0445  vancomycin (VANCOCIN) IVPB 1000 mg/200 mL premix        1,000 mg 200 mL/hr over 60 Minutes Intravenous  Once 11/17/20 0439 11/17/20 0727   11/17/20 0445  piperacillin-tazobactam (ZOSYN) IVPB 3.375 g        3.375 g 100 mL/hr over 30 Minutes Intravenous  Once 11/17/20 0439 11/17/20 0555       Assessment/Plan Elevated tn - per cards, felt to be demand ischemia Possible aspiration pna - abx per  TR[facility] Hx HTN Hx of GERD Hypokalemia  Lactic acidosis - resolved  SBO Small ventral hernia-reducible. No incarceration noted on CT.  - Hx ofLap chole(2003 - Dr. 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 Zeigler-2013) - Tolerating clamping trials. D/c NGT and allow CLD.  - Mobilize for bowel function - PT rec HH - Keep K > 4 and Mg > 2 for bowel function - Hopefully patient will improve with conservative measures. We will following along closely.  FEN -D/c NGT, CLD, IVF per TRH VTE -SCDs, lovenox ID -None currently.     LOS: 4 days    Jillyn Ledger , [facility] 11/21/2020, 9:04 AM Please see Amion for pager number during day hours  7:00am-4:30pm

## 2020-11-21 NOTE — Significant Event (Signed)
Rapid Response Event Note   Reason for Call :  Gasping and abnormal respirations  Initial Focused Assessment:  Called regarding this patient having bouts of gasping.  This patient would have periods where he would wake up, moan and groan, take a deep breath, and gasp.  RN stated that during this period the patient's HR has dropped to the 40's and he is normally tachycardic.  She reports that the patient's blood pressure would become elevated.  The patient denies abdominal pain.  His abdomen is distended. Bowel sounds auscultated.  The patient has apparently had hiccups since Thursday.  Fine crackles and inspiratory wheezing noted on exam.    BP 143/69 ECG 109 RR 25  O2 98 Room air Temp 97.8 oral   Interventions:  Vitals taken and plan of care discussed with MD  Plan of Care:  Patient NPO Acute abd 2 view xray ordered- MD asked if Rapid response could accompany  Lactic acid ABG   Event Summary:   MD Notified: Samtani Call Time: Gate Time: 1805 End Time: [place], RN

## 2020-11-21 NOTE — Progress Notes (Signed)
   11/21/20 1844  Assess: MEWS Score  Temp 97.7 F (36.5 C)  BP (!) 167/98  Pulse Rate (!) 113  Resp (!) 26  Level of Consciousness Alert  SpO2 98 %  O2 Device Room Air  Assess: MEWS Score  MEWS Temp 0  MEWS Systolic 0  MEWS Pulse 2  MEWS RR 2  MEWS LOC 0  MEWS Score 4  MEWS Score Color Red  Assess: if the MEWS score is Yellow or Red  Were vital signs taken at a resting state? Yes  Focused Assessment Change from prior assessment (see assessment flowsheet)  Early Detection of Sepsis Score *See Row Information* Medium  MEWS guidelines implemented *See Row Information* Yes  Treat  MEWS Interventions Escalated (See documentation below)  Pain Scale 0-10  Pain Score 0  Take Vital Signs  Increase Vital Sign Frequency  Red: Q 1hr X 4 then Q 4hr X 4, if remains red, continue Q 4hrs  Escalate  MEWS: Escalate Red: discuss with charge nurse/RN and provider, consider discussing with RRT  Notify: Charge Nurse/RN  Name of Charge Nurse/RN Notified Kristi D  Date Charge Nurse/RN Notified 11/21/20  Time Charge Nurse/RN Notified 6720  Notify: Provider  Provider Name/Title Verneita Griffes  Date Provider Notified 11/21/20  Time Provider Notified 1850  Notification Type Page  Notification Reason Change in status  Provider response See new orders  Date of Provider Response 11/21/20  Time of Provider Response 1857  Notify: Rapid Response  Name of Rapid Response RN Notified Saralyn Pilar  Date Rapid Response Notified 11/21/20  Time Rapid Response Notified 1846  Document  Patient Outcome Not stable and remains on department  Progress note created (see row info) Yes

## 2020-11-21 NOTE — Progress Notes (Signed)
Called by RN earlier today re: patient with bouts of "gasping" and abd pains  His abd was distended--he was breathing through mouth and lung sounds were transmitted-on exam with forced nasal breaths-he was clear He didn't exhibit point tender, but was distended  I discussed clearly with him and his daughter Otila Kluver in the room to slow down fluids po for nbow  I rec'd a subsequent call at 6:55 pm that this recurred  P Stat Acute abd series Lactic acid stat Follow ABG [sats nl however]  DDX--re-obstruction? Obstipation?  Dr. Josephine Cables made aware   Verneita Griffes, MD [facility] 7:12 PM

## 2020-11-21 NOTE — Progress Notes (Signed)
PROGRESS NOTE   Victor Hunter  TGG:[id number] DOB: 08/30/1934 DOA: 11/17/2020 PCP: Rosita Fire, MD  Brief Narrative:   87 year old black male community dwelling BPH + epididymitis 2016 status post TURP 2008 Mallory-Weiss tear 2005+ readmission 2013 cryptogenic anemia-was supposed to have elective hernia repair?  And was to follow-up with Dr. Buford Dresser in [place] at the time Prior RUE DVT 04/2012 Hypothyroid  Prior SBO 04/22/2012 = adynamic ileus??-Intubated/ventilated-had septic shock at the time  Admit to [facility] 3/16 4 days abdominal distention constipation obstipation, increased WOB O2 sats 78% on arrival of EMS 15 L Hypotensive 100/60 BUNs/creatinine 33/2.0 (baseline 1.4 Troponin peak 140 4200 Lactic acid 5  CT abdomen pelvis = SBO + distal jejunal transition point?  Airspace disease on CT NG tube placed 800 cc coffee-ground/negative Gastroccult Rx heparin plus ASA (2/2 ? troponin) Dr. Constance Haw General surgery recommended transfer-->[facility]--> general surgery evaluated  Apparently been passing flatus Gastrografin X2-General surgery consulted and seen-patient now passing stool Treating for pneumonia and narrowing to Zosyn   NG tube pulled 3/20    Hospital-Problem based course  SBO in the setting of prior adynamic ileus 2013  Defer to gen surgery next steps  Saline locked today as plethoric and slight crackles in chest severe sepsis on admission ?  PNA  CXR 3/17 more consistent with atelectasis  DC vancomycin  continue Zosyn-stop date 3/19   Leukocytosis persistent--re-check am NSTEMI  Cardiology d/c on 3/19 heparin  Echocardiogram 3/16 no wall motion abnormalities suspect related to stress of SBO?  Sepsis  Lisinopril 20, HCTZ, Cardizem 180 on hold at this Time  As needed metoprolol IV for blood pressure control Prior right upper extremity DVT 2013  No longer on anticoagulation  May benefit from ASA  Discussion as outpatient Hypothyroid  Replace Synthroid IV half usual  dose 56 Hypokalemia  Replace with K runs + fluids, am Mag   DVT prophylaxis: SCD at this time Code Status: Full Family Communication: Discussed with daughter at the bedside 3/20 Disposition:  Status is: Inpatient  Remains inpatient appropriate because:Persistent severe electrolyte disturbances, Altered mental status and IV treatments appropriate due to intensity of illness or inability to take PO   Dispo: The patient is from: Home              Anticipated d/c is to: SNF              Patient currently is not medically stable to d/c.   Difficult to place patient No    Consultants:   General surgery  Cardiology  Procedures: Multiple CTs  Antimicrobials: Vancomycin DC 3/18-continuing Zosyn   Subjective:  looks well Eating ++ clears, multiple stools ,soft No cp-not on oxygen  Objective: Vitals:   11/20/20 1641 11/20/20 2000 11/21/20 0406 11/21/20 1217  BP: (!) 172/89 (!) 178/82 (!) 177/83 131/73  Pulse:  (!) 101 92 98  Resp:  18 18 17   Temp:  98.5 F (36.9 C) 98.2 F (36.8 C) 98.7 F (37.1 C)  TempSrc:  Oral  Oral  SpO2:  95% 98% 97%  Weight:      Height:        Intake/Output Summary (Last 24 hours) at 11/21/2020 1612 Last data filed at 11/21/2020 1509 Gross per 24 hour  Intake 975.93 ml  Output 225 ml  Net 750.93 ml   Filed Weights   11/17/20 0156 11/17/20 2230 11/20/20 1130  Weight: 77.4 kg 85 kg 79.9 kg    Examination:  coherent no distress EOMI NCAT NG  out Chest clear, decreased AE Abdomen distended no rebound no guarding Neurologically intact no focal deficit ROM intact Psych euthymic   Data Reviewed: personally reviewed    CBC    Component Value Date/Time   WBC 15.7 (H) 11/19/2020 0324   RBC 4.32 11/19/2020 0324   HGB 11.2 (L) 11/19/2020 0324   HCT 34.2 (L) 11/19/2020 0324   PLT 187 11/19/2020 0324   MCV 79.2 (L) 11/19/2020 0324   MCH 25.9 (L) 11/19/2020 0324   MCHC 32.7 11/19/2020 0324   RDW 16.0 (H) 11/19/2020 0324   LYMPHSABS  1.4 11/19/2020 0324   MONOABS 1.1 (H) 11/19/2020 0324   EOSABS 0.0 11/19/2020 0324   BASOSABS 0.0 11/19/2020 0324   CMP Latest Ref Rng & Units 11/21/2020 11/20/2020 11/19/2020  Glucose 70 - 99 mg/dL 156(H) 168(H) 79  BUN 8 - 23 mg/dL 23 28(H) 31(H)  Creatinine 0.61 - 1.24 mg/dL 1.56(H) 1.58(H) 1.77(H)  Sodium 135 - 145 mmol/L 139 141 141  Potassium 3.5 - 5.1 mmol/L 3.5 3.3(L) 3.3(L)  Chloride 98 - 111 mmol/L 104 105 103  CO2 22 - 32 mmol/L 26 28 25   Calcium 8.9 - 10.3 mg/dL 7.9(L) 7.8(L) 8.1(L)  Total Protein 6.5 - 8.1 g/dL 6.0(L) 5.7(L) 6.0(L)  Total Bilirubin 0.3 - 1.2 mg/dL 1.2 1.2 1.3(H)  Alkaline Phos 38 - 126 U/L 77 73 77  AST 15 - 41 U/L 23 23 26   ALT 0 - 44 U/L 21 16 18      Radiology Studies: DG Abd 1 View  Result Date: 11/20/2020 CLINICAL DATA:  Nasogastric tube placement EXAM: ABDOMEN - 1 VIEW COMPARISON:  11/20/2020 at 6:34 a.m. FINDINGS: The nasogastric tube is been advanced and its tip is in the stomach antrum region with side port in the stomach body. Contrast medium noted in the colon. IMPRESSION: 1. Nasogastric tube tip is in the stomach antrum region with side port in the stomach body. Electronically Signed   By: Van Clines M.D.   On: 11/20/2020 16:08   DG Abd 1 View  Result Date: 11/19/2020 CLINICAL DATA:  G-tube placement EXAM: ABDOMEN - 1 VIEW COMPARISON:  11/19/2020 FINDINGS: NG tube has been advanced. The tip is in the stomach with the side port likely near the GE junction. IMPRESSION: NG tube advanced into the stomach with the side port near the GE junction. Electronically Signed   By: Rolm Baptise M.D.   On: 11/19/2020 17:58   DG Abd Portable 1V-Small Bowel Obstruction Protocol-24 hr delay  Result Date: 11/20/2020 CLINICAL DATA:  Small-bowel obstruction EXAM: PORTABLE ABDOMEN - 1 VIEW COMPARISON:  11/19/2020 FINDINGS: Contrast medium is present in the colon and rectum, similar to yesterday's exam. Contrast fills the appendix. No dilated bowel identified.  Indistinct airspace opacities medially in the lower lobes, as on prior exams. Nasogastric tube is present in the stomach body. Levoconvex lumbar scoliosis. IMPRESSION: 1. Contrast is present in the colon and rectum. No dilated bowel identified. 2. Medial bibasilar lower lobe airspace opacities, as on prior exams. Electronically Signed   By: Van Clines M.D.   On: 11/20/2020 10:42   DG Abd Portable 1V-Small Bowel Obstruction Protocol-initial, 8 hr delay  Result Date: 11/19/2020 CLINICAL DATA:  Small bowel obstruction EXAM: PORTABLE ABDOMEN - 1 VIEW COMPARISON:  11/19/2020 FINDINGS: NG tube is in the stomach. Gas and contrast seen throughout the colon. Improved small bowel dilatation compatible with improving obstruction. IMPRESSION: NG tube in the proximal stomach. Improving small bowel obstruction pattern. Electronically Signed  By: Rolm Baptise M.D.   On: 11/19/2020 21:29     Scheduled Meds: . enoxaparin (LOVENOX) injection  30 mg Subcutaneous Daily  . hydrALAZINE  20 mg Intravenous TID  . hydrochlorothiazide  25 mg Oral Daily  . levothyroxine  56 mcg Intravenous Daily  . pantoprazole (PROTONIX) IV  40 mg Intravenous Q24H   Continuous Infusions: . chlorproMAZINE (THORAZINE) IV Stopped (11/21/20 1441)  . dextrose 5 % lactated ringers with kcl Stopped (11/21/20 1308)     LOS: 4 days   Time spent: 20 minutes  Nita Sells, MD [facility] To contact the attending provider between 7A-7P or the covering provider during after hours 7P-7A, please log into the web site [email or web address] and access using universal [place] password for that web site. If you do not have the password, please call the hospital operator.  11/21/2020, 4:12 PM

## 2020-11-21 NOTE — Progress Notes (Signed)
Physical Therapy Treatment Patient Details Name: Victor Hunter MRN: [id number] DOB: 1934-04-02 Today's Date: 11/21/2020    History of Present Illness Pt is an 87 y/o male admitted 3/15 for acute respiratory failure from PNA and SBO. Pt had NG tube upon presentation and it was taken out on 3/20. PMH includes SBO, HTN, and CAD.    PT Comments    Pt progressing towards goals. Improved tolerance for gait, however, continues to be limited secondary to fatigue. Required min guard A for gait using RW. Educated about using RW at home. Will continue to follow acutely to maximize functional mobility independence and safety.     Follow Up Recommendations  Home health PT     Equipment Recommendations  None recommended by PT    Recommendations for Other Services       Precautions / Restrictions Precautions Precautions: Fall Restrictions Weight Bearing Restrictions: No    Mobility  Bed Mobility               General bed mobility comments: In chair upon entry    Transfers Overall transfer level: Needs assistance Equipment used: Rolling walker (2 wheeled) Transfers: Sit to/from Stand Sit to Stand: Min guard         General transfer comment: Min guard for safety. Cues for hand placement.  Ambulation/Gait Ambulation/Gait assistance: Min guard Gait Distance (Feet): 50 Feet Assistive device: Rolling walker (2 wheeled) Gait Pattern/deviations: Step-through pattern;Decreased stride length Gait velocity: Decreased   General Gait Details: Cautious gait, but overall steady with use of RW. Limited secondary to fatigue.   Stairs             Wheelchair Mobility    Modified Rankin (Stroke Patients Only)       Balance Overall balance assessment: Needs assistance Sitting-balance support: Feet supported Sitting balance-Leahy Scale: Good     Standing balance support: Bilateral upper extremity supported;No upper extremity supported Standing balance-Leahy Scale:  Fair Standing balance comment: Able to maintain static standing on UE support                            Cognition Arousal/Alertness: Awake/alert Behavior During Therapy: WFL for tasks assessed/performed Overall Cognitive Status: Within Functional Limits for tasks assessed                                        Exercises      General Comments        Pertinent Vitals/Pain Pain Assessment: Faces Faces Pain Scale: Hurts a little bit Pain Location: abdomen Pain Descriptors / Indicators: Aching Pain Intervention(s): Limited activity within patient's tolerance;Monitored during session;Repositioned    Home Living                      Prior Function            PT Goals (current goals can now be found in the care plan section) Acute Rehab PT Goals Patient Stated Goal: return home and go back to work PT Goal Formulation: With patient Time For Goal Achievement: 12/03/20 Potential to Achieve Goals: Good Progress towards PT goals: Progressing toward goals    Frequency    Min 3X/week      PT Plan Current plan remains appropriate    Co-evaluation              AM-PAC  PT "6 Clicks" Mobility   Outcome Measure  Help needed turning from your back to your side while in a flat bed without using bedrails?: None Help needed moving from lying on your back to sitting on the side of a flat bed without using bedrails?: A Little Help needed moving to and from a bed to a chair (including a wheelchair)?: A Little Help needed standing up from a chair using your arms (e.g., wheelchair or bedside chair)?: A Little Help needed to walk in hospital room?: A Little Help needed climbing 3-5 steps with a railing? : A Lot 6 Click Score: 18    End of Session Equipment Utilized During Treatment: Gait belt Activity Tolerance: Patient limited by fatigue Patient left: in chair;with call bell/phone within reach;with family/visitor present Nurse  Communication: Mobility status PT Visit Diagnosis: Muscle weakness (generalized) (M62.81)     Time: 2947-6546 PT Time Calculation (min) (ACUTE ONLY): 13 min  Charges:  $Gait Training: 8-22 mins                     [name], DPT  Acute Rehabilitation Services  Pager: [phone] Office: [phone]    [name] 11/21/2020, 1:43 PM

## 2020-11-22 ENCOUNTER — Inpatient Hospital Stay: Payer: Self-pay

## 2020-11-22 DIAGNOSIS — R7989 Other specified abnormal findings of blood chemistry: Secondary | ICD-10-CM | POA: Diagnosis not present

## 2020-11-22 DIAGNOSIS — J9601 Acute respiratory failure with hypoxia: Secondary | ICD-10-CM | POA: Diagnosis not present

## 2020-11-22 DIAGNOSIS — N179 Acute kidney failure, unspecified: Secondary | ICD-10-CM | POA: Diagnosis not present

## 2020-11-22 DIAGNOSIS — J690 Pneumonitis due to inhalation of food and vomit: Secondary | ICD-10-CM | POA: Diagnosis not present

## 2020-11-22 LAB — CBC WITH DIFFERENTIAL/PLATELET
Abs Immature Granulocytes: 0.14 10*3/uL — ABNORMAL HIGH (ref 0.00–0.07)
Basophils Absolute: 0 10*3/uL (ref 0.0–0.1)
Basophils Relative: 0 %
Eosinophils Absolute: 0 10*3/uL (ref 0.0–0.5)
Eosinophils Relative: 0 %
HCT: 33.5 % — ABNORMAL LOW (ref 39.0–52.0)
Hemoglobin: 10.7 g/dL — ABNORMAL LOW (ref 13.0–17.0)
Immature Granulocytes: 1 %
Lymphocytes Relative: 10 %
Lymphs Abs: 1.5 10*3/uL (ref 0.7–4.0)
MCH: 25.5 pg — ABNORMAL LOW (ref 26.0–34.0)
MCHC: 31.9 g/dL (ref 30.0–36.0)
MCV: 80 fL (ref 80.0–100.0)
Monocytes Absolute: 1.4 10*3/uL — ABNORMAL HIGH (ref 0.1–1.0)
Monocytes Relative: 9 %
Neutro Abs: 12.5 10*3/uL — ABNORMAL HIGH (ref 1.7–7.7)
Neutrophils Relative %: 80 %
Platelets: 209 10*3/uL (ref 150–400)
RBC: 4.19 MIL/uL — ABNORMAL LOW (ref 4.22–5.81)
RDW: 16.3 % — ABNORMAL HIGH (ref 11.5–15.5)
WBC: 15.6 10*3/uL — ABNORMAL HIGH (ref 4.0–10.5)
nRBC: 0 % (ref 0.0–0.2)

## 2020-11-22 LAB — COMPREHENSIVE METABOLIC PANEL
ALT: 31 U/L (ref 0–44)
AST: 31 U/L (ref 15–41)
Albumin: 2.5 g/dL — ABNORMAL LOW (ref 3.5–5.0)
Alkaline Phosphatase: 75 U/L (ref 38–126)
Anion gap: 6 (ref 5–15)
BUN: 21 mg/dL (ref 8–23)
CO2: 27 mmol/L (ref 22–32)
Calcium: 8 mg/dL — ABNORMAL LOW (ref 8.9–10.3)
Chloride: 107 mmol/L (ref 98–111)
Creatinine, Ser: 1.47 mg/dL — ABNORMAL HIGH (ref 0.61–1.24)
GFR, Estimated: 47 mL/min — ABNORMAL LOW (ref >60)
Glucose, Bld: 136 mg/dL — ABNORMAL HIGH (ref 70–99)
Potassium: 3.5 mmol/L (ref 3.5–5.1)
Sodium: 140 mmol/L (ref 135–145)
Total Bilirubin: 0.8 mg/dL (ref 0.3–1.2)
Total Protein: 5.9 g/dL — ABNORMAL LOW (ref 6.5–8.1)

## 2020-11-22 LAB — PROCALCITONIN: Procalcitonin: 4.61 ng/mL

## 2020-11-22 LAB — CULTURE, BLOOD (ROUTINE X 2)
Culture: NO GROWTH
Culture: NO GROWTH
Special Requests: ADEQUATE
Special Requests: ADEQUATE

## 2020-11-22 MED ORDER — PANTOPRAZOLE SODIUM 40 MG IV SOLR
40.0000 mg | Freq: Two times a day (BID) | INTRAVENOUS | Status: DC
  Administered 2020-11-22: 40 mg via INTRAVENOUS
  Filled 2020-11-22 (×2): qty 40

## 2020-11-22 MED ORDER — PANTOPRAZOLE SODIUM 40 MG PO TBEC
40.0000 mg | DELAYED_RELEASE_TABLET | Freq: Two times a day (BID) | ORAL | Status: DC
  Administered 2020-11-22: 40 mg via ORAL
  Filled 2020-11-22: qty 1

## 2020-11-22 MED ORDER — SODIUM CHLORIDE 0.9 % IV SOLN: 25.0000 mg | Freq: Three times a day (TID) | INTRAVENOUS | Status: DC | PRN

## 2020-11-22 MED ORDER — PANTOPRAZOLE SODIUM 40 MG PO TBEC
40.0000 mg | DELAYED_RELEASE_TABLET | Freq: Two times a day (BID) | ORAL | Status: DC
  Administered 2020-11-23 – 2020-12-05 (×24): 40 mg via ORAL
  Filled 2020-11-22 (×25): qty 1

## 2020-11-22 MED ORDER — PIPERACILLIN-TAZOBACTAM 3.375 G IVPB
3.3750 g | Freq: Three times a day (TID) | INTRAVENOUS | Status: DC
  Administered 2020-11-22 – 2020-11-25 (×8): 3.375 g via INTRAVENOUS
  Filled 2020-11-22 (×9): qty 50

## 2020-11-22 MED ORDER — ENOXAPARIN SODIUM 40 MG/0.4ML ~~LOC~~ SOLN
40.0000 mg | Freq: Every day | SUBCUTANEOUS | Status: DC
  Administered 2020-11-23 – 2020-11-25 (×3): 40 mg via SUBCUTANEOUS
  Filled 2020-11-22 (×3): qty 0.4

## 2020-11-22 MED ORDER — SODIUM CHLORIDE 0.9 % IV SOLN
25.0000 mg | Freq: Three times a day (TID) | INTRAVENOUS | Status: DC
  Administered 2020-11-22 – 2020-11-30 (×24): 25 mg via INTRAVENOUS
  Filled 2020-11-22 (×28): qty 1

## 2020-11-22 MED ORDER — ACETAMINOPHEN 325 MG PO TABS: 650.0000 mg | ORAL_TABLET | Freq: Four times a day (QID) | ORAL | Status: DC | PRN

## 2020-11-22 MED ORDER — SIMETHICONE 80 MG PO CHEW
80.0000 mg | CHEWABLE_TABLET | Freq: Once | ORAL | Status: AC
  Administered 2020-11-22: 80 mg via ORAL
  Filled 2020-11-22: qty 1

## 2020-11-22 NOTE — Progress Notes (Signed)
Abdominal x-ray with chest view suggestive of possible pneumonia.  Procalcitonin ordered prior to starting antibiotics.

## 2020-11-22 NOTE — Care Management Important Message (Signed)
Important Message  Patient Details  Name: Victor Hunter MRN: [id number] Date of Birth: Nov 29, 1933   Medicare Important Message Given:  Yes     Orbie Pyo 11/22/2020, 3:46 PM

## 2020-11-22 NOTE — Progress Notes (Addendum)
PROGRESS NOTE   Victor BORDONARO  KXF:[id number] DOB: 09-13-1933 DOA: 11/17/2020 PCP: Rosita Fire, MD  Brief Narrative:   87 year old black male community dwelling BPH + epididymitis 2016 status post TURP 2008 Mallory-Weiss tear 2005+ readmission 2013 cryptogenic anemia-was supposed to have elective hernia repair?  And was to follow-up with Dr. Buford Dresser in [place] at the time Prior RUE DVT 04/2012 Hypothyroid  Prior SBO 04/22/2012 = adynamic ileus??-Intubated/ventilated-had septic shock at the time  Admit to [facility] 3/16 4 days abdominal distention constipation obstipation, increased WOB O2 sats 78% on arrival of EMS 15 L Hypotensive 100/60 BUNs/creatinine 33/2.0 (baseline 1.4 Troponin peak 140 4200 Lactic acid 5  CT abdomen pelvis = SBO + distal jejunal transition point?  Airspace disease on CT NG tube placed 800 cc coffee-ground/negative Gastroccult Rx heparin plus ASA (2/2 ? troponin) Dr. Constance Haw General surgery recommended transfer-->[facility]--> general surgery evaluated  passing flatus Gastrografin X2-General surgery consulted and seen- Treating for pneumonia and narrowing to Zosyn and extended to 14 days therapy  NG tube pulled 3/20 however patient has been having obstipation and hiccups valve and is no longer passing stool    Hospital-Problem based course  SBO in the setting of prior adynamic ileus 2013 Defer to gen surgery next steps Start TPN as stool output has stalled and patient is tolerating clears but no clear idea of why he developed a lactic acidosis and an elevated prolactin Continuing and extending Zosyn Likely if has persisting leukocytosis will need limited contrast CT abdomen pelvis to rule out other causes for leukocytosis severe sepsis on admission ?  PNA CXR 3/17-repeat abdominal series confirms persisting atelectasis versus pneumonia [which is expected] DC vancomycin continue Zosyn-initially stopped on 3/19 but resumed 3/21 after events of  3/20 Leukocytosis persistent NSTEMI  Cardiology d/c on 3/19 heparin  Echocardiogram 3/16 no wall motion abnormalities suspect related to stress of SBO?  Sepsis  Lisinopril 20, HCTZ, Cardizem 180 on hold at this Time  As needed metoprolol IV for blood pressure control Prior right upper extremity DVT 2013  No longer on anticoagulation  May benefit from ASA  Discussion as outpatient Hypothyroid  Replace Synthroid IV half usual dose 56 Hypokalemia  Stopped IV fluid as becoming plethoric   DVT prophylaxis: SCD at this time Code Status: Full Family Communication: Discussed with daughter Otila Kluver at the bedside 3/21 Disposition:  Status is: Inpatient  Remains inpatient appropriate because:Persistent severe electrolyte disturbances, Altered mental status and IV treatments appropriate due to intensity of illness or inability to take PO   Dispo: The patient is from: Home              Anticipated d/c is to: SNF              Patient currently is not medically stable to d/c.   Difficult to place patient No    Consultants:   General surgery  Cardiology  Procedures: Multiple CTs  Antimicrobials: Vancomycin DC 3/18-continuing Zosyn   Subjective:  Only flatus no stool Hiccuping I have placed Thorazine as scheduled every 8 higher dose of 25 hoping that this will help No chest pain NG tube remains out Objective: Vitals:   11/22/20 0229 11/22/20 0349 11/22/20 0628 11/22/20 1132  BP: (!) 157/79  (!) 167/80 (!) 151/78  Pulse: 92  93 77  Resp: (!) 22  (!) 22 20  Temp: 99.1 F (37.3 C)  98.2 F (36.8 C) 98.1 F (36.7 C)  TempSrc:      SpO2: 97%  98%  100%  Weight:  78.2 kg    Height:        Intake/Output Summary (Last 24 hours) at 11/22/2020 1510 Last data filed at 11/22/2020 0900 Gross per 24 hour  Intake 0 ml  Output 575 ml  Net -575 ml   Filed Weights   11/17/20 2230 11/20/20 1130 11/22/20 0349  Weight: 85 kg 79.9 kg 78.2 kg    Examination:  coherent no distress EOMI  NCAT NG out Decreased air entry posterolaterally-no rhonchi rales Abdomen more distended to my exam Neurologically intact no focal deficit ROM intact Psych euthymic   Data Reviewed: personally reviewed    CBC    Component Value Date/Time   WBC 15.6 (H) 11/22/2020 0131   RBC 4.19 (L) 11/22/2020 0131   HGB 10.7 (L) 11/22/2020 0131   HCT 33.5 (L) 11/22/2020 0131   PLT 209 11/22/2020 0131   MCV 80.0 11/22/2020 0131   MCH 25.5 (L) 11/22/2020 0131   MCHC 31.9 11/22/2020 0131   RDW 16.3 (H) 11/22/2020 0131   LYMPHSABS 1.5 11/22/2020 0131   MONOABS 1.4 (H) 11/22/2020 0131   EOSABS 0.0 11/22/2020 0131   BASOSABS 0.0 11/22/2020 0131   CMP Latest Ref Rng & Units 11/22/2020 11/21/2020 11/20/2020  Glucose 70 - 99 mg/dL 136(H) 156(H) 168(H)  BUN 8 - 23 mg/dL 21 23 28(H)  Creatinine 0.61 - 1.24 mg/dL 1.47(H) 1.56(H) 1.58(H)  Sodium 135 - 145 mmol/L 140 139 141  Potassium 3.5 - 5.1 mmol/L 3.5 3.5 3.3(L)  Chloride 98 - 111 mmol/L 107 104 105  CO2 22 - 32 mmol/L 27 26 28   Calcium 8.9 - 10.3 mg/dL 8.0(L) 7.9(L) 7.8(L)  Total Protein 6.5 - 8.1 g/dL 5.9(L) 6.0(L) 5.7(L)  Total Bilirubin 0.3 - 1.2 mg/dL 0.8 1.2 1.2  Alkaline Phos 38 - 126 U/L 75 77 73  AST 15 - 41 U/L 31 23 23   ALT 0 - 44 U/L 31 21 16      Radiology Studies: DG ABD ACUTE 2+V W 1V CHEST  Result Date: 11/21/2020 CLINICAL DATA:  Free air, shortness of breath EXAM: DG ABDOMEN ACUTE WITH 1 VIEW CHEST COMPARISON:  08/22/2021 FINDINGS: No visible free air. No organomegaly or suspicious calcification. Nonobstructive bowel gas pattern. Prior cholecystectomy. Airspace opacity throughout the left mid and lower lung concerning for pneumonia. No confluent opacity on the right. No effusions. Heart is upper limits normal in size. IMPRESSION: No evidence of bowel obstruction or free air. Airspace opacity throughout the left mid and lower lung concerning for pneumonia. Electronically Signed   By: Rolm Baptise M.D.   On: 11/21/2020 21:16   [place]  EKG SITE RITE  Result Date: 11/22/2020 If Site Rite image not attached, placement could not be confirmed due to current cardiac rhythm.    Scheduled Meds: . Chlorhexidine Gluconate Cloth  6 each Topical Daily  . [START ON 11/23/2020] enoxaparin (LOVENOX) injection  40 mg Subcutaneous Daily  . hydrALAZINE  20 mg Intravenous TID  . hydrochlorothiazide  25 mg Oral Daily  . levothyroxine  56 mcg Intravenous Daily  . pantoprazole (PROTONIX) IV  40 mg Intravenous Q12H   Or  . pantoprazole  40 mg Oral Q12H   Continuous Infusions: . chlorproMAZINE (THORAZINE) IV    . piperacillin-tazobactam (ZOSYN)  IV 3.375 g (11/22/20 1251)     LOS: 5 days   Time spent: 30 minutes  Nita Sells, MD [facility] To contact the attending Helen Cuff between 7A-7P or the covering Brennah Quraishi during after hours 7P-7A,  please log into the web site [email or web address] and access using universal [place] password for that web site. If you do not have the password, please call the hospital operator.  11/22/2020, 3:10 PM

## 2020-11-22 NOTE — TOC Initial Note (Signed)
Transition of Care [facility]) - Initial/Assessment Note    Patient Details  Name: Victor Hunter MRN: [id number] Date of Birth: 01/24/1934  [facility] of [facility]) CM/SW Contact:    Marilu Favre, RN Phone Number: 11/22/2020, 12:50 PM  Clinical Narrative:                 Damaris Schooner to patient and daughter Mardene Celeste at bedside.   Patient from home alone. Confirmed face sheet information.   Discussed home health PT. Patient in agreement.   Patient already has walker, cane, wheel chair and shower chair at home.   Provided medicare.gov list of home health agencies. Patient and Mardene Celeste will review. Mardene Celeste has NCM direct cell number.   Explained on admission visit visit [place] will do an assessment and discuss frequency of visits. Usually visits for PT are 2 to 3 times a week around 45 minutes.   Expected Discharge Plan: [place] Barriers to Discharge: Continued Medical Work up   Patient Goals and CMS Choice Patient states their goals for this hospitalization and ongoing recovery are:: to return to home CMS Medicare.gov Compare Post Acute Care list provided to:: Patient Choice offered to / list presented to : Patient,Adult Children Mardene Celeste daughter)  Expected Discharge Plan and Services Expected Discharge Plan: [place]   Discharge Planning Services: CM Consult Post Acute Care Choice: [place] arrangements for the past 2 months: Single Family Home                 DME Arranged: N/A DME Agency: NA       HH Arranged: PT          Prior Living Arrangements/Services Living arrangements for the past 2 months: Single Family Home Lives with:: Self Patient language and need for interpreter reviewed:: Yes        Need for Family Participation in Patient Care: Yes (Comment) Care giver support system in place?: Yes (comment) Current home services: DME Criminal Activity/Legal Involvement Pertinent to Current  Situation/Hospitalization: No - Comment as needed  Activities of Daily Living      Permission Sought/Granted   Permission granted to share information with : Yes, Verbal Permission Granted  Share Information with NAME: Mardene Celeste daughter           Emotional Assessment Appearance:: Appears stated age Attitude/Demeanor/Rapport: Engaged Affect (typically observed): Accepting Orientation: : Oriented to Self,Oriented to Place,Oriented to  Time,Oriented to Situation Alcohol / Substance Use: Not Applicable Psych Involvement: No (comment)  Admission diagnosis:  Hypokalemia [E87.6] Small bowel obstruction (HCC) [K56.609] SBO (small bowel obstruction) ([place]) [K56.609] Hypoxia [R09.02] Elevated troponin [R77.8] AKI (acute kidney injury) ([place]) [N17.9] Patient Active Problem List   Diagnosis Date Noted  . Small bowel obstruction ([place]) 11/17/2020  . Flash pulmonary edema ([place]) 11/17/2020  . Elevated brain natriuretic peptide (BNP) level 11/17/2020  . Elevated d-dimer 11/17/2020  . Ventral hernia 01/17/2017  . Essential hypertension   . Complicated UTI (urinary tract infection) 07/21/2015  . Fever in adult   . Pain in left testicle   . Epididymitis, left   . Hx of toxic multinodular goiter   . Multiple gastric polyps   . Postoperative hypothyroidism   . Elevated PSA   . Urinary tract infection 07/20/2015  . BPH (benign prostatic hyperplasia) 07/20/2015  . Encounter for screening colonoscopy 09/26/2012  . Gastroesophageal reflux disease with hiatal hernia 09/10/2012  . Hemorrhoids, external, with complication 63/14/9702  . Hematemesis 08/27/2012  Class: Acute  . Syncope and collapse 08/27/2012  . SIRS (systemic inflammatory response syndrome) ([place]) 08/27/2012  . DVT of upper extremity (deep vein thrombosis) ([place]) 05/04/2012  . CAP (community acquired pneumonia) 05/01/2012  . Protein calorie malnutrition ([place]) 05/01/2012  . Fever 05/01/2012  . UTI (lower urinary tract  infection) 05/01/2012  . Leukocytosis 05/01/2012  . Elevated troponin 05/01/2012  . Hypothyroidism 05/01/2012  . Sepsis ([place]) 05/01/2012  . Acute respiratory failure with hypoxia ([place]) 04/27/2012  . Respiratory alkalosis 04/27/2012  . Aspiration pneumonia ([place]) 04/27/2012  . Hypokalemia 04/27/2012  . AKI (acute kidney injury) ([place]) 04/27/2012  . S/P exploratory laparotomy 04/27/2012  . Adynamic ileus ([place]) 04/27/2012   PCP:  Rosita Fire, MD Pharmacy:   [facility], [address] [place] [address] Phone: [phone] Fax: [phone]     Social Determinants of Health (SDOH) Interventions    Readmission Risk Interventions No flowsheet data found.

## 2020-11-22 NOTE — Progress Notes (Signed)
Physical Therapy Treatment Patient Details Name: Victor Hunter MRN: [id number] DOB: 1934/07/28 Today's Date: 11/22/2020    History of Present Illness Pt is an 87 y/o male admitted 3/15 for acute respiratory failure from PNA and SBO. Pt had NG tube upon presentation and it was taken out on 3/20. PMH includes SBO, HTN, and CAD.    PT Comments    Pt states he has mobilized OOB 2X already today, but agreeable to additional OOB mobility. Pt ambulatory in hallway with RW and min guard for form/safety, PT cuing rest break given DOE 2/4 and SpO2 90% on RA. Pt did not need supplemental O2 during session, pt's daughter at bedside notified of this. At end of session, pt with gasping breath sounds with accompanying tachycardia to 118 bpm. RN present at end of session and witnessed this, when pt asked to describe it pt states "I can't catch my breath and it hurts here (points to LUQ area), but it only lasts for seconds". PT to continue to follow acutely.     Follow Up Recommendations  Home health PT     Equipment Recommendations  None recommended by PT    Recommendations for Other Services       Precautions / Restrictions Precautions Precautions: Fall Precaution Comments: sats Restrictions Weight Bearing Restrictions: No    Mobility  Bed Mobility Overal bed mobility: Needs Assistance Bed Mobility: Supine to Sit;Sit to Supine     Supine to sit: Min assist Sit to supine: Min assist   General bed mobility comments: min assist for trunk elevation off of bed, LE lifting into bed upon return to supine. mod +2 for boost up in bed with use of bed pads and HOB flat    Transfers Overall transfer level: Needs assistance Equipment used: Rolling walker (2 wheeled) Transfers: Sit to/from Stand Sit to Stand: Min guard         General transfer comment: for safety, verbal cuing for hand placement when rising/lowering. STS x2, from EOB and bench in hallway.  Ambulation/Gait Ambulation/Gait  assistance: Min guard Gait Distance (Feet): 65 Feet Assistive device: Rolling walker (2 wheeled) Gait Pattern/deviations: Step-through pattern;Decreased stride length;Trunk flexed Gait velocity: decr   General Gait Details: min guard for safety, verbal cuing for upright posture, placement in RW. DOE 2/4 with SpO18min 90% on RA, PT cuing pt for pursed lip breathing, slowing breath. After 2 minute seated rest break and recovery of DOE, to ambulated additional 65 ft.   Stairs             Wheelchair Mobility    Modified Rankin (Stroke Patients Only)       Balance Overall balance assessment: Needs assistance Sitting-balance support: Feet supported Sitting balance-Leahy Scale: Good     Standing balance support: Bilateral upper extremity supported;No upper extremity supported Standing balance-Leahy Scale: Fair                              Cognition Arousal/Alertness: Awake/alert Behavior During Therapy: WFL for tasks assessed/performed Overall Cognitive Status: Within Functional Limits for tasks assessed                                        Exercises      General Comments General comments (skin integrity, edema, etc.): pt's daughter present for session      Pertinent Vitals/Pain Pain  Assessment: Faces Faces Pain Scale: Hurts a little bit Pain Location: abdomen - indigestion, hiccups Pain Descriptors / Indicators: Discomfort;Grimacing Pain Intervention(s): Limited activity within patient's tolerance;Monitored during session;Repositioned    Home Living                      Prior Function            PT Goals (current goals can now be found in the care plan section) Acute Rehab PT Goals Patient Stated Goal: return home and go back to work PT Goal Formulation: With patient Time For Goal Achievement: 12/03/20 Potential to Achieve Goals: Good Progress towards PT goals: Progressing toward goals    Frequency    Min  3X/week      PT Plan Current plan remains appropriate    Co-evaluation              AM-PAC PT "6 Clicks" Mobility   Outcome Measure  Help needed turning from your back to your side while in a flat bed without using bedrails?: None Help needed moving from lying on your back to sitting on the side of a flat bed without using bedrails?: A Little Help needed moving to and from a bed to a chair (including a wheelchair)?: A Little Help needed standing up from a chair using your arms (e.g., wheelchair or bedside chair)?: A Little Help needed to walk in hospital room?: A Little Help needed climbing 3-5 steps with a railing? : A Little 6 Click Score: 19    End of Session Equipment Utilized During Treatment: Gait belt Activity Tolerance: Patient limited by fatigue Patient left: in chair;with call bell/phone within reach;with family/visitor present Nurse Communication: Mobility status PT Visit Diagnosis: Muscle weakness (generalized) (M62.81)     Time: 0211-1552 PT Time Calculation (min) (ACUTE ONLY): 21 min  Charges:  $Gait Training: 8-22 mins                     Stacie Glaze, PT Acute Rehabilitation Services Pager [phone]  Office [phone]   Roxine Caddy E Ruffin Pyo 11/22/2020, 5:06 PM

## 2020-11-22 NOTE — Progress Notes (Signed)
Subjective: CC: Notes reviewed overnight. Patient had increased work of breathing overnight, tachycardic then bradycardic. RR RN was called. Made NPO. Xrays obtained that showed no obstructive patten. It did show a PNA on the L.   Patient reports that he has been having hiccups and burping/belching since yesterday morning. He is currently npo. He denies abdominal pain, n/v. Continues to pass flatus. He had a small, loose bm in bed this morning.   Objective: Vital signs in last 24 hours: Temp:  [97.7 F (36.5 C)-99.2 F (37.3 C)] 98.2 F (36.8 C) (03/21 0628) Pulse Rate:  [91-113] 93 (03/21 0628) Resp:  [17-28] 22 (03/21 0628) BP: (131-167)/(69-98) 167/80 (03/21 0628) SpO2:  [97 %-100 %] 98 % (03/21 0628) Weight:  [78.2 kg] 78.2 kg (03/21 0349) Last BM Date: 11/21/20  Intake/Output from previous day: 03/20 0701 - 03/21 0700 In: 975.9 [I.V.:590.7; IV Piggyback:385.3] Out: 800 [Urine:800] Intake/Output this shift: No intake/output data recorded.  PE: Gen: Alert, NAD, pleasant Card: Tachycardic  Pulm:Tachypnea. Decreased breath sounds with questionable crackles at the bases b/l Abd: Soft,mild distension, NT,+bowel sounds, ventral hernia reducible when patient lied nearly flat in chair.  Psych: A&Ox3 Skin: no rashes noted, warm and dry Msk: BL UE edema noted. No LE edema  Lab Results:  Recent Labs    11/22/20 0131  WBC 15.6*  HGB 10.7*  HCT 33.5*  PLT 209   BMET Recent Labs    11/21/20 0054 11/22/20 0131  NA 139 140  K 3.5 3.5  CL 104 107  CO2 26 27  GLUCOSE 156* 136*  BUN 23 21  CREATININE 1.56* 1.47*  CALCIUM 7.9* 8.0*   PT/INR No results for input(s): LABPROT, INR in the last 72 hours. CMP     Component Value Date/Time   NA 140 11/22/2020 0131   K 3.5 11/22/2020 0131   CL 107 11/22/2020 0131   CO2 27 11/22/2020 0131   GLUCOSE 136 (H) 11/22/2020 0131   BUN 21 11/22/2020 0131   CREATININE 1.47 (H) 11/22/2020 0131   CALCIUM 8.0 (L)  11/22/2020 0131   PROT 5.9 (L) 11/22/2020 0131   ALBUMIN 2.5 (L) 11/22/2020 0131   AST 31 11/22/2020 0131   ALT 31 11/22/2020 0131   ALKPHOS 75 11/22/2020 0131   BILITOT 0.8 11/22/2020 0131   GFRNONAA 47 (L) 11/22/2020 0131   GFRAA >60 07/23/2015 0712   Lipase     Component Value Date/Time   LIPASE 23 07/20/2015 1919       Studies/Results: DG Abd 1 View  Result Date: 11/20/2020 CLINICAL DATA:  Nasogastric tube placement EXAM: ABDOMEN - 1 VIEW COMPARISON:  11/20/2020 at 6:34 a.m. FINDINGS: The nasogastric tube is been advanced and its tip is in the stomach antrum region with side port in the stomach body. Contrast medium noted in the colon. IMPRESSION: 1. Nasogastric tube tip is in the stomach antrum region with side port in the stomach body. Electronically Signed   By: Van Clines M.D.   On: 11/20/2020 16:08   DG ABD ACUTE 2+V W 1V CHEST  Result Date: 11/21/2020 CLINICAL DATA:  Free air, shortness of breath EXAM: DG ABDOMEN ACUTE WITH 1 VIEW CHEST COMPARISON:  08/22/2021 FINDINGS: No visible free air. No organomegaly or suspicious calcification. Nonobstructive bowel gas pattern. Prior cholecystectomy. Airspace opacity throughout the left mid and lower lung concerning for pneumonia. No confluent opacity on the right. No effusions. Heart is upper limits normal in size. IMPRESSION: No evidence of  bowel obstruction or free air. Airspace opacity throughout the left mid and lower lung concerning for pneumonia. Electronically Signed   By: Rolm Baptise M.D.   On: 11/21/2020 21:16    Anti-infectives: Anti-infectives (From admission, onward)   Start     Dose/Rate Route Frequency Ordered Stop   11/20/20 1200  piperacillin-tazobactam (ZOSYN) IVPB 3.375 g  Status:  Discontinued        3.375 g 12.5 mL/hr over 240 Minutes Intravenous Every 8 hours 11/20/20 1034 11/21/20 0832   11/19/20 1100  vancomycin (VANCOREADY) IVPB 500 mg/100 mL  Status:  Discontinued        500 mg 100 mL/hr over 60  Minutes Intravenous Every 24 hours 11/18/20 1120 11/19/20 1755   11/18/20 1000  vancomycin (VANCOREADY) IVPB 1500 mg/300 mL        1,500 mg 150 mL/hr over 120 Minutes Intravenous Every 48 hours 11/17/20 0845 11/18/20 1145   11/17/20 1200  piperacillin-tazobactam (ZOSYN) IVPB 3.375 g  Status:  Discontinued        3.375 g 12.5 mL/hr over 240 Minutes Intravenous Every 8 hours 11/17/20 0651 11/20/20 1034   11/17/20 0445  vancomycin (VANCOCIN) IVPB 1000 mg/200 mL premix        1,000 mg 200 mL/hr over 60 Minutes Intravenous  Once 11/17/20 0439 11/17/20 0727   11/17/20 0445  piperacillin-tazobactam (ZOSYN) IVPB 3.375 g        3.375 g 100 mL/hr over 30 Minutes Intravenous  Once 11/17/20 0439 11/17/20 0555       Assessment/Plan Elevated tn - per cards, felt to be demand ischemia PNA on CXR - will defer abx per TRH. WBC 15.6 Hx HTN Hx of GERD Lactic acidosis - resolved  SBO Small ventral hernia-reducible. No incarceration noted on CT.  - Hx ofLap chole(2003 - Dr. 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 Zeigler-2013) -Made NPO after increased work of breathing yesterday. Found to have PNA on CXR. Abd xray with no evidence of bowel obstruction or free air. Previously Xray with contrast in the colon. He tolerated 24 hours of ngt clamping before ngt was removed. He is NT on exam with good bowel sounds. He continues to pass flatus and have loose stool but does have a good bit of burping/belching and hiccups. Given concern for aspiration pna on presentation and pna seen on xray overnight, will leave npo for now and discuss with md. No indication for emergency surgery.  -Mobilize for bowel function - PT rec HH - Keep K > 4 and Mg > 2 for bowel function - We will following along closely.  FEN -NPO, IVF per TRH VTE -SCDs,lovenox ID -None currently   LOS: 5 days    Jillyn Ledger , [facility] Surgery 11/22/2020, 9:32 AM Please see Amion  for pager number during day hours 7:00am-4:30pm

## 2020-11-22 NOTE — Progress Notes (Signed)
Pharmacy Antibiotic Note  Victor Hunter is a 87 y.o. male admitted on 11/17/2020 with pneumonia, possible aspiration. Pharmacy has been consulted for Zosyn dosing; previously on abx from 3/16 >> 3/20. Patient had increased WOB overnight, rapid response called. Abd XR shows left mid to lower lung opacity concerning for PNA therefore abx resumed.   Afebrile with Tm 99.2, WBC elevated at 15.6, PCT elevated at 4.61. SCr trending down, currently 1.47.  Plan: Zosyn 3.375 g IV q8h (infused over 4 hr) Monitor cultures, clinical status, renal fxn Narrow abx as able and f/u duration   Height: 5\' 3"  (160 cm) Weight: 78.2 kg (172 lb 6.4 oz) IBW/kg (Calculated) : 56.9  Temp (24hrs), Avg:98.4 F (36.9 C), Min:97.7 F (36.5 C), Max:99.2 F (37.3 C)  Recent Labs  Lab 11/17/20 0203 11/17/20 0504 11/17/20 0914 11/17/20 1227 11/17/20 2055 11/17/20 2359 11/18/20 0841 11/19/20 0324 11/20/20 0043 11/21/20 0054 11/21/20 1921 11/21/20 2205 11/22/20 0131  WBC 11.2*  --   --   --   --  16.6* 15.8* 15.7*  --   --   --   --  15.6*  CREATININE 2.06*  --   --   --   --   --  1.91* 1.77* 1.58* 1.56*  --   --  1.47*  LATICACIDVEN  --    < > 3.8* 2.7* 1.4  --   --   --   --   --  3.1* 1.5  --    < > = values in this interval not displayed.    Estimated Creatinine Clearance: 34.6 mL/min (A) (by C-G formula based on SCr of 1.47 mg/dL (H)).    No Known Allergies  Antimicrobials this admission: Vancomycin 3/16 >> 3/18 Zosyn 3/16 >> 3/20, 3/21 >>  Dose adjustments this admission: 3/17 Vancomycin 1.5g Q48hr > 500mg  Q24hr   Microbiology results: 3/16 BCx: NGTD  3/16 flu/covid neg 3/17 MRSA PCR: neg   Thank you for involving pharmacy in this patient's care.  Renold Genta, PharmD, BCPS Clinical Pharmacist Clinical phone for 11/22/2020 until 3p is [phone] 11/22/2020 10:23 AM  **Pharmacist phone directory can be found on [email or web address] listed under [place]**

## 2020-11-23 DIAGNOSIS — J9601 Acute respiratory failure with hypoxia: Secondary | ICD-10-CM | POA: Diagnosis not present

## 2020-11-23 LAB — COMPREHENSIVE METABOLIC PANEL
ALT: 67 U/L — ABNORMAL HIGH (ref 0–44)
AST: 50 U/L — ABNORMAL HIGH (ref 15–41)
Albumin: 2.4 g/dL — ABNORMAL LOW (ref 3.5–5.0)
Alkaline Phosphatase: 75 U/L (ref 38–126)
Anion gap: 7 (ref 5–15)
BUN: 23 mg/dL (ref 8–23)
CO2: 26 mmol/L (ref 22–32)
Calcium: 7.7 mg/dL — ABNORMAL LOW (ref 8.9–10.3)
Chloride: 103 mmol/L (ref 98–111)
Creatinine, Ser: 1.58 mg/dL — ABNORMAL HIGH (ref 0.61–1.24)
GFR, Estimated: 43 mL/min — ABNORMAL LOW (ref >60)
Glucose, Bld: 139 mg/dL — ABNORMAL HIGH (ref 70–99)
Potassium: 3.3 mmol/L — ABNORMAL LOW (ref 3.5–5.1)
Sodium: 136 mmol/L (ref 135–145)
Total Bilirubin: 0.8 mg/dL (ref 0.3–1.2)
Total Protein: 5.7 g/dL — ABNORMAL LOW (ref 6.5–8.1)

## 2020-11-23 LAB — DIFFERENTIAL
Abs Immature Granulocytes: 0 10*3/uL (ref 0.00–0.07)
Basophils Absolute: 0.1 10*3/uL (ref 0.0–0.1)
Basophils Relative: 1 %
Eosinophils Absolute: 0.2 10*3/uL (ref 0.0–0.5)
Eosinophils Relative: 2 %
Lymphocytes Relative: 18 %
Lymphs Abs: 2.2 10*3/uL (ref 0.7–4.0)
Monocytes Absolute: 0.7 10*3/uL (ref 0.1–1.0)
Monocytes Relative: 6 %
Neutro Abs: 8.8 10*3/uL — ABNORMAL HIGH (ref 1.7–7.7)
Neutrophils Relative %: 73 %
nRBC: 1 /100 WBC — ABNORMAL HIGH

## 2020-11-23 LAB — CBC
HCT: 32.7 % — ABNORMAL LOW (ref 39.0–52.0)
Hemoglobin: 10.5 g/dL — ABNORMAL LOW (ref 13.0–17.0)
MCH: 25.6 pg — ABNORMAL LOW (ref 26.0–34.0)
MCHC: 32.1 g/dL (ref 30.0–36.0)
MCV: 79.8 fL — ABNORMAL LOW (ref 80.0–100.0)
Platelets: 219 10*3/uL (ref 150–400)
RBC: 4.1 MIL/uL — ABNORMAL LOW (ref 4.22–5.81)
RDW: 16.2 % — ABNORMAL HIGH (ref 11.5–15.5)
WBC: 12.1 10*3/uL — ABNORMAL HIGH (ref 4.0–10.5)
nRBC: 0.2 % (ref 0.0–0.2)

## 2020-11-23 LAB — MAGNESIUM: Magnesium: 2.1 mg/dL (ref 1.7–2.4)

## 2020-11-23 LAB — TRIGLYCERIDES: Triglycerides: 161 mg/dL — ABNORMAL HIGH (ref <150)

## 2020-11-23 LAB — PREALBUMIN: Prealbumin: 8.4 mg/dL — ABNORMAL LOW (ref 18–38)

## 2020-11-23 LAB — PHOSPHORUS: Phosphorus: 3.2 mg/dL (ref 2.5–4.6)

## 2020-11-23 MED ORDER — METOCLOPRAMIDE HCL 5 MG/ML IJ SOLN
5.0000 mg | Freq: Once | INTRAMUSCULAR | Status: AC
  Administered 2020-11-23: 5 mg via INTRAVENOUS
  Filled 2020-11-23: qty 2

## 2020-11-23 MED ORDER — ENSURE ENLIVE PO LIQD
237.0000 mL | Freq: Two times a day (BID) | ORAL | Status: DC
  Administered 2020-11-23 – 2020-12-05 (×24): 237 mL via ORAL

## 2020-11-23 MED ORDER — POTASSIUM CHLORIDE 20 MEQ PO PACK
40.0000 meq | PACK | Freq: Once | ORAL | Status: AC
  Administered 2020-11-23: 40 meq via ORAL
  Filled 2020-11-23: qty 2

## 2020-11-23 NOTE — Progress Notes (Signed)
Subjective: CC: Overall doing better. No sob of this morning but on o2. No burping/belching or hiccups this am but just woke up. Denies abdominal pain, n/v. Tolerating cld and taking in adequate amount. Passing flatus. Notes 3 loose BM's yesterday.   Objective: Vital signs in last 24 hours: Temp:  [97.7 F (36.5 C)-98.5 F (36.9 C)] 98.5 F (36.9 C) (03/22 0430) Pulse Rate:  [60-113] 90 (03/22 0831) Resp:  [16-22] 17 (03/22 0831) BP: (122-158)/(59-95) 122/59 (03/22 0831) SpO2:  [96 %-100 %] 100 % (03/22 0831) Last BM Date: 11/21/20  Intake/Output from previous day: 03/21 0701 - 03/22 0700 In: 75.4 [IV Piggyback:75.4] Out: 375 [Urine:375] Intake/Output this shift: No intake/output data recorded.  PE: Gen: Alert, NAD, pleasant Card: Reg  Pulm: Decreased breath sounds with questionable crackles at the bases b/l Abd: Soft,mild distension, NT,+bowel sounds, ventral hernia reducible when patient lied flat in bed but spontaneously reoccurs  Psych: A&Ox3 Skin: no rashes noted, warm and dry Msk: BL UE edema noted. No LE edema  Lab Results:  Recent Labs    11/22/20 0131 11/23/20 0232  WBC 15.6* 12.1*  HGB 10.7* 10.5*  HCT 33.5* 32.7*  PLT 209 219   BMET Recent Labs    11/22/20 0131 11/23/20 0232  NA 140 136  K 3.5 3.3*  CL 107 103  CO2 27 26  GLUCOSE 136* 139*  BUN 21 23  CREATININE 1.47* 1.58*  CALCIUM 8.0* 7.7*   PT/INR No results for input(s): LABPROT, INR in the last 72 hours. CMP     Component Value Date/Time   NA 136 11/23/2020 0232   K 3.3 (L) 11/23/2020 0232   CL 103 11/23/2020 0232   CO2 26 11/23/2020 0232   GLUCOSE 139 (H) 11/23/2020 0232   BUN 23 11/23/2020 0232   CREATININE 1.58 (H) 11/23/2020 0232   CALCIUM 7.7 (L) 11/23/2020 0232   PROT 5.7 (L) 11/23/2020 0232   ALBUMIN 2.4 (L) 11/23/2020 0232   AST 50 (H) 11/23/2020 0232   ALT 67 (H) 11/23/2020 0232   ALKPHOS 75 11/23/2020 0232   BILITOT 0.8 11/23/2020 0232   GFRNONAA 43  (L) 11/23/2020 0232   GFRAA >60 07/23/2015 0712   Lipase     Component Value Date/Time   LIPASE 23 07/20/2015 1919       Studies/Results: DG ABD ACUTE 2+V W 1V CHEST  Result Date: 11/21/2020 CLINICAL DATA:  Free air, shortness of breath EXAM: DG ABDOMEN ACUTE WITH 1 VIEW CHEST COMPARISON:  08/22/2021 FINDINGS: No visible free air. No organomegaly or suspicious calcification. Nonobstructive bowel gas pattern. Prior cholecystectomy. Airspace opacity throughout the left mid and lower lung concerning for pneumonia. No confluent opacity on the right. No effusions. Heart is upper limits normal in size. IMPRESSION: No evidence of bowel obstruction or free air. Airspace opacity throughout the left mid and lower lung concerning for pneumonia. Electronically Signed   By: Rolm Baptise M.D.   On: 11/21/2020 21:16   [place] EKG SITE RITE  Result Date: 11/22/2020 If Site Rite image not attached, placement could not be confirmed due to current cardiac rhythm.   Anti-infectives: Anti-infectives (From admission, onward)   Start     Dose/Rate Route Frequency Ordered Stop   11/22/20 1130  piperacillin-tazobactam (ZOSYN) IVPB 3.375 g        3.375 g 12.5 mL/hr over 240 Minutes Intravenous Every 8 hours 11/22/20 1022     11/20/20 1200  piperacillin-tazobactam (ZOSYN) IVPB 3.375 g  Status:  Discontinued        3.375 g 12.5 mL/hr over 240 Minutes Intravenous Every 8 hours 11/20/20 1034 11/21/20 0832   11/19/20 1100  vancomycin (VANCOREADY) IVPB 500 mg/100 mL  Status:  Discontinued        500 mg 100 mL/hr over 60 Minutes Intravenous Every 24 hours 11/18/20 1120 11/19/20 1755   11/18/20 1000  vancomycin (VANCOREADY) IVPB 1500 mg/300 mL        1,500 mg 150 mL/hr over 120 Minutes Intravenous Every 48 hours 11/17/20 0845 11/18/20 1145   11/17/20 1200  piperacillin-tazobactam (ZOSYN) IVPB 3.375 g  Status:  Discontinued        3.375 g 12.5 mL/hr over 240 Minutes Intravenous Every 8 hours 11/17/20 0651 11/20/20  1034   11/17/20 0445  vancomycin (VANCOCIN) IVPB 1000 mg/200 mL premix        1,000 mg 200 mL/hr over 60 Minutes Intravenous  Once 11/17/20 0439 11/17/20 0727   11/17/20 0445  piperacillin-tazobactam (ZOSYN) IVPB 3.375 g        3.375 g 100 mL/hr over 30 Minutes Intravenous  Once 11/17/20 0439 11/17/20 0555       Assessment/Plan Elevated tn - per cards, felt to be demand ischemia PNA on CXR - abxper TRH Hx HTN Hx of GERD Lactic acidosis - resolved  SBO Small ventral hernia-reducible. No incarceration noted on CT.  - Hx ofLap chole(2003 - Dr. 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 Zeigler-2013) -SBO protocol initiated on admission with contrast in the colon > He tolerated 24 hours of ngt clamping before ngt was removed > Abd xray 3/20 with no evidence of bowel obstruction or free air > He is NT on exam with good bowel sounds. He continues to pass flatus and have loose stool. Burping/belching and hiccups better this morning. He is tolerating cld. Will adv to FLD. Can hold off on TPN from our standpoint unless patient regresses.  - No indication for emergency surgery.  -Mobilize for bowel function - PT rec HH - Keep K > 4 and Mg > 2 for bowel function - We will following along closely.  FEN - FLD, ensure, IVF per TRH, K 3.3 (replace) VTE -SCDs,lovenox ID -Zosyn for PNA Foley - Per TRH Follow-Up - TBD   LOS: 6 days    Jillyn Ledger , [facility] Surgery 11/23/2020, 8:34 AM Please see Amion for pager number during day hours 7:00am-4:30pm

## 2020-11-23 NOTE — Progress Notes (Signed)
Peripherally Inserted Central Catheter Placement  The IV Nurse has discussed with the patient and/or persons authorized to consent for the patient, the purpose of this procedure and the potential benefits and risks involved with this procedure.  The benefits include less needle sticks, lab draws from the catheter, and the patient may be discharged home with the catheter. Risks include, but not limited to, infection, bleeding, blood clot (thrombus formation), and puncture of an artery; nerve damage and irregular heartbeat and possibility to perform a PICC exchange if needed/ordered by physician.  Alternatives to this procedure were also discussed.  Bard Power PICC patient education guide, fact sheet on infection prevention and patient information card has been provided to patient /or left at bedside.    PICC Placement Documentation  PICC Double Lumen 82/95/62 PICC Right Basilic 40 cm 0 cm (Active)  Indication for Insertion or Continuance of Line Administration of hyperosmolar/irritating solutions (i.e. TPN, Vancomycin, etc.) 11/23/20 1244  Exposed Catheter (cm) 0 cm 11/23/20 1244  Site Assessment Clean;Dry;Intact 11/23/20 1244  Lumen #1 Status Flushed;Blood return noted;Saline locked 11/23/20 1244  Lumen #2 Status Flushed;Blood return noted;Saline locked 11/23/20 1244  Dressing Type Transparent 11/23/20 1244  Dressing Status Clean;Dry;Intact 11/23/20 1244  Antimicrobial disc in place? Yes 11/23/20 1244  Dressing Change Due 11/30/20 11/23/20 1244       Scotty Court 11/23/2020, 12:46 PM

## 2020-11-23 NOTE — Plan of Care (Signed)
  Problem: Education: Goal: Knowledge of General Education information will improve Description Including pain rating scale, medication(s)/side effects and non-pharmacologic comfort measures Outcome: Progressing   

## 2020-11-23 NOTE — Progress Notes (Signed)
PHARMACY - TOTAL PARENTERAL NUTRITION CONSULT NOTE     Assessment:  87 yo M with pneumonia and SBO now tolerating CLD, with flatus and bowel movements. Pharmacy consulted to start TPN. PICC line not placed yet. Discussed with Surgery who agrees that patient appears to be improving and would like to advance to FLD today. TRH agrees with holding TPN today.     Current Nutrition:  Clear liquids> FLD  Plan:  Hold TPN, monitor PO intake and ADAT   Benetta Spar, PharmD, BCPS, BCCP Clinical Pharmacist  Please check AMION for all [place] phone numbers After 10:00 PM, call [place] [phone]

## 2020-11-23 NOTE — Progress Notes (Signed)
Physical Therapy Treatment Patient Details Name: Victor Hunter MRN: [id number] DOB: 06-20-1934 Today's Date: 11/23/2020    History of Present Illness Pt is an 87 y/o male admitted 3/15 for acute respiratory failure from PNA and SBO. Pt had NG tube upon presentation and it was taken out on 3/20. PMH includes SBO, HTN, and CAD.    PT Comments    Pt resting upon PT arrival to room, requires mod encouragement but agreeable to OOB mobility. Pt ambulatory in hallway with close guard for safety, requiring a combination of seated and standing rest breaks to recover dyspnea on exertion. Pt requires cues for safety and breathing technique throughout mobility, and up to mod assist for bed mobility. Pt performed repeated sit<>stands well today, but is limited overall by fatigue and dyspnea. Spo2 88-94% on RA during mobility, RN notified. PT to continue to follow acutely.    Follow Up Recommendations  Home health PT     Equipment Recommendations  None recommended by PT    Recommendations for Other Services       Precautions / Restrictions Precautions Precautions: Fall Precaution Comments: sats Restrictions Weight Bearing Restrictions: No    Mobility  Bed Mobility Overal bed mobility: Needs Assistance Bed Mobility: Supine to Sit     Supine to sit: Min assist     General bed mobility comments: mod assist for LE translation to EOB, trunk elevation, and scooting to EOB with use of bed pad. Very increased time, verbal cuing for sequencing task.    Transfers Overall transfer level: Needs assistance Equipment used: Rolling walker (2 wheeled) Transfers: Sit to/from Stand Sit to Stand: Min guard         General transfer comment: for safety, verbal cuing for hand placement when rsing/sitting. STS x7, 1 from EOB, 6 from bench in hallway as LE strengthening intervention.  Ambulation/Gait Ambulation/Gait assistance: Min guard Gait Distance (Feet): 70 Feet (x2) Assistive device:  Rolling walker (2 wheeled) Gait Pattern/deviations: Step-through pattern;Decreased stride length;Trunk flexed Gait velocity: decr   General Gait Details: min guard for safety, verbal cuing for upright posture, placement in RW, standing rest breaks as needed to recover DOE 2/4, and breathing technique. Standing rest break x1, seated rest break x2 with 2 minute rest and cues for pursed lip breathing technique.   Stairs             Wheelchair Mobility    Modified Rankin (Stroke Patients Only)       Balance Overall balance assessment: Needs assistance Sitting-balance support: Feet supported Sitting balance-Leahy Scale: Good     Standing balance support: Bilateral upper extremity supported;No upper extremity supported Standing balance-Leahy Scale: Fair                              Cognition Arousal/Alertness: Awake/alert Behavior During Therapy: WFL for tasks assessed/performed Overall Cognitive Status: Impaired/Different from baseline Area of Impairment: Safety/judgement;Awareness;Problem solving                         Safety/Judgement: Decreased awareness of safety;Decreased awareness of deficits Awareness: Intellectual Problem Solving: Slow processing;Decreased initiation;Difficulty sequencing;Requires verbal cues;Requires tactile cues General Comments: pt soiled in stool upon arrival, with no awareness of this. Requires cues for mobility, and very increased time.      Exercises Other Exercises Other Exercises: STS x5, repeated in succession, to promote LE strengthening and CV conditioning    General Comments General comments (  skin integrity, edema, etc.): SpO2 88-94% on RA, HR 75-128 bpm. x1 episode of gasping and abdominal pain, passed within 5 seconds      Pertinent Vitals/Pain Pain Assessment: Faces Faces Pain Scale: Hurts little more Pain Location: abdomen - indigestion, hiccups Pain Descriptors / Indicators:  Discomfort;Grimacing Pain Intervention(s): Limited activity within patient's tolerance;Monitored during session;Repositioned    Home Living                      Prior Function            PT Goals (current goals can now be found in the care plan section) Acute Rehab PT Goals Patient Stated Goal: return home and go back to work PT Goal Formulation: With patient Time For Goal Achievement: 12/03/20 Potential to Achieve Goals: Good Progress towards PT goals: Progressing toward goals    Frequency    Min 3X/week      PT Plan Current plan remains appropriate    Co-evaluation              AM-PAC PT "6 Clicks" Mobility   Outcome Measure  Help needed turning from your back to your side while in a flat bed without using bedrails?: None Help needed moving from lying on your back to sitting on the side of a flat bed without using bedrails?: A Little Help needed moving to and from a bed to a chair (including a wheelchair)?: A Little Help needed standing up from a chair using your arms (e.g., wheelchair or bedside chair)?: A Little Help needed to walk in hospital room?: A Little Help needed climbing 3-5 steps with a railing? : A Little 6 Click Score: 19    End of Session   Activity Tolerance: Patient limited by fatigue Patient left: in chair;with call bell/phone within reach;with family/visitor present;with chair alarm set Nurse Communication: Mobility status PT Visit Diagnosis: Muscle weakness (generalized) (M62.81)     Time: 4665-9935 PT Time Calculation (min) (ACUTE ONLY): 25 min  Charges:  $Gait Training: 8-22 mins $Therapeutic Activity: 8-22 mins                     Stacie Glaze, PT Acute Rehabilitation Services Pager [phone]  Office [phone]    Roxine Caddy E Ruffin Pyo 11/23/2020, 12:22 PM

## 2020-11-23 NOTE — Progress Notes (Signed)
PROGRESS NOTE   Victor Hunter  TDV:[id number] DOB: 10-Jul-1934 DOA: 11/17/2020 PCP: Rosita Fire, MD  Brief Narrative:   87 year old black male community dwelling BPH + epididymitis 2016 status post TURP 2008 Mallory-Weiss tear 2005+ readmission 2013 cryptogenic anemia-was supposed to have elective hernia repair?  And was to follow-up with Dr. Buford Dresser in [place] at the time Prior RUE DVT 04/2012 Hypothyroid  Prior SBO 04/22/2012 = adynamic ileus??-Intubated/ventilated-had septic shock at the time  Admit to [facility] 3/16 4 days abdominal distention constipation obstipation, increased WOB O2 sats 78% on arrival of EMS 15 L Hypotensive 100/60 BUNs/creatinine 33/2.0 (baseline 1.4 Troponin peak 140 4200 Lactic acid 5  CT abdomen pelvis = SBO + distal jejunal transition point?  Airspace disease on CT NG tube placed 800 cc coffee-ground/negative Gastroccult Rx heparin plus ASA (2/2 ? troponin) Dr. Constance Haw General surgery recommended transfer-->[facility]--> general surgery evaluated  passing flatus Gastrografin X2-General surgery consulted and seen- Treating for pneumonia and narrowing to Zosyn and extended to 14 days therapy  NG tube pulled 3/20 however patient has been having obstipation and hiccups valve and is no longer passing stool  We extended his duration of antibiotics to treat probable aspiration pneumonia and he will need a total of 14 days of antibiotics on discharge ending on 11/29/2020  Hospital-Problem based course  SBO in the setting of prior adynamic ileus 2013 Defer to gen surgery next steps TPN as per general surgery if input not adequate Continuing and extending Zosyn--source at this time is pneumonia given response with antibiotics severe sepsis on admission Aspiration pneumonia from prior to admission-stop date antibiotics 328 CXR 3/17-repeat abdominal series confirms persisting atelectasis versus pneumonia [which is expected] DC vancomycin continue Zosyn-initially  stopped on 3/19 but resumed 3/21 after events of 3/20 Leukocytosis improving slowly Severe hiccups Continue Thorazine at increased dose from 3/21 Watch for sedation/encephalopathy as I am also adding Reglan on 3/22 to ensure that he has no nausea NSTEMI  Cardiology d/c on 3/19 heparin  Echocardiogram 3/16 no wall motion abnormalities suspect related to stress of SBO?  Sepsis  Lisinopril 20, HCTZ, Cardizem 180 on hold at this Time  As needed metoprolol IV for blood pressure control Prior right upper extremity DVT 2013  No longer on anticoagulation  May benefit from ASA  Discussion as outpatient  Hypothyroid  Replace Synthroid IV half usual dose 56 Hypokalemia  Stopped IV fluid as becoming plethoric  Replace potassium orally with K-Lor 40 packet   DVT prophylaxis: SCD at this time Code Status: Full Family Communication: Discussed with daughter Mardene Celeste at the bedside and expect that the patient will need several days more in the hospital to improve Disposition:  Status is: Inpatient  Remains inpatient appropriate because:Persistent severe electrolyte disturbances, Altered mental status and IV treatments appropriate due to intensity of illness or inability to take PO   Dispo: The patient is from: Home              Anticipated d/c is to: Home with home health PT              Patient currently is not medically stable to d/c.-Patient needs to be eating almost a full diet prior to discharge and tolerating the same He will need oxygen on discharge and home health PT on discharge   Difficult to place patient No    Consultants:   General surgery  Cardiology  Procedures: Multiple CTs  Antimicrobials: Vancomycin DC 3/18-continuing Zosyn   Subjective:  Continues to have flatus  No chest pain fever chills nausea vomiting at this time In the room he had an episode of paroxysmal abdominal discomfort which seemed like a hiccup-he states that he has tenderness in the left lower  quadrant Overall he seems improved from yesterday  Objective: Vitals:   11/22/20 2248 11/23/20 0030 11/23/20 0430 11/23/20 0831  BP: 139/81 125/73 124/66 (!) 122/59  Pulse: 96 60 70 90  Resp: (!) 22 20 16 17   Temp: 98.5 F (36.9 C) 97.7 F (36.5 C) 98.5 F (36.9 C)   TempSrc: Oral Axillary Axillary   SpO2: 99% 100% 100% 100%  Weight:      Height:        Intake/Output Summary (Last 24 hours) at 11/23/2020 1322 Last data filed at 11/23/2020 5329 Gross per 24 hour  Intake 75.43 ml  Output 375 ml  Net -299.57 ml   Filed Weights   11/17/20 2230 11/20/20 1130 11/22/20 0349  Weight: 85 kg 79.9 kg 78.2 kg    Examination:  Coherent pleasant African-American male Mallampati 4 CTA B no added sound rales rhonchi Distended abdomen supraumbilical hernia? Slightly tender left side No CVA tenderness No lower extremity edema He does have some swelling to his hands   Data Reviewed: personally reviewed   Data Potassium 3.3 BUNs/creatinine 23/1.5 AST/ALT 50/67 Prealbumin 8.4 WBC 15.6-->12.1 Hemoglobin 10.5 MCV 79 platelet 219 Lactic acid 3.8-->1.4 Blood culture 3/16 no growth to date  CBC    Component Value Date/Time   WBC 12.1 (H) 11/23/2020 0232   RBC 4.10 (L) 11/23/2020 0232   HGB 10.5 (L) 11/23/2020 0232   HCT 32.7 (L) 11/23/2020 0232   PLT 219 11/23/2020 0232   MCV 79.8 (L) 11/23/2020 0232   MCH 25.6 (L) 11/23/2020 0232   MCHC 32.1 11/23/2020 0232   RDW 16.2 (H) 11/23/2020 0232   LYMPHSABS 2.2 11/23/2020 0232   MONOABS 0.7 11/23/2020 0232   EOSABS 0.2 11/23/2020 0232   BASOSABS 0.1 11/23/2020 0232   CMP Latest Ref Rng & Units 11/23/2020 11/22/2020 11/21/2020  Glucose 70 - 99 mg/dL 139(H) 136(H) 156(H)  BUN 8 - 23 mg/dL 23 21 23   Creatinine 0.61 - 1.24 mg/dL 1.58(H) 1.47(H) 1.56(H)  Sodium 135 - 145 mmol/L 136 140 139  Potassium 3.5 - 5.1 mmol/L 3.3(L) 3.5 3.5  Chloride 98 - 111 mmol/L 103 107 104  CO2 22 - 32 mmol/L 26 27 26   Calcium 8.9 - 10.3 mg/dL 7.7(L)  8.0(L) 7.9(L)  Total Protein 6.5 - 8.1 g/dL 5.7(L) 5.9(L) 6.0(L)  Total Bilirubin 0.3 - 1.2 mg/dL 0.8 0.8 1.2  Alkaline Phos 38 - 126 U/L 75 75 77  AST 15 - 41 U/L 50(H) 31 23  ALT 0 - 44 U/L 67(H) 31 21     Radiology Studies: DG ABD ACUTE 2+V W 1V CHEST  Result Date: 11/21/2020 CLINICAL DATA:  Free air, shortness of breath EXAM: DG ABDOMEN ACUTE WITH 1 VIEW CHEST COMPARISON:  08/22/2021 FINDINGS: No visible free air. No organomegaly or suspicious calcification. Nonobstructive bowel gas pattern. Prior cholecystectomy. Airspace opacity throughout the left mid and lower lung concerning for pneumonia. No confluent opacity on the right. No effusions. Heart is upper limits normal in size. IMPRESSION: No evidence of bowel obstruction or free air. Airspace opacity throughout the left mid and lower lung concerning for pneumonia. Electronically Signed   By: Rolm Baptise M.D.   On: 11/21/2020 21:16   [place] EKG SITE RITE  Result Date: 11/22/2020 If [facility] image not attached, placement  could not be confirmed due to current cardiac rhythm.    Scheduled Meds: . Chlorhexidine Gluconate Cloth  6 each Topical Daily  . enoxaparin (LOVENOX) injection  40 mg Subcutaneous Daily  . feeding supplement  237 mL Oral BID BM  . hydrALAZINE  20 mg Intravenous TID  . hydrochlorothiazide  25 mg Oral Daily  . levothyroxine  56 mcg Intravenous Daily  . pantoprazole (PROTONIX) IV  40 mg Intravenous Q12H   Or  . pantoprazole  40 mg Oral Q12H   Continuous Infusions: . chlorproMAZINE (THORAZINE) IV 25 mg (11/23/20 6859)  . piperacillin-tazobactam (ZOSYN)  IV 3.375 g (11/23/20 1154)     LOS: 6 days   Time spent: 30 minutes  Nita Sells, MD [facility] To contact the attending provider between 7A-7P or the covering provider during after hours 7P-7A, please log into the web site [email or web address] and access using universal [place] password for that web site. If you do not have the password, please  call the hospital operator.  11/23/2020, 1:22 PM

## 2020-11-24 ENCOUNTER — Inpatient Hospital Stay (HOSPITAL_COMMUNITY): Payer: Medicare PPO

## 2020-11-24 DIAGNOSIS — J9601 Acute respiratory failure with hypoxia: Secondary | ICD-10-CM | POA: Diagnosis not present

## 2020-11-24 LAB — CBC WITH DIFFERENTIAL/PLATELET
Abs Immature Granulocytes: 0 10*3/uL (ref 0.00–0.07)
Basophils Absolute: 0.2 10*3/uL — ABNORMAL HIGH (ref 0.0–0.1)
Basophils Relative: 2 %
Eosinophils Absolute: 0.4 10*3/uL (ref 0.0–0.5)
Eosinophils Relative: 4 %
HCT: 31.3 % — ABNORMAL LOW (ref 39.0–52.0)
Hemoglobin: 10.1 g/dL — ABNORMAL LOW (ref 13.0–17.0)
Lymphocytes Relative: 22 %
Lymphs Abs: 2.1 10*3/uL (ref 0.7–4.0)
MCH: 25.9 pg — ABNORMAL LOW (ref 26.0–34.0)
MCHC: 32.3 g/dL (ref 30.0–36.0)
MCV: 80.3 fL (ref 80.0–100.0)
Monocytes Absolute: 0.5 10*3/uL (ref 0.1–1.0)
Monocytes Relative: 5 %
Neutro Abs: 6.3 10*3/uL (ref 1.7–7.7)
Neutrophils Relative %: 67 %
Platelets: 240 10*3/uL (ref 150–400)
RBC: 3.9 MIL/uL — ABNORMAL LOW (ref 4.22–5.81)
RDW: 16.4 % — ABNORMAL HIGH (ref 11.5–15.5)
WBC: 9.4 10*3/uL (ref 4.0–10.5)
nRBC: 0 % (ref 0.0–0.2)
nRBC: 0 /100 WBC

## 2020-11-24 LAB — COMPREHENSIVE METABOLIC PANEL WITH GFR
ALT: 87 U/L — ABNORMAL HIGH (ref 0–44)
AST: 51 U/L — ABNORMAL HIGH (ref 15–41)
Albumin: 2.2 g/dL — ABNORMAL LOW (ref 3.5–5.0)
Alkaline Phosphatase: 76 U/L (ref 38–126)
Anion gap: 8 (ref 5–15)
BUN: 23 mg/dL (ref 8–23)
CO2: 27 mmol/L (ref 22–32)
Calcium: 7.6 mg/dL — ABNORMAL LOW (ref 8.9–10.3)
Chloride: 104 mmol/L (ref 98–111)
Creatinine, Ser: 2.06 mg/dL — ABNORMAL HIGH (ref 0.61–1.24)
GFR, Estimated: 31 mL/min — ABNORMAL LOW
Glucose, Bld: 139 mg/dL — ABNORMAL HIGH (ref 70–99)
Potassium: 3 mmol/L — ABNORMAL LOW (ref 3.5–5.1)
Sodium: 139 mmol/L (ref 135–145)
Total Bilirubin: 0.4 mg/dL (ref 0.3–1.2)
Total Protein: 5.4 g/dL — ABNORMAL LOW (ref 6.5–8.1)

## 2020-11-24 LAB — MAGNESIUM: Magnesium: 2.2 mg/dL (ref 1.7–2.4)

## 2020-11-24 MED ORDER — SODIUM CHLORIDE 0.9% FLUSH: 10.0000 mL | INTRAVENOUS | Status: DC | PRN

## 2020-11-24 MED ORDER — DILTIAZEM HCL ER COATED BEADS 180 MG PO CP24
180.0000 mg | ORAL_CAPSULE | Freq: Every day | ORAL | Status: DC
  Administered 2020-11-24 – 2020-12-05 (×12): 180 mg via ORAL
  Filled 2020-11-24 (×12): qty 1

## 2020-11-24 MED ORDER — POTASSIUM CHLORIDE 20 MEQ PO PACK
40.0000 meq | PACK | Freq: Two times a day (BID) | ORAL | Status: DC
  Administered 2020-11-24: 40 meq via ORAL
  Filled 2020-11-24: qty 2

## 2020-11-24 MED ORDER — LEVOTHYROXINE SODIUM 112 MCG PO TABS
112.0000 ug | ORAL_TABLET | Freq: Every day | ORAL | Status: DC
  Administered 2020-11-24 – 2020-12-05 (×13): 112 ug via ORAL
  Filled 2020-11-24 (×12): qty 1

## 2020-11-24 MED ORDER — POTASSIUM CHLORIDE 20 MEQ PO PACK
40.0000 meq | PACK | Freq: Once | ORAL | Status: AC
  Administered 2020-11-24: 40 meq via ORAL
  Filled 2020-11-24: qty 2

## 2020-11-24 NOTE — Progress Notes (Addendum)
Subjective: CC: Family at bedside. Patients burping/belching/hiccups less frequent. Did not witness any when I was talking to him this morning. He is tolerating fld and finishing all of his trays without abdominal pain, n/v. Continues to pass flatus and he reports increased passage of flatus in the last 24 hours. Per patient, he had a liquid bm yesterday.   Objective: Vital signs in last 24 hours: Temp:  [97.7 F (36.5 C)-98.8 F (37.1 C)] 98.8 F (37.1 C) (03/23 0359) Pulse Rate:  [69-91] 69 (03/23 0359) Resp:  [18] 18 (03/23 0359) BP: (119-137)/(56-64) 129/56 (03/23 0359) SpO2:  [94 %-99 %] 94 % (03/23 0359) Last BM Date: 11/22/20 - liquid bm per patient yesterday   Intake/Output from previous day: 03/22 0701 - 03/23 0700 In: 418.6 [P.O.:280; IV Piggyback:138.6] Out: 350 [Urine:350] Intake/Output this shift: No intake/output data recorded.  PE: Gen: Alert, NAD, pleasant Card: Reg Pulm: Normal rate and effort  Abd: Soft, stable distension, NT,+bowel sounds, ventral hernia reducible when patient liedflat in bed but spontaneously reoccurs  Psych: A&Ox3 Skin: no rashes noted, warm and dry Msk: BL UE edema noted. No LE edema  Lab Results:  Recent Labs    11/23/20 0232 11/24/20 0350  WBC 12.1* 9.4  HGB 10.5* 10.1*  HCT 32.7* 31.3*  PLT 219 240   BMET Recent Labs    11/23/20 0232 11/24/20 0350  NA 136 139  K 3.3* 3.0*  CL 103 104  CO2 26 27  GLUCOSE 139* 139*  BUN 23 23  CREATININE 1.58* 2.06*  CALCIUM 7.7* 7.6*   PT/INR No results for input(s): LABPROT, INR in the last 72 hours. CMP     Component Value Date/Time   NA 139 11/24/2020 0350   K 3.0 (L) 11/24/2020 0350   CL 104 11/24/2020 0350   CO2 27 11/24/2020 0350   GLUCOSE 139 (H) 11/24/2020 0350   BUN 23 11/24/2020 0350   CREATININE 2.06 (H) 11/24/2020 0350   CALCIUM 7.6 (L) 11/24/2020 0350   PROT 5.4 (L) 11/24/2020 0350   ALBUMIN 2.2 (L) 11/24/2020 0350   AST 51 (H) 11/24/2020 0350    ALT 87 (H) 11/24/2020 0350   ALKPHOS 76 11/24/2020 0350   BILITOT 0.4 11/24/2020 0350   GFRNONAA 31 (L) 11/24/2020 0350   GFRAA >60 07/23/2015 0712   Lipase     Component Value Date/Time   LIPASE 23 07/20/2015 1919       Studies/Results: [place] EKG SITE RITE  Result Date: 11/22/2020 If Site Rite image not attached, placement could not be confirmed due to current cardiac rhythm.   Anti-infectives: Anti-infectives (From admission, onward)   Start     Dose/Rate Route Frequency Ordered Stop   11/22/20 1130  piperacillin-tazobactam (ZOSYN) IVPB 3.375 g        3.375 g 12.5 mL/hr over 240 Minutes Intravenous Every 8 hours 11/22/20 1022 11/29/20 2359   11/20/20 1200  piperacillin-tazobactam (ZOSYN) IVPB 3.375 g  Status:  Discontinued        3.375 g 12.5 mL/hr over 240 Minutes Intravenous Every 8 hours 11/20/20 1034 11/21/20 0832   11/19/20 1100  vancomycin (VANCOREADY) IVPB 500 mg/100 mL  Status:  Discontinued        500 mg 100 mL/hr over 60 Minutes Intravenous Every 24 hours 11/18/20 1120 11/19/20 1755   11/18/20 1000  vancomycin (VANCOREADY) IVPB 1500 mg/300 mL        1,500 mg 150 mL/hr over 120 Minutes Intravenous Every 48  hours 11/17/20 0845 11/18/20 1145   11/17/20 1200  piperacillin-tazobactam (ZOSYN) IVPB 3.375 g  Status:  Discontinued        3.375 g 12.5 mL/hr over 240 Minutes Intravenous Every 8 hours 11/17/20 0651 11/20/20 1034   11/17/20 0445  vancomycin (VANCOCIN) IVPB 1000 mg/200 mL premix        1,000 mg 200 mL/hr over 60 Minutes Intravenous  Once 11/17/20 0439 11/17/20 0727   11/17/20 0445  piperacillin-tazobactam (ZOSYN) IVPB 3.375 g        3.375 g 100 mL/hr over 30 Minutes Intravenous  Once 11/17/20 0439 11/17/20 0555       Assessment/Plan Elevated tn - per cards, felt to be demand ischemia PNA on CXR-abxper TRH Hx HTN Hx of GERD Elevated Cr - Cr 2.06 from 1.58  Lactic acidosis - resolved - Per TRH -   SBO Small ventral hernia-reducible. No  incarceration noted on CT.  - Hx ofLap chole(2003 - Dr. 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 Zeigler-2013) -SBO protocol initiated on admission with contrast in the colon > He tolerated 24 hours of ngt clamping before ngt was removed > Abd xray 3/20 with no evidence of bowel obstruction or free air > He is NT on exam with good bowel sounds.Hecontinues to pass flatus and haveloose stool. Burping/belching and hiccups continue to improve this morning. He is tolerating FLD. Will adv to Soft diet.  - No indication for emergency surgery. -Mobilize for bowel function - PT rec HH - Keep K > 4 and Mg > 2 for bowel function - We will following along closely.  FEN - Soft, ensure,IVF per TRH, K 3.0 (replace) VTE -SCDs,lovenox ID -Zosyn for PNA Foley - Per TRH Follow-Up - TBD   LOS: 7 days    Jillyn Ledger , [facility] Surgery 11/24/2020, 8:37 AM Please see Amion for pager number during day hours 7:00am-4:30pm

## 2020-11-24 NOTE — Progress Notes (Signed)
PROGRESS NOTE    Victor Hunter  SKA:[id number] DOB: 1934/01/19 DOA: 11/17/2020 PCP: Rosita Fire, MD     Brief Narrative:  Victor Hunter is an 87 y.o. male with medical history significant for hypertension, esophagitis, BPH who presented to the hospital with chief complaint of shortness of breath, constipation, abdominal distention.  On arrival of EMS, patient was noted to be hypoxic with oxygen saturation of 78% on room air, required up to 15 L high flow nasal cannula O2 on arrival to the emergency department.  Work-up revealed small bowel obstruction with transition in the distal jejunum, bibasilar airspace disease greatest in the left lower lobe.  NG tube was placed, general surgery consulted for SBO.  Patient was also started on IV antibiotic for possible aspiration pneumonia.  New events last 24 hours / Subjective: Patient seen with daughter at bedside.  Tolerated full liquid diet this morning without nausea or vomiting.  Denies any abdominal pain.  Had a bowel movement yesterday, passing gas this morning.  Complains of generalized edema.  Assessment & Plan:   Principal Problem:   Acute respiratory failure with hypoxia (HCC) Active Problems:   Aspiration pneumonia (HCC)   Hypokalemia   AKI (acute kidney injury) ([place])   Leukocytosis   Elevated troponin   Hypothyroidism   Sepsis (HCC)   Gastroesophageal reflux disease with hiatal hernia   BPH (benign prostatic hyperplasia)   Essential hypertension   Small bowel obstruction (HCC)   Flash pulmonary edema (HCC)   Elevated brain natriuretic peptide (BNP) level   Elevated d-dimer   SBO -Improving, advancing diet today -General surgery following  AKI on CKD stage IIIb -Baseline creatinine 1.5 -Hold lisinopril thiazide -Renal ultrasound showed small left renal cyst without focal abnormality noted  Aspiration pneumonia -Continue IV Zosyn, plan to transition to Augmentin for discharge -Leukocytosis  resolved  Hypertension -Lisinopril, thiazide on hold due to AKI, amlodipine on hold due to edema -Resume Cardizem -Blood pressure stable  Severe hiccups -Continue Thorazine as needed  Demand ischemia -Cardiology signed off 3/19 -Echocardiogram without wall motion abnormality, no ischemic work-up necessary.  IV heparin was discontinued  History of RUE DVT in 2013 -No longer on anticoagulation  Hypothyroidism -Continue Synthroid  Hypokalemia -Replace, trend  DVT prophylaxis:  enoxaparin (LOVENOX) injection 40 mg Start: 11/23/20 1000 SCDs Start: 11/17/20 0538  Code Status: Full code Family Communication: Daughter at bedside Disposition Plan:  Status is: Inpatient  Remains inpatient appropriate because:IV treatments appropriate due to intensity of illness or inability to take PO   Dispo: The patient is from: Home              Anticipated d/c is to: Home              Patient currently is not medically stable to d/c.  Advance diet today   Difficult to place patient No  Consultants:   General surgery  Cardiology  Antimicrobials:  Anti-infectives (From admission, onward)   Start     Dose/Rate Route Frequency Ordered Stop   11/22/20 1130  piperacillin-tazobactam (ZOSYN) IVPB 3.375 g        3.375 g 12.5 mL/hr over 240 Minutes Intravenous Every 8 hours 11/22/20 1022 11/29/20 2359   11/20/20 1200  piperacillin-tazobactam (ZOSYN) IVPB 3.375 g  Status:  Discontinued        3.375 g 12.5 mL/hr over 240 Minutes Intravenous Every 8 hours 11/20/20 1034 11/21/20 0832   11/19/20 1100  vancomycin (VANCOREADY) IVPB 500 mg/100 mL  Status:  Discontinued        500 mg 100 mL/hr over 60 Minutes Intravenous Every 24 hours 11/18/20 1120 11/19/20 1755   11/18/20 1000  vancomycin (VANCOREADY) IVPB 1500 mg/300 mL        1,500 mg 150 mL/hr over 120 Minutes Intravenous Every 48 hours 11/17/20 0845 11/18/20 1145   11/17/20 1200  piperacillin-tazobactam (ZOSYN) IVPB 3.375 g  Status:   Discontinued        3.375 g 12.5 mL/hr over 240 Minutes Intravenous Every 8 hours 11/17/20 0651 11/20/20 1034   11/17/20 0445  vancomycin (VANCOCIN) IVPB 1000 mg/200 mL premix        1,000 mg 200 mL/hr over 60 Minutes Intravenous  Once 11/17/20 0439 11/17/20 0727   11/17/20 0445  piperacillin-tazobactam (ZOSYN) IVPB 3.375 g        3.375 g 100 mL/hr over 30 Minutes Intravenous  Once 11/17/20 0439 11/17/20 0555        Objective: Vitals:   11/23/20 1500 11/23/20 2121 11/24/20 0359 11/24/20 0855  BP: 119/60 137/64 (!) 129/56 126/63  Pulse: 87 91 69 90  Resp: 18 18 18 17   Temp: 97.7 F (36.5 C) 98.5 F (36.9 C) 98.8 F (37.1 C) 98.3 F (36.8 C)  TempSrc: Oral Oral  Oral  SpO2: 99% 99% 94% 100%  Weight:      Height:        Intake/Output Summary (Last 24 hours) at 11/24/2020 1313 Last data filed at 11/24/2020 1000 Gross per 24 hour  Intake 618.6 ml  Output --  Net 618.6 ml   Filed Weights   11/17/20 2230 11/20/20 1130 11/22/20 0349  Weight: 85 kg 79.9 kg 78.2 kg    Examination:  General exam: Appears calm and comfortable  Respiratory system: Clear to auscultation. Respiratory effort normal. No respiratory distress. No conversational dyspnea.  Cardiovascular system: S1 & S2 heard, RRR. No murmurs. No pedal edema. Gastrointestinal system: Abdomen is mildly distended but soft and nontender to palpation.  Bowel sounds heard Central nervous system: Alert and oriented. No focal neurological deficits. Speech clear.  Extremities: Symmetric in appearance  Skin: No rashes, lesions or ulcers on exposed skin  Psychiatry: Judgement and insight appear normal. Mood & affect appropriate.   Data Reviewed: I have personally reviewed following labs and imaging studies  CBC: Recent Labs  Lab 11/18/20 0841 11/19/20 0324 11/22/20 0131 11/23/20 0232 11/24/20 0350  WBC 15.8* 15.7* 15.6* 12.1* 9.4  NEUTROABS 13.0* 13.1* 12.5* 8.8* 6.3  HGB 11.6* 11.2* 10.7* 10.5* 10.1*  HCT 36.0*  34.2* 33.5* 32.7* 31.3*  MCV 79.1* 79.2* 80.0 79.8* 80.3  PLT 185 187 209 219 782   Basic Metabolic Panel: Recent Labs  Lab 11/19/20 0324 11/20/20 0043 11/21/20 0054 11/22/20 0131 11/23/20 0232 11/24/20 0350  NA 141 141 139 140 136 139  K 3.3* 3.3* 3.5 3.5 3.3* 3.0*  CL 103 105 104 107 103 104  CO2 25 28 26 27 26 27   GLUCOSE 79 168* 156* 136* 139* 139*  BUN 31* 28* 23 21 23 23   CREATININE 1.77* 1.58* 1.56* 1.47* 1.58* 2.06*  CALCIUM 8.1* 7.8* 7.9* 8.0* 7.7* 7.6*  MG 1.9 2.3 2.2  --  2.1 2.2  PHOS  --   --   --   --  3.2  --    GFR: Estimated Creatinine Clearance: 24.7 mL/min (A) (by C-G formula based on SCr of 2.06 mg/dL (H)). Liver Function Tests: Recent Labs  Lab 11/20/20 0043 11/21/20 0054 11/22/20 0131 11/23/20  5449 11/24/20 0350  AST 23 23 31  50* 51*  ALT 16 21 31  67* 87*  ALKPHOS 73 77 75 75 76  BILITOT 1.2 1.2 0.8 0.8 0.4  PROT 5.7* 6.0* 5.9* 5.7* 5.4*  ALBUMIN 2.6* 2.7* 2.5* 2.4* 2.2*   No results for input(s): LIPASE, AMYLASE in the last 168 hours. No results for input(s): AMMONIA in the last 168 hours. Coagulation Profile: No results for input(s): INR, PROTIME in the last 168 hours. Cardiac Enzymes: No results for input(s): CKTOTAL, CKMB, CKMBINDEX, TROPONINI in the last 168 hours. BNP (last 3 results) No results for input(s): PROBNP in the last 8760 hours. HbA1C: No results for input(s): HGBA1C in the last 72 hours. CBG: No results for input(s): GLUCAP in the last 168 hours. Lipid Profile: Recent Labs    11/23/20 0232  TRIG 161*   Thyroid Function Tests: No results for input(s): TSH, T4TOTAL, FREET4, T3FREE, THYROIDAB in the last 72 hours. Anemia Panel: No results for input(s): VITAMINB12, FOLATE, FERRITIN, TIBC, IRON, RETICCTPCT in the last 72 hours. Sepsis Labs: Recent Labs  Lab 11/17/20 2055 11/21/20 1921 11/21/20 2205 11/22/20 0803  PROCALCITON  --   --   --  4.61  LATICACIDVEN 1.4 3.1* 1.5  --     Recent Results (from the past  240 hour(s))  Resp Panel by RT-PCR (Flu A&B, Covid) Nasopharyngeal Swab     Status: None   Collection Time: 11/17/20  2:00 AM   Specimen: Nasopharyngeal Swab; Nasopharyngeal(NP) swabs in vial transport medium  Result Value Ref Range Status   SARS Coronavirus 2 by RT PCR NEGATIVE NEGATIVE Final    Comment: (NOTE) SARS-CoV-2 target nucleic acids are NOT DETECTED.  The SARS-CoV-2 RNA is generally detectable in upper respiratory specimens during the acute phase of infection. The lowest concentration of SARS-CoV-2 viral copies this assay can detect is 138 copies/mL. A negative result does not preclude SARS-Cov-2 infection and should not be used as the sole basis for treatment or other patient management decisions. A negative result may occur with  improper specimen collection/handling, submission of specimen other than nasopharyngeal swab, presence of viral mutation(s) within the areas targeted by this assay, and inadequate number of viral copies(<138 copies/mL). A negative result must be combined with clinical observations, patient history, and epidemiological information. The expected result is Negative.  Fact Sheet for Patients:  EntrepreneurPulse.com.au  Fact Sheet for Healthcare Providers:  IncredibleEmployment.be  This test is no t yet approved or cleared by the [place] FDA and  has been authorized for detection and/or diagnosis of SARS-CoV-2 by FDA under an Emergency Use Authorization (EUA). This EUA will remain  in effect (meaning this test can be used) for the duration of the COVID-19 declaration under Section 564(b)(1) of the Act, 21 U.S.C.section 360bbb-3(b)(1), unless the authorization is terminated  or revoked sooner.       Influenza A by PCR NEGATIVE NEGATIVE Final   Influenza B by PCR NEGATIVE NEGATIVE Final    Comment: (NOTE) The Xpert Xpress SARS-CoV-2/FLU/RSV plus assay is intended as an aid in the diagnosis of influenza  from Nasopharyngeal swab specimens and should not be used as a sole basis for treatment. Nasal washings and aspirates are unacceptable for Xpert Xpress SARS-CoV-2/FLU/RSV testing.  Fact Sheet for Patients: EntrepreneurPulse.com.au  Fact Sheet for Healthcare Providers: IncredibleEmployment.be  This test is not yet approved or cleared by the [place] FDA and has been authorized for detection and/or diagnosis of SARS-CoV-2 by FDA under an Emergency Use Authorization (EUA).  This EUA will remain in effect (meaning this test can be used) for the duration of the COVID-19 declaration under Section 564(b)(1) of the Act, 21 U.S.C. section 360bbb-3(b)(1), unless the authorization is terminated or revoked.  Performed at [facility], [address], [place], [place] [address]   Blood culture (routine x 2)     Status: None   Collection Time: 11/17/20  5:05 AM   Specimen: BLOOD  Result Value Ref Range Status   Specimen Description BLOOD RIGHT ANTECUBITAL  Final   Special Requests   Final    BOTTLES DRAWN AEROBIC AND ANAEROBIC Blood Culture adequate volume   Culture   Final    NO GROWTH 5 DAYS Performed at [facility], [address], [place], [place] [address]    Report Status 11/22/2020 FINAL  Final  Blood culture (routine x 2)     Status: None   Collection Time: 11/17/20  5:10 AM   Specimen: BLOOD  Result Value Ref Range Status   Specimen Description BLOOD LEFT ANTECUBITAL  Final   Special Requests   Final    BOTTLES DRAWN AEROBIC AND ANAEROBIC Blood Culture adequate volume   Culture   Final    NO GROWTH 5 DAYS Performed at [facility], [address] [place], [place] [address]    Report Status 11/22/2020 FINAL  Final  MRSA PCR Screening     Status: None   Collection Time: 11/18/20  1:04 PM   Specimen: Nasal Mucosa; Nasopharyngeal  Result Value Ref Range Status   MRSA by PCR NEGATIVE NEGATIVE Final    Comment:        The GeneXpert MRSA  Assay (FDA approved for NASAL specimens only), is one component of a comprehensive MRSA colonization surveillance program. It is not intended to diagnose MRSA infection nor to guide or monitor treatment for MRSA infections. Performed at [facility], [place] [address], [place], [place] [address]       Radiology Studies: US RENAL  Result Date: 11/24/2020 CLINICAL DATA:  Acute renal injury EXAM: RENAL / URINARY TRACT ULTRASOUND COMPLETE COMPARISON:  None. FINDINGS: Right Kidney: Renal measurements: 11.8 x 5.4 x 5.3 cm. = volume: 177 mL. Echogenicity within normal limits. No mass or hydronephrosis visualized. Left Kidney: Renal measurements: 9.6 x 4.7 x 4.6 cm. = volume: 106 mL. 1 cm cyst is noted in the lower pole of the left kidney. Bladder: Decompressed by Foley catheter. Other: None. IMPRESSION: Small left renal cyst. No other focal abnormality is noted. Electronically Signed   By: Inez Catalina M.D.   On: 11/24/2020 12:17   [place] EKG SITE RITE  Result Date: 11/22/2020 If Site Rite image not attached, placement could not be confirmed due to current cardiac rhythm.     Scheduled Meds: . Chlorhexidine Gluconate Cloth  6 each Topical Daily  . diltiazem  180 mg Oral Daily  . enoxaparin (LOVENOX) injection  40 mg Subcutaneous Daily  . feeding supplement  237 mL Oral BID BM  . levothyroxine  112 mcg Oral Q0600  . pantoprazole  40 mg Oral Q12H  . potassium chloride  40 mEq Oral Once   Continuous Infusions: . chlorproMAZINE (THORAZINE) IV 25 mg (11/24/20 7902)  . piperacillin-tazobactam (ZOSYN)  IV 3.375 g (11/24/20 1058)     LOS: 7 days      Time spent: 30 minutes   Dessa Phi, DO [facility] 11/24/2020, 1:13 PM   Available via Epic secure chat 7am-7pm After these hours, please refer to  coverage provider listed on [email or web address]

## 2020-11-24 NOTE — TOC Progression Note (Addendum)
Transition of Care [facility]) - Progression Note    Patient Details  Name: Victor Hunter MRN: [id number] Date of Birth: 04-29-1934  Transition of Care [facility]) CM/SW Contact  Jacalyn Lefevre Edson Snowball, RN Phone Number: 11/24/2020, 11:40 AM  Clinical Narrative:     Followed up with patient and daughter Victor Hunter at bedside regarding home health agency decision.  Patient's daughter Victor Hunter is on her way to visit and would like to see list. NCM confirmed with Victor Hunter she has my direct cell number to call. Once decision made NCM will call agency to be sure agency can accept referral.   1540 Spoke to daughter Victor Hunter at bedside, she would like [place] for home health. Patient in agreement.   Referral sent to [facility] with [facility] , awaiting determination. Tommi Rumps with [facility] accepted   Expected Discharge Plan: [place] Barriers to Discharge: Continued Medical Work up  Expected Discharge Plan and Services Expected Discharge Plan: [place]   Discharge Planning Services: CM Consult Post Acute Care Choice: [place] arrangements for the past 2 months: Single Family Home                 DME Arranged: N/A DME Agency: NA       HH Arranged: PT           Social Determinants of Health (SDOH) Interventions    Readmission Risk Interventions No flowsheet data found.

## 2020-11-24 NOTE — Plan of Care (Signed)

## 2020-11-24 NOTE — Progress Notes (Signed)
Patient tolerating soft diet very well today. Daughter, Victor Hunter, states he had a large BM today with a small amount of blood (pt has hemorrhoids). Patient ate 90-95% of his meatloaf and potatoes today for lunch. Up in the chair after his bath for most of the afternoon. Patient currently in bed resting comfortably. No needs at this time.

## 2020-11-25 ENCOUNTER — Inpatient Hospital Stay (HOSPITAL_COMMUNITY): Payer: Medicare PPO

## 2020-11-25 DIAGNOSIS — J9601 Acute respiratory failure with hypoxia: Secondary | ICD-10-CM | POA: Diagnosis not present

## 2020-11-25 LAB — COMPREHENSIVE METABOLIC PANEL
ALT: 90 U/L — ABNORMAL HIGH (ref 0–44)
AST: 45 U/L — ABNORMAL HIGH (ref 15–41)
Albumin: 2.2 g/dL — ABNORMAL LOW (ref 3.5–5.0)
Alkaline Phosphatase: 65 U/L (ref 38–126)
Anion gap: 6 (ref 5–15)
BUN: 22 mg/dL (ref 8–23)
CO2: 26 mmol/L (ref 22–32)
Calcium: 7.3 mg/dL — ABNORMAL LOW (ref 8.9–10.3)
Chloride: 104 mmol/L (ref 98–111)
Creatinine, Ser: 2.28 mg/dL — ABNORMAL HIGH (ref 0.61–1.24)
GFR, Estimated: 28 mL/min — ABNORMAL LOW (ref >60)
Glucose, Bld: 143 mg/dL — ABNORMAL HIGH (ref 70–99)
Potassium: 3.2 mmol/L — ABNORMAL LOW (ref 3.5–5.1)
Sodium: 136 mmol/L (ref 135–145)
Total Bilirubin: 0.3 mg/dL (ref 0.3–1.2)
Total Protein: 5.3 g/dL — ABNORMAL LOW (ref 6.5–8.1)

## 2020-11-25 MED ORDER — AMOXICILLIN-POT CLAVULANATE 875-125 MG PO TABS: 1.0000 | ORAL_TABLET | Freq: Two times a day (BID) | ORAL | Status: DC

## 2020-11-25 MED ORDER — PIPERACILLIN-TAZOBACTAM 3.375 G IVPB: 3.3750 g | Freq: Two times a day (BID) | INTRAVENOUS | Status: DC

## 2020-11-25 MED ORDER — BENZONATATE 100 MG PO CAPS
100.0000 mg | ORAL_CAPSULE | Freq: Three times a day (TID) | ORAL | Status: DC | PRN
  Administered 2020-11-26: 100 mg via ORAL
  Filled 2020-11-25 (×4): qty 1

## 2020-11-25 MED ORDER — POTASSIUM CHLORIDE IN NACL 40-0.9 MEQ/L-% IV SOLN
INTRAVENOUS | Status: DC
  Filled 2020-11-25: qty 1000

## 2020-11-25 MED ORDER — AMOXICILLIN-POT CLAVULANATE 500-125 MG PO TABS
1.0000 | ORAL_TABLET | Freq: Two times a day (BID) | ORAL | Status: AC
  Administered 2020-11-25 – 2020-11-30 (×11): 500 mg via ORAL
  Filled 2020-11-25 (×11): qty 1

## 2020-11-25 MED ORDER — FUROSEMIDE 10 MG/ML IJ SOLN
40.0000 mg | Freq: Once | INTRAMUSCULAR | Status: AC
  Administered 2020-11-25: 40 mg via INTRAVENOUS
  Filled 2020-11-25: qty 4

## 2020-11-25 MED ORDER — ALBUTEROL SULFATE (2.5 MG/3ML) 0.083% IN NEBU
2.5000 mg | INHALATION_SOLUTION | RESPIRATORY_TRACT | Status: DC | PRN
  Administered 2020-11-25 – 2020-12-04 (×7): 2.5 mg via RESPIRATORY_TRACT
  Filled 2020-11-25 (×9): qty 3

## 2020-11-25 MED ORDER — ENOXAPARIN SODIUM 30 MG/0.3ML ~~LOC~~ SOLN
30.0000 mg | Freq: Every day | SUBCUTANEOUS | Status: DC
  Administered 2020-11-26 – 2020-12-05 (×10): 30 mg via SUBCUTANEOUS
  Filled 2020-11-25 (×10): qty 0.3

## 2020-11-25 NOTE — Progress Notes (Signed)
PHARMACY NOTE:  ANTIMICROBIAL RENAL DOSAGE ADJUSTMENT  Current antimicrobial regimen includes a mismatch between antimicrobial dosage and estimated renal function.  As per policy approved by the Pharmacy & Therapeutics and Medical Executive Committees, the antimicrobial dosage will be adjusted accordingly.  Current antimicrobial dosage:  Amoxicill/clavulaunate 875-125mg  BID   Indication: Aspiration pneumonia  Renal Function:  Estimated Creatinine Clearance: 22.4 mL/min (A) (by C-G formula based on SCr of 2.28 mg/dL (H)).      Antimicrobial dosage has been changed to:  500-125mg  BID    Thank you for allowing pharmacy to be a part of this patient's care.  Benetta Spar, PharmD, BCPS, BCCP Clinical Pharmacist  Please check AMION for all [place] phone numbers After 10:00 PM, call [place] [phone]

## 2020-11-25 NOTE — Progress Notes (Signed)
Physical Therapy Treatment Patient Details Name: Victor Hunter MRN: [id number] DOB: 11-23-1933 Today's Date: 11/25/2020    History of Present Illness Pt is an 87 y/o male admitted 3/15 for acute respiratory failure from PNA and SBO. Pt had NG tube upon presentation and it was taken out on 3/20. PMH includes SBO, HTN, and CAD.    PT Comments    Pt progressing well this session but limited due to urinary incontinence in halls.  He continues to require min to min guard assistance this session.  Pt has excellent support at home and HHPT remains appropriate.     Follow Up Recommendations  Home health PT     Equipment Recommendations  None recommended by PT    Recommendations for Other Services       Precautions / Restrictions Precautions Precautions: Fall Precaution Comments: sats Restrictions Weight Bearing Restrictions: No    Mobility  Bed Mobility Overal bed mobility: Needs Assistance Bed Mobility: Sit to Supine Rolling: Modified independent (Device/Increase time)   Supine to sit: Modified independent (Device/Increase time) Sit to supine: Min guard   General bed mobility comments: assistance to bridge in bed and scoot to [facility].  Able to lift LEs back to bed.    Transfers Overall transfer level: Modified independent Equipment used: Rolling walker (2 wheeled) Transfers: Sit to/from Stand Sit to Stand: Min guard         General transfer comment: Cues to scoot forward to edge of recliner and push from chair to rise into standing.  Ambulation/Gait Ambulation/Gait assistance: Min guard Gait Distance (Feet): 70 Feet (x2 ( seated rest period between trials. )) Assistive device: Rolling walker (2 wheeled) Gait Pattern/deviations: Step-through pattern;Decreased stride length;Trunk flexed     General Gait Details: Cues for scap retraction and upper trunk control.  Pt required cues for breathing.  Spo2 92% on RA.  DOE continues to persist with activity.   Stairs              Wheelchair Mobility    Modified Rankin (Stroke Patients Only)       Balance Overall balance assessment: Needs assistance Sitting-balance support: Feet supported Sitting balance-Leahy Scale: Normal     Standing balance support: Bilateral upper extremity supported Standing balance-Leahy Scale: Poor Standing balance comment: min to min guard assistance.               High Level Balance Comments: Pt used RW and maintained fair balance while turning in a complete circle.            Cognition Arousal/Alertness: Awake/alert Behavior During Therapy: WFL for tasks assessed/performed Overall Cognitive Status: Within Functional Limits for tasks assessed Area of Impairment: Safety/judgement;Problem solving                         Safety/Judgement: Decreased awareness of deficits;Decreased awareness of safety   Problem Solving: Slow processing;Decreased initiation;Difficulty sequencing;Requires verbal cues;Requires tactile cues General Comments: Urinary incontinence in halls.      Exercises      General Comments General comments (skin integrity, edema, etc.): Pt's daughter present during session.      Pertinent Vitals/Pain Pain Assessment: 0-10 Pain Score: 0-No pain Faces Pain Scale: No hurt Pain Location: abdomen - indigestion, hiccups Pain Descriptors / Indicators: Discomfort;Grimacing Pain Intervention(s): Monitored during session;Repositioned    Home Living                      Prior Function  PT Goals (current goals can now be found in the care plan section) Acute Rehab PT Goals Patient Stated Goal: return home and go back to work Potential to Achieve Goals: Good Progress towards PT goals: Progressing toward goals    Frequency    Min 3X/week      PT Plan Current plan remains appropriate    Co-evaluation              AM-PAC PT "6 Clicks" Mobility   Outcome Measure  Help needed turning from  your back to your side while in a flat bed without using bedrails?: None Help needed moving from lying on your back to sitting on the side of a flat bed without using bedrails?: A Little Help needed moving to and from a bed to a chair (including a wheelchair)?: A Little Help needed standing up from a chair using your arms (e.g., wheelchair or bedside chair)?: A Little Help needed to walk in hospital room?: A Little Help needed climbing 3-5 steps with a railing? : A Little 6 Click Score: 19    End of Session Equipment Utilized During Treatment: Gait belt Activity Tolerance: Patient limited by fatigue Patient left: with call bell/phone within reach;with family/visitor present;in bed;with bed alarm set Nurse Communication: Mobility status (foley leaking.) PT Visit Diagnosis: Muscle weakness (generalized) (M62.81)     Time: 0881-1031 PT Time Calculation (min) (ACUTE ONLY): 27 min  Charges:  $Gait Training: 8-22 mins $Therapeutic Activity: 8-22 mins                     Victor Hunter , PTA Acute Rehabilitation Services Pager [phone] Office [phone]     Victor Hunter Victor Hunter 11/25/2020, 3:49 PM

## 2020-11-25 NOTE — Progress Notes (Signed)
  PROGRESS NOTE  Called by RN that patient has been coughing, complaining of shortness of breath. His O2 sat is in the 90s on room air. On exam, patient is alert, appropriate, without significant respiratory distress. Lungs +wheezes bilaterally and some crackles throughout. No conversational dyspnea, although RR is elevated.    Check CXR Stop IVF (pt with chronic diastolic HF) Breathing tx    Dessa Phi, DO [facility] 11/25/2020, 6:10 PM  Available via Epic secure chat 7am-7pm After these hours, please refer to coverage provider listed on [email or web address]

## 2020-11-25 NOTE — Final Consult Note (Signed)
Consultant Final Sign-Off Note    Assessment/Final recommendations  Victor Hunter is a 87 y.o. male followed by me for  SBO Small ventral hernia- reducible. No incarceration noted on CT.  - Hx ofLap chole(2003 - Dr. 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 Zeigler-2013) -SBO protocol initiated on admission withcontrast in the colon>He tolerated 24 hours of ngt clamping before ngt was removed>Abd xray3/20with no evidence of bowel obstruction or free air>He is NT on exam with good bowel sounds.Hecontinues to pass flatus and haveloose stool. Burping/belching and hiccupsresolved. He is tolerating a soft diet. No indication for surgery during admission. We will sign off.   Wound care (if applicable): N/A   Diet at discharge: soft/low residue diet for 4 weeks and then high fiber diet thereafter   Activity at discharge: per primary team   Follow-up appointment: PRN if he would like to discuss possible surgery for his hernia    Pending results:  Unresulted Labs (From admission, onward)          Start     Ordered   11/24/20 0500  Comprehensive metabolic panel  Daily,   R      11/23/20 1327           Medication recommendations:    Other recommendations:    Thank you for allowing [place] to participate in the care of your patient!  Please consult [place] again if you have further needs for your patient.  Barth Kirks [facility] 11/25/2020 8:49 AM    Subjective   Patient reports he is doing well.  He denies any abdominal pain, nausea or vomiting.  Finishing most of his soft diet trays yesterday.  Passing flatus.  BM yesterday.  Hiccups improved  Objective  Vital signs in last 24 hours: Temp:  [98.2 F (36.8 C)-99 F (37.2 C)] 99 F (37.2 C) (03/24 0505) Pulse Rate:  [74-95] 74 (03/24 0505) Resp:  [16-17] 16 (03/24 0505) BP: (115-134)/(59-64) 134/64 (03/24 0505) SpO2:  [97 %-100 %] 98 % (03/24 0505) Weight:  [79 kg] 79 kg (03/24  0500)  Gen: Alert, NAD, pleasant Card:Reg Pulm: Normal rate and effort  Abd: Soft, stable distension, NT,+bowel sounds, ventral hernia reducible when patient liedflat inbed but spontaneously reoccurs Psych: A&Ox3 Skin: no rashes noted, warm and dry Msk: BL UE edema noted that is improved. No LE edema   Pertinent labs and Studies: Recent Labs    11/23/20 0232 11/24/20 0350  WBC 12.1* 9.4  HGB 10.5* 10.1*  HCT 32.7* 31.3*   BMET Recent Labs    11/24/20 0350 11/25/20 0405  NA 139 136  K 3.0* 3.2*  CL 104 104  CO2 27 26  GLUCOSE 139* 143*  BUN 23 22  CREATININE 2.06* 2.28*  CALCIUM 7.6* 7.3*   No results for input(s): LABURIN in the last 72 hours. Results for orders placed or performed during the hospital encounter of 11/17/20  Resp Panel by RT-PCR (Flu A&B, Covid) Nasopharyngeal Swab     Status: None   Collection Time: 11/17/20  2:00 AM   Specimen: Nasopharyngeal Swab; Nasopharyngeal(NP) swabs in vial transport medium  Result Value Ref Range Status   SARS Coronavirus 2 by RT PCR NEGATIVE NEGATIVE Final    Comment: (NOTE) SARS-CoV-2 target nucleic acids are NOT DETECTED.  The SARS-CoV-2 RNA is generally detectable in upper respiratory specimens during the acute phase of infection. The lowest concentration of SARS-CoV-2 viral copies this assay can detect is 138 copies/mL. A negative result does not preclude  SARS-Cov-2 infection and should not be used as the sole basis for treatment or other patient management decisions. A negative result may occur with  improper specimen collection/handling, submission of specimen other than nasopharyngeal swab, presence of viral mutation(s) within the areas targeted by this assay, and inadequate number of viral copies(<138 copies/mL). A negative result must be combined with clinical observations, patient history, and epidemiological information. The expected result is Negative.  Fact Sheet for Patients:   EntrepreneurPulse.com.au  Fact Sheet for Healthcare Providers:  IncredibleEmployment.be  This test is no t yet approved or cleared by the [place] FDA and  has been authorized for detection and/or diagnosis of SARS-CoV-2 by FDA under an Emergency Use Authorization (EUA). This EUA will remain  in effect (meaning this test can be used) for the duration of the COVID-19 declaration under Section 564(b)(1) of the Act, 21 U.S.C.section 360bbb-3(b)(1), unless the authorization is terminated  or revoked sooner.       Influenza A by PCR NEGATIVE NEGATIVE Final   Influenza B by PCR NEGATIVE NEGATIVE Final    Comment: (NOTE) The Xpert Xpress SARS-CoV-2/FLU/RSV plus assay is intended as an aid in the diagnosis of influenza from Nasopharyngeal swab specimens and should not be used as a sole basis for treatment. Nasal washings and aspirates are unacceptable for Xpert Xpress SARS-CoV-2/FLU/RSV testing.  Fact Sheet for Patients: EntrepreneurPulse.com.au  Fact Sheet for Healthcare Providers: IncredibleEmployment.be  This test is not yet approved or cleared by the [place] FDA and has been authorized for detection and/or diagnosis of SARS-CoV-2 by FDA under an Emergency Use Authorization (EUA). This EUA will remain in effect (meaning this test can be used) for the duration of the COVID-19 declaration under Section 564(b)(1) of the Act, 21 U.S.C. section 360bbb-3(b)(1), unless the authorization is terminated or revoked.  Performed at [facility], [address] [place], [place] [address]   Blood culture (routine x 2)     Status: None   Collection Time: 11/17/20  5:05 AM   Specimen: BLOOD  Result Value Ref Range Status   Specimen Description BLOOD RIGHT ANTECUBITAL  Final   Special Requests   Final    BOTTLES DRAWN AEROBIC AND ANAEROBIC Blood Culture adequate volume   Culture   Final    NO GROWTH 5  DAYS Performed at [facility], [address] [place], [place] [address]    Report Status 11/22/2020 FINAL  Final  Blood culture (routine x 2)     Status: None   Collection Time: 11/17/20  5:10 AM   Specimen: BLOOD  Result Value Ref Range Status   Specimen Description BLOOD LEFT ANTECUBITAL  Final   Special Requests   Final    BOTTLES DRAWN AEROBIC AND ANAEROBIC Blood Culture adequate volume   Culture   Final    NO GROWTH 5 DAYS Performed at [facility], [address] [place], [place] [address]    Report Status 11/22/2020 FINAL  Final  MRSA PCR Screening     Status: None   Collection Time: 11/18/20  1:04 PM   Specimen: Nasal Mucosa; Nasopharyngeal  Result Value Ref Range Status   MRSA by PCR NEGATIVE NEGATIVE Final    Comment:        The GeneXpert MRSA Assay (FDA approved for NASAL specimens only), is one component of a comprehensive MRSA colonization surveillance program. It is not intended to diagnose MRSA infection nor to guide or monitor treatment for MRSA infections. Performed at [facility], [place]  [address], [place], [place] [address]     Imaging: US RENAL  Result Date: 11/24/2020 CLINICAL DATA:  Acute renal injury EXAM: RENAL / URINARY TRACT ULTRASOUND COMPLETE COMPARISON:  None. FINDINGS: Right Kidney: Renal measurements: 11.8 x 5.4 x 5.3 cm. = volume: 177 mL. Echogenicity within normal limits. No mass or hydronephrosis visualized. Left Kidney: Renal measurements: 9.6 x 4.7 x 4.6 cm. = volume: 106 mL. 1 cm cyst is noted in the lower pole of the left kidney. Bladder: Decompressed by Foley catheter. Other: None. IMPRESSION: Small left renal cyst. No other focal abnormality is noted. Electronically Signed   By: Inez Catalina M.D.   On: 11/24/2020 12:17

## 2020-11-25 NOTE — Progress Notes (Signed)
PROGRESS NOTE    Victor Hunter  RKY:[id number] DOB: Feb 22, 1934 DOA: 11/17/2020 PCP: Rosita Fire, MD     Brief Narrative:  Victor Hunter is an 87 y.o. male with medical history significant for hypertension, esophagitis, BPH who presented to the hospital with chief complaint of shortness of breath, constipation, abdominal distention.  On arrival of EMS, patient was noted to be hypoxic with oxygen saturation of 78% on room air, required up to 15 L high flow nasal cannula O2 on arrival to the emergency department.  Work-up revealed small bowel obstruction with transition in the distal jejunum, bibasilar airspace disease greatest in the left lower lobe.  NG tube was placed, general surgery consulted for SBO.  Patient was also started on IV antibiotic for possible aspiration pneumonia.  New events last 24 hours / Subjective: Patient seen with daughter at bedside.  Tolerating soft diet, had a bowel movement.  Feeling well overall.  Assessment & Plan:   Principal Problem:   Acute respiratory failure with hypoxia (HCC) Active Problems:   Aspiration pneumonia (HCC)   Hypokalemia   AKI (acute kidney injury) ([place])   Leukocytosis   Elevated troponin   Hypothyroidism   Sepsis (HCC)   Gastroesophageal reflux disease with hiatal hernia   BPH (benign prostatic hyperplasia)   Essential hypertension   Small bowel obstruction (HCC)   Flash pulmonary edema (HCC)   Elevated brain natriuretic peptide (BNP) level   Elevated d-dimer   SBO -Resolved and tolerating diet now.  General surgery has signed off  AKI on CKD stage IIIb -Baseline creatinine 1.5 -Hold lisinopril thiazide -Renal ultrasound showed small left renal cyst without focal abnormality noted -Start IV fluids today  Aspiration pneumonia -Zosyn --> Augmentin -Leukocytosis resolved  Hypertension -Lisinopril, thiazide on hold due to AKI, amlodipine on hold due to edema -Resume Cardizem -Blood pressure stable  Severe  hiccups -Continue Thorazine as needed  Demand ischemia -Cardiology signed off 3/19 -Echocardiogram without wall motion abnormality, no ischemic work-up necessary.  IV heparin was discontinued  History of RUE DVT in 2013 -No longer on anticoagulation  Hypothyroidism -Continue Synthroid  Hypokalemia -Replace, trend  DVT prophylaxis:  enoxaparin (LOVENOX) injection 40 mg Start: 11/23/20 1000 SCDs Start: 11/17/20 0538  Code Status: Full code Family Communication: Daughter at bedside Disposition Plan:  Status is: Inpatient  Remains inpatient appropriate because:IV treatments appropriate due to intensity of illness or inability to take PO   Dispo: The patient is from: Home              Anticipated d/c is to: Home              Patient currently is not medically stable to d/c.  IV fluid started due to worsening kidney function, hopeful discharge home in next 2 days   Difficult to place patient No  Consultants:   General surgery  Cardiology  Antimicrobials:  Anti-infectives (From admission, onward)   Start     Dose/Rate Route Frequency Ordered Stop   11/25/20 2200  piperacillin-tazobactam (ZOSYN) IVPB 3.375 g        3.375 g 12.5 mL/hr over 240 Minutes Intravenous Every 12 hours 11/25/20 1039 11/29/20 0959   11/22/20 1130  piperacillin-tazobactam (ZOSYN) IVPB 3.375 g  Status:  Discontinued        3.375 g 12.5 mL/hr over 240 Minutes Intravenous Every 8 hours 11/22/20 1022 11/25/20 1039   11/20/20 1200  piperacillin-tazobactam (ZOSYN) IVPB 3.375 g  Status:  Discontinued  3.375 g 12.5 mL/hr over 240 Minutes Intravenous Every 8 hours 11/20/20 1034 11/21/20 0832   11/19/20 1100  vancomycin (VANCOREADY) IVPB 500 mg/100 mL  Status:  Discontinued        500 mg 100 mL/hr over 60 Minutes Intravenous Every 24 hours 11/18/20 1120 11/19/20 1755   11/18/20 1000  vancomycin (VANCOREADY) IVPB 1500 mg/300 mL        1,500 mg 150 mL/hr over 120 Minutes Intravenous Every 48 hours  11/17/20 0845 11/18/20 1145   11/17/20 1200  piperacillin-tazobactam (ZOSYN) IVPB 3.375 g  Status:  Discontinued        3.375 g 12.5 mL/hr over 240 Minutes Intravenous Every 8 hours 11/17/20 0651 11/20/20 1034   11/17/20 0445  vancomycin (VANCOCIN) IVPB 1000 mg/200 mL premix        1,000 mg 200 mL/hr over 60 Minutes Intravenous  Once 11/17/20 0439 11/17/20 0727   11/17/20 0445  piperacillin-tazobactam (ZOSYN) IVPB 3.375 g        3.375 g 100 mL/hr over 30 Minutes Intravenous  Once 11/17/20 0439 11/17/20 0555       Objective: Vitals:   11/24/20 1358 11/24/20 2032 11/25/20 0500 11/25/20 0505  BP: 120/61 (!) 115/59  134/64  Pulse: 95 89  74  Resp: 16 17  16   Temp: 98.2 F (36.8 C) 98.3 F (36.8 C)  99 F (37.2 C)  TempSrc: Oral   Oral  SpO2: 100% 97%  98%  Weight:   79 kg   Height:        Intake/Output Summary (Last 24 hours) at 11/25/2020 1052 Last data filed at 11/25/2020 0800 Gross per 24 hour  Intake 672.61 ml  Output 425 ml  Net 247.61 ml   Filed Weights   11/20/20 1130 11/22/20 0349 11/25/20 0500  Weight: 79.9 kg 78.2 kg 79 kg    Examination: General exam: Appears calm and comfortable  Respiratory system: Clear to auscultation. Respiratory effort normal. Cardiovascular system: S1 & S2 heard, RRR. +1 pedal edema. Gastrointestinal system: Abdomen is mildly distended, soft and nontender. Normal bowel sounds heard. Central nervous system: Alert and oriented. Non focal exam. Speech clear  Extremities: Symmetric in appearance bilaterally  Skin: No rashes, lesions or ulcers on exposed skin  Psychiatry: Judgement and insight appear stable. Mood & affect appropriate.   Data Reviewed: I have personally reviewed following labs and imaging studies  CBC: Recent Labs  Lab 11/19/20 0324 11/22/20 0131 11/23/20 0232 11/24/20 0350  WBC 15.7* 15.6* 12.1* 9.4  NEUTROABS 13.1* 12.5* 8.8* 6.3  HGB 11.2* 10.7* 10.5* 10.1*  HCT 34.2* 33.5* 32.7* 31.3*  MCV 79.2* 80.0 79.8*  80.3  PLT 187 209 219 573   Basic Metabolic Panel: Recent Labs  Lab 11/19/20 0324 11/20/20 0043 11/21/20 0054 11/22/20 0131 11/23/20 0232 11/24/20 0350 11/25/20 0405  NA 141 141 139 140 136 139 136  K 3.3* 3.3* 3.5 3.5 3.3* 3.0* 3.2*  CL 103 105 104 107 103 104 104  CO2 25 28 26 27 26 27 26   GLUCOSE 79 168* 156* 136* 139* 139* 143*  BUN 31* 28* 23 21 23 23 22   CREATININE 1.77* 1.58* 1.56* 1.47* 1.58* 2.06* 2.28*  CALCIUM 8.1* 7.8* 7.9* 8.0* 7.7* 7.6* 7.3*  MG 1.9 2.3 2.2  --  2.1 2.2  --   PHOS  --   --   --   --  3.2  --   --    GFR: Estimated Creatinine Clearance: 22.4 mL/min (A) (by C-G  formula based on SCr of 2.28 mg/dL (H)). Liver Function Tests: Recent Labs  Lab 11/21/20 0054 11/22/20 0131 11/23/20 0232 11/24/20 0350 11/25/20 0405  AST 23 31 50* 51* 45*  ALT 21 31 67* 87* 90*  ALKPHOS 77 75 75 76 65  BILITOT 1.2 0.8 0.8 0.4 0.3  PROT 6.0* 5.9* 5.7* 5.4* 5.3*  ALBUMIN 2.7* 2.5* 2.4* 2.2* 2.2*   No results for input(s): LIPASE, AMYLASE in the last 168 hours. No results for input(s): AMMONIA in the last 168 hours. Coagulation Profile: No results for input(s): INR, PROTIME in the last 168 hours. Cardiac Enzymes: No results for input(s): CKTOTAL, CKMB, CKMBINDEX, TROPONINI in the last 168 hours. BNP (last 3 results) No results for input(s): PROBNP in the last 8760 hours. HbA1C: No results for input(s): HGBA1C in the last 72 hours. CBG: No results for input(s): GLUCAP in the last 168 hours. Lipid Profile: Recent Labs    11/23/20 0232  TRIG 161*   Thyroid Function Tests: No results for input(s): TSH, T4TOTAL, FREET4, T3FREE, THYROIDAB in the last 72 hours. Anemia Panel: No results for input(s): VITAMINB12, FOLATE, FERRITIN, TIBC, IRON, RETICCTPCT in the last 72 hours. Sepsis Labs: Recent Labs  Lab 11/21/20 1921 11/21/20 2205 11/22/20 0803  PROCALCITON  --   --  4.61  LATICACIDVEN 3.1* 1.5  --     Recent Results (from the past 240 hour(s))  Resp  Panel by RT-PCR (Flu A&B, Covid) Nasopharyngeal Swab     Status: None   Collection Time: 11/17/20  2:00 AM   Specimen: Nasopharyngeal Swab; Nasopharyngeal(NP) swabs in vial transport medium  Result Value Ref Range Status   SARS Coronavirus 2 by RT PCR NEGATIVE NEGATIVE Final    Comment: (NOTE) SARS-CoV-2 target nucleic acids are NOT DETECTED.  The SARS-CoV-2 RNA is generally detectable in upper respiratory specimens during the acute phase of infection. The lowest concentration of SARS-CoV-2 viral copies this assay can detect is 138 copies/mL. A negative result does not preclude SARS-Cov-2 infection and should not be used as the sole basis for treatment or other patient management decisions. A negative result may occur with  improper specimen collection/handling, submission of specimen other than nasopharyngeal swab, presence of viral mutation(s) within the areas targeted by this assay, and inadequate number of viral copies(<138 copies/mL). A negative result must be combined with clinical observations, patient history, and epidemiological information. The expected result is Negative.  Fact Sheet for Patients:  EntrepreneurPulse.com.au  Fact Sheet for Healthcare Providers:  IncredibleEmployment.be  This test is no t yet approved or cleared by the [place] FDA and  has been authorized for detection and/or diagnosis of SARS-CoV-2 by FDA under an Emergency Use Authorization (EUA). This EUA will remain  in effect (meaning this test can be used) for the duration of the COVID-19 declaration under Section 564(b)(1) of the Act, 21 U.S.C.section 360bbb-3(b)(1), unless the authorization is terminated  or revoked sooner.       Influenza A by PCR NEGATIVE NEGATIVE Final   Influenza B by PCR NEGATIVE NEGATIVE Final    Comment: (NOTE) The Xpert Xpress SARS-CoV-2/FLU/RSV plus assay is intended as an aid in the diagnosis of influenza from Nasopharyngeal  swab specimens and should not be used as a sole basis for treatment. Nasal washings and aspirates are unacceptable for Xpert Xpress SARS-CoV-2/FLU/RSV testing.  Fact Sheet for Patients: EntrepreneurPulse.com.au  Fact Sheet for Healthcare Providers: IncredibleEmployment.be  This test is not yet approved or cleared by the [place] FDA and has  been authorized for detection and/or diagnosis of SARS-CoV-2 by FDA under an Emergency Use Authorization (EUA). This EUA will remain in effect (meaning this test can be used) for the duration of the COVID-19 declaration under Section 564(b)(1) of the Act, 21 U.S.C. section 360bbb-3(b)(1), unless the authorization is terminated or revoked.  Performed at [facility], [address] [place], [place] [address]   Blood culture (routine x 2)     Status: None   Collection Time: 11/17/20  5:05 AM   Specimen: BLOOD  Result Value Ref Range Status   Specimen Description BLOOD RIGHT ANTECUBITAL  Final   Special Requests   Final    BOTTLES DRAWN AEROBIC AND ANAEROBIC Blood Culture adequate volume   Culture   Final    NO GROWTH 5 DAYS Performed at [facility], [address], [place], [place] [address]    Report Status 11/22/2020 FINAL  Final  Blood culture (routine x 2)     Status: None   Collection Time: 11/17/20  5:10 AM   Specimen: BLOOD  Result Value Ref Range Status   Specimen Description BLOOD LEFT ANTECUBITAL  Final   Special Requests   Final    BOTTLES DRAWN AEROBIC AND ANAEROBIC Blood Culture adequate volume   Culture   Final    NO GROWTH 5 DAYS Performed at [facility], [address] [place], [place] [address]    Report Status 11/22/2020 FINAL  Final  MRSA PCR Screening     Status: None   Collection Time: 11/18/20  1:04 PM   Specimen: Nasal Mucosa; Nasopharyngeal  Result Value Ref Range Status   MRSA by PCR NEGATIVE NEGATIVE Final    Comment:        The GeneXpert MRSA Assay (FDA approved  for NASAL specimens only), is one component of a comprehensive MRSA colonization surveillance program. It is not intended to diagnose MRSA infection nor to guide or monitor treatment for MRSA infections. Performed at [facility], [place] [address], [place], [place] [address]       Radiology Studies: US RENAL  Result Date: 11/24/2020 CLINICAL DATA:  Acute renal injury EXAM: RENAL / URINARY TRACT ULTRASOUND COMPLETE COMPARISON:  None. FINDINGS: Right Kidney: Renal measurements: 11.8 x 5.4 x 5.3 cm. = volume: 177 mL. Echogenicity within normal limits. No mass or hydronephrosis visualized. Left Kidney: Renal measurements: 9.6 x 4.7 x 4.6 cm. = volume: 106 mL. 1 cm cyst is noted in the lower pole of the left kidney. Bladder: Decompressed by Foley catheter. Other: None. IMPRESSION: Small left renal cyst. No other focal abnormality is noted. Electronically Signed   By: Inez Catalina M.D.   On: 11/24/2020 12:17      Scheduled Meds: . Chlorhexidine Gluconate Cloth  6 each Topical Daily  . diltiazem  180 mg Oral Daily  . enoxaparin (LOVENOX) injection  40 mg Subcutaneous Daily  . feeding supplement  237 mL Oral BID BM  . levothyroxine  112 mcg Oral Q0600  . pantoprazole  40 mg Oral Q12H   Continuous Infusions: . 0.9 % NaCl with KCl 40 mEq / L 75 mL/hr at 11/25/20 0856  . chlorproMAZINE (THORAZINE) IV 25 mg (11/25/20 0542)  . piperacillin-tazobactam (ZOSYN)  IV       LOS: 8 days      Time spent: 30 minutes   Dessa Phi, DO [facility] 11/25/2020, 10:52 AM   Available via Epic secure chat 7am-7pm After these hours, please refer to coverage provider listed on  [email or web address]

## 2020-11-25 NOTE — Progress Notes (Addendum)
Pharmacy Antibiotic Note  Victor Hunter is a 87 y.o. male admitted on 11/17/2020 with pneumonia, possible aspiration. Pharmacy has been consulted for Zosyn dosing; previously on abx from 3/16 >> 3/20. Patient had increased WOB overnight, rapid response called. Abd XR shows left mid to lower lung opacity concerning for PNA therefore abx resumed.   Afebrile, WBC wnl, Scr 2.28 up trending, CLCr ~22 ml/min.    Plan: DC pip/tazo per MD    Height: 5\' 3"  (160 cm) Weight: 79 kg (174 lb 2.6 oz) IBW/kg (Calculated) : 56.9  Temp (24hrs), Avg:98.5 F (36.9 C), Min:98.2 F (36.8 C), Max:99 F (37.2 C)  Recent Labs  Lab 11/19/20 0324 11/20/20 0043 11/21/20 0054 11/21/20 1921 11/21/20 2205 11/22/20 0131 11/23/20 0232 11/24/20 0350 11/25/20 0405  WBC 15.7*  --   --   --   --  15.6* 12.1* 9.4  --   CREATININE 1.77*   < > 1.56*  --   --  1.47* 1.58* 2.06* 2.28*  LATICACIDVEN  --   --   --  3.1* 1.5  --   --   --   --    < > = values in this interval not displayed.    Estimated Creatinine Clearance: 22.4 mL/min (A) (by C-G formula based on SCr of 2.28 mg/dL (H)).    No Known Allergies  Antimicrobials this admission: Vancomycin 3/16 >> 3/18 Zosyn 3/16 >> 3/20, 3/21 >>3/25  Dose adjustments this admission: 3/17 Vancomycin 1.5g Q48hr > 500mg  Q24hr   Microbiology results: 3/16 BCx: NGTD  3/16 flu/covid neg 3/17 MRSA PCR: neg   Thank you for involving pharmacy in this patient's care.  Benetta Spar, PharmD, BCPS, BCCP Clinical Pharmacist  Please check AMION for all [place] phone numbers After 10:00 PM, call [place] [phone]

## 2020-11-25 NOTE — Progress Notes (Signed)
Occupational Therapy Treatment Patient Details Name: DENZIL MCEACHRON MRN: [id number] DOB: 1934/04/04 Today's Date: 11/25/2020    History of present illness Pt is an 87 y/o male admitted 3/15 for acute respiratory failure from PNA and SBO. Pt had NG tube upon presentation and it was taken out on 3/20. PMH includes SBO, HTN, and CAD.   OT comments  Pt demonstrated decreased fatigue throughout grooming and functional activity session. Pt utilized RW for ambulation to bathroom. Pt relied on RW for balance throughout grooming activity, however he was able to balance w/out support. Pt reported no fatigue throughout session. He will benefit from continued acute OT to address all concerns below.    Follow Up Recommendations  Home health OT    Equipment Recommendations  3 in 1 bedside commode;Tub/shower seat    Recommendations for Other Services      Precautions / Restrictions Restrictions Weight Bearing Restrictions: No       Mobility Bed Mobility Overal bed mobility: Modified Independent Bed Mobility: Rolling;Supine to Sit Rolling: Modified independent (Device/Increase time)   Supine to sit: Modified independent (Device/Increase time)     General bed mobility comments: Pt used bed rails to assist with completing supine to sit.    Transfers Overall transfer level: Modified independent Equipment used: Rolling walker (2 wheeled) Transfers: Sit to/from Stand Sit to Stand: Modified independent (Device/Increase time)         General transfer comment: Pt stood from bed using 1 hand on rw and 1 hand on bed.    Balance Overall balance assessment: Modified Independent Sitting-balance support: Feet supported Sitting balance-Leahy Scale: Normal     Standing balance support: Bilateral upper extremity supported Standing balance-Leahy Scale: Fair Standing balance comment: Static balance at sink was good, pt was hesistant to let go of walker, but was able to balance w/out support at  sink.               High Level Balance Comments: Pt used RW and maintained fair balance while turning in a complete circle.           ADL either performed or assessed with clinical judgement   ADL Overall ADL's : Needs assistance/impaired Eating/Feeding: Independent   Grooming: Wash/dry hands;Wash/dry face;Oral care;Supervision/safety;Standing Grooming Details (indicate cue type and reason): Pt stood at sink level for 5 mins to complete grooming activities. Pt denied fatigue throughout activity.         Upper Body Dressing : Set up Upper Body Dressing Details (indicate cue type and reason): Pt was provided additional gown. W/ setup from behind, pt was able to don gown like a jacket.     Toilet Transfer: Copy Details (indicate cue type and reason): Pt used RW to ambulate into the bathroom for grooming/toileting activities.         Functional mobility during ADLs: Supervision/safety;Rolling walker General ADL Comments: Pt demonstrated decreased fatigue throughout functional mobility within room.     Vision   Vision Assessment?: No apparent visual deficits   Perception     Praxis      Cognition Arousal/Alertness: Awake/alert Behavior During Therapy: WFL for tasks assessed/performed Overall Cognitive Status: Within Functional Limits for tasks assessed                                          Exercises     Shoulder Instructions  General Comments Pt's daughter present during session.    Pertinent Vitals/ Pain       Pain Assessment: 0-10 Pain Score: 0-No pain Faces Pain Scale: No hurt  Home Living                                          Prior Functioning/Environment              Frequency  Min 2X/week        Progress Toward Goals  OT Goals(current goals can now be found in the care plan section)  Progress towards OT goals: Progressing toward goals  Acute Rehab OT  Goals Patient Stated Goal: return home and go back to work OT Goal Formulation: With patient/family Time For Goal Achievement: 12/04/20 Potential to Achieve Goals: Good ADL Goals Pt Will Perform Grooming: with modified independence;standing Pt Will Perform Lower Body Bathing: Independently;sit to/from stand Pt Will Perform Lower Body Dressing: Independently;sit to/from stand Pt Will Transfer to Toilet: Independently;ambulating;regular height toilet Pt Will Perform Toileting - Clothing Manipulation and hygiene: Independently;sit to/from stand  Plan Frequency remains appropriate;Discharge plan remains appropriate    Co-evaluation                 AM-PAC OT "6 Clicks" Daily Activity     Outcome Measure   Help from another person eating meals?: None Help from another person taking care of personal grooming?: A Little Help from another person toileting, which includes using toliet, bedpan, or urinal?: A Little Help from another person bathing (including washing, rinsing, drying)?: A Lot Help from another person to put on and taking off regular upper body clothing?: A Little Help from another person to put on and taking off regular lower body clothing?: A Lot 6 Click Score: 17    End of Session Equipment Utilized During Treatment: Rolling walker  OT Visit Diagnosis: Muscle weakness (generalized) (M62.81)   Activity Tolerance Patient tolerated treatment well   Patient Left in chair;with call bell/phone within reach;with chair alarm set;with family/visitor present   Nurse Communication  (To turn off IV alarm.)        Time: 4268-3419 OT Time Calculation (min): 23 min  Charges: OT General Charges $OT Visit: 1 Visit OT Treatments $Self Care/Home Management : 23-37 mins  Mila Pair Yolanda Bonine, OTR/L   Ermalinda Joubert Elane Dashanna Kinnamon 11/25/2020, 1:52 PM

## 2020-11-26 DIAGNOSIS — K56609 Unspecified intestinal obstruction, unspecified as to partial versus complete obstruction: Secondary | ICD-10-CM | POA: Diagnosis not present

## 2020-11-26 LAB — COMPREHENSIVE METABOLIC PANEL
ALT: 93 U/L — ABNORMAL HIGH (ref 0–44)
AST: 42 U/L — ABNORMAL HIGH (ref 15–41)
Albumin: 2.4 g/dL — ABNORMAL LOW (ref 3.5–5.0)
Alkaline Phosphatase: 80 U/L (ref 38–126)
Anion gap: 10 (ref 5–15)
BUN: 21 mg/dL (ref 8–23)
CO2: 23 mmol/L (ref 22–32)
Calcium: 7.6 mg/dL — ABNORMAL LOW (ref 8.9–10.3)
Chloride: 102 mmol/L (ref 98–111)
Creatinine, Ser: 2.1 mg/dL — ABNORMAL HIGH (ref 0.61–1.24)
GFR, Estimated: 30 mL/min — ABNORMAL LOW (ref >60)
Glucose, Bld: 136 mg/dL — ABNORMAL HIGH (ref 70–99)
Potassium: 3.1 mmol/L — ABNORMAL LOW (ref 3.5–5.1)
Sodium: 135 mmol/L (ref 135–145)
Total Bilirubin: 0.5 mg/dL (ref 0.3–1.2)
Total Protein: 5.8 g/dL — ABNORMAL LOW (ref 6.5–8.1)

## 2020-11-26 LAB — MAGNESIUM: Magnesium: 1.9 mg/dL (ref 1.7–2.4)

## 2020-11-26 MED ORDER — HYDROCORTISONE ACETATE 25 MG RE SUPP
25.0000 mg | Freq: Two times a day (BID) | RECTAL | Status: DC | PRN
  Administered 2020-11-27: 25 mg via RECTAL
  Filled 2020-11-26 (×2): qty 1

## 2020-11-26 MED ORDER — WITCH HAZEL-GLYCERIN EX PADS
MEDICATED_PAD | CUTANEOUS | Status: DC | PRN
  Filled 2020-11-26 (×3): qty 100

## 2020-11-26 MED ORDER — FUROSEMIDE 10 MG/ML IJ SOLN
40.0000 mg | Freq: Once | INTRAMUSCULAR | Status: AC
  Administered 2020-11-26: 40 mg via INTRAVENOUS
  Filled 2020-11-26: qty 4

## 2020-11-26 MED ORDER — POTASSIUM CHLORIDE CRYS ER 20 MEQ PO TBCR
40.0000 meq | EXTENDED_RELEASE_TABLET | Freq: Two times a day (BID) | ORAL | Status: AC
  Administered 2020-11-26 (×2): 40 meq via ORAL
  Filled 2020-11-26 (×2): qty 2

## 2020-11-26 NOTE — Progress Notes (Signed)
PROGRESS NOTE    HAI GRABE  IDP:[id number] DOB: [birth year] DOA: 11/17/2020 PCP: Rosita Fire, MD     Brief Narrative:  Victor Hunter is an 87 y.o. male with medical history significant for hypertension, esophagitis, BPH who presented to the hospital with chief complaint of shortness of breath, constipation, abdominal distention.  On arrival of EMS, patient was noted to be hypoxic with oxygen saturation of 78% on room air, required up to 15 L high flow nasal cannula O2 on arrival to the emergency department.  Work-up revealed small bowel obstruction with transition in the distal jejunum, bibasilar airspace disease greatest in the left lower lobe.  NG tube was placed, general surgery consulted for SBO.  Patient was also started on IV antibiotic for possible aspiration pneumonia.  New events last 24 hours / Subjective: Patient seen with granddaughter at bedside.  States that the breathing treatment did not help much last night.  He was given 1 dose of IV Lasix, has been urinating.  He states that his breathing is slightly improved.  Continues to have peripheral edema.  Tolerating diet and having bowel movements.  Assessment & Plan:   Principal Problem:   Small bowel obstruction (HCC) Active Problems:   Aspiration pneumonia (HCC)   Hypokalemia   AKI (acute kidney injury) ([place])   Leukocytosis   Elevated troponin   Hypothyroidism   Sepsis (HCC)   Gastroesophageal reflux disease with hiatal hernia   BPH (benign prostatic hyperplasia)   Essential hypertension   Flash pulmonary edema (HCC)   Elevated brain natriuretic peptide (BNP) level   Elevated d-dimer   SBO -Resolved and tolerating diet now.  General surgery has signed off  AKI on CKD stage IIIb -Baseline creatinine 1.5 -Hold lisinopril thiazide -Renal ultrasound showed small left renal cyst without focal abnormality noted -Improved.  Continue to monitor while getting IV diuretic  Acute on chronic diastolic heart  failure -Has been receiving IV fluid during hospitalization with SBO, AKI.  Now with some shortness of breath, peripheral edema, chest x-ray reviewed independently with perihilar airspace disease -Echocardiogram 11/17/2020 with EF 55 to 60% -Given IV Lasix 3/24.  Additional dose today.  Continue to monitor urine output   Aspiration pneumonia -Zosyn --> Augmentin -Leukocytosis resolved  Hypertension -Lisinopril, thiazide on hold due to AKI, amlodipine on hold due to edema -Resume Cardizem -Blood pressure stable  Severe hiccups -Continue Thorazine as needed  Demand ischemia -Cardiology signed off 3/19 -Echocardiogram without wall motion abnormality, no ischemic work-up necessary.  IV heparin was discontinued  History of RUE DVT in 2013 -No longer on anticoagulation  Hypothyroidism -Continue Synthroid  Hypokalemia -Replace, trend   DVT prophylaxis:  enoxaparin (LOVENOX) injection 30 mg Start: 11/26/20 1000 SCDs Start: 11/17/20 0538  Code Status: Full code Family Communication: Granddaughter at bedside Disposition Plan:  Status is: Inpatient  Remains inpatient appropriate because:IV treatments appropriate due to intensity of illness or inability to take PO   Dispo: The patient is from: Home              Anticipated d/c is to: Home              Patient currently is not medically stable to d/c.  IV Lasix today   Difficult to place patient No  Consultants:   General surgery  Cardiology  Antimicrobials:  Anti-infectives (From admission, onward)   Start     Dose/Rate Route Frequency Ordered Stop   11/25/20 2200  piperacillin-tazobactam (ZOSYN) IVPB 3.375 g  Status:  Discontinued        3.375 g 12.5 mL/hr over 240 Minutes Intravenous Every 12 hours 11/25/20 1039 11/25/20 1055   11/25/20 2000  amoxicillin-clavulanate (AUGMENTIN) 500-125 MG per tablet 500 mg        1 tablet Oral 2 times daily 11/25/20 1109     11/25/20 1145  amoxicillin-clavulanate (AUGMENTIN) 875-125  MG per tablet 1 tablet  Status:  Discontinued        1 tablet Oral Every 12 hours 11/25/20 1055 11/25/20 1108   11/22/20 1130  piperacillin-tazobactam (ZOSYN) IVPB 3.375 g  Status:  Discontinued        3.375 g 12.5 mL/hr over 240 Minutes Intravenous Every 8 hours 11/22/20 1022 11/25/20 1039   11/20/20 1200  piperacillin-tazobactam (ZOSYN) IVPB 3.375 g  Status:  Discontinued        3.375 g 12.5 mL/hr over 240 Minutes Intravenous Every 8 hours 11/20/20 1034 11/21/20 0832   11/19/20 1100  vancomycin (VANCOREADY) IVPB 500 mg/100 mL  Status:  Discontinued        500 mg 100 mL/hr over 60 Minutes Intravenous Every 24 hours 11/18/20 1120 11/19/20 1755   11/18/20 1000  vancomycin (VANCOREADY) IVPB 1500 mg/300 mL        1,500 mg 150 mL/hr over 120 Minutes Intravenous Every 48 hours 11/17/20 0845 11/18/20 1145   11/17/20 1200  piperacillin-tazobactam (ZOSYN) IVPB 3.375 g  Status:  Discontinued        3.375 g 12.5 mL/hr over 240 Minutes Intravenous Every 8 hours 11/17/20 0651 11/20/20 1034   11/17/20 0445  vancomycin (VANCOCIN) IVPB 1000 mg/200 mL premix        1,000 mg 200 mL/hr over 60 Minutes Intravenous  Once 11/17/20 0439 11/17/20 0727   11/17/20 0445  piperacillin-tazobactam (ZOSYN) IVPB 3.375 g        3.375 g 100 mL/hr over 30 Minutes Intravenous  Once 11/17/20 0439 11/17/20 0555       Objective: Vitals:   11/25/20 0505 11/25/20 1447 11/26/20 0504 11/26/20 1011  BP: 134/64 126/60 (!) 137/95   Pulse: 74 79 81 83  Resp: 16 16 17 19   Temp: 99 F (37.2 C) 98.4 F (36.9 C) 99.1 F (37.3 C)   TempSrc: Oral Oral    SpO2: 98% 99% 96% 99%  Weight:      Height:        Intake/Output Summary (Last 24 hours) at 11/26/2020 1041 Last data filed at 11/26/2020 1023 Gross per 24 hour  Intake 1137.28 ml  Output 925 ml  Net 212.28 ml   Filed Weights   11/20/20 1130 11/22/20 0349 11/25/20 0500  Weight: 79.9 kg 78.2 kg 79 kg    Examination: General exam: Appears calm and comfortable   Respiratory system: Diminished breath sounds without crackles or wheezing.Marland Kitchen Respiratory effort normal.  On room air Cardiovascular system: S1 & S2 heard, RRR.  Bilateral pedal edema. Gastrointestinal system: Abdomen is mildly distended, soft and nontender. Normal bowel sounds heard. Central nervous system: Alert and oriented. Non focal exam. Speech clear  Extremities: Symmetric in appearance bilaterally  Skin: No rashes, lesions or ulcers on exposed skin  Psychiatry: Judgement and insight appear stable. Mood & affect appropriate.   Data Reviewed: I have personally reviewed following labs and imaging studies  CBC: Recent Labs  Lab 11/22/20 0131 11/23/20 0232 11/24/20 0350  WBC 15.6* 12.1* 9.4  NEUTROABS 12.5* 8.8* 6.3  HGB 10.7* 10.5* 10.1*  HCT 33.5* 32.7* 31.3*  MCV 80.0 79.8* 80.3  PLT 209 219 712   Basic Metabolic Panel: Recent Labs  Lab 11/20/20 0043 11/21/20 0054 11/22/20 0131 11/23/20 0232 11/24/20 0350 11/25/20 0405 11/26/20 0314  NA 141 139 140 136 139 136 135  K 3.3* 3.5 3.5 3.3* 3.0* 3.2* 3.1*  CL 105 104 107 103 104 104 102  CO2 28 26 27 26 27 26 23   GLUCOSE 168* 156* 136* 139* 139* 143* 136*  BUN 28* 23 21 23 23 22 21   CREATININE 1.58* 1.56* 1.47* 1.58* 2.06* 2.28* 2.10*  CALCIUM 7.8* 7.9* 8.0* 7.7* 7.6* 7.3* 7.6*  MG 2.3 2.2  --  2.1 2.2  --  1.9  PHOS  --   --   --  3.2  --   --   --    GFR: Estimated Creatinine Clearance: 24.3 mL/min (A) (by C-G formula based on SCr of 2.1 mg/dL (H)). Liver Function Tests: Recent Labs  Lab 11/22/20 0131 11/23/20 0232 11/24/20 0350 11/25/20 0405 11/26/20 0314  AST 31 50* 51* 45* 42*  ALT 31 67* 87* 90* 93*  ALKPHOS 75 75 76 65 80  BILITOT 0.8 0.8 0.4 0.3 0.5  PROT 5.9* 5.7* 5.4* 5.3* 5.8*  ALBUMIN 2.5* 2.4* 2.2* 2.2* 2.4*   No results for input(s): LIPASE, AMYLASE in the last 168 hours. No results for input(s): AMMONIA in the last 168 hours. Coagulation Profile: No results for input(s): INR, PROTIME in  the last 168 hours. Cardiac Enzymes: No results for input(s): CKTOTAL, CKMB, CKMBINDEX, TROPONINI in the last 168 hours. BNP (last 3 results) No results for input(s): PROBNP in the last 8760 hours. HbA1C: No results for input(s): HGBA1C in the last 72 hours. CBG: No results for input(s): GLUCAP in the last 168 hours. Lipid Profile: No results for input(s): CHOL, HDL, LDLCALC, TRIG, CHOLHDL, LDLDIRECT in the last 72 hours. Thyroid Function Tests: No results for input(s): TSH, T4TOTAL, FREET4, T3FREE, THYROIDAB in the last 72 hours. Anemia Panel: No results for input(s): VITAMINB12, FOLATE, FERRITIN, TIBC, IRON, RETICCTPCT in the last 72 hours. Sepsis Labs: Recent Labs  Lab 11/21/20 1921 11/21/20 2205 11/22/20 0803  PROCALCITON  --   --  4.61  LATICACIDVEN 3.1* 1.5  --     Recent Results (from the past 240 hour(s))  Resp Panel by RT-PCR (Flu A&B, Covid) Nasopharyngeal Swab     Status: None   Collection Time: 11/17/20  2:00 AM   Specimen: Nasopharyngeal Swab; Nasopharyngeal(NP) swabs in vial transport medium  Result Value Ref Range Status   SARS Coronavirus 2 by RT PCR NEGATIVE NEGATIVE Final    Comment: (NOTE) SARS-CoV-2 target nucleic acids are NOT DETECTED.  The SARS-CoV-2 RNA is generally detectable in upper respiratory specimens during the acute phase of infection. The lowest concentration of SARS-CoV-2 viral copies this assay can detect is 138 copies/mL. A negative result does not preclude SARS-Cov-2 infection and should not be used as the sole basis for treatment or other patient management decisions. A negative result may occur with  improper specimen collection/handling, submission of specimen other than nasopharyngeal swab, presence of viral mutation(s) within the areas targeted by this assay, and inadequate number of viral copies(<138 copies/mL). A negative result must be combined with clinical observations, patient history, and epidemiological information. The  expected result is Negative.  Fact Sheet for Patients:  EntrepreneurPulse.com.au  Fact Sheet for Healthcare Providers:  IncredibleEmployment.be  This test is no t yet approved or cleared by the [place] FDA and  has been authorized for detection and/or  diagnosis of SARS-CoV-2 by FDA under an Emergency Use Authorization (EUA). This EUA will remain  in effect (meaning this test can be used) for the duration of the COVID-19 declaration under Section 564(b)(1) of the Act, 21 U.S.C.section 360bbb-3(b)(1), unless the authorization is terminated  or revoked sooner.       Influenza A by PCR NEGATIVE NEGATIVE Final   Influenza B by PCR NEGATIVE NEGATIVE Final    Comment: (NOTE) The Xpert Xpress SARS-CoV-2/FLU/RSV plus assay is intended as an aid in the diagnosis of influenza from Nasopharyngeal swab specimens and should not be used as a sole basis for treatment. Nasal washings and aspirates are unacceptable for Xpert Xpress SARS-CoV-2/FLU/RSV testing.  Fact Sheet for Patients: EntrepreneurPulse.com.au  Fact Sheet for Healthcare Providers: IncredibleEmployment.be  This test is not yet approved or cleared by the [place] FDA and has been authorized for detection and/or diagnosis of SARS-CoV-2 by FDA under an Emergency Use Authorization (EUA). This EUA will remain in effect (meaning this test can be used) for the duration of the COVID-19 declaration under Section 564(b)(1) of the Act, 21 U.S.C. section 360bbb-3(b)(1), unless the authorization is terminated or revoked.  Performed at [facility], [address], [place], [place] [address]   Blood culture (routine x 2)     Status: None   Collection Time: 11/17/20  5:05 AM   Specimen: BLOOD  Result Value Ref Range Status   Specimen Description BLOOD RIGHT ANTECUBITAL  Final   Special Requests   Final    BOTTLES DRAWN AEROBIC AND ANAEROBIC Blood Culture  adequate volume   Culture   Final    NO GROWTH 5 DAYS Performed at [facility], [address] [place], [place] [address]    Report Status 11/22/2020 FINAL  Final  Blood culture (routine x 2)     Status: None   Collection Time: 11/17/20  5:10 AM   Specimen: BLOOD  Result Value Ref Range Status   Specimen Description BLOOD LEFT ANTECUBITAL  Final   Special Requests   Final    BOTTLES DRAWN AEROBIC AND ANAEROBIC Blood Culture adequate volume   Culture   Final    NO GROWTH 5 DAYS Performed at [facility], [address] [place], [place] [address]    Report Status 11/22/2020 FINAL  Final  MRSA PCR Screening     Status: None   Collection Time: 11/18/20  1:04 PM   Specimen: Nasal Mucosa; Nasopharyngeal  Result Value Ref Range Status   MRSA by PCR NEGATIVE NEGATIVE Final    Comment:        The GeneXpert MRSA Assay (FDA approved for NASAL specimens only), is one component of a comprehensive MRSA colonization surveillance program. It is not intended to diagnose MRSA infection nor to guide or monitor treatment for MRSA infections. Performed at [facility], [place] [address], [place], [place] [address]       Radiology Studies: DG CHEST PORT 1 VIEW  Result Date: 11/25/2020 CLINICAL DATA:  Short of breath EXAM: PORTABLE CHEST 1 VIEW COMPARISON:  11/19/2020 FINDINGS: Single frontal view of the chest demonstrates stable enlargement of the cardiac silhouette. Lung volumes are diminished, with left greater than right perihilar airspace disease. Trace left effusion is suspected. No pneumothorax. Right-sided PICC tip overlies the atriocaval junction. IMPRESSION: 1. Bilateral left greater than right perihilar ground-glass airspace disease, favor asymmetric edema over infection. 2. Trace left pleural effusion. Electronically Signed   By: Randa Ngo M.D.   On: 11/25/2020 20:06  Scheduled Meds: . amoxicillin-clavulanate  1 tablet Oral BID  . Chlorhexidine Gluconate Cloth  6  each Topical Daily  . diltiazem  180 mg Oral Daily  . enoxaparin (LOVENOX) injection  30 mg Subcutaneous Daily  . feeding supplement  237 mL Oral BID BM  . furosemide  40 mg Intravenous Once  . levothyroxine  112 mcg Oral Q0600  . pantoprazole  40 mg Oral Q12H  . potassium chloride  40 mEq Oral BID   Continuous Infusions: . chlorproMAZINE (THORAZINE) IV 25 mg (11/26/20 0527)     LOS: 9 days      Time spent: 30 minutes   Dessa Phi, DO [facility] 11/26/2020, 10:41 AM   Available via Epic secure chat 7am-7pm After these hours, please refer to coverage provider listed on [email or web address]

## 2020-11-26 NOTE — TOC Progression Note (Signed)
Transition of Care [facility]) - Progression Note    Patient Details  Name: Victor Hunter MRN: [id number] Date of Birth: 09-04-1934  [facility] of [facility]) CM/SW Contact  Jacalyn Lefevre Edson Snowball, RN Phone Number: 11/26/2020, 10:57 AM  Clinical Narrative:     In progression thia morning , reported possible DC Sunday. Tommi Rumps with Alvis Lemmings updated  Expected Discharge Plan: [place] Barriers to Discharge: Continued Medical Work up  Expected Discharge Plan and Services Expected Discharge Plan: [place]   Discharge Planning Services: CM Consult Post Acute Care Choice: [place] arrangements for the past 2 months: Single Family Home                 DME Arranged: N/A DME Agency: NA       HH Arranged: PT           Social Determinants of Health (SDOH) Interventions    Readmission Risk Interventions No flowsheet data found.

## 2020-11-27 ENCOUNTER — Inpatient Hospital Stay (HOSPITAL_COMMUNITY): Payer: Medicare PPO

## 2020-11-27 DIAGNOSIS — K56609 Unspecified intestinal obstruction, unspecified as to partial versus complete obstruction: Secondary | ICD-10-CM | POA: Diagnosis not present

## 2020-11-27 LAB — COMPREHENSIVE METABOLIC PANEL
ALT: 113 U/L — ABNORMAL HIGH (ref 0–44)
AST: 65 U/L — ABNORMAL HIGH (ref 15–41)
Albumin: 2.3 g/dL — ABNORMAL LOW (ref 3.5–5.0)
Alkaline Phosphatase: 75 U/L (ref 38–126)
Anion gap: 8 (ref 5–15)
BUN: 18 mg/dL (ref 8–23)
CO2: 23 mmol/L (ref 22–32)
Calcium: 7.6 mg/dL — ABNORMAL LOW (ref 8.9–10.3)
Chloride: 104 mmol/L (ref 98–111)
Creatinine, Ser: 2.07 mg/dL — ABNORMAL HIGH (ref 0.61–1.24)
GFR, Estimated: 31 mL/min — ABNORMAL LOW (ref >60)
Glucose, Bld: 135 mg/dL — ABNORMAL HIGH (ref 70–99)
Potassium: 4.1 mmol/L (ref 3.5–5.1)
Sodium: 135 mmol/L (ref 135–145)
Total Bilirubin: 0.3 mg/dL (ref 0.3–1.2)
Total Protein: 5.6 g/dL — ABNORMAL LOW (ref 6.5–8.1)

## 2020-11-27 LAB — MAGNESIUM: Magnesium: 1.7 mg/dL (ref 1.7–2.4)

## 2020-11-27 MED ORDER — GUAIFENESIN 200 MG PO TABS
200.0000 mg | ORAL_TABLET | ORAL | Status: DC | PRN
  Administered 2020-11-27 – 2020-11-28 (×2): 200 mg via ORAL
  Filled 2020-11-27 (×4): qty 1

## 2020-11-27 MED ORDER — FUROSEMIDE 10 MG/ML IJ SOLN
60.0000 mg | Freq: Once | INTRAMUSCULAR | Status: AC
  Administered 2020-11-27: 60 mg via INTRAVENOUS
  Filled 2020-11-27: qty 6

## 2020-11-27 NOTE — Progress Notes (Signed)
PROGRESS NOTE    Victor Hunter  GQQ:[id number] DOB: 01-13-1934 DOA: 11/17/2020 PCP: Rosita Fire, MD     Brief Narrative:  Victor Hunter is an 87 y.o. male with medical history significant for hypertension, esophagitis, BPH who presented to the hospital with chief complaint of shortness of breath, constipation, abdominal distention.  On arrival of EMS, patient was noted to be hypoxic with oxygen saturation of 78% on room air, required up to 15 L high flow nasal cannula O2 on arrival to the emergency department.  Work-up revealed small bowel obstruction with transition in the distal jejunum, bibasilar airspace disease greatest in the left lower lobe.  NG tube was placed, general surgery consulted for SBO.  Patient was also started on IV antibiotic for possible aspiration pneumonia.  New events last 24 hours / Subjective: Patient sitting at the side of bed.  Continues to have some peripheral edema.  No abdominal pain.  Family at bedside said that he was choking on some peaches.  But tolerated oatmeal this morning without any issues.  He denies any right upper quadrant abdominal pain  Assessment & Plan:   Principal Problem:   Small bowel obstruction (HCC) Active Problems:   Aspiration pneumonia (HCC)   Hypokalemia   AKI (acute kidney injury) ([place])   Leukocytosis   Elevated troponin   Hypothyroidism   Sepsis (HCC)   Gastroesophageal reflux disease with hiatal hernia   BPH (benign prostatic hyperplasia)   Essential hypertension   Flash pulmonary edema (HCC)   Elevated brain natriuretic peptide (BNP) level   Elevated d-dimer   SBO -Resolved and tolerating diet now.  General surgery has signed off  AKI on CKD stage IIIb -Baseline creatinine 1.5 -Hold lisinopril thiazide -Renal ultrasound showed small left renal cyst without focal abnormality noted -Remains stable.  Continue to monitor while getting IV diuretic  Acute on chronic diastolic heart failure -Has been receiving  IV fluid during hospitalization with SBO, AKI.  Now with some shortness of breath, peripheral edema, chest x-ray reviewed independently with perihilar airspace disease -Echocardiogram 11/17/2020 with EF 55 to 60% -Continue IV Lasix, increased dose today   Aspiration pneumonia -Zosyn --> Augmentin -Leukocytosis resolved  Hypertension -Lisinopril, thiazide on hold due to AKI, amlodipine on hold due to edema -Resume Cardizem -Blood pressure stable  Severe hiccups -Continue Thorazine as needed  Demand ischemia -Cardiology signed off 3/19 -Echocardiogram without wall motion abnormality, no ischemic work-up necessary.  IV heparin was discontinued  History of RUE DVT in 2013 -No longer on anticoagulation  Hypothyroidism -Continue Synthroid  Elevated LFT -RUQ [place]: no biliary dilatation, status post cholecystectomy -Continue to monitor -Stop tylenol    DVT prophylaxis:  enoxaparin (LOVENOX) injection 30 mg Start: 11/26/20 1000 SCDs Start: 11/17/20 0538  Code Status: Full code Family Communication: At bedside Disposition Plan:  Status is: Inpatient  Remains inpatient appropriate because:IV treatments appropriate due to intensity of illness or inability to take PO   Dispo: The patient is from: Home              Anticipated d/c is to: Home              Patient currently is not medically stable to d/c.  Continue IV Lasix   Difficult to place patient No  Consultants:   General surgery  Cardiology  Antimicrobials:  Anti-infectives (From admission, onward)   Start     Dose/Rate Route Frequency Ordered Stop   11/25/20 2200  piperacillin-tazobactam (ZOSYN) IVPB 3.375 g  Status:  Discontinued        3.375 g 12.5 mL/hr over 240 Minutes Intravenous Every 12 hours 11/25/20 1039 11/25/20 1055   11/25/20 2000  amoxicillin-clavulanate (AUGMENTIN) 500-125 MG per tablet 500 mg        1 tablet Oral 2 times daily 11/25/20 1109     11/25/20 1145  amoxicillin-clavulanate (AUGMENTIN)  875-125 MG per tablet 1 tablet  Status:  Discontinued        1 tablet Oral Every 12 hours 11/25/20 1055 11/25/20 1108   11/22/20 1130  piperacillin-tazobactam (ZOSYN) IVPB 3.375 g  Status:  Discontinued        3.375 g 12.5 mL/hr over 240 Minutes Intravenous Every 8 hours 11/22/20 1022 11/25/20 1039   11/20/20 1200  piperacillin-tazobactam (ZOSYN) IVPB 3.375 g  Status:  Discontinued        3.375 g 12.5 mL/hr over 240 Minutes Intravenous Every 8 hours 11/20/20 1034 11/21/20 0832   11/19/20 1100  vancomycin (VANCOREADY) IVPB 500 mg/100 mL  Status:  Discontinued        500 mg 100 mL/hr over 60 Minutes Intravenous Every 24 hours 11/18/20 1120 11/19/20 1755   11/18/20 1000  vancomycin (VANCOREADY) IVPB 1500 mg/300 mL        1,500 mg 150 mL/hr over 120 Minutes Intravenous Every 48 hours 11/17/20 0845 11/18/20 1145   11/17/20 1200  piperacillin-tazobactam (ZOSYN) IVPB 3.375 g  Status:  Discontinued        3.375 g 12.5 mL/hr over 240 Minutes Intravenous Every 8 hours 11/17/20 0651 11/20/20 1034   11/17/20 0445  vancomycin (VANCOCIN) IVPB 1000 mg/200 mL premix        1,000 mg 200 mL/hr over 60 Minutes Intravenous  Once 11/17/20 0439 11/17/20 0727   11/17/20 0445  piperacillin-tazobactam (ZOSYN) IVPB 3.375 g        3.375 g 100 mL/hr over 30 Minutes Intravenous  Once 11/17/20 0439 11/17/20 0555       Objective: Vitals:   11/26/20 1011 11/26/20 1240 11/26/20 2016 11/27/20 0600  BP:  131/65 (!) 125/57 (!) 110/55  Pulse: 83 84 81 67  Resp: 19 18    Temp:  98.1 F (36.7 C) 98.5 F (36.9 C) 98.7 F (37.1 C)  TempSrc:  Oral Oral Oral  SpO2: 99% 98% 100% 95%  Weight:      Height:        Intake/Output Summary (Last 24 hours) at 11/27/2020 1111 Last data filed at 11/27/2020 1018 Gross per 24 hour  Intake 360 ml  Output 860 ml  Net -500 ml   Filed Weights   11/20/20 1130 11/22/20 0349 11/25/20 0500  Weight: 79.9 kg 78.2 kg 79 kg    Examination: General exam: Appears calm and  comfortable  Respiratory system: Clear to auscultation. Respiratory effort normal. Cardiovascular system: S1 & S2 heard, RRR. +pedal edema. Gastrointestinal system: Abdomen is mildly distended, soft and nontender. Normal bowel sounds heard. Central nervous system: Alert and oriented. Non focal exam. Speech clear  Extremities: Symmetric in appearance bilaterally  Skin: No rashes, lesions or ulcers on exposed skin  Psychiatry: Judgement and insight appear stable. Mood & affect appropriate.    Data Reviewed: I have personally reviewed following labs and imaging studies  CBC: Recent Labs  Lab 11/22/20 0131 11/23/20 0232 11/24/20 0350  WBC 15.6* 12.1* 9.4  NEUTROABS 12.5* 8.8* 6.3  HGB 10.7* 10.5* 10.1*  HCT 33.5* 32.7* 31.3*  MCV 80.0 79.8* 80.3  PLT 209 219 240  Basic Metabolic Panel: Recent Labs  Lab 11/21/20 0054 11/22/20 0131 11/23/20 0232 11/24/20 0350 11/25/20 0405 11/26/20 0314 11/27/20 0328  NA 139   < > 136 139 136 135 135  K 3.5   < > 3.3* 3.0* 3.2* 3.1* 4.1  CL 104   < > 103 104 104 102 104  CO2 26   < > 26 27 26 23 23   GLUCOSE 156*   < > 139* 139* 143* 136* 135*  BUN 23   < > 23 23 22 21 18   CREATININE 1.56*   < > 1.58* 2.06* 2.28* 2.10* 2.07*  CALCIUM 7.9*   < > 7.7* 7.6* 7.3* 7.6* 7.6*  MG 2.2  --  2.1 2.2  --  1.9 1.7  PHOS  --   --  3.2  --   --   --   --    < > = values in this interval not displayed.   GFR: Estimated Creatinine Clearance: 24.7 mL/min (A) (by C-G formula based on SCr of 2.07 mg/dL (H)). Liver Function Tests: Recent Labs  Lab 11/23/20 0232 11/24/20 0350 11/25/20 0405 11/26/20 0314 11/27/20 0328  AST 50* 51* 45* 42* 65*  ALT 67* 87* 90* 93* 113*  ALKPHOS 75 76 65 80 75  BILITOT 0.8 0.4 0.3 0.5 0.3  PROT 5.7* 5.4* 5.3* 5.8* 5.6*  ALBUMIN 2.4* 2.2* 2.2* 2.4* 2.3*   No results for input(s): LIPASE, AMYLASE in the last 168 hours. No results for input(s): AMMONIA in the last 168 hours. Coagulation Profile: No results for  input(s): INR, PROTIME in the last 168 hours. Cardiac Enzymes: No results for input(s): CKTOTAL, CKMB, CKMBINDEX, TROPONINI in the last 168 hours. BNP (last 3 results) No results for input(s): PROBNP in the last 8760 hours. HbA1C: No results for input(s): HGBA1C in the last 72 hours. CBG: No results for input(s): GLUCAP in the last 168 hours. Lipid Profile: No results for input(s): CHOL, HDL, LDLCALC, TRIG, CHOLHDL, LDLDIRECT in the last 72 hours. Thyroid Function Tests: No results for input(s): TSH, T4TOTAL, FREET4, T3FREE, THYROIDAB in the last 72 hours. Anemia Panel: No results for input(s): VITAMINB12, FOLATE, FERRITIN, TIBC, IRON, RETICCTPCT in the last 72 hours. Sepsis Labs: Recent Labs  Lab 11/21/20 1921 11/21/20 2205 11/22/20 0803  PROCALCITON  --   --  4.61  LATICACIDVEN 3.1* 1.5  --     Recent Results (from the past 240 hour(s))  MRSA PCR Screening     Status: None   Collection Time: 11/18/20  1:04 PM   Specimen: Nasal Mucosa; Nasopharyngeal  Result Value Ref Range Status   MRSA by PCR NEGATIVE NEGATIVE Final    Comment:        The GeneXpert MRSA Assay (FDA approved for NASAL specimens only), is one component of a comprehensive MRSA colonization surveillance program. It is not intended to diagnose MRSA infection nor to guide or monitor treatment for MRSA infections. Performed at [facility], [place] [address], [place], [place] [address]       Radiology Studies: DG CHEST PORT 1 VIEW  Result Date: 11/25/2020 CLINICAL DATA:  Short of breath EXAM: PORTABLE CHEST 1 VIEW COMPARISON:  11/19/2020 FINDINGS: Single frontal view of the chest demonstrates stable enlargement of the cardiac silhouette. Lung volumes are diminished, with left greater than right perihilar airspace disease. Trace left effusion is suspected. No pneumothorax. Right-sided PICC tip overlies the atriocaval junction. IMPRESSION: 1. Bilateral left greater than right perihilar ground-glass  airspace disease, favor  asymmetric edema over infection. 2. Trace left pleural effusion. Electronically Signed   By: Randa Ngo M.D.   On: 11/25/2020 20:06   US Abdomen Limited RUQ (LIVER/GB)  Result Date: 11/27/2020 CLINICAL DATA:  87 year old male with elevated LFTs. History of cholecystectomy. EXAM: ULTRASOUND ABDOMEN LIMITED RIGHT UPPER QUADRANT COMPARISON:  11/17/2020 CT and prior studies FINDINGS: Gallbladder: No gallstones or wall thickening visualized. No sonographic Murphy sign noted by sonographer. Common bile duct: Diameter: 3 mm.  No intrahepatic or extrahepatic biliary dilatation. Liver: No focal lesion identified. Within normal limits in parenchymal echogenicity. Portal vein is patent on color Doppler imaging with normal direction of blood flow towards the liver. Other: None. IMPRESSION: Unremarkable liver.  No biliary dilatation. Status post cholecystectomy. Electronically Signed   By: Margarette Canada M.D.   On: 11/27/2020 10:44      Scheduled Meds: . amoxicillin-clavulanate  1 tablet Oral BID  . Chlorhexidine Gluconate Cloth  6 each Topical Daily  . diltiazem  180 mg Oral Daily  . enoxaparin (LOVENOX) injection  30 mg Subcutaneous Daily  . feeding supplement  237 mL Oral BID BM  . furosemide  60 mg Intravenous Once  . levothyroxine  112 mcg Oral Q0600  . pantoprazole  40 mg Oral Q12H   Continuous Infusions: . chlorproMAZINE (THORAZINE) IV 25 mg (11/27/20 0516)     LOS: 10 days      Time spent: 30 minutes   Dessa Phi, DO [facility] 11/27/2020, 11:11 AM   Available via Epic secure chat 7am-7pm After these hours, please refer to coverage provider listed on [email or web address]

## 2020-11-28 DIAGNOSIS — K56609 Unspecified intestinal obstruction, unspecified as to partial versus complete obstruction: Secondary | ICD-10-CM | POA: Diagnosis not present

## 2020-11-28 LAB — COMPREHENSIVE METABOLIC PANEL
ALT: 108 U/L — ABNORMAL HIGH (ref 0–44)
AST: 51 U/L — ABNORMAL HIGH (ref 15–41)
Albumin: 2.3 g/dL — ABNORMAL LOW (ref 3.5–5.0)
Alkaline Phosphatase: 80 U/L (ref 38–126)
Anion gap: 7 (ref 5–15)
BUN: 14 mg/dL (ref 8–23)
CO2: 24 mmol/L (ref 22–32)
Calcium: 7.5 mg/dL — ABNORMAL LOW (ref 8.9–10.3)
Chloride: 104 mmol/L (ref 98–111)
Creatinine, Ser: 1.95 mg/dL — ABNORMAL HIGH (ref 0.61–1.24)
GFR, Estimated: 33 mL/min — ABNORMAL LOW (ref >60)
Glucose, Bld: 144 mg/dL — ABNORMAL HIGH (ref 70–99)
Potassium: 3.9 mmol/L (ref 3.5–5.1)
Sodium: 135 mmol/L (ref 135–145)
Total Bilirubin: 0.3 mg/dL (ref 0.3–1.2)
Total Protein: 5.5 g/dL — ABNORMAL LOW (ref 6.5–8.1)

## 2020-11-28 LAB — CBC
HCT: 31.1 % — ABNORMAL LOW (ref 39.0–52.0)
Hemoglobin: 9.9 g/dL — ABNORMAL LOW (ref 13.0–17.0)
MCH: 25.8 pg — ABNORMAL LOW (ref 26.0–34.0)
MCHC: 31.8 g/dL (ref 30.0–36.0)
MCV: 81.2 fL (ref 80.0–100.0)
Platelets: 238 10*3/uL (ref 150–400)
RBC: 3.83 MIL/uL — ABNORMAL LOW (ref 4.22–5.81)
RDW: 16.8 % — ABNORMAL HIGH (ref 11.5–15.5)
WBC: 11.8 10*3/uL — ABNORMAL HIGH (ref 4.0–10.5)
nRBC: 0 % (ref 0.0–0.2)

## 2020-11-28 MED ORDER — FUROSEMIDE 10 MG/ML IJ SOLN
60.0000 mg | Freq: Two times a day (BID) | INTRAMUSCULAR | Status: DC
  Administered 2020-11-28 – 2020-12-01 (×7): 60 mg via INTRAVENOUS
  Filled 2020-11-28 (×7): qty 6

## 2020-11-28 NOTE — Progress Notes (Signed)
PROGRESS NOTE    Victor Hunter  POE:[id number] DOB: 12-11-1933 DOA: 11/17/2020 PCP: Rosita Fire, MD     Brief Narrative:  Victor Hunter is an 87 y.o. male with medical history significant for hypertension, esophagitis, BPH who presented to the hospital with chief complaint of shortness of breath, constipation, abdominal distention.  On arrival of EMS, patient was noted to be hypoxic with oxygen saturation of 78% on room air, required up to 15 L high flow nasal cannula O2 on arrival to the emergency department.  Work-up revealed small bowel obstruction with transition in the distal jejunum, bibasilar airspace disease greatest in the left lower lobe.  NG tube was placed, general surgery consulted for SBO.  Patient was also started on IV antibiotic for possible aspiration pneumonia.  Hospitalization further complicated by fluid overload and AKI.  New events last 24 hours / Subjective: Patient seen with daughter at bedside.  He states that he worked with physical therapy this morning and was able to walk around the room.  He continues to have peripheral edema, admits to exertional labored breathing.  No issues with bowel movements or diet.  Assessment & Plan:   Principal Problem:   Small bowel obstruction (HCC) Active Problems:   Aspiration pneumonia (HCC)   Hypokalemia   AKI (acute kidney injury) ([place])   Leukocytosis   Elevated troponin   Hypothyroidism   Sepsis (HCC)   Gastroesophageal reflux disease with hiatal hernia   BPH (benign prostatic hyperplasia)   Essential hypertension   Flash pulmonary edema (HCC)   Elevated brain natriuretic peptide (BNP) level   Elevated d-dimer   SBO -Resolved and tolerating diet now.  General surgery has signed off  AKI on CKD stage IIIb -Baseline creatinine 1.5 -Hold lisinopril thiazide -Renal ultrasound showed small left renal cyst without focal abnormality noted -Slowly improving.  Continue to monitor while getting IV  diuretic  Acute on chronic diastolic heart failure -Has been receiving IV fluid during hospitalization with SBO, AKI.  Now with some shortness of breath, peripheral edema, chest x-ray reviewed independently with perihilar airspace disease -Echocardiogram 11/17/2020 with EF 55 to 60% -Continue IV Lasix, increase 2 twice daily, patient remains fluid overloaded  Aspiration pneumonia -Zosyn --> Augmentin  Hypertension -Lisinopril, thiazide on hold due to AKI, amlodipine on hold due to edema -Resume Cardizem -Blood pressure stable  Severe hiccups -Continue Thorazine as needed  Demand ischemia -Cardiology signed off 3/19 -Echocardiogram without wall motion abnormality, no ischemic work-up necessary.  IV heparin was discontinued  History of RUE DVT in 2013 -No longer on anticoagulation  Hypothyroidism -Continue Synthroid  Elevated LFT -RUQ [place]: no biliary dilatation, status post cholecystectomy -LFT improved overnight, continue to monitor -Stop tylenol    DVT prophylaxis:  enoxaparin (LOVENOX) injection 30 mg Start: 11/26/20 1000 SCDs Start: 11/17/20 0538  Code Status: Full code Family Communication: At bedside Disposition Plan:  Status is: Inpatient  Remains inpatient appropriate because:IV treatments appropriate due to intensity of illness or inability to take PO   Dispo: The patient is from: Home              Anticipated d/c is to: Home              Patient currently is not medically stable to d/c.  Continue IV Lasix, remains fluid overloaded.  Home health on discharge, likely 2 to 3 days.   Difficult to place patient No  Consultants:   General surgery  Cardiology  Antimicrobials:  Anti-infectives (From admission,  onward)   Start     Dose/Rate Route Frequency Ordered Stop   11/25/20 2200  piperacillin-tazobactam (ZOSYN) IVPB 3.375 g  Status:  Discontinued        3.375 g 12.5 mL/hr over 240 Minutes Intravenous Every 12 hours 11/25/20 1039 11/25/20 1055    11/25/20 2000  amoxicillin-clavulanate (AUGMENTIN) 500-125 MG per tablet 500 mg        1 tablet Oral 2 times daily 11/25/20 1109     11/25/20 1145  amoxicillin-clavulanate (AUGMENTIN) 875-125 MG per tablet 1 tablet  Status:  Discontinued        1 tablet Oral Every 12 hours 11/25/20 1055 11/25/20 1108   11/22/20 1130  piperacillin-tazobactam (ZOSYN) IVPB 3.375 g  Status:  Discontinued        3.375 g 12.5 mL/hr over 240 Minutes Intravenous Every 8 hours 11/22/20 1022 11/25/20 1039   11/20/20 1200  piperacillin-tazobactam (ZOSYN) IVPB 3.375 g  Status:  Discontinued        3.375 g 12.5 mL/hr over 240 Minutes Intravenous Every 8 hours 11/20/20 1034 11/21/20 0832   11/19/20 1100  vancomycin (VANCOREADY) IVPB 500 mg/100 mL  Status:  Discontinued        500 mg 100 mL/hr over 60 Minutes Intravenous Every 24 hours 11/18/20 1120 11/19/20 1755   11/18/20 1000  vancomycin (VANCOREADY) IVPB 1500 mg/300 mL        1,500 mg 150 mL/hr over 120 Minutes Intravenous Every 48 hours 11/17/20 0845 11/18/20 1145   11/17/20 1200  piperacillin-tazobactam (ZOSYN) IVPB 3.375 g  Status:  Discontinued        3.375 g 12.5 mL/hr over 240 Minutes Intravenous Every 8 hours 11/17/20 0651 11/20/20 1034   11/17/20 0445  vancomycin (VANCOCIN) IVPB 1000 mg/200 mL premix        1,000 mg 200 mL/hr over 60 Minutes Intravenous  Once 11/17/20 0439 11/17/20 0727   11/17/20 0445  piperacillin-tazobactam (ZOSYN) IVPB 3.375 g        3.375 g 100 mL/hr over 30 Minutes Intravenous  Once 11/17/20 0439 11/17/20 0555       Objective: Vitals:   11/27/20 0600 11/27/20 1341 11/27/20 2006 11/28/20 0438  BP: (!) 110/55 135/69 138/84 137/74  Pulse: 67 77 94 95  Resp:  18 20 18   Temp: 98.7 F (37.1 C) 98.7 F (37.1 C) 99 F (37.2 C) 98.2 F (36.8 C)  TempSrc: Oral Oral Oral Oral  SpO2: 95% 98% 99% 99%  Weight:    78.3 kg  Height:        Intake/Output Summary (Last 24 hours) at 11/28/2020 1027 Last data filed at 11/28/2020  0700 Gross per 24 hour  Intake 374.96 ml  Output 951 ml  Net -576.04 ml   Filed Weights   11/22/20 0349 11/25/20 0500 11/28/20 0438  Weight: 78.2 kg 79 kg 78.3 kg    Examination: General exam: Appears calm and comfortable  Respiratory system: Clear to auscultation. Respiratory effort normal.  On 2 L oxygen Cardiovascular system: S1 & S2 heard, RRR.  Bilateral pedal edema. Gastrointestinal system: Abdomen is mildly distended, soft and nontender. Normal bowel sounds heard. Central nervous system: Alert and oriented. Non focal exam. Speech clear  Extremities: Symmetric in appearance bilaterally  Skin: No rashes, lesions or ulcers on exposed skin  Psychiatry: Judgement and insight appear stable. Mood & affect appropriate.    Data Reviewed: I have personally reviewed following labs and imaging studies  CBC: Recent Labs  Lab 11/22/20 0131 11/23/20 0232  11/24/20 0350 11/28/20 0108  WBC 15.6* 12.1* 9.4 11.8*  NEUTROABS 12.5* 8.8* 6.3  --   HGB 10.7* 10.5* 10.1* 9.9*  HCT 33.5* 32.7* 31.3* 31.1*  MCV 80.0 79.8* 80.3 81.2  PLT 209 219 240 240   Basic Metabolic Panel: Recent Labs  Lab 11/23/20 0232 11/24/20 0350 11/25/20 0405 11/26/20 0314 11/27/20 0328 11/28/20 0108  NA 136 139 136 135 135 135  K 3.3* 3.0* 3.2* 3.1* 4.1 3.9  CL 103 104 104 102 104 104  CO2 26 27 26 23 23 24   GLUCOSE 139* 139* 143* 136* 135* 144*  BUN 23 23 22 21 18 14   CREATININE 1.58* 2.06* 2.28* 2.10* 2.07* 1.95*  CALCIUM 7.7* 7.6* 7.3* 7.6* 7.6* 7.5*  MG 2.1 2.2  --  1.9 1.7  --   PHOS 3.2  --   --   --   --   --    GFR: Estimated Creatinine Clearance: 26.1 mL/min (A) (by C-G formula based on SCr of 1.95 mg/dL (H)). Liver Function Tests: Recent Labs  Lab 11/24/20 0350 11/25/20 0405 11/26/20 0314 11/27/20 0328 11/28/20 0108  AST 51* 45* 42* 65* 51*  ALT 87* 90* 93* 113* 108*  ALKPHOS 76 65 80 75 80  BILITOT 0.4 0.3 0.5 0.3 0.3  PROT 5.4* 5.3* 5.8* 5.6* 5.5*  ALBUMIN 2.2* 2.2* 2.4* 2.3*  2.3*   No results for input(s): LIPASE, AMYLASE in the last 168 hours. No results for input(s): AMMONIA in the last 168 hours. Coagulation Profile: No results for input(s): INR, PROTIME in the last 168 hours. Cardiac Enzymes: No results for input(s): CKTOTAL, CKMB, CKMBINDEX, TROPONINI in the last 168 hours. BNP (last 3 results) No results for input(s): PROBNP in the last 8760 hours. HbA1C: No results for input(s): HGBA1C in the last 72 hours. CBG: No results for input(s): GLUCAP in the last 168 hours. Lipid Profile: No results for input(s): CHOL, HDL, LDLCALC, TRIG, CHOLHDL, LDLDIRECT in the last 72 hours. Thyroid Function Tests: No results for input(s): TSH, T4TOTAL, FREET4, T3FREE, THYROIDAB in the last 72 hours. Anemia Panel: No results for input(s): VITAMINB12, FOLATE, FERRITIN, TIBC, IRON, RETICCTPCT in the last 72 hours. Sepsis Labs: Recent Labs  Lab 11/21/20 1921 11/21/20 2205 11/22/20 0803  PROCALCITON  --   --  4.61  LATICACIDVEN 3.1* 1.5  --     Recent Results (from the past 240 hour(s))  MRSA PCR Screening     Status: None   Collection Time: 11/18/20  1:04 PM   Specimen: Nasal Mucosa; Nasopharyngeal  Result Value Ref Range Status   MRSA by PCR NEGATIVE NEGATIVE Final    Comment:        The GeneXpert MRSA Assay (FDA approved for NASAL specimens only), is one component of a comprehensive MRSA colonization surveillance program. It is not intended to diagnose MRSA infection nor to guide or monitor treatment for MRSA infections. Performed at [facility], [place] [address], [place], [place] [address]       Radiology Studies: US Abdomen Limited RUQ (LIVER/GB)  Result Date: 11/27/2020 CLINICAL DATA:  87 year old male with elevated LFTs. History of cholecystectomy. EXAM: ULTRASOUND ABDOMEN LIMITED RIGHT UPPER QUADRANT COMPARISON:  11/17/2020 CT and prior studies FINDINGS: Gallbladder: No gallstones or wall thickening visualized. No sonographic Murphy sign  noted by sonographer. Common bile duct: Diameter: 3 mm.  No intrahepatic or extrahepatic biliary dilatation. Liver: No focal lesion identified. Within normal limits in parenchymal echogenicity. Portal vein is patent on color Doppler  imaging with normal direction of blood flow towards the liver. Other: None. IMPRESSION: Unremarkable liver.  No biliary dilatation. Status post cholecystectomy. Electronically Signed   By: Margarette Canada M.D.   On: 11/27/2020 10:44      Scheduled Meds: . amoxicillin-clavulanate  1 tablet Oral BID  . Chlorhexidine Gluconate Cloth  6 each Topical Daily  . diltiazem  180 mg Oral Daily  . enoxaparin (LOVENOX) injection  30 mg Subcutaneous Daily  . feeding supplement  237 mL Oral BID BM  . furosemide  60 mg Intravenous BID  . levothyroxine  112 mcg Oral Q0600  . pantoprazole  40 mg Oral Q12H   Continuous Infusions: . chlorproMAZINE (THORAZINE) IV Stopped (11/28/20 7035)     LOS: 11 days      Time spent: 30 minutes   Dessa Phi, DO [facility] 11/28/2020, 10:27 AM   Available via Epic secure chat 7am-7pm After these hours, please refer to coverage provider listed on [email or web address]

## 2020-11-28 NOTE — Progress Notes (Signed)
Physical Therapy Treatment Patient Details Name: Victor Hunter MRN: [id number] DOB: Feb 18, 1934 Today's Date: 11/28/2020    History of Present Illness Pt is an 87 y/o male admitted 3/15 for acute respiratory failure from PNA and SBO. Pt had NG tube upon presentation and it was taken out on 3/20. PMH includes SBO, HTN, and CAD.    PT Comments    Pt continues to demonstrate generalized weakness and increased SOB with activity. He remained on 2L of O2 throughout with SpO2 stable at >95%. Pt would continue to benefit from skilled physical therapy services at this time while admitted and after d/c to address the below listed limitations in order to improve overall safety and independence with functional mobility.   Follow Up Recommendations  Home health PT     Equipment Recommendations  None recommended by PT    Recommendations for Other Services       Precautions / Restrictions Precautions Precautions: Fall Precaution Comments: monitor SpO2 Restrictions Weight Bearing Restrictions: No    Mobility  Bed Mobility Overal bed mobility: Needs Assistance Bed Mobility: Supine to Sit     Supine to sit: Min guard     General bed mobility comments: increased time and effort, HOB slightly elevated, use of bed rails    Transfers Overall transfer level: Needs assistance Equipment used: Rolling walker (2 wheeled) Transfers: Sit to/from Stand Sit to Stand: Supervision         General transfer comment: supervision for safety, no physical assistance required  Ambulation/Gait Ambulation/Gait assistance: Supervision Gait Distance (Feet): 40 Feet Assistive device: Rolling walker (2 wheeled) Gait Pattern/deviations: Step-through pattern;Decreased stride length;Trunk flexed Gait velocity: decr   General Gait Details: pt with slow but steady gait with use of the RW; limited secondary to fatigue and SOB; ambulated on 2L of O2 via [place] with SpO2 maintaining at >95%  throughout   Stairs             Wheelchair Mobility    Modified Rankin (Stroke Patients Only)       Balance Overall balance assessment: Needs assistance Sitting-balance support: Feet supported Sitting balance-Leahy Scale: Good     Standing balance support: Bilateral upper extremity supported Standing balance-Leahy Scale: Poor Standing balance comment: reliant on UE supports on the RW                            Cognition Arousal/Alertness: Awake/alert Behavior During Therapy: [facility] for tasks assessed/performed Overall Cognitive Status: Within Functional Limits for tasks assessed                                        Exercises      General Comments        Pertinent Vitals/Pain Pain Assessment: No/denies pain    Home Living                      Prior Function            PT Goals (current goals can now be found in the care plan section) Acute Rehab PT Goals PT Goal Formulation: With patient Time For Goal Achievement: 12/03/20 Potential to Achieve Goals: Good Progress towards PT goals: Progressing toward goals    Frequency    Min 3X/week      PT Plan Current plan remains appropriate    Co-evaluation  AM-PAC PT "6 Clicks" Mobility   Outcome Measure  Help needed turning from your back to your side while in a flat bed without using bedrails?: None Help needed moving from lying on your back to sitting on the side of a flat bed without using bedrails?: A Little Help needed moving to and from a bed to a chair (including a wheelchair)?: A Little Help needed standing up from a chair using your arms (e.g., wheelchair or bedside chair)?: A Little Help needed to walk in hospital room?: A Little Help needed climbing 3-5 steps with a railing? : A Little 6 Click Score: 19    End of Session Equipment Utilized During Treatment: Oxygen Activity Tolerance: Patient limited by fatigue Patient left: in  chair;with call bell/phone within reach;with family/visitor present Nurse Communication: Mobility status PT Visit Diagnosis: Muscle weakness (generalized) (M62.81)     Time: 1610-9604 PT Time Calculation (min) (ACUTE ONLY): 21 min  Charges:  $Gait Training: 8-22 mins                     Anastasio Champion, DPT  Acute Rehabilitation Services Pager [phone] Office [place] 11/28/2020, 9:06 AM

## 2020-11-29 DIAGNOSIS — K56609 Unspecified intestinal obstruction, unspecified as to partial versus complete obstruction: Secondary | ICD-10-CM | POA: Diagnosis not present

## 2020-11-29 LAB — COMPREHENSIVE METABOLIC PANEL
ALT: 80 U/L — ABNORMAL HIGH (ref 0–44)
AST: 26 U/L (ref 15–41)
Albumin: 2.3 g/dL — ABNORMAL LOW (ref 3.5–5.0)
Alkaline Phosphatase: 80 U/L (ref 38–126)
Anion gap: 7 (ref 5–15)
BUN: 13 mg/dL (ref 8–23)
CO2: 26 mmol/L (ref 22–32)
Calcium: 7.5 mg/dL — ABNORMAL LOW (ref 8.9–10.3)
Chloride: 104 mmol/L (ref 98–111)
Creatinine, Ser: 2.04 mg/dL — ABNORMAL HIGH (ref 0.61–1.24)
GFR, Estimated: 32 mL/min — ABNORMAL LOW (ref >60)
Glucose, Bld: 144 mg/dL — ABNORMAL HIGH (ref 70–99)
Potassium: 3.8 mmol/L (ref 3.5–5.1)
Sodium: 137 mmol/L (ref 135–145)
Total Bilirubin: 0.5 mg/dL (ref 0.3–1.2)
Total Protein: 5.7 g/dL — ABNORMAL LOW (ref 6.5–8.1)

## 2020-11-29 LAB — CBC
HCT: 30.4 % — ABNORMAL LOW (ref 39.0–52.0)
Hemoglobin: 9.6 g/dL — ABNORMAL LOW (ref 13.0–17.0)
MCH: 25.5 pg — ABNORMAL LOW (ref 26.0–34.0)
MCHC: 31.6 g/dL (ref 30.0–36.0)
MCV: 80.9 fL (ref 80.0–100.0)
Platelets: 267 10*3/uL (ref 150–400)
RBC: 3.76 MIL/uL — ABNORMAL LOW (ref 4.22–5.81)
RDW: 16.6 % — ABNORMAL HIGH (ref 11.5–15.5)
WBC: 10.3 10*3/uL (ref 4.0–10.5)
nRBC: 0 % (ref 0.0–0.2)

## 2020-11-29 MED ORDER — HYDROCORTISONE (PERIANAL) 2.5 % EX CREA
TOPICAL_CREAM | Freq: Two times a day (BID) | CUTANEOUS | Status: DC | PRN
  Administered 2020-11-29: 1 via RECTAL
  Filled 2020-11-29 (×2): qty 28.35

## 2020-11-29 MED ORDER — GUAIFENESIN 200 MG PO TABS
400.0000 mg | ORAL_TABLET | ORAL | Status: DC | PRN
  Administered 2020-11-29 – 2020-12-01 (×6): 400 mg via ORAL
  Filled 2020-11-29 (×8): qty 2

## 2020-11-29 NOTE — Progress Notes (Signed)
PROGRESS NOTE    [name]  QIH:[id number] DOB: 18-Feb-1934 DOA: 11/17/2020 PCP: Rosita Fire, MD     Brief Narrative:  Victor Hunter  is an 87 y.o. male with medical history significant for hypertension, esophagitis, BPH who presented to the hospital with chief complaint of shortness of breath, constipation, abdominal distention.  On arrival of EMS, patient was noted to be hypoxic with oxygen saturation of 78% on room air, required up to 15 L high flow nasal cannula O2 on arrival to the emergency department.  Work-up revealed small bowel obstruction with transition in the distal jejunum, bibasilar airspace disease greatest in the left lower lobe.  NG tube was placed, general surgery consulted for SBO.  Patient was also started on IV antibiotic for possible aspiration pneumonia.  Hospitalization further complicated by fluid overload and AKI.  New events last 24 hours / Subjective: Patient seen with daughter at bedside.  Continues to have peripheral edema, but thinks right arm edema has gone down. No new complaints, tolerating meals.   Assessment & Plan:   Principal Problem:   Small bowel obstruction (HCC) Active Problems:   Aspiration pneumonia (HCC)   Hypokalemia   AKI (acute kidney injury) ([place])   Leukocytosis   Elevated troponin   Hypothyroidism   Sepsis (HCC)   Gastroesophageal reflux disease with hiatal hernia   BPH (benign prostatic hyperplasia)   Essential hypertension   Flash pulmonary edema (HCC)   Elevated brain natriuretic peptide (BNP) level   Elevated d-dimer   SBO -Resolved and tolerating diet now.  General surgery has signed off  AKI on CKD stage IIIb -Baseline creatinine 1.5 -Hold lisinopril thiazide -Renal ultrasound showed small left renal cyst without focal abnormality noted -Slowly improving.  Continue to monitor while getting IV diuretic -AM labs pending   Acute on chronic diastolic heart failure -Has been receiving IV fluid during  hospitalization with SBO, AKI.  Now with some shortness of breath, peripheral edema, chest x-ray reviewed independently with perihilar airspace disease -Echocardiogram 11/17/2020 with EF 55 to 60% -Continue IV Lasix, continue 2 twice daily, patient remains fluid overloaded  Aspiration pneumonia -Zosyn --> Augmentin, last dose 3/29   Hypertension -Lisinopril, thiazide on hold due to AKI, amlodipine on hold due to edema -Resume Cardizem -Blood pressure stable  Severe hiccups -Continue Thorazine as needed  Demand ischemia -Cardiology signed off 3/19 -Echocardiogram without wall motion abnormality, no ischemic work-up necessary.  IV heparin was discontinued  History of RUE DVT in 2013 -No longer on anticoagulation  Hypothyroidism -Continue Synthroid  Elevated LFT -RUQ [place]: no biliary dilatation, status post cholecystectomy -Stop tylenol  -Improving, repeat labs pending this morning    DVT prophylaxis:  enoxaparin (LOVENOX) injection 30 mg Start: 11/26/20 1000 SCDs Start: 11/17/20 0538  Code Status: Full code Family Communication: At bedside Disposition Plan:  Status is: Inpatient  Remains inpatient appropriate because:IV treatments appropriate due to intensity of illness or inability to take PO   Dispo: The patient is from: Home              Anticipated d/c is to: Home              Patient currently is not medically stable to d/c.  Continue IV Lasix, remains fluid overloaded.  Home health on discharge, likely 2 to 3 days.   Difficult to place patient No  Consultants:   General surgery  Cardiology  Antimicrobials:  Anti-infectives (From admission, onward)   Start     Dose/Rate Route  Frequency Ordered Stop   11/25/20 2200  piperacillin-tazobactam (ZOSYN) IVPB 3.375 g  Status:  Discontinued        3.375 g 12.5 mL/hr over 240 Minutes Intravenous Every 12 hours 11/25/20 1039 11/25/20 1055   11/25/20 2000  amoxicillin-clavulanate (AUGMENTIN) 500-125 MG per tablet 500  mg        1 tablet Oral 2 times daily 11/25/20 1109     11/25/20 1145  amoxicillin-clavulanate (AUGMENTIN) 875-125 MG per tablet 1 tablet  Status:  Discontinued        1 tablet Oral Every 12 hours 11/25/20 1055 11/25/20 1108   11/22/20 1130  piperacillin-tazobactam (ZOSYN) IVPB 3.375 g  Status:  Discontinued        3.375 g 12.5 mL/hr over 240 Minutes Intravenous Every 8 hours 11/22/20 1022 11/25/20 1039   11/20/20 1200  piperacillin-tazobactam (ZOSYN) IVPB 3.375 g  Status:  Discontinued        3.375 g 12.5 mL/hr over 240 Minutes Intravenous Every 8 hours 11/20/20 1034 11/21/20 0832   11/19/20 1100  vancomycin (VANCOREADY) IVPB 500 mg/100 mL  Status:  Discontinued        500 mg 100 mL/hr over 60 Minutes Intravenous Every 24 hours 11/18/20 1120 11/19/20 1755   11/18/20 1000  vancomycin (VANCOREADY) IVPB 1500 mg/300 mL        1,500 mg 150 mL/hr over 120 Minutes Intravenous Every 48 hours 11/17/20 0845 11/18/20 1145   11/17/20 1200  piperacillin-tazobactam (ZOSYN) IVPB 3.375 g  Status:  Discontinued        3.375 g 12.5 mL/hr over 240 Minutes Intravenous Every 8 hours 11/17/20 0651 11/20/20 1034   11/17/20 0445  vancomycin (VANCOCIN) IVPB 1000 mg/200 mL premix        1,000 mg 200 mL/hr over 60 Minutes Intravenous  Once 11/17/20 0439 11/17/20 0727   11/17/20 0445  piperacillin-tazobactam (ZOSYN) IVPB 3.375 g        3.375 g 100 mL/hr over 30 Minutes Intravenous  Once 11/17/20 0439 11/17/20 0555       Objective: Vitals:   11/28/20 0438 11/28/20 1513 11/28/20 2006 11/29/20 0453  BP: 137/74 (!) 128/58 (!) 138/59 (!) 124/55  Pulse: 95 72 90 73  Resp: 18 16 18 17   Temp: 98.2 F (36.8 C) 99.4 F (37.4 C) 99.2 F (37.3 C) 98.7 F (37.1 C)  TempSrc: Oral Oral Oral   SpO2: 99% 100% 100% 100%  Weight: 78.3 kg     Height:        Intake/Output Summary (Last 24 hours) at 11/29/2020 0947 Last data filed at 11/29/2020 0900 Gross per 24 hour  Intake 1157.13 ml  Output 1475 ml  Net -317.87  ml   Filed Weights   11/22/20 0349 11/25/20 0500 11/28/20 0438  Weight: 78.2 kg 79 kg 78.3 kg    Examination: General exam: Appears calm and comfortable  Respiratory system: Clear to auscultation. Respiratory effort normal. On O2  Cardiovascular system: S1 & S2 heard, RRR. +pedal edema. Gastrointestinal system: Abdomen is mildly distended, soft and nontender. Normal bowel sounds heard. Central nervous system: Alert and oriented. Non focal exam. Speech clear  Extremities: Symmetric in appearance bilaterally  Skin: No rashes, lesions or ulcers on exposed skin  Psychiatry: Judgement and insight appear stable. Mood & affect appropriate.    Data Reviewed: I have personally reviewed following labs and imaging studies  CBC: Recent Labs  Lab 11/23/20 0232 11/24/20 0350 11/28/20 0108  WBC 12.1* 9.4 11.8*  NEUTROABS 8.8* 6.3  --  HGB 10.5* 10.1* 9.9*  HCT 32.7* 31.3* 31.1*  MCV 79.8* 80.3 81.2  PLT 219 240 341   Basic Metabolic Panel: Recent Labs  Lab 11/23/20 0232 11/24/20 0350 11/25/20 0405 11/26/20 0314 11/27/20 0328 11/28/20 0108  NA 136 139 136 135 135 135  K 3.3* 3.0* 3.2* 3.1* 4.1 3.9  CL 103 104 104 102 104 104  CO2 26 27 26 23 23 24   GLUCOSE 139* 139* 143* 136* 135* 144*  BUN 23 23 22 21 18 14   CREATININE 1.58* 2.06* 2.28* 2.10* 2.07* 1.95*  CALCIUM 7.7* 7.6* 7.3* 7.6* 7.6* 7.5*  MG 2.1 2.2  --  1.9 1.7  --   PHOS 3.2  --   --   --   --   --    GFR: Estimated Creatinine Clearance: 26.1 mL/min (A) (by C-G formula based on SCr of 1.95 mg/dL (H)). Liver Function Tests: Recent Labs  Lab 11/24/20 0350 11/25/20 0405 11/26/20 0314 11/27/20 0328 11/28/20 0108  AST 51* 45* 42* 65* 51*  ALT 87* 90* 93* 113* 108*  ALKPHOS 76 65 80 75 80  BILITOT 0.4 0.3 0.5 0.3 0.3  PROT 5.4* 5.3* 5.8* 5.6* 5.5*  ALBUMIN 2.2* 2.2* 2.4* 2.3* 2.3*   No results for input(s): LIPASE, AMYLASE in the last 168 hours. No results for input(s): AMMONIA in the last 168  hours. Coagulation Profile: No results for input(s): INR, PROTIME in the last 168 hours. Cardiac Enzymes: No results for input(s): CKTOTAL, CKMB, CKMBINDEX, TROPONINI in the last 168 hours. BNP (last 3 results) No results for input(s): PROBNP in the last 8760 hours. HbA1C: No results for input(s): HGBA1C in the last 72 hours. CBG: No results for input(s): GLUCAP in the last 168 hours. Lipid Profile: No results for input(s): CHOL, HDL, LDLCALC, TRIG, CHOLHDL, LDLDIRECT in the last 72 hours. Thyroid Function Tests: No results for input(s): TSH, T4TOTAL, FREET4, T3FREE, THYROIDAB in the last 72 hours. Anemia Panel: No results for input(s): VITAMINB12, FOLATE, FERRITIN, TIBC, IRON, RETICCTPCT in the last 72 hours. Sepsis Labs: No results for input(s): PROCALCITON, LATICACIDVEN in the last 168 hours.  No results found for this or any previous visit (from the past 240 hour(s)).    Radiology Studies: No results found.    Scheduled Meds: . amoxicillin-clavulanate  1 tablet Oral BID  . Chlorhexidine Gluconate Cloth  6 each Topical Daily  . diltiazem  180 mg Oral Daily  . enoxaparin (LOVENOX) injection  30 mg Subcutaneous Daily  . feeding supplement  237 mL Oral BID BM  . furosemide  60 mg Intravenous BID  . levothyroxine  112 mcg Oral Q0600  . pantoprazole  40 mg Oral Q12H   Continuous Infusions: . chlorproMAZINE (THORAZINE) IV 50 mL/hr at 11/29/20 0617     LOS: 12 days      Time spent: 20 minutes   Dessa Phi, DO [facility] 11/29/2020, 9:47 AM   Available via Epic secure chat 7am-7pm After these hours, please refer to coverage provider listed on [email or web address]

## 2020-11-29 NOTE — Progress Notes (Signed)
Occupational Therapy Treatment Patient Details Name: Victor Hunter MRN: [id number] DOB: Nov 21, 1933 Today's Date: 11/29/2020    History of present illness Pt is an 87 y/o male admitted 3/15 for acute respiratory failure from PNA and SBO. Pt had NG tube upon presentation and it was taken out on 3/20. PMH includes SBO, HTN, and CAD.   OT comments  This 87 yo male seen today to focus on toilet transfers, mobility, and self care. Pt is progressing nicely with all aspects of mobility and self care with plans on going to his dtrs house some and staying at his house some. He will cotinue to benefit from acute OT with follow up [place]. His sats on RA ranged from 92-94%.   Follow Up Recommendations  Home health OT;Supervision/Assistance - 24 hour    Equipment Recommendations  None recommended by OT       Precautions / Restrictions Precautions Precautions: Fall Precaution Comments: monitor SpO2 Restrictions Weight Bearing Restrictions: No       Mobility Bed Mobility Overal bed mobility: Needs Assistance Bed Mobility: Sit to Supine     Supine to sit: Supervision          Transfers Overall transfer level: Needs assistance Equipment used: Rolling walker (2 wheeled) Transfers: Sit to/from Stand Sit to Stand:  (S from bed, min A from comfort height toilet without use of rail)              Balance Overall balance assessment: Needs assistance Sitting-balance support: No upper extremity supported;Feet supported Sitting balance-Leahy Scale: Good     Standing balance support: Single extremity supported Standing balance-Leahy Scale: Poor                             ADL either performed or assessed with clinical judgement   ADL Overall ADL's : Needs assistance/impaired                         Toilet Transfer: Ambulation;RW;Minimal assistance Toilet Transfer Details (indicate cue type and reason): worked with pt on sit<>stand from toilet with hands on  knees, pt has to use a rocking motion for sit>stand           General ADL Comments: Issued pt a gait belt that his dtr can use with him prn. His dtr asked about steps and I made her aware that I would leave a note for PT to do these with him tomorrow. We did talk about her having 3 chairs (one at bottom, one on landing, and one at top) so he would have a place to sit if need be. Pt reports he already has a 3n1 and shower seat at home.     Vision Patient Visual Report: No change from baseline            Cognition Arousal/Alertness: Awake/alert Behavior During Therapy: WFL for tasks assessed/performed Overall Cognitive Status: Within Functional Limits for tasks assessed                                                     Pertinent Vitals/ Pain       Pain Assessment: No/denies pain         Frequency  Min 2X/week        Progress Toward  Goals  OT Goals(current goals can now be found in the care plan section)  Progress towards OT goals: Progressing toward goals  Acute Rehab OT Goals Patient Stated Goal: return home and go back to work OT Goal Formulation: With patient/family Time For Goal Achievement: 12/04/20 Potential to Achieve Goals: Good  Plan Discharge plan remains appropriate;Equipment recommendations need to be updated       AM-PAC OT "6 Clicks" Daily Activity     Outcome Measure   Help from another person eating meals?: None Help from another person taking care of personal grooming?: A Little Help from another person toileting, which includes using toliet, bedpan, or urinal?: A Little Help from another person bathing (including washing, rinsing, drying)?: A Lot Help from another person to put on and taking off regular upper body clothing?: A Little Help from another person to put on and taking off regular lower body clothing?: A Lot 6 Click Score: 17    End of Session Equipment Utilized During Treatment: Rolling walker;Gait  belt  OT Visit Diagnosis: Unsteadiness on feet (R26.81);Muscle weakness (generalized) (M62.81)   Activity Tolerance Patient tolerated treatment well   Patient Left in bed;with call bell/phone within reach;with bed alarm set;with family/visitor present           Time: 3704-8889 OT Time Calculation (min): 20 min  Charges: OT General Charges $OT Visit: 1 Visit OT Treatments $Self Care/Home Management : 8-22 mins  [place], OTR/L Acute [employer] Pager [phone] Office [phone]      Almon Register 11/29/2020, 5:41 PM

## 2020-11-29 NOTE — Evaluation (Signed)
Clinical/Bedside Swallow Evaluation Patient Details  Name: Victor Hunter MRN: [id number] Date of Birth: [birth year]  Today's Date: 11/29/2020 Time: SLP Start Time (ACUTE ONLY): 1128 SLP Stop Time (ACUTE ONLY): 1142 SLP Time Calculation (min) (ACUTE ONLY): 14 min  Past Medical History:  Past Medical History:  Diagnosis Date  . Blood transfusion without reported diagnosis   . Chronic anemia   . Enlarged prostate    Elevated PSA  . Esophagitis    High grade squamous dysplasia occurring in the background of chronic active esophagitis,, EGD Dr. Christoper Fabian benign biopsies February 2005  . GERD (gastroesophageal reflux disease)   . History of BPH    s/p TURP 2008  . Hypertension   . Ileus ([place])   . Mallory - Weiss tear   . Thyroid disease   . Upper GI bleed    Past Surgical History:  Past Surgical History:  Procedure Laterality Date  . CENTRAL VENOUS CATHETER INSERTION  04/26/2012   Procedure: INSERTION CENTRAL LINE ADULT;  Surgeon: Donato Heinz, MD;  Location: [facility];  Service: General;  Laterality: Left;  started at 0919, ended at 0930,        subclavian  . CHOLECYSTECTOMY    . COLONOSCOPY  10/29/03   rourk-inflammatory polyp from 40 cm removed  . COLONOSCOPY  10/02/2012   Dr. Gala Romney: normal  . ESOPHAGOGASTRODUODENOSCOPY  08/31/08   Rourk-possible extrinsic impression on midesophagus, noncritical Schatzki's ring, 3 mm fundal gland polyp, multiple gastric fundic polyps not manipulated, small hiatal hernia : CT chest showed mediastinal mass, eventually underwent thyroidectomy  . ESOPHAGOGASTRODUODENOSCOPY  08/27/2012   Dr. Cristina Gong: moderate sized hiatal hernia, cameron lesions  . LAPAROTOMY  04/26/2012   Procedure: EXPLORATORY LAPAROTOMY;  Surgeon: Donato Heinz, MD;  Location: [facility];  Service: General;  Laterality: N/A;  . THYROIDECTOMY    . TRANSURETHRAL RESECTION OF PROSTATE     pt denies, but this is listed in his past medical records.    HPI:  Victor Hunter is an 87  y.o. male with medical history significant for hypertension, GERD, esophagitis, ileus, HTN, BPH who presented to the hospital with chief complaint of shortness of breath, constipation, abdominal distention. Work-up revealed small bowel obstruction with transition in the distal jejunum, bibasilar airspace disease greatest in the left lower lobe.  MD noted stated family reported pt choked on peaches 3/26.   Assessment / Plan / Recommendation Clinical Impression  Pt and daughter deny history of dysphagia or pneumonia and reports difficulty masticating the peaches was due to sans dentures which he uses with meals/snacks. There was no observable concern or suspicion of silent aspiration with water, pudding or solid texutre. Discussed his history of GERD and esophagitis and educted re: GER strategies. Recommend continue soft texture (per MD rec), thin liquids, pills with thin and remain upright 30 min after meals. SLP Visit Diagnosis: Dysphagia, unspecified (R13.10)    Aspiration Risk  Mild aspiration risk    Diet Recommendation Other (Comment);Thin liquid (continue soft per MD)   Liquid Administration via: Cup;Straw Medication Administration: Whole meds with liquid Supervision: Staff to assist with self feeding;Patient able to self feed Postural Changes: Seated upright at 90 degrees;Remain upright for at least 30 minutes after po intake    Other  Recommendations Oral Care Recommendations: Oral care BID   Follow up Recommendations None      Frequency and Duration            Prognosis  Swallow Study   General Date of Onset: 11/17/20 HPI: Victor Hunter is an 87 y.o. male with medical history significant for hypertension, GERD, esophagitis, ileus, HTN, BPH who presented to the hospital with chief complaint of shortness of breath, constipation, abdominal distention. Work-up revealed small bowel obstruction with transition in the distal jejunum, bibasilar airspace disease greatest in  the left lower lobe.  MD noted stated family reported pt choked on peaches 3/26. Type of Study: Bedside Swallow Evaluation Previous Swallow Assessment:  (none) Diet Prior to this Study: Thin liquids;Other (Comment) (soft texture) Temperature Spikes Noted: No Respiratory Status: Nasal cannula History of Recent Intubation: No Behavior/Cognition: Alert;Cooperative;Pleasant mood Oral Cavity Assessment: Within Functional Limits Oral Care Completed by SLP: No Oral Cavity - Dentition: Dentures, top;Other (Comment) (no lower dentition) Vision: Functional for self-feeding Self-Feeding Abilities: Needs assist (due edema) Patient Positioning: Upright in bed Baseline Vocal Quality: Normal Volitional Cough: Strong Volitional Swallow: Able to elicit    Oral/Motor/Sensory Function Overall Oral Motor/Sensory Function: Within functional limits   Ice Chips Ice chips: Not tested   Thin Liquid Thin Liquid: Within functional limits Presentation: Cup;Straw    Nectar Thick Nectar Thick Liquid: Not tested   Honey Thick Honey Thick Liquid: Not tested   Puree Puree: Within functional limits   Solid     Solid: Within functional limits      Houston Siren 11/29/2020,1:13 PM   Orbie Pyo [place] Risk analyst [phone] Office [phone]

## 2020-11-30 DIAGNOSIS — K56609 Unspecified intestinal obstruction, unspecified as to partial versus complete obstruction: Secondary | ICD-10-CM | POA: Diagnosis not present

## 2020-11-30 LAB — COMPREHENSIVE METABOLIC PANEL
ALT: 64 U/L — ABNORMAL HIGH (ref 0–44)
AST: 19 U/L (ref 15–41)
Albumin: 2.1 g/dL — ABNORMAL LOW (ref 3.5–5.0)
Alkaline Phosphatase: 74 U/L (ref 38–126)
Anion gap: 9 (ref 5–15)
BUN: 11 mg/dL (ref 8–23)
CO2: 26 mmol/L (ref 22–32)
Calcium: 7.4 mg/dL — ABNORMAL LOW (ref 8.9–10.3)
Chloride: 101 mmol/L (ref 98–111)
Creatinine, Ser: 1.83 mg/dL — ABNORMAL HIGH (ref 0.61–1.24)
GFR, Estimated: 36 mL/min — ABNORMAL LOW (ref >60)
Glucose, Bld: 119 mg/dL — ABNORMAL HIGH (ref 70–99)
Potassium: 3.5 mmol/L (ref 3.5–5.1)
Sodium: 136 mmol/L (ref 135–145)
Total Bilirubin: 0.5 mg/dL (ref 0.3–1.2)
Total Protein: 5.6 g/dL — ABNORMAL LOW (ref 6.5–8.1)

## 2020-11-30 MED ORDER — SODIUM CHLORIDE 0.9 % IV SOLN
25.0000 mg | Freq: Three times a day (TID) | INTRAVENOUS | Status: DC | PRN
  Administered 2020-12-01 – 2020-12-03 (×4): 25 mg via INTRAVENOUS
  Filled 2020-11-30 (×6): qty 1

## 2020-11-30 NOTE — Progress Notes (Signed)
PROGRESS NOTE    Victor Hunter  WUJ:[id number] DOB: May 14, 1934 DOA: 11/17/2020 PCP: Rosita Fire, MD     Brief Narrative:  Victor Hunter is an 87 y.o. male with medical history significant for hypertension, esophagitis, BPH who presented to the hospital with chief complaint of shortness of breath, constipation, abdominal distention.  On arrival of EMS, patient was noted to be hypoxic with oxygen saturation of 78% on room air, required up to 15 L high flow nasal cannula O2 on arrival to the emergency department.  Work-up revealed small bowel obstruction with transition in the distal jejunum, bibasilar airspace disease greatest in the left lower lobe.  NG tube was placed, general surgery consulted for SBO.  Patient was also started on IV antibiotic for possible aspiration pneumonia.  Hospitalization further complicated by fluid overload and AKI.  New events last 24 hours / Subjective: Patient seen with daughter at bedside.  No new complaints this morning.  Has left forearm swelling from IV infiltration site.  Assessment & Plan:   Principal Problem:   Small bowel obstruction (HCC) Active Problems:   Aspiration pneumonia (HCC)   Hypokalemia   AKI (acute kidney injury) ([place])   Leukocytosis   Elevated troponin   Hypothyroidism   Sepsis (HCC)   Gastroesophageal reflux disease with hiatal hernia   BPH (benign prostatic hyperplasia)   Essential hypertension   Flash pulmonary edema (HCC)   Elevated brain natriuretic peptide (BNP) level   Elevated d-dimer   SBO -Resolved and tolerating diet now.  General surgery has signed off  AKI on CKD stage IIIb -Baseline creatinine 1.5 -Hold lisinopril thiazide -Renal ultrasound showed small left renal cyst without focal abnormality noted -Slowly improving.  Continue to monitor while getting IV diuretic  Acute on chronic diastolic heart failure -Has been receiving IV fluid during hospitalization with SBO, AKI.  Now with some shortness of  breath, peripheral edema, chest x-ray reviewed independently with perihilar airspace disease -Echocardiogram 11/17/2020 with EF 55 to 60% -Continue IV Lasix, continue 2 twice daily, patient remains fluid overloaded  Aspiration pneumonia -Zosyn --> Augmentin, last dose 3/29   Hypertension -Lisinopril, thiazide on hold due to AKI, amlodipine on hold due to edema -Continue Cardizem -Blood pressure stable  Severe hiccups -Continue Thorazine as needed  Demand ischemia -Cardiology signed off 3/19 -Echocardiogram without wall motion abnormality, no ischemic work-up necessary.  IV heparin was discontinued  History of RUE DVT in 2013 -No longer on anticoagulation  Hypothyroidism -Continue Synthroid  Elevated LFT -RUQ [place]: no biliary dilatation, status post cholecystectomy -Stop tylenol  -Improving  Left forearm swelling -At site of previous IV infiltration -Obtain venous Doppler to rule out DVT   DVT prophylaxis:  enoxaparin (LOVENOX) injection 30 mg Start: 11/26/20 1000 SCDs Start: 11/17/20 0538  Code Status: Full code Family Communication: At bedside Disposition Plan:  Status is: Inpatient  Remains inpatient appropriate because:IV treatments appropriate due to intensity of illness or inability to take PO   Dispo: The patient is from: Home              Anticipated d/c is to: Home              Patient currently is not medically stable to d/c.  Continue IV Lasix, remains fluid overloaded.  Home health on discharge, likely 2 to 3 days.   Difficult to place patient No  Consultants:   General surgery  Cardiology  Antimicrobials:  Anti-infectives (From admission, onward)   Start  Dose/Rate Route Frequency Ordered Stop   11/25/20 2200  piperacillin-tazobactam (ZOSYN) IVPB 3.375 g  Status:  Discontinued        3.375 g 12.5 mL/hr over 240 Minutes Intravenous Every 12 hours 11/25/20 1039 11/25/20 1055   11/25/20 2000  amoxicillin-clavulanate (AUGMENTIN) 500-125 MG per  tablet 500 mg        1 tablet Oral 2 times daily 11/25/20 1109 11/30/20 2359   11/25/20 1145  amoxicillin-clavulanate (AUGMENTIN) 875-125 MG per tablet 1 tablet  Status:  Discontinued        1 tablet Oral Every 12 hours 11/25/20 1055 11/25/20 1108   11/22/20 1130  piperacillin-tazobactam (ZOSYN) IVPB 3.375 g  Status:  Discontinued        3.375 g 12.5 mL/hr over 240 Minutes Intravenous Every 8 hours 11/22/20 1022 11/25/20 1039   11/20/20 1200  piperacillin-tazobactam (ZOSYN) IVPB 3.375 g  Status:  Discontinued        3.375 g 12.5 mL/hr over 240 Minutes Intravenous Every 8 hours 11/20/20 1034 11/21/20 0832   11/19/20 1100  vancomycin (VANCOREADY) IVPB 500 mg/100 mL  Status:  Discontinued        500 mg 100 mL/hr over 60 Minutes Intravenous Every 24 hours 11/18/20 1120 11/19/20 1755   11/18/20 1000  vancomycin (VANCOREADY) IVPB 1500 mg/300 mL        1,500 mg 150 mL/hr over 120 Minutes Intravenous Every 48 hours 11/17/20 0845 11/18/20 1145   11/17/20 1200  piperacillin-tazobactam (ZOSYN) IVPB 3.375 g  Status:  Discontinued        3.375 g 12.5 mL/hr over 240 Minutes Intravenous Every 8 hours 11/17/20 0651 11/20/20 1034   11/17/20 0445  vancomycin (VANCOCIN) IVPB 1000 mg/200 mL premix        1,000 mg 200 mL/hr over 60 Minutes Intravenous  Once 11/17/20 0439 11/17/20 0727   11/17/20 0445  piperacillin-tazobactam (ZOSYN) IVPB 3.375 g        3.375 g 100 mL/hr over 30 Minutes Intravenous  Once 11/17/20 0439 11/17/20 0555       Objective: Vitals:   11/29/20 0453 11/29/20 1342 11/29/20 1936 11/30/20 0440  BP: (!) 124/55 (!) 125/56 (!) 131/49 (!) 120/50  Pulse: 73 71 78 69  Resp: 17 20 18 17   Temp: 98.7 F (37.1 C) 98.7 F (37.1 C) 99.5 F (37.5 C) 99 F (37.2 C)  TempSrc:  Oral Oral Oral  SpO2: 100% 98% 98% 97%  Weight:      Height:        Intake/Output Summary (Last 24 hours) at 11/30/2020 1400 Last data filed at 11/30/2020 1120 Gross per 24 hour  Intake 559.85 ml  Output 1825 ml   Net -1265.15 ml   Filed Weights   11/22/20 0349 11/25/20 0500 11/28/20 0438  Weight: 78.2 kg 79 kg 78.3 kg    Examination: General exam: Appears calm and comfortable  Respiratory system: Clear to auscultation. Respiratory effort normal.  On room air Cardiovascular system: S1 & S2 heard, RRR.  Bilateral pedal edema. Gastrointestinal system: Abdomen is mildly distended, soft and nontender. Normal bowel sounds heard. Central nervous system: Alert and oriented. Non focal exam. Speech clear  Extremities: Left forearm with edema worse compared to right Psychiatry: Judgement and insight appear stable. Mood & affect appropriate.    Data Reviewed: I have personally reviewed following labs and imaging studies  CBC: Recent Labs  Lab 11/24/20 0350 11/28/20 0108 11/29/20 1048  WBC 9.4 11.8* 10.3  NEUTROABS 6.3  --   --  HGB 10.1* 9.9* 9.6*  HCT 31.3* 31.1* 30.4*  MCV 80.3 81.2 80.9  PLT 240 238 048   Basic Metabolic Panel: Recent Labs  Lab 11/24/20 0350 11/25/20 0405 11/26/20 0314 11/27/20 0328 11/28/20 0108 11/29/20 1048 11/30/20 0133  NA 139   < > 135 135 135 137 136  K 3.0*   < > 3.1* 4.1 3.9 3.8 3.5  CL 104   < > 102 104 104 104 101  CO2 27   < > 23 23 24 26 26   GLUCOSE 139*   < > 136* 135* 144* 144* 119*  BUN 23   < > 21 18 14 13 11   CREATININE 2.06*   < > 2.10* 2.07* 1.95* 2.04* 1.83*  CALCIUM 7.6*   < > 7.6* 7.6* 7.5* 7.5* 7.4*  MG 2.2  --  1.9 1.7  --   --   --    < > = values in this interval not displayed.   GFR: Estimated Creatinine Clearance: 27.8 mL/min (A) (by C-G formula based on SCr of 1.83 mg/dL (H)). Liver Function Tests: Recent Labs  Lab 11/26/20 0314 11/27/20 0328 11/28/20 0108 11/29/20 1048 11/30/20 0133  AST 42* 65* 51* 26 19  ALT 93* 113* 108* 80* 64*  ALKPHOS 80 75 80 80 74  BILITOT 0.5 0.3 0.3 0.5 0.5  PROT 5.8* 5.6* 5.5* 5.7* 5.6*  ALBUMIN 2.4* 2.3* 2.3* 2.3* 2.1*   No results for input(s): LIPASE, AMYLASE in the last 168  hours. No results for input(s): AMMONIA in the last 168 hours. Coagulation Profile: No results for input(s): INR, PROTIME in the last 168 hours. Cardiac Enzymes: No results for input(s): CKTOTAL, CKMB, CKMBINDEX, TROPONINI in the last 168 hours. BNP (last 3 results) No results for input(s): PROBNP in the last 8760 hours. HbA1C: No results for input(s): HGBA1C in the last 72 hours. CBG: No results for input(s): GLUCAP in the last 168 hours. Lipid Profile: No results for input(s): CHOL, HDL, LDLCALC, TRIG, CHOLHDL, LDLDIRECT in the last 72 hours. Thyroid Function Tests: No results for input(s): TSH, T4TOTAL, FREET4, T3FREE, THYROIDAB in the last 72 hours. Anemia Panel: No results for input(s): VITAMINB12, FOLATE, FERRITIN, TIBC, IRON, RETICCTPCT in the last 72 hours. Sepsis Labs: No results for input(s): PROCALCITON, LATICACIDVEN in the last 168 hours.  No results found for this or any previous visit (from the past 240 hour(s)).    Radiology Studies: No results found.    Scheduled Meds: . amoxicillin-clavulanate  1 tablet Oral BID  . Chlorhexidine Gluconate Cloth  6 each Topical Daily  . diltiazem  180 mg Oral Daily  . enoxaparin (LOVENOX) injection  30 mg Subcutaneous Daily  . feeding supplement  237 mL Oral BID BM  . furosemide  60 mg Intravenous BID  . levothyroxine  112 mcg Oral Q0600  . pantoprazole  40 mg Oral Q12H   Continuous Infusions: . chlorproMAZINE (THORAZINE) IV 25 mg (11/30/20 8891)     LOS: 13 days      Time spent: 20 minutes   Dessa Phi, DO [facility] 11/30/2020, 2:00 PM   Available via Epic secure chat 7am-7pm After these hours, please refer to coverage provider listed on [email or web address]

## 2020-11-30 NOTE — Progress Notes (Signed)
Physical Therapy Treatment Patient Details Name: Victor Hunter MRN: [id number] DOB: 1934-03-23 Today's Date: 11/30/2020    History of Present Illness Pt is an 87 y/o male admitted 3/15 for acute respiratory failure from PNA and SBO. Pt had NG tube upon presentation and it was taken out on 3/20. PMH includes SBO, HTN, and CAD.    PT Comments    Session focused on stair training. Pt plans to d/c home with family member, where he will have 7 steps with a right railing to bedroom. Pt negotiated 4 steps with a right railing and left handheld assist; required minA for ascension and more difficulty with descent, requiring modA. HR stable in 90's, SpO2 93-96%. Discussed level of assist with pt/pt daughter and have a chair set up to allow pt to take a rest break at the landing. Will continue to follow acutely to progress as tolerated.     Follow Up Recommendations  Home health PT;Supervision for mobility/OOB     Equipment Recommendations  None recommended by PT    Recommendations for Other Services       Precautions / Restrictions Precautions Precautions: Fall Precaution Comments: monitor SpO2 Restrictions Weight Bearing Restrictions: No    Mobility  Bed Mobility               General bed mobility comments: Sitting EOB upon arrival    Transfers Overall transfer level: Needs assistance Equipment used: Rolling walker (2 wheeled) Transfers: Sit to/from Stand Sit to Stand: Min guard;Min assist         General transfer comment: Min guard from edge of bed, minA to boost from recliner. Cues for scooting to edge  Ambulation/Gait Ambulation/Gait assistance: Supervision   Assistive device: Rolling walker (2 wheeled) Gait Pattern/deviations: Step-through pattern;Decreased stride length;Trunk flexed Gait velocity: decr   General Gait Details: Pivotal steps from bed > recliner and recliner <> stairs   Stairs Stairs: Yes Stairs assistance: Min assist;Mod assist Stair  Management: One rail Right Number of Stairs: 4 General stair comments: Pt holding onto R rail and L HHA. Cues provided for sequencing, technique. Pt requiring minA for ascent, modA for descent.   Wheelchair Mobility    Modified Rankin (Stroke Patients Only)       Balance Overall balance assessment: Needs assistance Sitting-balance support: Feet supported Sitting balance-Leahy Scale: Good     Standing balance support: Bilateral upper extremity supported Standing balance-Leahy Scale: Poor Standing balance comment: reliant on UE supports on the RW                            Cognition Arousal/Alertness: Awake/alert Behavior During Therapy: WFL for tasks assessed/performed Overall Cognitive Status: Within Functional Limits for tasks assessed                                        Exercises      General Comments        Pertinent Vitals/Pain Pain Assessment: Faces Faces Pain Scale: Hurts little more Pain Location: feet Pain Descriptors / Indicators: Sore;Tightness Pain Intervention(s): Monitored during session    Home Living                      Prior Function            PT Goals (current goals can now be found in the care plan  section) Acute Rehab PT Goals Patient Stated Goal: return home and go back to work PT Goal Formulation: With patient Time For Goal Achievement: 12/03/20 Potential to Achieve Goals: Good Progress towards PT goals: Progressing toward goals    Frequency    Min 3X/week      PT Plan Current plan remains appropriate    Co-evaluation              AM-PAC PT "6 Clicks" Mobility   Outcome Measure  Help needed turning from your back to your side while in a flat bed without using bedrails?: None Help needed moving from lying on your back to sitting on the side of a flat bed without using bedrails?: A Little Help needed moving to and from a bed to a chair (including a wheelchair)?: A Little Help  needed standing up from a chair using your arms (e.g., wheelchair or bedside chair)?: A Little Help needed to walk in hospital room?: A Little Help needed climbing 3-5 steps with a railing? : A Lot 6 Click Score: 18    End of Session Equipment Utilized During Treatment: Gait belt Activity Tolerance: Patient limited by fatigue Patient left: in chair;with call bell/phone within reach;with family/visitor present;with chair alarm set Nurse Communication: Mobility status PT Visit Diagnosis: Muscle weakness (generalized) (M62.81)     Time: 5732-2025 PT Time Calculation (min) (ACUTE ONLY): 24 min  Charges:  $Gait Training: 23-37 mins                     Wyona Almas, PT, DPT Acute Rehabilitation Services Pager [phone] Office [phone]    Deno Etienne 11/30/2020, 5:13 PM

## 2020-12-01 ENCOUNTER — Inpatient Hospital Stay (HOSPITAL_COMMUNITY): Payer: Medicare PPO

## 2020-12-01 DIAGNOSIS — M7989 Other specified soft tissue disorders: Secondary | ICD-10-CM | POA: Diagnosis not present

## 2020-12-01 DIAGNOSIS — J690 Pneumonitis due to inhalation of food and vomit: Secondary | ICD-10-CM | POA: Diagnosis not present

## 2020-12-01 DIAGNOSIS — N179 Acute kidney failure, unspecified: Secondary | ICD-10-CM | POA: Diagnosis not present

## 2020-12-01 DIAGNOSIS — J9601 Acute respiratory failure with hypoxia: Secondary | ICD-10-CM | POA: Diagnosis not present

## 2020-12-01 DIAGNOSIS — N400 Enlarged prostate without lower urinary tract symptoms: Secondary | ICD-10-CM

## 2020-12-01 DIAGNOSIS — K56609 Unspecified intestinal obstruction, unspecified as to partial versus complete obstruction: Secondary | ICD-10-CM | POA: Diagnosis not present

## 2020-12-01 LAB — COMPREHENSIVE METABOLIC PANEL
ALT: 54 U/L — ABNORMAL HIGH (ref 0–44)
AST: 19 U/L (ref 15–41)
Albumin: 2.2 g/dL — ABNORMAL LOW (ref 3.5–5.0)
Alkaline Phosphatase: 84 U/L (ref 38–126)
Anion gap: 8 (ref 5–15)
BUN: 13 mg/dL (ref 8–23)
CO2: 28 mmol/L (ref 22–32)
Calcium: 7.4 mg/dL — ABNORMAL LOW (ref 8.9–10.3)
Chloride: 99 mmol/L (ref 98–111)
Creatinine, Ser: 1.98 mg/dL — ABNORMAL HIGH (ref 0.61–1.24)
GFR, Estimated: 33 mL/min — ABNORMAL LOW (ref >60)
Glucose, Bld: 137 mg/dL — ABNORMAL HIGH (ref 70–99)
Potassium: 3.3 mmol/L — ABNORMAL LOW (ref 3.5–5.1)
Sodium: 135 mmol/L (ref 135–145)
Total Bilirubin: 0.6 mg/dL (ref 0.3–1.2)
Total Protein: 6.1 g/dL — ABNORMAL LOW (ref 6.5–8.1)

## 2020-12-01 MED ORDER — POTASSIUM CHLORIDE CRYS ER 20 MEQ PO TBCR
40.0000 meq | EXTENDED_RELEASE_TABLET | Freq: Once | ORAL | Status: AC
  Administered 2020-12-01: 40 meq via ORAL
  Filled 2020-12-01: qty 2

## 2020-12-01 MED ORDER — FUROSEMIDE 10 MG/ML IJ SOLN
40.0000 mg | Freq: Two times a day (BID) | INTRAMUSCULAR | Status: DC
  Administered 2020-12-01 – 2020-12-02 (×3): 40 mg via INTRAVENOUS
  Filled 2020-12-01 (×3): qty 4

## 2020-12-01 MED ORDER — DM-GUAIFENESIN ER 30-600 MG PO TB12: 1.0000 | ORAL_TABLET | Freq: Two times a day (BID) | ORAL | Status: DC

## 2020-12-01 MED ORDER — ACETAMINOPHEN 325 MG PO TABS: 650.0000 mg | ORAL_TABLET | Freq: Four times a day (QID) | ORAL | Status: DC | PRN

## 2020-12-01 MED ORDER — ALBUMIN HUMAN 25 % IV SOLN
25.0000 g | Freq: Four times a day (QID) | INTRAVENOUS | Status: AC
  Administered 2020-12-01 – 2020-12-02 (×4): 25 g via INTRAVENOUS
  Filled 2020-12-01 (×5): qty 100

## 2020-12-01 MED ORDER — GUAIFENESIN-DM 100-10 MG/5ML PO SYRP
10.0000 mL | ORAL_SOLUTION | Freq: Four times a day (QID) | ORAL | Status: DC
  Administered 2020-12-01 – 2020-12-05 (×15): 10 mL via ORAL
  Filled 2020-12-01 (×16): qty 10

## 2020-12-01 MED ORDER — FINASTERIDE 5 MG PO TABS
5.0000 mg | ORAL_TABLET | Freq: Every day | ORAL | Status: DC
  Administered 2020-12-01 – 2020-12-05 (×5): 5 mg via ORAL
  Filled 2020-12-01 (×5): qty 1

## 2020-12-01 MED ORDER — TAMSULOSIN HCL 0.4 MG PO CAPS
0.4000 mg | ORAL_CAPSULE | Freq: Every day | ORAL | Status: DC
  Administered 2020-12-01 – 2020-12-05 (×5): 0.4 mg via ORAL
  Filled 2020-12-01 (×5): qty 1

## 2020-12-01 NOTE — Progress Notes (Signed)
Upper extremity venous LT study completed.   Please see CV Proc for preliminary results.   Darlin Coco, RDMS, RVT

## 2020-12-01 NOTE — Progress Notes (Signed)
Occupational Therapy Treatment Patient Details Name: Victor Hunter MRN: [id number] DOB: [birth year] Today's Date: 12/01/2020    History of present illness Pt is an 87 y/o male admitted 3/15 for acute respiratory failure from PNA and SBO. Pt had NG tube upon presentation and it was taken out on 3/20. PMH includes SBO, HTN, and CAD.   OT comments  Pt in recliner upon arrival.making good progress with functional goals. Pt ambulated to bathroom for toilet transfers and standing at sink for grooming/hgygeine. Pt's daughter present and very supportive. OT will continue to follow acutely to maximize level of function and safety  Follow Up Recommendations  Home health OT;Supervision/Assistance - 24 hour    Equipment Recommendations  None recommended by OT    Recommendations for Other Services      Precautions / Restrictions Precautions Precautions: Fall Precaution Comments: monitor SpO2 Restrictions Weight Bearing Restrictions: No       Mobility Bed Mobility               General bed mobility comments: pt in recliner upon arrival    Transfers Overall transfer level: Needs assistance Equipment used: Rolling walker (2 wheeled) Transfers: Sit to/from Stand Sit to Stand: Min guard;Min assist              Balance Overall balance assessment: Needs assistance Sitting-balance support: Feet supported Sitting balance-Leahy Scale: Good     Standing balance support: Bilateral upper extremity supported;During functional activity Standing balance-Leahy Scale: Poor                             ADL either performed or assessed with clinical judgement   ADL Overall ADL's : (P) Needs assistance/impaired     Grooming: (P) Wash/dry hands;Wash/dry face;Oral care;Supervision/safety;Standing       Lower Body Bathing: (P) Minimal assistance;Sit to/from stand Lower Body Bathing Details (indicate cue type and reason): (P) simulated, cues for safety     Lower Body  Dressing: (P) Minimal assistance;Sit to/from stand   Toilet Transfer: (P) Minimal assistance;Ambulation;RW;Comfort height toilet;Grab bars;Cueing for safety   Toileting- Clothing Manipulation and Hygiene: (P) Min guard;Sit to/from stand       Functional mobility during ADLs: (P) Minimal assistance;Min guard;Rolling walker;Cueing for sequencing;Cueing for safety       Vision Patient Visual Report: No change from baseline     Perception     Praxis      Cognition Arousal/Alertness: Awake/alert Behavior During Therapy: WFL for tasks assessed/performed Overall Cognitive Status: Within Functional Limits for tasks assessed                                          Exercises     Shoulder Instructions       General Comments      Pertinent Vitals/ Pain       Pain Assessment: 0-10 Pain Score: 5  Pain Location: feet Pain Descriptors / Indicators: Sore;Tightness Pain Intervention(s): Repositioned  Home Living                                          Prior Functioning/Environment              Frequency  Min 2X/week        Progress  Toward Goals  OT Goals(current goals can now be found in the care plan section)  Progress towards OT goals: Progressing toward goals  Acute Rehab OT Goals Patient Stated Goal: return home and go back to work  Plan Discharge plan remains appropriate    Co-evaluation                 AM-PAC OT "6 Clicks" Daily Activity     Outcome Measure   Help from another person eating meals?: None Help from another person taking care of personal grooming?: A Little Help from another person toileting, which includes using toliet, bedpan, or urinal?: A Little Help from another person bathing (including washing, rinsing, drying)?: A Lot Help from another person to put on and taking off regular upper body clothing?: A Little Help from another person to put on and taking off regular lower body clothing?: A  Lot 6 Click Score: 17    End of Session Equipment Utilized During Treatment: Rolling walker;Gait belt  OT Visit Diagnosis: Unsteadiness on feet (R26.81);Muscle weakness (generalized) (M62.81)   Activity Tolerance Patient tolerated treatment well   Patient Left in chair;with call bell/phone within reach;with family/visitor present   Nurse Communication          Time: 7341-9379 OT Time Calculation (min): 29 min  Charges: OT General Charges $OT Visit: 1 Visit OT Treatments $Self Care/Home Management : 8-22 mins $Therapeutic Activity: 8-22 mins    Britt Bottom 12/01/2020, 3:08 PM

## 2020-12-01 NOTE — Progress Notes (Signed)
PROGRESS NOTE    Victor Hunter  ZOX:[id number] DOB: August 29, 1934 DOA: 11/17/2020 PCP: Rosita Fire, MD     Brief Narrative:  Victor Hunter is an 87 y.o. male with medical history significant for hypertension, esophagitis, BPH who presented to the hospital with chief complaint of shortness of breath, constipation, abdominal distention.  On arrival of EMS, patient was noted to be hypoxic with oxygen saturation of 78% on room air, required up to 15 L high flow nasal cannula O2 on arrival to the emergency department.  Work-up revealed small bowel obstruction with transition in the distal jejunum, bibasilar airspace disease greatest in the left lower lobe.  NG tube was placed, general surgery consulted for SBO.  Patient was also started on IV antibiotic for possible aspiration pneumonia.  Hospitalization further complicated by fluid overload and AKI.  New events last 24 hours / Subjective: Complains of pain in his legs and left forearm. Reports good urine output. He had a cough overnight that made it difficult to sleep  Assessment & Plan:   Principal Problem:   Small bowel obstruction (HCC) Active Problems:   Aspiration pneumonia (HCC)   Hypokalemia   AKI (acute kidney injury) ([place])   Leukocytosis   Elevated troponin   Hypothyroidism   Sepsis (HCC)   Gastroesophageal reflux disease with hiatal hernia   BPH (benign prostatic hyperplasia)   Essential hypertension   Flash pulmonary edema (HCC)   Elevated brain natriuretic peptide (BNP) level   Elevated d-dimer   SBO -Resolved and tolerating diet now.  General surgery has signed off  AKI on CKD stage IIIb -Baseline creatinine 1.5 -Hold lisinopril thiazide -Renal ultrasound showed small left renal cyst without focal abnormality noted -Slowly improving.  Continue to monitor while getting IV diuretic  Acute on chronic diastolic heart failure/Anasarca -Has been receiving IV fluid during hospitalization with SBO, AKI.  Now with  some shortness of breath, peripheral edema, chest x-ray reviewed independently with perihilar airspace disease -Echocardiogram 11/17/2020 with EF 55 to 60% -Continue IV Lasix, continue 2 twice daily, patient remains fluid overloaded -will add albumin to assist with diuresis  Aspiration pneumonia -Zosyn --> Augmentin, last dose 3/29  -continues to have cough and had fever overnight -check chest xray  Hypertension -Lisinopril, thiazide on hold due to AKI, amlodipine on hold due to edema -Continue Cardizem -Blood pressure stable  Severe hiccups -Continue Thorazine as needed  Demand ischemia -Cardiology signed off 3/19 -Echocardiogram without wall motion abnormality, no ischemic work-up necessary.  IV heparin was discontinued  History of RUE DVT in 2013 -No longer on anticoagulation  Hypothyroidism -Continue Synthroid  Elevated LFT -RUQ [place]: no biliary dilatation, status post cholecystectomy -Improving  Left forearm swelling -At site of previous IV infiltration -no DVT on venous dopplers, there was evidence of superficial thrombosis  BPH -restart finasteride and tamsulosin   DVT prophylaxis:  enoxaparin (LOVENOX) injection 30 mg Start: 11/26/20 1000 SCDs Start: 11/17/20 0538  Code Status: Full code Family Communication: At bedside Disposition Plan:  Status is: Inpatient  Remains inpatient appropriate because:IV treatments appropriate due to intensity of illness or inability to take PO   Dispo: The patient is from: Home              Anticipated d/c is to: Home              Patient currently is not medically stable to d/c.  Continue IV Lasix, remains fluid overloaded.  Home health on discharge, likely 2 to 3 days.  Difficult to place patient No  Consultants:   General surgery  Cardiology  Antimicrobials:  Anti-infectives (From admission, onward)   Start     Dose/Rate Route Frequency Ordered Stop   11/25/20 2200  piperacillin-tazobactam (ZOSYN) IVPB 3.375 g   Status:  Discontinued        3.375 g 12.5 mL/hr over 240 Minutes Intravenous Every 12 hours 11/25/20 1039 11/25/20 1055   11/25/20 2000  amoxicillin-clavulanate (AUGMENTIN) 500-125 MG per tablet 500 mg        1 tablet Oral 2 times daily 11/25/20 1109 11/30/20 2024   11/25/20 1145  amoxicillin-clavulanate (AUGMENTIN) 875-125 MG per tablet 1 tablet  Status:  Discontinued        1 tablet Oral Every 12 hours 11/25/20 1055 11/25/20 1108   11/22/20 1130  piperacillin-tazobactam (ZOSYN) IVPB 3.375 g  Status:  Discontinued        3.375 g 12.5 mL/hr over 240 Minutes Intravenous Every 8 hours 11/22/20 1022 11/25/20 1039   11/20/20 1200  piperacillin-tazobactam (ZOSYN) IVPB 3.375 g  Status:  Discontinued        3.375 g 12.5 mL/hr over 240 Minutes Intravenous Every 8 hours 11/20/20 1034 11/21/20 0832   11/19/20 1100  vancomycin (VANCOREADY) IVPB 500 mg/100 mL  Status:  Discontinued        500 mg 100 mL/hr over 60 Minutes Intravenous Every 24 hours 11/18/20 1120 11/19/20 1755   11/18/20 1000  vancomycin (VANCOREADY) IVPB 1500 mg/300 mL        1,500 mg 150 mL/hr over 120 Minutes Intravenous Every 48 hours 11/17/20 0845 11/18/20 1145   11/17/20 1200  piperacillin-tazobactam (ZOSYN) IVPB 3.375 g  Status:  Discontinued        3.375 g 12.5 mL/hr over 240 Minutes Intravenous Every 8 hours 11/17/20 0651 11/20/20 1034   11/17/20 0445  vancomycin (VANCOCIN) IVPB 1000 mg/200 mL premix        1,000 mg 200 mL/hr over 60 Minutes Intravenous  Once 11/17/20 0439 11/17/20 0727   11/17/20 0445  piperacillin-tazobactam (ZOSYN) IVPB 3.375 g        3.375 g 100 mL/hr over 30 Minutes Intravenous  Once 11/17/20 0439 11/17/20 0555       Objective: Vitals:   11/30/20 2124 12/01/20 0437 12/01/20 0550 12/01/20 1321  BP:  (!) 107/45 134/62 (!) 130/59  Pulse:  77  76  Resp:  16  20  Temp: 99.6 F (37.6 C) 98.8 F (37.1 C)  98.4 F (36.9 C)  TempSrc: Oral Oral  Oral  SpO2:  98%  99%  Weight:      Height:         Intake/Output Summary (Last 24 hours) at 12/01/2020 1624 Last data filed at 12/01/2020 1300 Gross per 24 hour  Intake 820 ml  Output 800 ml  Net 20 ml   Filed Weights   11/22/20 0349 11/25/20 0500 11/28/20 0438  Weight: 78.2 kg 79 kg 78.3 kg    Examination: General exam: Alert, awake, oriented x 3 Respiratory system: Clear to auscultation. Respiratory effort normal. Cardiovascular system:RRR. No murmurs, rubs, gallops. Gastrointestinal system: Abdomen is nondistended, soft and nontender. No organomegaly or masses felt. Normal bowel sounds heard. Central nervous system: Alert and oriented. No focal neurological deficits. Extremities: 2+ edema in upper and lower extremities Skin: No rashes, lesions or ulcers Psychiatry: Judgement and insight appear normal. Mood & affect appropriate.     Data Reviewed: I have personally reviewed following labs and imaging studies  CBC: Recent  Labs  Lab 11/28/20 0108 11/29/20 1048  WBC 11.8* 10.3  HGB 9.9* 9.6*  HCT 31.1* 30.4*  MCV 81.2 80.9  PLT 238 161   Basic Metabolic Panel: Recent Labs  Lab 11/26/20 0314 11/27/20 0328 11/28/20 0108 11/29/20 1048 11/30/20 0133 12/01/20 0102  NA 135 135 135 137 136 135  K 3.1* 4.1 3.9 3.8 3.5 3.3*  CL 102 104 104 104 101 99  CO2 23 23 24 26 26 28   GLUCOSE 136* 135* 144* 144* 119* 137*  BUN 21 18 14 13 11 13   CREATININE 2.10* 2.07* 1.95* 2.04* 1.83* 1.98*  CALCIUM 7.6* 7.6* 7.5* 7.5* 7.4* 7.4*  MG 1.9 1.7  --   --   --   --    GFR: Estimated Creatinine Clearance: 25.7 mL/min (A) (by C-G formula based on SCr of 1.98 mg/dL (H)). Liver Function Tests: Recent Labs  Lab 11/27/20 0328 11/28/20 0108 11/29/20 1048 11/30/20 0133 12/01/20 0102  AST 65* 51* 26 19 19   ALT 113* 108* 80* 64* 54*  ALKPHOS 75 80 80 74 84  BILITOT 0.3 0.3 0.5 0.5 0.6  PROT 5.6* 5.5* 5.7* 5.6* 6.1*  ALBUMIN 2.3* 2.3* 2.3* 2.1* 2.2*   No results for input(s): LIPASE, AMYLASE in the last 168 hours. No results  for input(s): AMMONIA in the last 168 hours. Coagulation Profile: No results for input(s): INR, PROTIME in the last 168 hours. Cardiac Enzymes: No results for input(s): CKTOTAL, CKMB, CKMBINDEX, TROPONINI in the last 168 hours. BNP (last 3 results) No results for input(s): PROBNP in the last 8760 hours. HbA1C: No results for input(s): HGBA1C in the last 72 hours. CBG: No results for input(s): GLUCAP in the last 168 hours. Lipid Profile: No results for input(s): CHOL, HDL, LDLCALC, TRIG, CHOLHDL, LDLDIRECT in the last 72 hours. Thyroid Function Tests: No results for input(s): TSH, T4TOTAL, FREET4, T3FREE, THYROIDAB in the last 72 hours. Anemia Panel: No results for input(s): VITAMINB12, FOLATE, FERRITIN, TIBC, IRON, RETICCTPCT in the last 72 hours. Sepsis Labs: No results for input(s): PROCALCITON, LATICACIDVEN in the last 168 hours.  No results found for this or any previous visit (from the past 240 hour(s)).    Radiology Studies: VAS [place] UPPER EXTREMITY VENOUS DUPLEX  Result Date: 12/01/2020 UPPER VENOUS STUDY  Indications: Swelling and redness at IV site Comparison Study: No prior studies. Performing Technologist: Darlin Coco RDMS,RVT  Examination Guidelines: A complete evaluation includes B-mode imaging, spectral Doppler, color Doppler, and power Doppler as needed of all accessible portions of each vessel. Bilateral testing is considered an integral part of a complete examination. Limited examinations for reoccurring indications may be performed as noted.  Right Findings: +----------+------------+---------+-----------+----------+-------+ RIGHT     CompressiblePhasicitySpontaneousPropertiesSummary +----------+------------+---------+-----------+----------+-------+ Subclavian    Full       Yes       Yes                      +----------+------------+---------+-----------+----------+-------+  Left Findings: +----------+------------+---------+-----------+----------+-------+ LEFT       CompressiblePhasicitySpontaneousPropertiesSummary +----------+------------+---------+-----------+----------+-------+ IJV           Full       Yes       Yes                      +----------+------------+---------+-----------+----------+-------+ Subclavian    Full       Yes       Yes                      +----------+------------+---------+-----------+----------+-------+  Axillary      Full       Yes       Yes                      +----------+------------+---------+-----------+----------+-------+ Brachial      Full                                          +----------+------------+---------+-----------+----------+-------+ Radial        Full                                          +----------+------------+---------+-----------+----------+-------+ Ulnar         Full                                          +----------+------------+---------+-----------+----------+-------+ Cephalic      None       No        No                Acute  +----------+------------+---------+-----------+----------+-------+ Basilic       Full                                          +----------+------------+---------+-----------+----------+-------+  Summary:  Right: No evidence of thrombosis in the subclavian.  Left: No evidence of deep vein thrombosis in the upper extremity. Findings consistent with acute superficial vein thrombosis involving the left cephalic vein.  Incidental: Appearance of edematous tendon thickening at the lateral (radial) aspect of the wrist.  *See table(s) above for measurements and observations.    Preliminary       Scheduled Meds: . Chlorhexidine Gluconate Cloth  6 each Topical Daily  . dextromethorphan-guaiFENesin  1 tablet Oral BID  . diltiazem  180 mg Oral Daily  . enoxaparin (LOVENOX) injection  30 mg Subcutaneous Daily  . feeding supplement  237 mL Oral BID BM  . finasteride  5 mg Oral Daily  . furosemide  40 mg Intravenous BID  .  levothyroxine  112 mcg Oral Q0600  . pantoprazole  40 mg Oral Q12H  . potassium chloride  40 mEq Oral Once  . tamsulosin  0.4 mg Oral Daily   Continuous Infusions: . albumin human 25 g (12/01/20 1229)  . chlorproMAZINE (THORAZINE) IV       LOS: 14 days      Time spent: 30 minutes   Kathie Dike, MD [facility] 12/01/2020, 4:24 PM   Available via Epic secure chat 7am-7pm After these hours, please refer to coverage provider listed on [email or web address]

## 2020-12-02 DIAGNOSIS — N179 Acute kidney failure, unspecified: Secondary | ICD-10-CM | POA: Diagnosis not present

## 2020-12-02 DIAGNOSIS — J690 Pneumonitis due to inhalation of food and vomit: Secondary | ICD-10-CM | POA: Diagnosis not present

## 2020-12-02 DIAGNOSIS — J9601 Acute respiratory failure with hypoxia: Secondary | ICD-10-CM | POA: Diagnosis not present

## 2020-12-02 DIAGNOSIS — K56609 Unspecified intestinal obstruction, unspecified as to partial versus complete obstruction: Secondary | ICD-10-CM | POA: Diagnosis not present

## 2020-12-02 LAB — RENAL FUNCTION PANEL
Albumin: 3.2 g/dL — ABNORMAL LOW (ref 3.5–5.0)
Anion gap: 8 (ref 5–15)
BUN: 11 mg/dL (ref 8–23)
CO2: 29 mmol/L (ref 22–32)
Calcium: 7.6 mg/dL — ABNORMAL LOW (ref 8.9–10.3)
Chloride: 99 mmol/L (ref 98–111)
Creatinine, Ser: 1.89 mg/dL — ABNORMAL HIGH (ref 0.61–1.24)
GFR, Estimated: 35 mL/min — ABNORMAL LOW (ref >60)
Glucose, Bld: 110 mg/dL — ABNORMAL HIGH (ref 70–99)
Phosphorus: 3.3 mg/dL (ref 2.5–4.6)
Potassium: 3.5 mmol/L (ref 3.5–5.1)
Sodium: 136 mmol/L (ref 135–145)

## 2020-12-02 MED ORDER — FUROSEMIDE 10 MG/ML IJ SOLN
60.0000 mg | Freq: Two times a day (BID) | INTRAMUSCULAR | Status: DC
  Administered 2020-12-03 – 2020-12-04 (×3): 60 mg via INTRAVENOUS
  Filled 2020-12-02 (×3): qty 6

## 2020-12-02 NOTE — Progress Notes (Signed)
PROGRESS NOTE    Victor Hunter  NOM:[id number] DOB: [birth year] DOA: 11/17/2020 PCP: Rosita Fire, MD     Brief Narrative:  Victor Hunter is an 87 y.o. male with medical history significant for hypertension, esophagitis, BPH who presented to the hospital with chief complaint of shortness of breath, constipation, abdominal distention.  On arrival of EMS, patient was noted to be hypoxic with oxygen saturation of 78% on room air, required up to 15 L high flow nasal cannula O2 on arrival to the emergency department.  Work-up revealed small bowel obstruction with transition in the distal jejunum, bibasilar airspace disease greatest in the left lower lobe.  NG tube was placed, general surgery consulted for SBO.  Patient was also started on IV antibiotic for possible aspiration pneumonia.  Hospitalization further complicated by fluid overload and AKI.  New events last 24 hours / Subjective: Continues to have some pain in feet bilaterally. Continues to have bilateral upper and lower extremity swelling  Assessment & Plan:   Principal Problem:   Small bowel obstruction (HCC) Active Problems:   Aspiration pneumonia (HCC)   Hypokalemia   AKI (acute kidney injury) ([place])   Leukocytosis   Elevated troponin   Hypothyroidism   Sepsis (HCC)   Gastroesophageal reflux disease with hiatal hernia   BPH (benign prostatic hyperplasia)   Essential hypertension   Flash pulmonary edema (HCC)   Elevated brain natriuretic peptide (BNP) level   Elevated d-dimer   SBO -Resolved and tolerating diet now.  General surgery has signed off  AKI on CKD stage IIIb -Baseline creatinine 1.5 -Hold lisinopril thiazide -Renal ultrasound showed small left renal cyst without focal abnormality noted -Slowly improving.  Continue to monitor while getting IV diuretic  Acute on chronic diastolic heart failure/Anasarca -Has been receiving IV fluid during hospitalization with SBO, AKI.  Now with some shortness of  breath, peripheral edema, chest x-ray reviewed independently with perihilar airspace disease -Echocardiogram 11/17/2020 with EF 55 to 60% -Continue IV Lasix, continue 2 twice daily, patient remains fluid overloaded -he received albumin to help with diuresis  Aspiration pneumonia -Zosyn --> Augmentin, last dose 3/29  -continues to have cough and had fever overnight -chest xray from 3/30 shows improving infiltrates  Hypertension -Lisinopril, thiazide on hold due to AKI, amlodipine on hold due to edema -Continue Cardizem -Blood pressure stable  Severe hiccups -Continue Thorazine as needed  Demand ischemia -Cardiology signed off 3/19 -Echocardiogram without wall motion abnormality, no ischemic work-up necessary.  IV heparin was discontinued  History of RUE DVT in 2013 -No longer on anticoagulation  Hypothyroidism -Continue Synthroid  Elevated LFT -RUQ [place]: no biliary dilatation, status post cholecystectomy -Improving  Left forearm swelling -At site of previous IV infiltration -no DVT on venous dopplers, there was evidence of superficial thrombosis  BPH -continue on finasteride and tamsulosin   DVT prophylaxis:  enoxaparin (LOVENOX) injection 30 mg Start: 11/26/20 1000 SCDs Start: 11/17/20 0538  Code Status: Full code Family Communication: At bedside Disposition Plan:  Status is: Inpatient  Remains inpatient appropriate because:IV treatments appropriate due to intensity of illness or inability to take PO   Dispo: The patient is from: Home              Anticipated d/c is to: Home              Patient currently is not medically stable to d/c.  Continue IV Lasix, remains fluid overloaded.  Home health on discharge, likely 2 to 3 days.   Difficult  to place patient No  Consultants:   General surgery  Cardiology  Antimicrobials:  Anti-infectives (From admission, onward)   Start     Dose/Rate Route Frequency Ordered Stop   11/25/20 2200  piperacillin-tazobactam  (ZOSYN) IVPB 3.375 g  Status:  Discontinued        3.375 g 12.5 mL/hr over 240 Minutes Intravenous Every 12 hours 11/25/20 1039 11/25/20 1055   11/25/20 2000  amoxicillin-clavulanate (AUGMENTIN) 500-125 MG per tablet 500 mg        1 tablet Oral 2 times daily 11/25/20 1109 11/30/20 2024   11/25/20 1145  amoxicillin-clavulanate (AUGMENTIN) 875-125 MG per tablet 1 tablet  Status:  Discontinued        1 tablet Oral Every 12 hours 11/25/20 1055 11/25/20 1108   11/22/20 1130  piperacillin-tazobactam (ZOSYN) IVPB 3.375 g  Status:  Discontinued        3.375 g 12.5 mL/hr over 240 Minutes Intravenous Every 8 hours 11/22/20 1022 11/25/20 1039   11/20/20 1200  piperacillin-tazobactam (ZOSYN) IVPB 3.375 g  Status:  Discontinued        3.375 g 12.5 mL/hr over 240 Minutes Intravenous Every 8 hours 11/20/20 1034 11/21/20 0832   11/19/20 1100  vancomycin (VANCOREADY) IVPB 500 mg/100 mL  Status:  Discontinued        500 mg 100 mL/hr over 60 Minutes Intravenous Every 24 hours 11/18/20 1120 11/19/20 1755   11/18/20 1000  vancomycin (VANCOREADY) IVPB 1500 mg/300 mL        1,500 mg 150 mL/hr over 120 Minutes Intravenous Every 48 hours 11/17/20 0845 11/18/20 1145   11/17/20 1200  piperacillin-tazobactam (ZOSYN) IVPB 3.375 g  Status:  Discontinued        3.375 g 12.5 mL/hr over 240 Minutes Intravenous Every 8 hours 11/17/20 0651 11/20/20 1034   11/17/20 0445  vancomycin (VANCOCIN) IVPB 1000 mg/200 mL premix        1,000 mg 200 mL/hr over 60 Minutes Intravenous  Once 11/17/20 0439 11/17/20 0727   11/17/20 0445  piperacillin-tazobactam (ZOSYN) IVPB 3.375 g        3.375 g 100 mL/hr over 30 Minutes Intravenous  Once 11/17/20 0439 11/17/20 0555       Objective: Vitals:   12/01/20 1321 12/01/20 2119 12/02/20 0504 12/02/20 1451  BP: (!) 130/59 (!) 128/56 (!) 112/59 (!) 143/56  Pulse: 76 81 64 79  Resp: 20 16 16 17   Temp: 98.4 F (36.9 C) 99 F (37.2 C) 98.4 F (36.9 C) 99 F (37.2 C)  TempSrc: Oral Oral  Oral Oral  SpO2: 99% 97% 100% 99%  Weight:      Height:        Intake/Output Summary (Last 24 hours) at 12/02/2020 1832 Last data filed at 12/02/2020 1755 Gross per 24 hour  Intake 240 ml  Output 875 ml  Net -635 ml   Filed Weights   11/22/20 0349 11/25/20 0500 11/28/20 0438  Weight: 78.2 kg 79 kg 78.3 kg    Examination: General exam: Alert, awake, oriented x 3 Respiratory system: Clear to auscultation. Respiratory effort normal. Cardiovascular system:RRR. No murmurs, rubs, gallops. Gastrointestinal system: Abdomen is nondistended, soft and nontender. No organomegaly or masses felt. Normal bowel sounds heard. Central nervous system: Alert and oriented. No focal neurological deficits. Extremities: 2+ edema in upper and lower extremities Skin: No rashes, lesions or ulcers Psychiatry: Judgement and insight appear normal. Mood & affect appropriate.     Data Reviewed: I have personally reviewed following labs and imaging  studies  CBC: Recent Labs  Lab 11/28/20 0108 11/29/20 1048  WBC 11.8* 10.3  HGB 9.9* 9.6*  HCT 31.1* 30.4*  MCV 81.2 80.9  PLT 238 098   Basic Metabolic Panel: Recent Labs  Lab 11/26/20 0314 11/27/20 0328 11/28/20 0108 11/29/20 1048 11/30/20 0133 12/01/20 0102 12/02/20 0158  NA 135 135 135 137 136 135 136  K 3.1* 4.1 3.9 3.8 3.5 3.3* 3.5  CL 102 104 104 104 101 99 99  CO2 23 23 24 26 26 28 29   GLUCOSE 136* 135* 144* 144* 119* 137* 110*  BUN 21 18 14 13 11 13 11   CREATININE 2.10* 2.07* 1.95* 2.04* 1.83* 1.98* 1.89*  CALCIUM 7.6* 7.6* 7.5* 7.5* 7.4* 7.4* 7.6*  MG 1.9 1.7  --   --   --   --   --   PHOS  --   --   --   --   --   --  3.3   GFR: Estimated Creatinine Clearance: 27 mL/min (A) (by C-G formula based on SCr of 1.89 mg/dL (H)). Liver Function Tests: Recent Labs  Lab 11/27/20 0328 11/28/20 0108 11/29/20 1048 11/30/20 0133 12/01/20 0102 12/02/20 0158  AST 65* 51* 26 19 19   --   ALT 113* 108* 80* 64* 54*  --   ALKPHOS 75 80 80  74 84  --   BILITOT 0.3 0.3 0.5 0.5 0.6  --   PROT 5.6* 5.5* 5.7* 5.6* 6.1*  --   ALBUMIN 2.3* 2.3* 2.3* 2.1* 2.2* 3.2*   No results for input(s): LIPASE, AMYLASE in the last 168 hours. No results for input(s): AMMONIA in the last 168 hours. Coagulation Profile: No results for input(s): INR, PROTIME in the last 168 hours. Cardiac Enzymes: No results for input(s): CKTOTAL, CKMB, CKMBINDEX, TROPONINI in the last 168 hours. BNP (last 3 results) No results for input(s): PROBNP in the last 8760 hours. HbA1C: No results for input(s): HGBA1C in the last 72 hours. CBG: No results for input(s): GLUCAP in the last 168 hours. Lipid Profile: No results for input(s): CHOL, HDL, LDLCALC, TRIG, CHOLHDL, LDLDIRECT in the last 72 hours. Thyroid Function Tests: No results for input(s): TSH, T4TOTAL, FREET4, T3FREE, THYROIDAB in the last 72 hours. Anemia Panel: No results for input(s): VITAMINB12, FOLATE, FERRITIN, TIBC, IRON, RETICCTPCT in the last 72 hours. Sepsis Labs: No results for input(s): PROCALCITON, LATICACIDVEN in the last 168 hours.  No results found for this or any previous visit (from the past 240 hour(s)).    Radiology Studies: DG CHEST PORT 1 VIEW  Result Date: 12/01/2020 CLINICAL DATA:  Cough EXAM: PORTABLE CHEST 1 VIEW COMPARISON:  11/25/2020, CT 09/09/2008 FINDINGS: Postsurgical changes at the thoracic inlet. Cardiomegaly with decreased bilateral ground-glass opacities compared to prior. No consolidation or pleural effusion. No pneumothorax. IMPRESSION: Cardiomegaly with decreased bilateral ground-glass opacities/probable edema compared to prior. Electronically Signed   By: Donavan Foil M.D.   On: 12/01/2020 18:09   VAS [place] UPPER EXTREMITY VENOUS DUPLEX  Result Date: 12/01/2020 UPPER VENOUS STUDY  Indications: Swelling and redness at IV site Comparison Study: No prior studies. Performing Technologist: Darlin Coco RDMS,RVT  Examination Guidelines: A complete evaluation includes  B-mode imaging, spectral Doppler, color Doppler, and power Doppler as needed of all accessible portions of each vessel. Bilateral testing is considered an integral part of a complete examination. Limited examinations for reoccurring indications may be performed as noted.  Right Findings: +----------+------------+---------+-----------+----------+-------+ RIGHT     CompressiblePhasicitySpontaneousPropertiesSummary +----------+------------+---------+-----------+----------+-------+ Subclavian  Full       Yes       Yes                      +----------+------------+---------+-----------+----------+-------+  Left Findings: +----------+------------+---------+-----------+----------+-------+ LEFT      CompressiblePhasicitySpontaneousPropertiesSummary +----------+------------+---------+-----------+----------+-------+ IJV           Full       Yes       Yes                      +----------+------------+---------+-----------+----------+-------+ Subclavian    Full       Yes       Yes                      +----------+------------+---------+-----------+----------+-------+ Axillary      Full       Yes       Yes                      +----------+------------+---------+-----------+----------+-------+ Brachial      Full                                          +----------+------------+---------+-----------+----------+-------+ Radial        Full                                          +----------+------------+---------+-----------+----------+-------+ Ulnar         Full                                          +----------+------------+---------+-----------+----------+-------+ Cephalic      None       No        No                Acute  +----------+------------+---------+-----------+----------+-------+ Basilic       Full                                          +----------+------------+---------+-----------+----------+-------+  Summary:  Right: No evidence of  thrombosis in the subclavian.  Left: No evidence of deep vein thrombosis in the upper extremity. Findings consistent with acute superficial vein thrombosis involving the left cephalic vein.  Incidental: Appearance of edematous tendon thickening at the lateral (radial) aspect of the wrist.  *See table(s) above for measurements and observations.  Diagnosing physician: Harold Barban MD Electronically signed by Harold Barban MD on 12/01/2020 at 10:42:58 PM.    Final       Scheduled Meds: . Chlorhexidine Gluconate Cloth  6 each Topical Daily  . diltiazem  180 mg Oral Daily  . enoxaparin (LOVENOX) injection  30 mg Subcutaneous Daily  . feeding supplement  237 mL Oral BID BM  . finasteride  5 mg Oral Daily  . furosemide  40 mg Intravenous BID  . guaiFENesin-dextromethorphan  10 mL Oral Q6H  . levothyroxine  112 mcg Oral Q0600  . pantoprazole  40 mg Oral Q12H  . tamsulosin  0.4 mg Oral Daily   Continuous Infusions: .  chlorproMAZINE (THORAZINE) IV 25 mg (12/02/20 1559)     LOS: 15 days      Time spent: 30 minutes   Kathie Dike, MD [facility] 12/02/2020, 6:32 PM   Available via Epic secure chat 7am-7pm After these hours, please refer to coverage provider listed on [email or web address]

## 2020-12-02 NOTE — Progress Notes (Signed)
Physical Therapy Treatment Patient Details Name: Victor Hunter MRN: [id number] DOB: [birth year] Today's Date: 12/02/2020    History of Present Illness Pt is an 87 y/o male admitted 3/15 for acute respiratory failure from PNA and SBO. Pt had NG tube upon presentation and it was taken out on 3/20. PMH includes SBO, HTN, and CAD.    PT Comments    Pt up in chair and agreeable to mobility. Pt ambulated in hallway with use of RW and close guard for safety, pt requiring multiple cues for form and safety throughout. PT instructed pt in LE exercises, which pt tolerated well with cues and rest as needed. SPO1 91-97% on RA throughout session.    Follow Up Recommendations  Home health PT;Supervision for mobility/OOB     Equipment Recommendations  None recommended by PT    Recommendations for Other Services       Precautions / Restrictions Precautions Precautions: Fall Precaution Comments: monitor SpO2 Restrictions Weight Bearing Restrictions: No    Mobility  Bed Mobility Overal bed mobility: Needs Assistance             General bed mobility comments: pt in recliner upon arrival    Transfers Overall transfer level: Needs assistance Equipment used: Rolling walker (2 wheeled) Transfers: Sit to/from Stand Sit to Stand: Min assist         General transfer comment: Min assist for power up, initial rise, pt self-steadied upon standing.  Ambulation/Gait Ambulation/Gait assistance: Min guard Gait Distance (Feet): 50 Feet Assistive device: Rolling walker (2 wheeled) Gait Pattern/deviations: Step-through pattern;Decreased stride length;Trunk flexed Gait velocity: decr   General Gait Details: min guard for safety, verbal cuing for upright posture, placement in RW. SpO2 91-97% on RA during mobility, DOE 2/4.   Stairs             Wheelchair Mobility    Modified Rankin (Stroke Patients Only)       Balance Overall balance assessment: Needs  assistance Sitting-balance support: Feet supported Sitting balance-Leahy Scale: Good     Standing balance support: Bilateral upper extremity supported;During functional activity Standing balance-Leahy Scale: Poor                              Cognition Arousal/Alertness: Awake/alert Behavior During Therapy: WFL for tasks assessed/performed Overall Cognitive Status: Within Functional Limits for tasks assessed                                        Exercises General Exercises - Lower Extremity Long Arc Quad: AROM;Both;15 reps;Seated Hip Flexion/Marching: AROM;Both;15 reps;Seated    General Comments        Pertinent Vitals/Pain Pain Assessment: Faces Faces Pain Scale: Hurts a little bit Pain Location: feet Pain Descriptors / Indicators: Sore;Tightness Pain Intervention(s): Limited activity within patient's tolerance;Monitored during session;Repositioned    Home Living                      Prior Function            PT Goals (current goals can now be found in the care plan section) Acute Rehab PT Goals Patient Stated Goal: return home and go back to work PT Goal Formulation: With patient Time For Goal Achievement: 12/03/20 Potential to Achieve Goals: Good Progress towards PT goals: Progressing toward goals    Frequency  Min 3X/week      PT Plan Current plan remains appropriate    Co-evaluation              AM-PAC PT "6 Clicks" Mobility   Outcome Measure  Help needed turning from your back to your side while in a flat bed without using bedrails?: None Help needed moving from lying on your back to sitting on the side of a flat bed without using bedrails?: A Little Help needed moving to and from a bed to a chair (including a wheelchair)?: A Little Help needed standing up from a chair using your arms (e.g., wheelchair or bedside chair)?: A Little Help needed to walk in hospital room?: A Little Help needed climbing  3-5 steps with a railing? : A Lot 6 Click Score: 18    End of Session Equipment Utilized During Treatment: Gait belt Activity Tolerance: Patient limited by fatigue Patient left: in chair;with call bell/phone within reach;with family/visitor present;with chair alarm set Nurse Communication: Mobility status PT Visit Diagnosis: Muscle weakness (generalized) (M62.81);Difficulty in walking, not elsewhere classified (R26.2)     Time: 9381-8299 PT Time Calculation (min) (ACUTE ONLY): 16 min  Charges:  $Gait Training: 8-22 mins                     Stacie Glaze, PT Acute Rehabilitation Services Pager [phone]  Office [phone]   [place] 12/02/2020, 5:06 PM

## 2020-12-03 DIAGNOSIS — N179 Acute kidney failure, unspecified: Secondary | ICD-10-CM | POA: Diagnosis not present

## 2020-12-03 DIAGNOSIS — J9601 Acute respiratory failure with hypoxia: Secondary | ICD-10-CM | POA: Diagnosis not present

## 2020-12-03 DIAGNOSIS — K56609 Unspecified intestinal obstruction, unspecified as to partial versus complete obstruction: Secondary | ICD-10-CM | POA: Diagnosis not present

## 2020-12-03 DIAGNOSIS — J690 Pneumonitis due to inhalation of food and vomit: Secondary | ICD-10-CM | POA: Diagnosis not present

## 2020-12-03 LAB — URINALYSIS, ROUTINE W REFLEX MICROSCOPIC
Bilirubin Urine: NEGATIVE
Glucose, UA: NEGATIVE mg/dL
Ketones, ur: NEGATIVE mg/dL
Leukocytes,Ua: NEGATIVE
Nitrite: NEGATIVE
Protein, ur: NEGATIVE mg/dL
Specific Gravity, Urine: 1.004 — ABNORMAL LOW (ref 1.005–1.030)
pH: 5 (ref 5.0–8.0)

## 2020-12-03 LAB — BLOOD GAS, ARTERIAL
Acid-Base Excess: 4.5 mmol/L — ABNORMAL HIGH (ref 0.0–2.0)
Bicarbonate: 28.2 mmol/L — ABNORMAL HIGH (ref 20.0–28.0)
Drawn by: 164
FIO2: 21
O2 Saturation: 96 %
Patient temperature: 37.1
pCO2 arterial: 39.6 mmHg (ref 32.0–48.0)
pH, Arterial: 7.466 — ABNORMAL HIGH (ref 7.350–7.450)
pO2, Arterial: 78.5 mmHg — ABNORMAL LOW (ref 83.0–108.0)

## 2020-12-03 LAB — BASIC METABOLIC PANEL
Anion gap: 8 (ref 5–15)
BUN: 13 mg/dL (ref 8–23)
CO2: 30 mmol/L (ref 22–32)
Calcium: 7.1 mg/dL — ABNORMAL LOW (ref 8.9–10.3)
Chloride: 96 mmol/L — ABNORMAL LOW (ref 98–111)
Creatinine, Ser: 1.78 mg/dL — ABNORMAL HIGH (ref 0.61–1.24)
GFR, Estimated: 37 mL/min — ABNORMAL LOW (ref >60)
Glucose, Bld: 156 mg/dL — ABNORMAL HIGH (ref 70–99)
Potassium: 3.5 mmol/L (ref 3.5–5.1)
Sodium: 134 mmol/L — ABNORMAL LOW (ref 135–145)

## 2020-12-03 LAB — CBC
HCT: 26.8 % — ABNORMAL LOW (ref 39.0–52.0)
Hemoglobin: 8.6 g/dL — ABNORMAL LOW (ref 13.0–17.0)
MCH: 25.5 pg — ABNORMAL LOW (ref 26.0–34.0)
MCHC: 32.1 g/dL (ref 30.0–36.0)
MCV: 79.5 fL — ABNORMAL LOW (ref 80.0–100.0)
Platelets: 306 10*3/uL (ref 150–400)
RBC: 3.37 MIL/uL — ABNORMAL LOW (ref 4.22–5.81)
RDW: 15.7 % — ABNORMAL HIGH (ref 11.5–15.5)
WBC: 8 10*3/uL (ref 4.0–10.5)
nRBC: 0 % (ref 0.0–0.2)

## 2020-12-03 LAB — RENAL FUNCTION PANEL
Albumin: 2.9 g/dL — ABNORMAL LOW (ref 3.5–5.0)
Anion gap: 8 (ref 5–15)
BUN: 13 mg/dL (ref 8–23)
CO2: 29 mmol/L (ref 22–32)
Calcium: 7.2 mg/dL — ABNORMAL LOW (ref 8.9–10.3)
Chloride: 98 mmol/L (ref 98–111)
Creatinine, Ser: 1.84 mg/dL — ABNORMAL HIGH (ref 0.61–1.24)
GFR, Estimated: 36 mL/min — ABNORMAL LOW (ref >60)
Glucose, Bld: 121 mg/dL — ABNORMAL HIGH (ref 70–99)
Phosphorus: 2.9 mg/dL (ref 2.5–4.6)
Potassium: 3.4 mmol/L — ABNORMAL LOW (ref 3.5–5.1)
Sodium: 135 mmol/L (ref 135–145)

## 2020-12-03 LAB — AMMONIA: Ammonia: 10 umol/L (ref 9–35)

## 2020-12-03 NOTE — Progress Notes (Signed)
PROGRESS NOTE    Victor Hunter  QIH:[id number] DOB: 04-26-1934 DOA: 11/17/2020 PCP: Rosita Fire, MD     Brief Narrative:  Victor Hunter is an 87 y.o. male with medical history significant for hypertension, esophagitis, BPH who presented to the hospital with chief complaint of shortness of breath, constipation, abdominal distention.  On arrival of EMS, patient was noted to be hypoxic with oxygen saturation of 78% on room air, required up to 15 L high flow nasal cannula O2 on arrival to the emergency department.  Work-up revealed small bowel obstruction with transition in the distal jejunum, bibasilar airspace disease greatest in the left lower lobe.  NG tube was placed, general surgery consulted for SBO.  Patient was also started on IV antibiotic for possible aspiration pneumonia.  Hospitalization further complicated by fluid overload and AKI.  New events last 24 hours / Subjective: Daughter reports that he had an episode of coughing after eating, after which he had been sleeping. He wakes up to voice, but only briefly and then falls back asleep  Assessment & Plan:   Principal Problem:   Small bowel obstruction (HCC) Active Problems:   Aspiration pneumonia (HCC)   Hypokalemia   AKI (acute kidney injury) ([place])   Leukocytosis   Elevated troponin   Hypothyroidism   Sepsis (HCC)   Gastroesophageal reflux disease with hiatal hernia   BPH (benign prostatic hyperplasia)   Essential hypertension   Flash pulmonary edema (HCC)   Elevated brain natriuretic peptide (BNP) level   Elevated d-dimer   SBO -Resolved and tolerating diet now.  General surgery has signed off  AKI on CKD stage IIIb -Baseline creatinine 1.5 -Hold lisinopril thiazide -Renal ultrasound showed small left renal cyst without focal abnormality noted -Slowly improving.  Continue to monitor while getting IV diuretic  Acute on chronic diastolic heart failure/Anasarca -Has been receiving IV fluid during  hospitalization with SBO, AKI.  Now with some shortness of breath, peripheral edema, chest x-ray reviewed independently with perihilar airspace disease -Echocardiogram 11/17/2020 with EF 55 to 60% -Continue IV Lasix, continue 2 twice daily, overall volume overload appears to be improving -he received albumin to help with diuresis  Aspiration pneumonia -Zosyn --> Augmentin, last dose 3/29  -seen by speech therapy with recs for soft diet and thin liquids -chest xray from 3/30 shows improving infiltrates  Hypertension -Lisinopril, thiazide on hold due to AKI, amlodipine on hold due to edema -Continue Cardizem -Blood pressure stable  Severe hiccups -Continue Thorazine as needed  Demand ischemia -Cardiology signed off 3/19 -Echocardiogram without wall motion abnormality, no ischemic work-up necessary.  IV heparin was discontinued  History of RUE DVT in 2013 -No longer on anticoagulation  Hypothyroidism -Continue Synthroid  Elevated LFT -RUQ [place]: no biliary dilatation, status post cholecystectomy -Improving  Left forearm swelling -At site of previous IV infiltration -no DVT on venous dopplers, there was evidence of superficial thrombosis  BPH -continue on finasteride and tamsulosin  Lethargy -patient very lethargic today and was repeatedly falling asleep -check UA, ABG, ammonia, repeat chemistry   DVT prophylaxis:  enoxaparin (LOVENOX) injection 30 mg Start: 11/26/20 1000 SCDs Start: 11/17/20 0538  Code Status: Full code Family Communication: daughter at bedside Disposition Plan:  Status is: Inpatient  Remains inpatient appropriate because:IV treatments appropriate due to intensity of illness or inability to take PO   Dispo: The patient is from: Home              Anticipated d/c is to: Home  Patient currently is not medically stable to d/c.  Continue IV Lasix, remains fluid overloaded.  Home health on discharge, likely 2 to 3 days.   Difficult to place  patient No  Consultants:   General surgery  Cardiology  Antimicrobials:  Anti-infectives (From admission, onward)   Start     Dose/Rate Route Frequency Ordered Stop   11/25/20 2200  piperacillin-tazobactam (ZOSYN) IVPB 3.375 g  Status:  Discontinued        3.375 g 12.5 mL/hr over 240 Minutes Intravenous Every 12 hours 11/25/20 1039 11/25/20 1055   11/25/20 2000  amoxicillin-clavulanate (AUGMENTIN) 500-125 MG per tablet 500 mg        1 tablet Oral 2 times daily 11/25/20 1109 11/30/20 2024   11/25/20 1145  amoxicillin-clavulanate (AUGMENTIN) 875-125 MG per tablet 1 tablet  Status:  Discontinued        1 tablet Oral Every 12 hours 11/25/20 1055 11/25/20 1108   11/22/20 1130  piperacillin-tazobactam (ZOSYN) IVPB 3.375 g  Status:  Discontinued        3.375 g 12.5 mL/hr over 240 Minutes Intravenous Every 8 hours 11/22/20 1022 11/25/20 1039   11/20/20 1200  piperacillin-tazobactam (ZOSYN) IVPB 3.375 g  Status:  Discontinued        3.375 g 12.5 mL/hr over 240 Minutes Intravenous Every 8 hours 11/20/20 1034 11/21/20 0832   11/19/20 1100  vancomycin (VANCOREADY) IVPB 500 mg/100 mL  Status:  Discontinued        500 mg 100 mL/hr over 60 Minutes Intravenous Every 24 hours 11/18/20 1120 11/19/20 1755   11/18/20 1000  vancomycin (VANCOREADY) IVPB 1500 mg/300 mL        1,500 mg 150 mL/hr over 120 Minutes Intravenous Every 48 hours 11/17/20 0845 11/18/20 1145   11/17/20 1200  piperacillin-tazobactam (ZOSYN) IVPB 3.375 g  Status:  Discontinued        3.375 g 12.5 mL/hr over 240 Minutes Intravenous Every 8 hours 11/17/20 0651 11/20/20 1034   11/17/20 0445  vancomycin (VANCOCIN) IVPB 1000 mg/200 mL premix        1,000 mg 200 mL/hr over 60 Minutes Intravenous  Once 11/17/20 0439 11/17/20 0727   11/17/20 0445  piperacillin-tazobactam (ZOSYN) IVPB 3.375 g        3.375 g 100 mL/hr over 30 Minutes Intravenous  Once 11/17/20 0439 11/17/20 0555       Objective: Vitals:   12/02/20 1451 12/02/20 2043  12/03/20 0510 12/03/20 1617  BP: (!) 143/56 (!) 122/51 (!) 120/56 137/60  Pulse: 79 88 70 78  Resp: 17 17 20 18   Temp: 99 F (37.2 C) 99.9 F (37.7 C) 98.9 F (37.2 C) 99.2 F (37.3 C)  TempSrc: Oral Oral Oral Oral  SpO2: 99% 97% 100% 100%  Weight:      Height:        Intake/Output Summary (Last 24 hours) at 12/03/2020 1653 Last data filed at 12/03/2020 0755 Gross per 24 hour  Intake 385 ml  Output 600 ml  Net -215 ml   Filed Weights   11/22/20 0349 11/25/20 0500 11/28/20 0438  Weight: 78.2 kg 79 kg 78.3 kg    Examination: General exam: somnolent Respiratory system: Clear to auscultation. Respiratory effort normal. Cardiovascular system:RRR. No murmurs, rubs, gallops. Gastrointestinal system: Abdomen is nondistended, soft and nontender. No organomegaly or masses felt. Normal bowel sounds heard. Central nervous system: No focal neurological deficits. Extremities: 1+ edema bilaterally Skin: No rashes, lesions or ulcers Psychiatry: somnolent   Data Reviewed: I have  personally reviewed following labs and imaging studies  CBC: Recent Labs  Lab 11/28/20 0108 11/29/20 1048 12/03/20 0144  WBC 11.8* 10.3 8.0  HGB 9.9* 9.6* 8.6*  HCT 31.1* 30.4* 26.8*  MCV 81.2 80.9 79.5*  PLT 238 267 629   Basic Metabolic Panel: Recent Labs  Lab 11/27/20 0328 11/28/20 0108 11/29/20 1048 11/30/20 0133 12/01/20 0102 12/02/20 0158 12/03/20 0144  NA 135   < > 137 136 135 136 135  K 4.1   < > 3.8 3.5 3.3* 3.5 3.4*  CL 104   < > 104 101 99 99 98  CO2 23   < > 26 26 28 29 29   GLUCOSE 135*   < > 144* 119* 137* 110* 121*  BUN 18   < > 13 11 13 11 13   CREATININE 2.07*   < > 2.04* 1.83* 1.98* 1.89* 1.84*  CALCIUM 7.6*   < > 7.5* 7.4* 7.4* 7.6* 7.2*  MG 1.7  --   --   --   --   --   --   PHOS  --   --   --   --   --  3.3 2.9   < > = values in this interval not displayed.   GFR: Estimated Creatinine Clearance: 27.7 mL/min (A) (by C-G formula based on SCr of 1.84 mg/dL (H)). Liver  Function Tests: Recent Labs  Lab 11/27/20 0328 11/28/20 0108 11/29/20 1048 11/30/20 0133 12/01/20 0102 12/02/20 0158 12/03/20 0144  AST 65* 51* 26 19 19   --   --   ALT 113* 108* 80* 64* 54*  --   --   ALKPHOS 75 80 80 74 84  --   --   BILITOT 0.3 0.3 0.5 0.5 0.6  --   --   PROT 5.6* 5.5* 5.7* 5.6* 6.1*  --   --   ALBUMIN 2.3* 2.3* 2.3* 2.1* 2.2* 3.2* 2.9*   No results for input(s): LIPASE, AMYLASE in the last 168 hours. No results for input(s): AMMONIA in the last 168 hours. Coagulation Profile: No results for input(s): INR, PROTIME in the last 168 hours. Cardiac Enzymes: No results for input(s): CKTOTAL, CKMB, CKMBINDEX, TROPONINI in the last 168 hours. BNP (last 3 results) No results for input(s): PROBNP in the last 8760 hours. HbA1C: No results for input(s): HGBA1C in the last 72 hours. CBG: No results for input(s): GLUCAP in the last 168 hours. Lipid Profile: No results for input(s): CHOL, HDL, LDLCALC, TRIG, CHOLHDL, LDLDIRECT in the last 72 hours. Thyroid Function Tests: No results for input(s): TSH, T4TOTAL, FREET4, T3FREE, THYROIDAB in the last 72 hours. Anemia Panel: No results for input(s): VITAMINB12, FOLATE, FERRITIN, TIBC, IRON, RETICCTPCT in the last 72 hours. Sepsis Labs: No results for input(s): PROCALCITON, LATICACIDVEN in the last 168 hours.  No results found for this or any previous visit (from the past 240 hour(s)).    Radiology Studies: DG CHEST PORT 1 VIEW  Result Date: 12/01/2020 CLINICAL DATA:  Cough EXAM: PORTABLE CHEST 1 VIEW COMPARISON:  11/25/2020, CT 09/09/2008 FINDINGS: Postsurgical changes at the thoracic inlet. Cardiomegaly with decreased bilateral ground-glass opacities compared to prior. No consolidation or pleural effusion. No pneumothorax. IMPRESSION: Cardiomegaly with decreased bilateral ground-glass opacities/probable edema compared to prior. Electronically Signed   By: Donavan Foil M.D.   On: 12/01/2020 18:09      Scheduled  Meds: . Chlorhexidine Gluconate Cloth  6 each Topical Daily  . diltiazem  180 mg Oral Daily  . enoxaparin (LOVENOX)  injection  30 mg Subcutaneous Daily  . feeding supplement  237 mL Oral BID BM  . finasteride  5 mg Oral Daily  . furosemide  60 mg Intravenous BID  . guaiFENesin-dextromethorphan  10 mL Oral Q6H  . levothyroxine  112 mcg Oral Q0600  . pantoprazole  40 mg Oral Q12H  . tamsulosin  0.4 mg Oral Daily   Continuous Infusions: . chlorproMAZINE (THORAZINE) IV 25 mg (12/03/20 1258)     LOS: 16 days      Time spent: 30 minutes   Kathie Dike, MD [facility] 12/03/2020, 4:53 PM   Available via Epic secure chat 7am-7pm After these hours, please refer to coverage provider listed on [email or web address]

## 2020-12-04 DIAGNOSIS — J690 Pneumonitis due to inhalation of food and vomit: Secondary | ICD-10-CM | POA: Diagnosis not present

## 2020-12-04 DIAGNOSIS — K56609 Unspecified intestinal obstruction, unspecified as to partial versus complete obstruction: Secondary | ICD-10-CM | POA: Diagnosis not present

## 2020-12-04 DIAGNOSIS — N179 Acute kidney failure, unspecified: Secondary | ICD-10-CM | POA: Diagnosis not present

## 2020-12-04 DIAGNOSIS — J9601 Acute respiratory failure with hypoxia: Secondary | ICD-10-CM | POA: Diagnosis not present

## 2020-12-04 LAB — BASIC METABOLIC PANEL
Anion gap: 9 (ref 5–15)
BUN: 13 mg/dL (ref 8–23)
CO2: 29 mmol/L (ref 22–32)
Calcium: 7.2 mg/dL — ABNORMAL LOW (ref 8.9–10.3)
Chloride: 98 mmol/L (ref 98–111)
Creatinine, Ser: 1.81 mg/dL — ABNORMAL HIGH (ref 0.61–1.24)
GFR, Estimated: 36 mL/min — ABNORMAL LOW (ref >60)
Glucose, Bld: 161 mg/dL — ABNORMAL HIGH (ref 70–99)
Potassium: 3.3 mmol/L — ABNORMAL LOW (ref 3.5–5.1)
Sodium: 136 mmol/L (ref 135–145)

## 2020-12-04 LAB — CBC
HCT: 27.8 % — ABNORMAL LOW (ref 39.0–52.0)
Hemoglobin: 8.8 g/dL — ABNORMAL LOW (ref 13.0–17.0)
MCH: 25.2 pg — ABNORMAL LOW (ref 26.0–34.0)
MCHC: 31.7 g/dL (ref 30.0–36.0)
MCV: 79.7 fL — ABNORMAL LOW (ref 80.0–100.0)
Platelets: 346 10*3/uL (ref 150–400)
RBC: 3.49 MIL/uL — ABNORMAL LOW (ref 4.22–5.81)
RDW: 15.7 % — ABNORMAL HIGH (ref 11.5–15.5)
WBC: 8.2 10*3/uL (ref 4.0–10.5)
nRBC: 0 % (ref 0.0–0.2)

## 2020-12-04 MED ORDER — BENZONATATE 100 MG PO CAPS
200.0000 mg | ORAL_CAPSULE | Freq: Three times a day (TID) | ORAL | Status: DC | PRN
  Administered 2020-12-04 – 2020-12-05 (×3): 200 mg via ORAL
  Filled 2020-12-04 (×3): qty 2

## 2020-12-04 MED ORDER — TORSEMIDE 20 MG PO TABS
40.0000 mg | ORAL_TABLET | Freq: Every day | ORAL | Status: DC
  Administered 2020-12-04 – 2020-12-05 (×2): 40 mg via ORAL
  Filled 2020-12-04 (×2): qty 2

## 2020-12-04 MED ORDER — POTASSIUM CHLORIDE CRYS ER 20 MEQ PO TBCR
40.0000 meq | EXTENDED_RELEASE_TABLET | ORAL | Status: AC
  Administered 2020-12-04 (×2): 40 meq via ORAL
  Filled 2020-12-04 (×2): qty 2

## 2020-12-04 NOTE — Progress Notes (Signed)
PROGRESS NOTE    Victor Hunter  VQM:[id number] DOB: September 23, 1933 DOA: 11/17/2020 PCP: Rosita Fire, MD     Brief Narrative:  Victor Hunter is an 87 y.o. male with medical history significant for hypertension, esophagitis, BPH who presented to the hospital with chief complaint of shortness of breath, constipation, abdominal distention.  On arrival of EMS, patient was noted to be hypoxic with oxygen saturation of 78% on room air, required up to 15 L high flow nasal cannula O2 on arrival to the emergency department.  Work-up revealed small bowel obstruction with transition in the distal jejunum, bibasilar airspace disease greatest in the left lower lobe.  NG tube was placed, general surgery consulted for SBO.  Patient was also started on IV antibiotic for possible aspiration pneumonia.  Hospitalization further complicated by fluid overload and AKI.  New events last 24 hours / Subjective: Patient sitting in up chair. Mental status back to baseline. Denies any shortness of breath  Assessment & Plan:   Principal Problem:   Small bowel obstruction (HCC) Active Problems:   Aspiration pneumonia (HCC)   Hypokalemia   AKI (acute kidney injury) ([place])   Leukocytosis   Elevated troponin   Hypothyroidism   Sepsis (HCC)   Gastroesophageal reflux disease with hiatal hernia   BPH (benign prostatic hyperplasia)   Essential hypertension   Flash pulmonary edema (HCC)   Elevated brain natriuretic peptide (BNP) level   Elevated d-dimer   SBO -Resolved and tolerating diet now.  General surgery has signed off  AKI on CKD stage IIIb -Baseline creatinine 1.5 -Hold lisinopril thiazide -Renal ultrasound showed small left renal cyst without focal abnormality noted -Slowly improving.  Continue to monitor while getting IV diuretic  Acute on chronic diastolic heart failure/Anasarca -Has been receiving IV fluid during hospitalization with SBO, AKI.  Now with some shortness of breath, peripheral edema,  chest x-ray reviewed independently with perihilar airspace disease -Echocardiogram 11/17/2020 with EF 55 to 60% -Treated with IV Lasix 60mg  twice daily, overall volume overload appears to be improving -he received albumin to help with diuresis -will transition to oral torsemide and monitor urine output  Aspiration pneumonia -Zosyn --> Augmentin, last dose 3/29  -seen by speech therapy with recs for soft diet and thin liquids -chest xray from 3/30 shows improving infiltrates  Hypertension -Lisinopril, thiazide on hold due to AKI, amlodipine on hold due to edema -Continue Cardizem -Blood pressure stable  Severe hiccups -Continue Thorazine as needed  Demand ischemia -Cardiology signed off 3/19 -Echocardiogram without wall motion abnormality, no ischemic work-up necessary.  IV heparin was discontinued  History of RUE DVT in 2013 -No longer on anticoagulation  Hypothyroidism -Continue Synthroid  Elevated LFT -RUQ [place]: no biliary dilatation, status post cholecystectomy -Improving  Left forearm swelling -At site of previous IV infiltration -no DVT on venous dopplers, there was evidence of superficial thrombosis  BPH -continue on finasteride and tamsulosin   DVT prophylaxis:  enoxaparin (LOVENOX) injection 30 mg Start: 11/26/20 1000 SCDs Start: 11/17/20 0538  Code Status: Full code Family Communication: daughter at bedside Disposition Plan:  Status is: Inpatient  Remains inpatient appropriate because:IV treatments appropriate due to intensity of illness or inability to take PO   Dispo: The patient is from: Home              Anticipated d/c is to: Home              Patient currently is not medically stable to d/c.  Continue IV Lasix, remains  fluid overloaded.  Home health on discharge, likely 2 to 3 days.   Difficult to place patient No  Consultants:   General surgery  Cardiology  Antimicrobials:  Anti-infectives (From admission, onward)   Start     Dose/Rate  Route Frequency Ordered Stop   11/25/20 2200  piperacillin-tazobactam (ZOSYN) IVPB 3.375 g  Status:  Discontinued        3.375 g 12.5 mL/hr over 240 Minutes Intravenous Every 12 hours 11/25/20 1039 11/25/20 1055   11/25/20 2000  amoxicillin-clavulanate (AUGMENTIN) 500-125 MG per tablet 500 mg        1 tablet Oral 2 times daily 11/25/20 1109 11/30/20 2024   11/25/20 1145  amoxicillin-clavulanate (AUGMENTIN) 875-125 MG per tablet 1 tablet  Status:  Discontinued        1 tablet Oral Every 12 hours 11/25/20 1055 11/25/20 1108   11/22/20 1130  piperacillin-tazobactam (ZOSYN) IVPB 3.375 g  Status:  Discontinued        3.375 g 12.5 mL/hr over 240 Minutes Intravenous Every 8 hours 11/22/20 1022 11/25/20 1039   11/20/20 1200  piperacillin-tazobactam (ZOSYN) IVPB 3.375 g  Status:  Discontinued        3.375 g 12.5 mL/hr over 240 Minutes Intravenous Every 8 hours 11/20/20 1034 11/21/20 0832   11/19/20 1100  vancomycin (VANCOREADY) IVPB 500 mg/100 mL  Status:  Discontinued        500 mg 100 mL/hr over 60 Minutes Intravenous Every 24 hours 11/18/20 1120 11/19/20 1755   11/18/20 1000  vancomycin (VANCOREADY) IVPB 1500 mg/300 mL        1,500 mg 150 mL/hr over 120 Minutes Intravenous Every 48 hours 11/17/20 0845 11/18/20 1145   11/17/20 1200  piperacillin-tazobactam (ZOSYN) IVPB 3.375 g  Status:  Discontinued        3.375 g 12.5 mL/hr over 240 Minutes Intravenous Every 8 hours 11/17/20 0651 11/20/20 1034   11/17/20 0445  vancomycin (VANCOCIN) IVPB 1000 mg/200 mL premix        1,000 mg 200 mL/hr over 60 Minutes Intravenous  Once 11/17/20 0439 11/17/20 0727   11/17/20 0445  piperacillin-tazobactam (ZOSYN) IVPB 3.375 g        3.375 g 100 mL/hr over 30 Minutes Intravenous  Once 11/17/20 0439 11/17/20 0555       Objective: Vitals:   12/03/20 0510 12/03/20 1617 12/03/20 2037 12/04/20 0404  BP: (!) 120/56 137/60 124/62 (!) 107/56  Pulse: 70 78 85 86  Resp: 20 18 17 18   Temp: 98.9 F (37.2 C) 99.2 F  (37.3 C) 99 F (37.2 C) 99.5 F (37.5 C)  TempSrc: Oral Oral Oral Oral  SpO2: 100% 100% 97% 96%  Weight:      Height:        Intake/Output Summary (Last 24 hours) at 12/04/2020 1559 Last data filed at 12/04/2020 0616 Gross per 24 hour  Intake 445 ml  Output 1175 ml  Net -730 ml   Filed Weights   11/22/20 0349 11/25/20 0500 11/28/20 0438  Weight: 78.2 kg 79 kg 78.3 kg    Examination: General exam: Alert, awake, oriented x 3 Respiratory system: Clear to auscultation. Respiratory effort normal. Cardiovascular system:RRR. No murmurs, rubs, gallops. Gastrointestinal system: Abdomen is nondistended, soft and nontender. No organomegaly or masses felt. Normal bowel sounds heard. Central nervous system: Alert and oriented. No focal neurological deficits. Extremities: 1+ edema bilaterally Skin: No rashes, lesions or ulcers Psychiatry: Judgement and insight appear normal. Mood & affect appropriate.     Data  Reviewed: I have personally reviewed following labs and imaging studies  CBC: Recent Labs  Lab 11/28/20 0108 11/29/20 1048 12/03/20 0144 12/04/20 0016  WBC 11.8* 10.3 8.0 8.2  HGB 9.9* 9.6* 8.6* 8.8*  HCT 31.1* 30.4* 26.8* 27.8*  MCV 81.2 80.9 79.5* 79.7*  PLT 238 267 306 867   Basic Metabolic Panel: Recent Labs  Lab 12/01/20 0102 12/02/20 0158 12/03/20 0144 12/03/20 1704 12/04/20 0016  NA 135 136 135 134* 136  K 3.3* 3.5 3.4* 3.5 3.3*  CL 99 99 98 96* 98  CO2 28 29 29 30 29   GLUCOSE 137* 110* 121* 156* 161*  BUN 13 11 13 13 13   CREATININE 1.98* 1.89* 1.84* 1.78* 1.81*  CALCIUM 7.4* 7.6* 7.2* 7.1* 7.2*  PHOS  --  3.3 2.9  --   --    GFR: Estimated Creatinine Clearance: 28.1 mL/min (A) (by C-G formula based on SCr of 1.81 mg/dL (H)). Liver Function Tests: Recent Labs  Lab 11/28/20 0108 11/29/20 1048 11/30/20 0133 12/01/20 0102 12/02/20 0158 12/03/20 0144  AST 51* 26 19 19   --   --   ALT 108* 80* 64* 54*  --   --   ALKPHOS 80 80 74 84  --   --    BILITOT 0.3 0.5 0.5 0.6  --   --   PROT 5.5* 5.7* 5.6* 6.1*  --   --   ALBUMIN 2.3* 2.3* 2.1* 2.2* 3.2* 2.9*   No results for input(s): LIPASE, AMYLASE in the last 168 hours. Recent Labs  Lab 12/03/20 1704  AMMONIA 10   Coagulation Profile: No results for input(s): INR, PROTIME in the last 168 hours. Cardiac Enzymes: No results for input(s): CKTOTAL, CKMB, CKMBINDEX, TROPONINI in the last 168 hours. BNP (last 3 results) No results for input(s): PROBNP in the last 8760 hours. HbA1C: No results for input(s): HGBA1C in the last 72 hours. CBG: No results for input(s): GLUCAP in the last 168 hours. Lipid Profile: No results for input(s): CHOL, HDL, LDLCALC, TRIG, CHOLHDL, LDLDIRECT in the last 72 hours. Thyroid Function Tests: No results for input(s): TSH, T4TOTAL, FREET4, T3FREE, THYROIDAB in the last 72 hours. Anemia Panel: No results for input(s): VITAMINB12, FOLATE, FERRITIN, TIBC, IRON, RETICCTPCT in the last 72 hours. Sepsis Labs: No results for input(s): PROCALCITON, LATICACIDVEN in the last 168 hours.  No results found for this or any previous visit (from the past 240 hour(s)).    Radiology Studies: No results found.    Scheduled Meds: . Chlorhexidine Gluconate Cloth  6 each Topical Daily  . diltiazem  180 mg Oral Daily  . enoxaparin (LOVENOX) injection  30 mg Subcutaneous Daily  . feeding supplement  237 mL Oral BID BM  . finasteride  5 mg Oral Daily  . guaiFENesin-dextromethorphan  10 mL Oral Q6H  . levothyroxine  112 mcg Oral Q0600  . pantoprazole  40 mg Oral Q12H  . potassium chloride  40 mEq Oral Q4H  . tamsulosin  0.4 mg Oral Daily  . torsemide  40 mg Oral Daily   Continuous Infusions: . chlorproMAZINE (THORAZINE) IV 25 mg (12/03/20 2246)     LOS: 17 days      Time spent: 30 minutes   Kathie Dike, MD [facility] 12/04/2020, 3:59 PM   Available via Epic secure chat 7am-7pm After these hours, please refer to coverage provider listed  on [email or web address]

## 2020-12-05 DIAGNOSIS — K56609 Unspecified intestinal obstruction, unspecified as to partial versus complete obstruction: Secondary | ICD-10-CM | POA: Diagnosis not present

## 2020-12-05 DIAGNOSIS — J9601 Acute respiratory failure with hypoxia: Secondary | ICD-10-CM | POA: Diagnosis not present

## 2020-12-05 DIAGNOSIS — J690 Pneumonitis due to inhalation of food and vomit: Secondary | ICD-10-CM | POA: Diagnosis not present

## 2020-12-05 DIAGNOSIS — N179 Acute kidney failure, unspecified: Secondary | ICD-10-CM | POA: Diagnosis not present

## 2020-12-05 LAB — BASIC METABOLIC PANEL
Anion gap: 9 (ref 5–15)
BUN: 14 mg/dL (ref 8–23)
CO2: 30 mmol/L (ref 22–32)
Calcium: 7.3 mg/dL — ABNORMAL LOW (ref 8.9–10.3)
Chloride: 97 mmol/L — ABNORMAL LOW (ref 98–111)
Creatinine, Ser: 1.77 mg/dL — ABNORMAL HIGH (ref 0.61–1.24)
GFR, Estimated: 37 mL/min — ABNORMAL LOW (ref >60)
Glucose, Bld: 135 mg/dL — ABNORMAL HIGH (ref 70–99)
Potassium: 3.6 mmol/L (ref 3.5–5.1)
Sodium: 136 mmol/L (ref 135–145)

## 2020-12-05 MED ORDER — TORSEMIDE 40 MG PO TABS: 40.0000 mg | ORAL_TABLET | Freq: Every day | ORAL | 0 refills | Status: AC

## 2020-12-05 MED ORDER — PANTOPRAZOLE SODIUM 40 MG PO TBEC: 40.0000 mg | DELAYED_RELEASE_TABLET | Freq: Every day | ORAL | 1 refills | Status: DC

## 2020-12-05 MED ORDER — PANTOPRAZOLE SODIUM 40 MG PO TBEC
40.0000 mg | DELAYED_RELEASE_TABLET | Freq: Every day | ORAL | 1 refills | Status: AC
Start: 2020-12-05 — End: ?

## 2020-12-05 MED ORDER — GUAIFENESIN-DM 100-10 MG/5ML PO SYRP: 10.0000 mL | ORAL_SOLUTION | Freq: Four times a day (QID) | ORAL | 0 refills | Status: DC

## 2020-12-05 MED ORDER — GUAIFENESIN-DM 100-10 MG/5ML PO SYRP: 10.0000 mL | ORAL_SOLUTION | Freq: Four times a day (QID) | ORAL | 0 refills | Status: AC

## 2020-12-05 MED ORDER — TORSEMIDE 40 MG PO TABS: 40.0000 mg | ORAL_TABLET | Freq: Every day | ORAL | 0 refills | Status: DC

## 2020-12-05 NOTE — Progress Notes (Signed)
Discharge instructions reviewed with pt and 2 of pt's daughters and instructed on where to pick up prescriptions. Pt and daughters verbalized understanding and had no questions.  Pt discharged in stable condition via wheelchair with daughters.  Eliezer [place]

## 2020-12-05 NOTE — Discharge Summary (Signed)
Physician Discharge Summary  Victor Hunter ZMO:[id number] DOB: 16-Mar-1934 DOA: 11/17/2020  PCP: Rosita Fire, MD  Admit date: 11/17/2020 Discharge date: 12/05/2020  Admitted From: Home Disposition: Home  Recommendations for Outpatient Follow-up:  1. Follow up with PCP in 1-2 weeks 2. Please obtain BMP/CBC in one week 3. Follow-up with [place] surgery on elective basis to discuss repair of ventral hernia  Home Health: [facility] PT/OT Equipment/Devices:  Discharge Condition:stable CODE STATUS:full code Diet recommendation: heart healthy  Brief/Interim Summary: Victor Hunter is an 87 y.o.malewith medical history significant forhypertension, esophagitis, BPH who presented to the hospital with chief complaint of shortness of breath, constipation, abdominal distention.  On arrival of EMS, patient was noted to be hypoxic with oxygen saturation of 78% on room air, required up to 15 L high flow nasal cannula O2 on arrival to the emergency department.  Work-up revealed small bowel obstruction with transition in the distal jejunum, bibasilar airspace disease greatest in the left lower lobe.  NG tube was placed, general surgery consulted for SBO.  Patient was also started on IV antibiotic for possible aspiration pneumonia.  Hospitalization further complicated by fluid overload and AKI.  Discharge Diagnoses:  Principal Problem:   Small bowel obstruction ([place]) Active Problems:   Aspiration pneumonia (HCC)   Hypokalemia   AKI (acute kidney injury) ([place])   Leukocytosis   Elevated troponin   Hypothyroidism   Sepsis (HCC)   Gastroesophageal reflux disease with hiatal hernia   BPH (benign prostatic hyperplasia)   Essential hypertension   Flash pulmonary edema (HCC)   Elevated brain natriuretic peptide (BNP) level   Elevated d-dimer  SBO -Patient was treated with conservative management, NG tube decompression and bowel rest -Overall symptoms have improved and he is now tolerating a  solid diet -Bowel obstruction has not resolved -Regarding his ventral hernia, this did not appear to be incarcerated and was reducible -He has been offered follow-up with [place] surgery to discuss elective repair  AKI on CKD stage IIIb -Baseline creatinine 1.5 -Hold lisinopril thiazide -Renal ultrasound showed small left renal cyst without focal abnormality noted -Patient was monitored in the hospital since he was receiving IV diuretics -Current creatinine is 1.7 -Since he is being sent home on torsemide, he will need repeat basic metabolic panel in 1 week to follow renal function  Acute on chronic diastolic heart failure/Anasarca -He had been receiving IV fluid during hospitalization with SBO, AKI.    He subsequently developed shortness of breath, peripheral edema, chest x-ray reviewed independently with perihilar airspace disease -Echocardiogram 11/17/2020 with EF 55 to 60% -Treated with IV Lasix 60mg  twice daily, overall volume overload appears to be improving -he received albumin to help with diuresis -He was transitioned to oral torsemide and renal function remained stable  Aspiration pneumonia -Zosyn --> Augmentin, last dose 3/29  -seen by speech therapy with recs for soft diet and thin liquids -Reflux precautions advised -chest xray from 3/30 shows improving infiltrates  Hypertension -Lisinopril, thiazide on hold due to AKI, amlodipine on hold due to edema -Continue Cardizem -Blood pressure stable  Severe hiccups -Continue Thorazine as needed  Demand ischemia -Cardiology signed off 3/19 -Echocardiogram without wall motion abnormality, no ischemic work-up necessary.  IV heparin was discontinued  History of RUE DVT in 2013 -No longer on anticoagulation  Hypothyroidism -Continue Synthroid  Elevated LFT -RUQ [place]: no biliary dilatation, status post cholecystectomy -Improving  Left forearm swelling -At site of previous IV infiltration -no DVT on  venous dopplers, although there  was evidence of superficial thrombosis -Continue supportive management  BPH -continue on finasteride and tamsulosin  Discharge Instructions  Discharge Instructions    Diet - low sodium heart healthy   Complete by: As directed    Increase activity slowly   Complete by: As directed      Allergies as of 12/05/2020   No Known Allergies     Medication List    STOP taking these medications   Colchicine 0.6 MG Caps   hydrochlorothiazide 25 MG tablet Commonly known as: HYDRODIURIL   lisinopril 20 MG tablet Commonly known as: ZESTRIL     TAKE these medications   chlorproMAZINE 10 MG tablet Commonly known as: THORAZINE Take 10 mg by mouth every 6 (six) hours as needed for hiccoughs.   diltiazem 180 MG 24 hr capsule Commonly known as: CARDIZEM CD Take 180 mg by mouth daily.   finasteride 5 MG tablet Commonly known as: PROSCAR Take 5 mg by mouth daily.   guaiFENesin-dextromethorphan 100-10 MG/5ML syrup Commonly known as: ROBITUSSIN DM Take 10 mLs by mouth every 6 (six) hours.   HYDROcodone-acetaminophen 5-325 MG tablet Commonly known as: NORCO/VICODIN Take 1 tablet by mouth every 6 (six) hours as needed for severe pain.   levothyroxine 112 MCG tablet Commonly known as: SYNTHROID Take 112 mcg by mouth daily before breakfast.   pantoprazole 40 MG tablet Commonly known as: PROTONIX Take 1 tablet (40 mg total) by mouth daily.   potassium chloride SA 20 MEQ tablet Commonly known as: KLOR-CON Take 20 mEq by mouth daily.   tamsulosin 0.4 MG Caps capsule Commonly known as: FLOMAX Take 0.4 mg by mouth daily.   Torsemide 40 MG Tabs Take 40 mg by mouth daily. Start taking on: December 06, 2020       Follow-up Information    [facility] Follow up.   Specialty: Home Health Services Contact information: [address] [place] [address] [phone]        [facility] [place] Follow up.    Specialty: General Surgery Why: If you would like to discuss repair of your ventral hernia on an elective basis  Contact information: [address] [place] [phone]       Rosita Fire, MD. Schedule an appointment as soon as possible for a visit in 1 week(s).   Specialty: Internal Medicine Why: you need to have a blood test in 1 week to recheck kidney function Contact information: [place]  [address] [phone]              No Known Allergies  Consultations:  General surgery  cardiology   Procedures/Studies: CT ABDOMEN PELVIS WO CONTRAST  Result Date: 11/17/2020 CLINICAL DATA:  Abdominal distension, constipation, short of breath EXAM: CT ABDOMEN AND PELVIS WITHOUT CONTRAST TECHNIQUE: Multidetector CT imaging of the abdomen and pelvis was performed following the standard protocol without IV contrast. COMPARISON:  11/17/2020 FINDINGS: Lower chest: There is bibasilar airspace disease greatest within the lower lobes, left greater than right. Findings could reflect edema or infection. No effusion or pneumothorax. Unenhanced CT was performed per clinician order. Lack of IV contrast limits sensitivity and specificity, especially for evaluation of abdominal/pelvic solid viscera. Hepatobiliary: No focal liver abnormality is seen. Status post cholecystectomy. No biliary dilatation. Pancreas: Unremarkable. No pancreatic ductal dilatation or surrounding inflammatory changes. Spleen: Normal in size without focal abnormality. Adrenals/Urinary Tract: No urinary tract calculi or obstructive uropathy. The adrenals and bladder are  unremarkable. Stomach/Bowel: There is distension of the stomach and proximal small bowel, with numerous proximal small bowel gas fluid levels, consistent with obstruction. There is change in caliber within the distal jejunum within the right lower quadrant. A stable Richter type hernia within  the central upper abdomen is identified, with protrusion of the anti mesenteric aspect of the mid transverse colonic wall through the hernia defect. No wall thickening or fat stranding to suggest incarceration. Vascular/Lymphatic: Aortic atherosclerosis. No enlarged abdominal or pelvic lymph nodes. Reproductive: Continued marked enlargement of the prostate, which measures 5.8 x 6.1 x 6.0 cm. Other: No free fluid or free gas. Richter type hernia in the central upper abdomen as described above. Musculoskeletal: No acute or destructive bony lesions. Reconstructed images demonstrate no additional findings. IMPRESSION: 1. Small bowel obstruction, with transition in the distal jejunum right lower quadrant. 2. Bibasilar airspace disease, greatest in the left lower lobe. Favor infection over edema. 3. Richter hernia central upper abdomen, with protrusion of the anti mesenteric wall of the mid transverse colon through the hernia defect. No incarceration. 4. Marked enlargement of the prostate. 5.  Aortic Atherosclerosis (ICD10-I70.0). Electronically Signed   By: Randa Ngo M.D.   On: 11/17/2020 03:39   DG Chest 1 View  Result Date: 11/17/2020 CLINICAL DATA:  Nasogastric tube insertion EXAM: CHEST  1 VIEW COMPARISON:  Chest x-ray earlier today FINDINGS: The enteric tube tip is at the stomach with side port near the GE junction. Opacity in the lower lungs as seen on preceding CT. Normal heart size. Presumed thyroidectomy. No visible effusion or pneumothorax IMPRESSION: 1. Enteric tube with tip at the stomach and side port near the GE junction. 2. Bilateral pulmonary infiltrate, question aspiration given the degree of gastric distension on prior. Electronically Signed   By: Monte Fantasia M.D.   On: 11/17/2020 04:33   DG Chest 2 View  Result Date: 11/19/2020 CLINICAL DATA:  Pneumonia. EXAM: CHEST - 2 VIEW COMPARISON:  November 17, 2020. FINDINGS: Stable cardiomegaly. No pneumothorax is noted. Small bilateral pleural  effusions are noted. Nasogastric tube tip is seen in expected position of gastroesophageal junction. Right lower lobe atelectasis or infiltrate is noted. Bony thorax is unremarkable. IMPRESSION: Small bilateral pleural effusions. Right lower lobe atelectasis or infiltrate is noted. Nasogastric tube tip is seen in expected position of gastroesophageal junction. Electronically Signed   By: Marijo Conception M.D.   On: 11/19/2020 08:37   DG Abd 1 View  Result Date: 11/20/2020 CLINICAL DATA:  Nasogastric tube placement EXAM: ABDOMEN - 1 VIEW COMPARISON:  11/20/2020 at 6:34 a.m. FINDINGS: The nasogastric tube is been advanced and its tip is in the stomach antrum region with side port in the stomach body. Contrast medium noted in the colon. IMPRESSION: 1. Nasogastric tube tip is in the stomach antrum region with side port in the stomach body. Electronically Signed   By: Van Clines M.D.   On: 11/20/2020 16:08   DG Abd 1 View  Result Date: 11/19/2020 CLINICAL DATA:  G-tube placement EXAM: ABDOMEN - 1 VIEW COMPARISON:  11/19/2020 FINDINGS: NG tube has been advanced. The tip is in the stomach with the side port likely near the GE junction. IMPRESSION: NG tube advanced into the stomach with the side port near the GE junction. Electronically Signed   By: Rolm Baptise M.D.   On: 11/19/2020 17:58   DG Abd 1 View  Result Date: 11/19/2020 CLINICAL DATA:  NG placement EXAM: ABDOMEN - 1 VIEW COMPARISON:  Earlier  same day FINDINGS: Enteric tube passes into the stomach with side port at the gastroesophageal junction. Consider advancement. Residual contrast within the bowel. IMPRESSION: Enteric tube within the proximal stomach with the side port at the gastroesophageal junction. Consider advancement. Electronically Signed   By: Macy Mis M.D.   On: 11/19/2020 16:07   NM Pulmonary Perfusion  Result Date: 11/17/2020 CLINICAL DATA:  Shortness of breath. EXAM: NUCLEAR MEDICINE PERFUSION LUNG SCAN TECHNIQUE:  Perfusion images were obtained in multiple projections after intravenous injection of radiopharmaceutical. Ventilation scans intentionally deferred if perfusion scan and chest x-ray adequate for interpretation during COVID 19 epidemic. RADIOPHARMACEUTICALS:  4.0 mCi Tc-74m MAA IV COMPARISON:  CT abdomen/pelvis 11/17/2020 and chest x-ray 11/17/2020. FINDINGS: There is a large area of decreased perfusion involving the superior segment of the left lower lobe. This corresponds to a large infiltrate seen best on the prior CT scan. I do not see any findings suspicious for pulmonary embolism. IMPRESSION: Left-sided perfusion defect matching a large infiltrate on recent chest x-ray and CT scan. No findings suspicious for pulmonary embolism. Electronically Signed   By: Marijo Sanes M.D.   On: 11/17/2020 13:10   US RENAL  Result Date: 11/24/2020 CLINICAL DATA:  Acute renal injury EXAM: RENAL / URINARY TRACT ULTRASOUND COMPLETE COMPARISON:  None. FINDINGS: Right Kidney: Renal measurements: 11.8 x 5.4 x 5.3 cm. = volume: 177 mL. Echogenicity within normal limits. No mass or hydronephrosis visualized. Left Kidney: Renal measurements: 9.6 x 4.7 x 4.6 cm. = volume: 106 mL. 1 cm cyst is noted in the lower pole of the left kidney. Bladder: Decompressed by Foley catheter. Other: None. IMPRESSION: Small left renal cyst. No other focal abnormality is noted. Electronically Signed   By: Inez Catalina M.D.   On: 11/24/2020 12:17   DG CHEST PORT 1 VIEW  Result Date: 12/01/2020 CLINICAL DATA:  Cough EXAM: PORTABLE CHEST 1 VIEW COMPARISON:  11/25/2020, CT 09/09/2008 FINDINGS: Postsurgical changes at the thoracic inlet. Cardiomegaly with decreased bilateral ground-glass opacities compared to prior. No consolidation or pleural effusion. No pneumothorax. IMPRESSION: Cardiomegaly with decreased bilateral ground-glass opacities/probable edema compared to prior. Electronically Signed   By: Donavan Foil M.D.   On: 12/01/2020 18:09   DG  CHEST PORT 1 VIEW  Result Date: 11/25/2020 CLINICAL DATA:  Short of breath EXAM: PORTABLE CHEST 1 VIEW COMPARISON:  11/19/2020 FINDINGS: Single frontal view of the chest demonstrates stable enlargement of the cardiac silhouette. Lung volumes are diminished, with left greater than right perihilar airspace disease. Trace left effusion is suspected. No pneumothorax. Right-sided PICC tip overlies the atriocaval junction. IMPRESSION: 1. Bilateral left greater than right perihilar ground-glass airspace disease, favor asymmetric edema over infection. 2. Trace left pleural effusion. Electronically Signed   By: Randa Ngo M.D.   On: 11/25/2020 20:06   DG Chest Portable 1 View  Result Date: 11/17/2020 CLINICAL DATA:  Short of breath, rales EXAM: PORTABLE CHEST 1 VIEW COMPARISON:  07/20/2015 FINDINGS: Single frontal view of the chest demonstrates a stable cardiac silhouette. There is prominence of the central pulmonary vasculature, without acute airspace disease, effusion, or pneumothorax. Postsurgical changes are seen from thyroidectomy. No acute bony abnormalities. IMPRESSION: 1. Increased central vascular congestion without overt edema. Electronically Signed   By: Randa Ngo M.D.   On: 11/17/2020 02:21   DG ABD ACUTE 2+V W 1V CHEST  Result Date: 11/21/2020 CLINICAL DATA:  Free air, shortness of breath EXAM: DG ABDOMEN ACUTE WITH 1 VIEW CHEST COMPARISON:  08/22/2021 FINDINGS: No visible  free air. No organomegaly or suspicious calcification. Nonobstructive bowel gas pattern. Prior cholecystectomy. Airspace opacity throughout the left mid and lower lung concerning for pneumonia. No confluent opacity on the right. No effusions. Heart is upper limits normal in size. IMPRESSION: No evidence of bowel obstruction or free air. Airspace opacity throughout the left mid and lower lung concerning for pneumonia. Electronically Signed   By: Rolm Baptise M.D.   On: 11/21/2020 21:16   DG Abd Portable 1V-Small Bowel  Obstruction Protocol-24 hr delay  Result Date: 11/20/2020 CLINICAL DATA:  Small-bowel obstruction EXAM: PORTABLE ABDOMEN - 1 VIEW COMPARISON:  11/19/2020 FINDINGS: Contrast medium is present in the colon and rectum, similar to yesterday's exam. Contrast fills the appendix. No dilated bowel identified. Indistinct airspace opacities medially in the lower lobes, as on prior exams. Nasogastric tube is present in the stomach body. Levoconvex lumbar scoliosis. IMPRESSION: 1. Contrast is present in the colon and rectum. No dilated bowel identified. 2. Medial bibasilar lower lobe airspace opacities, as on prior exams. Electronically Signed   By: Van Clines M.D.   On: 11/20/2020 10:42   DG Abd Portable 1V-Small Bowel Obstruction Protocol-initial, 8 hr delay  Result Date: 11/19/2020 CLINICAL DATA:  Small bowel obstruction EXAM: PORTABLE ABDOMEN - 1 VIEW COMPARISON:  11/19/2020 FINDINGS: NG tube is in the stomach. Gas and contrast seen throughout the colon. Improved small bowel dilatation compatible with improving obstruction. IMPRESSION: NG tube in the proximal stomach. Improving small bowel obstruction pattern. Electronically Signed   By: Rolm Baptise M.D.   On: 11/19/2020 21:29   DG Abd Portable 1V-Small Bowel Obstruction Protocol-24 hr delay  Result Date: 11/19/2020 CLINICAL DATA:  Small-bowel obstruction EXAM: PORTABLE ABDOMEN - 1 VIEW COMPARISON:  11/18/2020 FINDINGS: 2 supine frontal views of the abdomen and pelvis demonstrate enteric catheter tip and side port projecting at the gastroesophageal junction, stable. Distended gas-filled loops of small bowel in the central abdomen are slightly less prominent on this study, measuring up to 3.4 cm in diameter. Gas again seen within the decompressed colon. No masses or abnormal calcifications. IMPRESSION: 1. Persistent gaseous distention of the small bowel, though decreased in prominence since prior study, consistent with improving obstruction. Electronically  Signed   By: Randa Ngo M.D.   On: 11/19/2020 03:45   DG Abd Portable 1V-Small Bowel Obstruction Protocol-initial, 8 hr delay  Result Date: 11/18/2020 CLINICAL DATA:  Small bowel obstruction EXAM: PORTABLE ABDOMEN - 1 VIEW COMPARISON:  11/17/2020 CT FINDINGS: NG tube tip is in the proximal stomach just beyond the GE junction. Side port is in the distal esophagus. Mildly prominent mid abdominal small bowel loops. Findings compatible with small bowel obstruction. Gas seen within the decompressed colon. Prior cholecystectomy. No organomegaly or free air. IMPRESSION: Continued small bowel obstruction pattern. Overall not significantly changed since prior CT. NG tube tip in the proximal stomach near the GE junction. Electronically Signed   By: Rolm Baptise M.D.   On: 11/18/2020 09:15   ECHOCARDIOGRAM COMPLETE  Result Date: 11/17/2020    ECHOCARDIOGRAM REPORT   Patient Name:   RUSTON FEDORA Date of Exam: 11/17/2020 Medical Rec #:  [id number]        Height:       63.0 in Accession #:    [id number]       Weight:       170.6 lb Date of Birth:  December 18, 1933        BSA:          1.807 m  Patient Age:    54 years         BP:           139/68 mmHg Patient Gender: M                HR:           98 bpm. Exam Location:  [facility] Procedure: 2D Echo Indications:    CHF-Acute Diastolic S96.28  History:        Patient has no prior history of Echocardiogram examinations.                 Signs/Symptoms:Fever and Syncope; Risk Factors:Former Smoker.                 Acute Respiratory Failure, GERD, Sepsis.  Sonographer:    Leavy Cella RDCS (AE) Referring Phys: [id number] OLADAPO ADEFESO IMPRESSIONS  1. Poor acoustic windows No definite wall motion abnormalities but endocardium is difficult to see. . Left ventricular ejection fraction, by estimation, is 55 to 60%. The left ventricle has normal function. There is mild left ventricular hypertrophy. Left ventricular diastolic parameters are indeterminate.  2. Right ventricular  systolic function is normal. The right ventricular size is normal.  3. The mitral valve is normal in structure. Trivial mitral valve regurgitation.  4. The aortic valve is tricuspid. Aortic valve regurgitation is not visualized. Mild to moderate aortic valve sclerosis/calcification is present, without any evidence of aortic stenosis.  5. The inferior vena cava is normal in size with greater than 50% respiratory variability, suggesting right atrial pressure of 3 mmHg. FINDINGS  Left Ventricle: Poor acoustic windows No definite wall motion abnormalities but endocardium is difficult to see. Left ventricular ejection fraction, by estimation, is 55 to 60%. The left ventricle has normal function. The left ventricular internal cavity size was normal in size. There is mild left ventricular hypertrophy. Left ventricular diastolic parameters are indeterminate. Right Ventricle: The right ventricular size is normal. Right vetricular wall thickness was not assessed. Right ventricular systolic function is normal. Left Atrium: Left atrial size was normal in size. Right Atrium: Right atrial size was normal in size. Pericardium: There is no evidence of pericardial effusion. Mitral Valve: The mitral valve is normal in structure. There is mild thickening of the mitral valve leaflet(s). Mild mitral annular calcification. Trivial mitral valve regurgitation. Tricuspid Valve: The tricuspid valve is grossly normal. Tricuspid valve regurgitation is not demonstrated. Aortic Valve: The aortic valve is tricuspid. Aortic valve regurgitation is not visualized. Mild to moderate aortic valve sclerosis/calcification is present, without any evidence of aortic stenosis. Pulmonic Valve: The pulmonic valve was normal in structure. Pulmonic valve regurgitation is not visualized. Aorta: The aortic root is normal in size and structure. Venous: The inferior vena cava is normal in size with greater than 50% respiratory variability, suggesting right atrial  pressure of 3 mmHg. IAS/Shunts: The interatrial septum was not assessed.  LEFT VENTRICLE PLAX 2D LVIDd:         3.57 cm  Diastology LVIDs:         2.39 cm  LV e' medial:    11.20 cm/s LV PW:         1.06 cm  LV E/e' medial:  11.5 LV IVS:        1.28 cm  LV e' lateral:   16.60 cm/s LVOT diam:     1.80 cm  LV E/e' lateral: 7.8 LVOT Area:     2.54 cm  RIGHT VENTRICLE RV S prime:  11.80 cm/s TAPSE (M-mode): 1.1 cm LEFT ATRIUM             Index       RIGHT ATRIUM          Index LA diam:        2.50 cm 1.38 cm/m  RA Area:     8.84 cm LA Vol (A2C):   41.6 ml 23.02 ml/m RA Volume:   16.30 ml 9.02 ml/m LA Vol (A4C):   30.7 ml 16.99 ml/m LA Biplane Vol: 37.5 ml 20.75 ml/m   AORTA Ao Root diam: 3.40 cm MITRAL VALVE MV Area (PHT): 3.85 cm     SHUNTS MV Decel Time: 197 msec     Systemic Diam: 1.80 cm MV E velocity: 129.00 cm/s Dorris Carnes MD Electronically signed by Dorris Carnes MD Signature Date/Time: 11/17/2020/6:42:44 PM    Final    VAS [place] UPPER EXTREMITY VENOUS DUPLEX  Result Date: 12/01/2020 UPPER VENOUS STUDY  Indications: Swelling and redness at IV site Comparison Study: No prior studies. Performing Technologist: Darlin Coco RDMS,RVT  Examination Guidelines: A complete evaluation includes B-mode imaging, spectral Doppler, color Doppler, and power Doppler as needed of all accessible portions of each vessel. Bilateral testing is considered an integral part of a complete examination. Limited examinations for reoccurring indications may be performed as noted.  Right Findings: +----------+------------+---------+-----------+----------+-------+ RIGHT     CompressiblePhasicitySpontaneousPropertiesSummary +----------+------------+---------+-----------+----------+-------+ Subclavian    Full       Yes       Yes                      +----------+------------+---------+-----------+----------+-------+  Left Findings: +----------+------------+---------+-----------+----------+-------+ LEFT       CompressiblePhasicitySpontaneousPropertiesSummary +----------+------------+---------+-----------+----------+-------+ IJV           Full       Yes       Yes                      +----------+------------+---------+-----------+----------+-------+ Subclavian    Full       Yes       Yes                      +----------+------------+---------+-----------+----------+-------+ Axillary      Full       Yes       Yes                      +----------+------------+---------+-----------+----------+-------+ Brachial      Full                                          +----------+------------+---------+-----------+----------+-------+ Radial        Full                                          +----------+------------+---------+-----------+----------+-------+ Ulnar         Full                                          +----------+------------+---------+-----------+----------+-------+ Cephalic      None       No        No  Acute  +----------+------------+---------+-----------+----------+-------+ Basilic       Full                                          +----------+------------+---------+-----------+----------+-------+  Summary:  Right: No evidence of thrombosis in the subclavian.  Left: No evidence of deep vein thrombosis in the upper extremity. Findings consistent with acute superficial vein thrombosis involving the left cephalic vein.  Incidental: Appearance of edematous tendon thickening at the lateral (radial) aspect of the wrist.  *See table(s) above for measurements and observations.  Diagnosing physician: Harold Barban MD Electronically signed by Harold Barban MD on 12/01/2020 at 10:42:58 PM.    Final    [place] EKG SITE RITE  Result Date: 11/22/2020 If Site Rite image not attached, placement could not be confirmed due to current cardiac rhythm.  US Abdomen Limited RUQ (LIVER/GB)  Result Date: 11/27/2020 CLINICAL DATA:  87 year old male with  elevated LFTs. History of cholecystectomy. EXAM: ULTRASOUND ABDOMEN LIMITED RIGHT UPPER QUADRANT COMPARISON:  11/17/2020 CT and prior studies FINDINGS: Gallbladder: No gallstones or wall thickening visualized. No sonographic Murphy sign noted by sonographer. Common bile duct: Diameter: 3 mm.  No intrahepatic or extrahepatic biliary dilatation. Liver: No focal lesion identified. Within normal limits in parenchymal echogenicity. Portal vein is patent on color Doppler imaging with normal direction of blood flow towards the liver. Other: None. IMPRESSION: Unremarkable liver.  No biliary dilatation. Status post cholecystectomy. Electronically Signed   By: Margarette Canada M.D.   On: 11/27/2020 10:44       Subjective: He is feeling better. He fed himself today, sitting up in chair. Has occasional coughing spells after eating.  Discharge Exam: Vitals:   12/04/20 1601 12/04/20 2148 12/05/20 0436 12/05/20 1406  BP: (!) 106/52 119/61 129/61 (!) 113/58  Pulse: 76 76 74 78  Resp: 18 17 17  (!) 22  Temp: 99 F (37.2 C) 99.5 F (37.5 C) 98 F (36.7 C) 99.9 F (37.7 C)  TempSrc: Oral   Oral  SpO2: 99% 98% 97% 100%  Weight:      Height:        General: Pt is alert, awake, not in acute distress Cardiovascular: RRR, S1/S2 +, no rubs, no gallops Respiratory: CTA bilaterally, no wheezing, no rhonchi Abdominal: Soft, NT, ND, bowel sounds + Extremities: 1+ edema, no cyanosis    The results of significant diagnostics from this hospitalization (including imaging, microbiology, ancillary and laboratory) are listed below for reference.     Microbiology: No results found for this or any previous visit (from the past 240 hour(s)).   Labs: BNP (last 3 results) Recent Labs    11/17/20 0203  BNP 119.4*   Basic Metabolic Panel: Recent Labs  Lab 12/02/20 0158 12/03/20 0144 12/03/20 1704 12/04/20 0016 12/05/20 0136  NA 136 135 134* 136 136  K 3.5 3.4* 3.5 3.3* 3.6  CL 99 98 96* 98 97*  CO2 29 29 30  29 30   GLUCOSE 110* 121* 156* 161* 135*  BUN 11 13 13 13 14   CREATININE 1.89* 1.84* 1.78* 1.81* 1.77*  CALCIUM 7.6* 7.2* 7.1* 7.2* 7.3*  PHOS 3.3 2.9  --   --   --    Liver Function Tests: Recent Labs  Lab 11/29/20 1048 11/30/20 0133 12/01/20 0102 12/02/20 0158 12/03/20 0144  AST 26 19 19   --   --   ALT 80* 64* 54*  --   --  ALKPHOS 80 74 84  --   --   BILITOT 0.5 0.5 0.6  --   --   PROT 5.7* 5.6* 6.1*  --   --   ALBUMIN 2.3* 2.1* 2.2* 3.2* 2.9*   No results for input(s): LIPASE, AMYLASE in the last 168 hours. Recent Labs  Lab 12/03/20 1704  AMMONIA 10   CBC: Recent Labs  Lab 11/29/20 1048 12/03/20 0144 12/04/20 0016  WBC 10.3 8.0 8.2  HGB 9.6* 8.6* 8.8*  HCT 30.4* 26.8* 27.8*  MCV 80.9 79.5* 79.7*  PLT 267 306 346   Cardiac Enzymes: No results for input(s): CKTOTAL, CKMB, CKMBINDEX, TROPONINI in the last 168 hours. BNP: Invalid input(s): POCBNP CBG: No results for input(s): GLUCAP in the last 168 hours. D-Dimer No results for input(s): DDIMER in the last 72 hours. Hgb A1c No results for input(s): HGBA1C in the last 72 hours. Lipid Profile No results for input(s): CHOL, HDL, LDLCALC, TRIG, CHOLHDL, LDLDIRECT in the last 72 hours. Thyroid function studies No results for input(s): TSH, T4TOTAL, T3FREE, THYROIDAB in the last 72 hours.  Invalid input(s): FREET3 Anemia work up No results for input(s): VITAMINB12, FOLATE, FERRITIN, TIBC, IRON, RETICCTPCT in the last 72 hours. Urinalysis    Component Value Date/Time   COLORURINE STRAW (A) 12/03/2020 1800   APPEARANCEUR CLEAR 12/03/2020 1800   LABSPEC 1.004 (L) 12/03/2020 1800   PHURINE 5.0 12/03/2020 1800   GLUCOSEU NEGATIVE 12/03/2020 1800   HGBUR SMALL (A) 12/03/2020 1800   BILIRUBINUR NEGATIVE 12/03/2020 1800   KETONESUR NEGATIVE 12/03/2020 1800   PROTEINUR NEGATIVE 12/03/2020 1800   UROBILINOGEN 0.2 06/18/2015 2151   NITRITE NEGATIVE 12/03/2020 1800   LEUKOCYTESUR NEGATIVE 12/03/2020 1800    Sepsis Labs Invalid input(s): PROCALCITONIN,  WBC,  LACTICIDVEN Microbiology No results found for this or any previous visit (from the past 240 hour(s)).   Time coordinating discharge: 67mins  SIGNED:   Kathie Dike, MD  [facility] 12/05/2020, 2:26 PM   If 7PM-7AM, please contact night-coverage [email or web address]

## 2020-12-07 DIAGNOSIS — E039 Hypothyroidism, unspecified: Secondary | ICD-10-CM | POA: Diagnosis not present

## 2020-12-07 DIAGNOSIS — R600 Localized edema: Secondary | ICD-10-CM | POA: Diagnosis not present

## 2020-12-07 DIAGNOSIS — I700 Atherosclerosis of aorta: Secondary | ICD-10-CM | POA: Diagnosis not present

## 2020-12-07 DIAGNOSIS — I10 Essential (primary) hypertension: Secondary | ICD-10-CM | POA: Diagnosis not present

## 2020-12-09 DIAGNOSIS — D631 Anemia in chronic kidney disease: Secondary | ICD-10-CM | POA: Diagnosis not present

## 2020-12-09 DIAGNOSIS — N400 Enlarged prostate without lower urinary tract symptoms: Secondary | ICD-10-CM | POA: Diagnosis not present

## 2020-12-09 DIAGNOSIS — N179 Acute kidney failure, unspecified: Secondary | ICD-10-CM | POA: Diagnosis not present

## 2020-12-09 DIAGNOSIS — I130 Hypertensive heart and chronic kidney disease with heart failure and stage 1 through stage 4 chronic kidney disease, or unspecified chronic kidney disease: Secondary | ICD-10-CM | POA: Diagnosis not present

## 2020-12-09 DIAGNOSIS — K436 Other and unspecified ventral hernia with obstruction, without gangrene: Secondary | ICD-10-CM | POA: Diagnosis not present

## 2020-12-09 DIAGNOSIS — I5033 Acute on chronic diastolic (congestive) heart failure: Secondary | ICD-10-CM | POA: Diagnosis not present

## 2020-12-09 DIAGNOSIS — J690 Pneumonitis due to inhalation of food and vomit: Secondary | ICD-10-CM | POA: Diagnosis not present

## 2020-12-09 DIAGNOSIS — I700 Atherosclerosis of aorta: Secondary | ICD-10-CM | POA: Diagnosis not present

## 2020-12-09 DIAGNOSIS — N1832 Chronic kidney disease, stage 3b: Secondary | ICD-10-CM | POA: Diagnosis not present

## 2020-12-10 DIAGNOSIS — N400 Enlarged prostate without lower urinary tract symptoms: Secondary | ICD-10-CM | POA: Diagnosis not present

## 2020-12-10 DIAGNOSIS — E039 Hypothyroidism, unspecified: Secondary | ICD-10-CM | POA: Diagnosis not present

## 2020-12-10 DIAGNOSIS — I130 Hypertensive heart and chronic kidney disease with heart failure and stage 1 through stage 4 chronic kidney disease, or unspecified chronic kidney disease: Secondary | ICD-10-CM | POA: Diagnosis not present

## 2020-12-10 DIAGNOSIS — N5201 Erectile dysfunction due to arterial insufficiency: Secondary | ICD-10-CM | POA: Diagnosis not present

## 2020-12-10 DIAGNOSIS — N179 Acute kidney failure, unspecified: Secondary | ICD-10-CM | POA: Diagnosis not present

## 2020-12-10 DIAGNOSIS — J690 Pneumonitis due to inhalation of food and vomit: Secondary | ICD-10-CM | POA: Diagnosis not present

## 2020-12-10 DIAGNOSIS — I10 Essential (primary) hypertension: Secondary | ICD-10-CM | POA: Diagnosis not present

## 2020-12-10 DIAGNOSIS — N1832 Chronic kidney disease, stage 3b: Secondary | ICD-10-CM | POA: Diagnosis not present

## 2020-12-10 DIAGNOSIS — I5033 Acute on chronic diastolic (congestive) heart failure: Secondary | ICD-10-CM | POA: Diagnosis not present

## 2020-12-10 DIAGNOSIS — I700 Atherosclerosis of aorta: Secondary | ICD-10-CM | POA: Diagnosis not present

## 2020-12-10 DIAGNOSIS — D631 Anemia in chronic kidney disease: Secondary | ICD-10-CM | POA: Diagnosis not present

## 2020-12-10 DIAGNOSIS — N401 Enlarged prostate with lower urinary tract symptoms: Secondary | ICD-10-CM | POA: Diagnosis not present

## 2020-12-10 DIAGNOSIS — K436 Other and unspecified ventral hernia with obstruction, without gangrene: Secondary | ICD-10-CM | POA: Diagnosis not present

## 2020-12-13 DIAGNOSIS — N400 Enlarged prostate without lower urinary tract symptoms: Secondary | ICD-10-CM | POA: Diagnosis not present

## 2020-12-13 DIAGNOSIS — J690 Pneumonitis due to inhalation of food and vomit: Secondary | ICD-10-CM | POA: Diagnosis not present

## 2020-12-13 DIAGNOSIS — I700 Atherosclerosis of aorta: Secondary | ICD-10-CM | POA: Diagnosis not present

## 2020-12-13 DIAGNOSIS — I5033 Acute on chronic diastolic (congestive) heart failure: Secondary | ICD-10-CM | POA: Diagnosis not present

## 2020-12-13 DIAGNOSIS — D631 Anemia in chronic kidney disease: Secondary | ICD-10-CM | POA: Diagnosis not present

## 2020-12-13 DIAGNOSIS — I130 Hypertensive heart and chronic kidney disease with heart failure and stage 1 through stage 4 chronic kidney disease, or unspecified chronic kidney disease: Secondary | ICD-10-CM | POA: Diagnosis not present

## 2020-12-13 DIAGNOSIS — N1832 Chronic kidney disease, stage 3b: Secondary | ICD-10-CM | POA: Diagnosis not present

## 2020-12-13 DIAGNOSIS — K436 Other and unspecified ventral hernia with obstruction, without gangrene: Secondary | ICD-10-CM | POA: Diagnosis not present

## 2020-12-13 DIAGNOSIS — N179 Acute kidney failure, unspecified: Secondary | ICD-10-CM | POA: Diagnosis not present

## 2020-12-15 DIAGNOSIS — I5033 Acute on chronic diastolic (congestive) heart failure: Secondary | ICD-10-CM | POA: Diagnosis not present

## 2020-12-15 DIAGNOSIS — N1832 Chronic kidney disease, stage 3b: Secondary | ICD-10-CM | POA: Diagnosis not present

## 2020-12-15 DIAGNOSIS — K436 Other and unspecified ventral hernia with obstruction, without gangrene: Secondary | ICD-10-CM | POA: Diagnosis not present

## 2020-12-15 DIAGNOSIS — N179 Acute kidney failure, unspecified: Secondary | ICD-10-CM | POA: Diagnosis not present

## 2020-12-15 DIAGNOSIS — I700 Atherosclerosis of aorta: Secondary | ICD-10-CM | POA: Diagnosis not present

## 2020-12-15 DIAGNOSIS — N400 Enlarged prostate without lower urinary tract symptoms: Secondary | ICD-10-CM | POA: Diagnosis not present

## 2020-12-15 DIAGNOSIS — D631 Anemia in chronic kidney disease: Secondary | ICD-10-CM | POA: Diagnosis not present

## 2020-12-15 DIAGNOSIS — J690 Pneumonitis due to inhalation of food and vomit: Secondary | ICD-10-CM | POA: Diagnosis not present

## 2020-12-15 DIAGNOSIS — I130 Hypertensive heart and chronic kidney disease with heart failure and stage 1 through stage 4 chronic kidney disease, or unspecified chronic kidney disease: Secondary | ICD-10-CM | POA: Diagnosis not present

## 2020-12-20 DIAGNOSIS — J690 Pneumonitis due to inhalation of food and vomit: Secondary | ICD-10-CM | POA: Diagnosis not present

## 2020-12-20 DIAGNOSIS — N1832 Chronic kidney disease, stage 3b: Secondary | ICD-10-CM | POA: Diagnosis not present

## 2020-12-20 DIAGNOSIS — I700 Atherosclerosis of aorta: Secondary | ICD-10-CM | POA: Diagnosis not present

## 2020-12-20 DIAGNOSIS — I5033 Acute on chronic diastolic (congestive) heart failure: Secondary | ICD-10-CM | POA: Diagnosis not present

## 2020-12-20 DIAGNOSIS — K436 Other and unspecified ventral hernia with obstruction, without gangrene: Secondary | ICD-10-CM | POA: Diagnosis not present

## 2020-12-20 DIAGNOSIS — I130 Hypertensive heart and chronic kidney disease with heart failure and stage 1 through stage 4 chronic kidney disease, or unspecified chronic kidney disease: Secondary | ICD-10-CM | POA: Diagnosis not present

## 2020-12-20 DIAGNOSIS — N179 Acute kidney failure, unspecified: Secondary | ICD-10-CM | POA: Diagnosis not present

## 2020-12-20 DIAGNOSIS — D631 Anemia in chronic kidney disease: Secondary | ICD-10-CM | POA: Diagnosis not present

## 2020-12-20 DIAGNOSIS — N400 Enlarged prostate without lower urinary tract symptoms: Secondary | ICD-10-CM | POA: Diagnosis not present

## 2020-12-21 DIAGNOSIS — D631 Anemia in chronic kidney disease: Secondary | ICD-10-CM | POA: Diagnosis not present

## 2020-12-21 DIAGNOSIS — I5033 Acute on chronic diastolic (congestive) heart failure: Secondary | ICD-10-CM | POA: Diagnosis not present

## 2020-12-21 DIAGNOSIS — N1832 Chronic kidney disease, stage 3b: Secondary | ICD-10-CM | POA: Diagnosis not present

## 2020-12-21 DIAGNOSIS — K436 Other and unspecified ventral hernia with obstruction, without gangrene: Secondary | ICD-10-CM | POA: Diagnosis not present

## 2020-12-21 DIAGNOSIS — I130 Hypertensive heart and chronic kidney disease with heart failure and stage 1 through stage 4 chronic kidney disease, or unspecified chronic kidney disease: Secondary | ICD-10-CM | POA: Diagnosis not present

## 2020-12-21 DIAGNOSIS — N179 Acute kidney failure, unspecified: Secondary | ICD-10-CM | POA: Diagnosis not present

## 2020-12-21 DIAGNOSIS — N400 Enlarged prostate without lower urinary tract symptoms: Secondary | ICD-10-CM | POA: Diagnosis not present

## 2020-12-21 DIAGNOSIS — I700 Atherosclerosis of aorta: Secondary | ICD-10-CM | POA: Diagnosis not present

## 2020-12-21 DIAGNOSIS — J690 Pneumonitis due to inhalation of food and vomit: Secondary | ICD-10-CM | POA: Diagnosis not present

## 2020-12-23 ENCOUNTER — Ambulatory Visit: Payer: Medicare PPO | Admitting: Cardiology

## 2020-12-23 DIAGNOSIS — J690 Pneumonitis due to inhalation of food and vomit: Secondary | ICD-10-CM | POA: Diagnosis not present

## 2020-12-23 DIAGNOSIS — N179 Acute kidney failure, unspecified: Secondary | ICD-10-CM | POA: Diagnosis not present

## 2020-12-23 DIAGNOSIS — N400 Enlarged prostate without lower urinary tract symptoms: Secondary | ICD-10-CM | POA: Diagnosis not present

## 2020-12-23 DIAGNOSIS — N1832 Chronic kidney disease, stage 3b: Secondary | ICD-10-CM | POA: Diagnosis not present

## 2020-12-23 DIAGNOSIS — I5033 Acute on chronic diastolic (congestive) heart failure: Secondary | ICD-10-CM | POA: Diagnosis not present

## 2020-12-23 DIAGNOSIS — I130 Hypertensive heart and chronic kidney disease with heart failure and stage 1 through stage 4 chronic kidney disease, or unspecified chronic kidney disease: Secondary | ICD-10-CM | POA: Diagnosis not present

## 2020-12-23 DIAGNOSIS — K436 Other and unspecified ventral hernia with obstruction, without gangrene: Secondary | ICD-10-CM | POA: Diagnosis not present

## 2020-12-23 DIAGNOSIS — D631 Anemia in chronic kidney disease: Secondary | ICD-10-CM | POA: Diagnosis not present

## 2020-12-23 DIAGNOSIS — I700 Atherosclerosis of aorta: Secondary | ICD-10-CM | POA: Diagnosis not present

## 2020-12-27 DIAGNOSIS — D631 Anemia in chronic kidney disease: Secondary | ICD-10-CM | POA: Diagnosis not present

## 2020-12-27 DIAGNOSIS — I130 Hypertensive heart and chronic kidney disease with heart failure and stage 1 through stage 4 chronic kidney disease, or unspecified chronic kidney disease: Secondary | ICD-10-CM | POA: Diagnosis not present

## 2020-12-27 DIAGNOSIS — N1832 Chronic kidney disease, stage 3b: Secondary | ICD-10-CM | POA: Diagnosis not present

## 2020-12-27 DIAGNOSIS — I5033 Acute on chronic diastolic (congestive) heart failure: Secondary | ICD-10-CM | POA: Diagnosis not present

## 2020-12-27 DIAGNOSIS — N179 Acute kidney failure, unspecified: Secondary | ICD-10-CM | POA: Diagnosis not present

## 2020-12-27 DIAGNOSIS — I700 Atherosclerosis of aorta: Secondary | ICD-10-CM | POA: Diagnosis not present

## 2020-12-27 DIAGNOSIS — N400 Enlarged prostate without lower urinary tract symptoms: Secondary | ICD-10-CM | POA: Diagnosis not present

## 2020-12-27 DIAGNOSIS — J690 Pneumonitis due to inhalation of food and vomit: Secondary | ICD-10-CM | POA: Diagnosis not present

## 2020-12-27 DIAGNOSIS — K436 Other and unspecified ventral hernia with obstruction, without gangrene: Secondary | ICD-10-CM | POA: Diagnosis not present

## 2020-12-28 DIAGNOSIS — N179 Acute kidney failure, unspecified: Secondary | ICD-10-CM | POA: Diagnosis not present

## 2020-12-28 DIAGNOSIS — N1832 Chronic kidney disease, stage 3b: Secondary | ICD-10-CM | POA: Diagnosis not present

## 2020-12-28 DIAGNOSIS — J690 Pneumonitis due to inhalation of food and vomit: Secondary | ICD-10-CM | POA: Diagnosis not present

## 2020-12-28 DIAGNOSIS — I5033 Acute on chronic diastolic (congestive) heart failure: Secondary | ICD-10-CM | POA: Diagnosis not present

## 2020-12-28 DIAGNOSIS — N400 Enlarged prostate without lower urinary tract symptoms: Secondary | ICD-10-CM | POA: Diagnosis not present

## 2020-12-28 DIAGNOSIS — K436 Other and unspecified ventral hernia with obstruction, without gangrene: Secondary | ICD-10-CM | POA: Diagnosis not present

## 2020-12-28 DIAGNOSIS — I130 Hypertensive heart and chronic kidney disease with heart failure and stage 1 through stage 4 chronic kidney disease, or unspecified chronic kidney disease: Secondary | ICD-10-CM | POA: Diagnosis not present

## 2020-12-28 DIAGNOSIS — I700 Atherosclerosis of aorta: Secondary | ICD-10-CM | POA: Diagnosis not present

## 2020-12-28 DIAGNOSIS — D631 Anemia in chronic kidney disease: Secondary | ICD-10-CM | POA: Diagnosis not present

## 2020-12-30 DIAGNOSIS — N179 Acute kidney failure, unspecified: Secondary | ICD-10-CM | POA: Diagnosis not present

## 2020-12-30 DIAGNOSIS — J690 Pneumonitis due to inhalation of food and vomit: Secondary | ICD-10-CM | POA: Diagnosis not present

## 2020-12-30 DIAGNOSIS — N1832 Chronic kidney disease, stage 3b: Secondary | ICD-10-CM | POA: Diagnosis not present

## 2020-12-30 DIAGNOSIS — K436 Other and unspecified ventral hernia with obstruction, without gangrene: Secondary | ICD-10-CM | POA: Diagnosis not present

## 2020-12-30 DIAGNOSIS — I130 Hypertensive heart and chronic kidney disease with heart failure and stage 1 through stage 4 chronic kidney disease, or unspecified chronic kidney disease: Secondary | ICD-10-CM | POA: Diagnosis not present

## 2020-12-30 DIAGNOSIS — D631 Anemia in chronic kidney disease: Secondary | ICD-10-CM | POA: Diagnosis not present

## 2020-12-30 DIAGNOSIS — I5033 Acute on chronic diastolic (congestive) heart failure: Secondary | ICD-10-CM | POA: Diagnosis not present

## 2020-12-30 DIAGNOSIS — I700 Atherosclerosis of aorta: Secondary | ICD-10-CM | POA: Diagnosis not present

## 2020-12-30 DIAGNOSIS — N400 Enlarged prostate without lower urinary tract symptoms: Secondary | ICD-10-CM | POA: Diagnosis not present

## 2021-01-03 DIAGNOSIS — I700 Atherosclerosis of aorta: Secondary | ICD-10-CM | POA: Diagnosis not present

## 2021-01-03 DIAGNOSIS — N1832 Chronic kidney disease, stage 3b: Secondary | ICD-10-CM | POA: Diagnosis not present

## 2021-01-03 DIAGNOSIS — N400 Enlarged prostate without lower urinary tract symptoms: Secondary | ICD-10-CM | POA: Diagnosis not present

## 2021-01-03 DIAGNOSIS — N179 Acute kidney failure, unspecified: Secondary | ICD-10-CM | POA: Diagnosis not present

## 2021-01-03 DIAGNOSIS — I130 Hypertensive heart and chronic kidney disease with heart failure and stage 1 through stage 4 chronic kidney disease, or unspecified chronic kidney disease: Secondary | ICD-10-CM | POA: Diagnosis not present

## 2021-01-03 DIAGNOSIS — J690 Pneumonitis due to inhalation of food and vomit: Secondary | ICD-10-CM | POA: Diagnosis not present

## 2021-01-03 DIAGNOSIS — K436 Other and unspecified ventral hernia with obstruction, without gangrene: Secondary | ICD-10-CM | POA: Diagnosis not present

## 2021-01-03 DIAGNOSIS — I5033 Acute on chronic diastolic (congestive) heart failure: Secondary | ICD-10-CM | POA: Diagnosis not present

## 2021-01-03 DIAGNOSIS — D631 Anemia in chronic kidney disease: Secondary | ICD-10-CM | POA: Diagnosis not present

## 2021-01-03 NOTE — Progress Notes (Signed)
History of Present Illness: Here for followup of BPH as well as gross hematuria. On finasteride as well as tamsulosin.  His last visit here was in July 2020.  Time overall he has been doing well.  He denies voiding symptoms.  He has had no blood in his urine.  He tolerates both the finasteride and tamsulosin well.  IPSS Questionnaire (AUA-7): Over the past month.   1)  How often have you had a sensation of not emptying your bladder completely after you finish urinating?  1 - Less than 1 time in 5  2)  How often have you had to urinate again less than two hours after you finished urinating? 0 - Not at all  3)  How often have you found you stopped and started again several times when you urinated?  0 - Not at all  4) How difficult have you found it to postpone urination?  1 - Less than 1 time in 5  5) How often have you had a weak urinary stream?  0 - Not at all  6) How often have you had to push or strain to begin urination?  0 - Not at all  7) How many times did you most typically get up to urinate from the time you went to bed until the time you got up in the morning?  2 - 2 times  Total score:  0-7 mildly symptomatic   8-19 moderately symptomatic   20-35 severely symptomatic     Past Medical History:  Diagnosis Date  . Blood transfusion without reported diagnosis   . Chronic anemia   . Enlarged prostate    Elevated PSA  . Esophagitis    High grade squamous dysplasia occurring in the background of chronic active esophagitis,, EGD Dr. Christoper Fabian benign biopsies February 2005  . GERD (gastroesophageal reflux disease)   . History of BPH    s/p TURP 2008  . Hypertension   . Ileus ([place])   . Mallory - Weiss tear   . Thyroid disease   . Upper GI bleed     Past Surgical History:  Procedure Laterality Date  . CENTRAL VENOUS CATHETER INSERTION  04/26/2012   Procedure: INSERTION CENTRAL LINE ADULT;  Surgeon: Donato Heinz, MD;  Location: [facility];  Service: General;  Laterality: Left;   started at 0919, ended at 0930,        subclavian  . CHOLECYSTECTOMY    . COLONOSCOPY  10/29/03   rourk-inflammatory polyp from 40 cm removed  . COLONOSCOPY  10/02/2012   Dr. Gala Romney: normal  . ESOPHAGOGASTRODUODENOSCOPY  08/31/08   Rourk-possible extrinsic impression on midesophagus, noncritical Schatzki's ring, 3 mm fundal gland polyp, multiple gastric fundic polyps not manipulated, small hiatal hernia : CT chest showed mediastinal mass, eventually underwent thyroidectomy  . ESOPHAGOGASTRODUODENOSCOPY  08/27/2012   Dr. Cristina Gong: moderate sized hiatal hernia, cameron lesions  . LAPAROTOMY  04/26/2012   Procedure: EXPLORATORY LAPAROTOMY;  Surgeon: Donato Heinz, MD;  Location: [facility];  Service: General;  Laterality: N/A;  . THYROIDECTOMY    . TRANSURETHRAL RESECTION OF PROSTATE     pt denies, but this is listed in his past medical records.     Home Medications:  Allergies as of 01/04/2021   No Known Allergies     Medication List       Accurate as of Jan 03, 2021  7:39 PM. If you have any questions, ask your nurse or doctor.  chlorproMAZINE 10 MG tablet Commonly known as: THORAZINE Take 10 mg by mouth every 6 (six) hours as needed for hiccoughs.   diltiazem 180 MG 24 hr capsule Commonly known as: CARDIZEM CD Take 180 mg by mouth daily.   finasteride 5 MG tablet Commonly known as: PROSCAR Take 5 mg by mouth daily.   guaiFENesin-dextromethorphan 100-10 MG/5ML syrup Commonly known as: ROBITUSSIN DM Take 10 mLs by mouth every 6 (six) hours.   HYDROcodone-acetaminophen 5-325 MG tablet Commonly known as: NORCO/VICODIN Take 1 tablet by mouth every 6 (six) hours as needed for severe pain.   levothyroxine 112 MCG tablet Commonly known as: SYNTHROID Take 112 mcg by mouth daily before breakfast.   pantoprazole 40 MG tablet Commonly known as: PROTONIX Take 1 tablet (40 mg total) by mouth daily.   potassium chloride SA 20 MEQ tablet Commonly known as: KLOR-CON Take 20 mEq  by mouth daily.   tamsulosin 0.4 MG Caps capsule Commonly known as: FLOMAX Take 0.4 mg by mouth daily.   Torsemide 40 MG Tabs Take 40 mg by mouth daily.       Allergies: No Known Allergies  Family History  Problem Relation Age of Onset  . Cancer Other   . Heart failure Other   . Liver disease Sister   . Breast cancer Sister   . Hypertension Mother   . Stroke Father   . Colon cancer Neg Hx     Social History:  reports that he quit smoking about 28 years ago. His smoking use included cigarettes. He smoked 0.50 packs per day. He has never used smokeless tobacco. He reports that he does not drink alcohol and does not use drugs.  ROS: A complete review of systems was performed.  All systems are negative except for pertinent findings as noted.  Physical Exam:  Vital signs in last 24 hours: There were no vitals taken for this visit. Constitutional:  Alert and oriented, No acute distress Cardiovascular: Regular rate  Respiratory: Normal respiratory effort GI: Abdomen is soft, nontender, nondistended, no abdominal masses. No CVAT.  Genitourinary: Normal male phallus, testes are descended bilaterally and non-tender and without masses, scrotum is normal in appearance without lesions or masses, perineum is normal on inspection.  Prostate is 60 g, symmetrical, nonnodular, nontender Lymphatic: No lymphadenopathy Neurologic: Grossly intact, no focal deficits Psychiatric: Normal mood and affect  I have reviewed prior pt notes  I have reviewed notes from referring/previous physicians  I have reviewed urinalysis results       Assessment: BPH, doing well on dual medical therapy  History of gross hematuria, no evidence of recurrence recently  Plan:  He will continue on dual medical therapy  I will see him back in 1 year for repeat check

## 2021-01-04 ENCOUNTER — Ambulatory Visit (INDEPENDENT_AMBULATORY_CARE_PROVIDER_SITE_OTHER): Payer: Medicare PPO | Admitting: Urology

## 2021-01-04 ENCOUNTER — Encounter: Payer: Self-pay | Admitting: Urology

## 2021-01-04 ENCOUNTER — Other Ambulatory Visit: Payer: Self-pay

## 2021-01-04 VITALS — BP 97/63 | HR 72 | Wt 165.2 lb

## 2021-01-04 DIAGNOSIS — K436 Other and unspecified ventral hernia with obstruction, without gangrene: Secondary | ICD-10-CM | POA: Diagnosis not present

## 2021-01-04 DIAGNOSIS — N401 Enlarged prostate with lower urinary tract symptoms: Secondary | ICD-10-CM

## 2021-01-04 DIAGNOSIS — N400 Enlarged prostate without lower urinary tract symptoms: Secondary | ICD-10-CM | POA: Diagnosis not present

## 2021-01-04 DIAGNOSIS — I5033 Acute on chronic diastolic (congestive) heart failure: Secondary | ICD-10-CM | POA: Diagnosis not present

## 2021-01-04 DIAGNOSIS — R339 Retention of urine, unspecified: Secondary | ICD-10-CM | POA: Diagnosis not present

## 2021-01-04 DIAGNOSIS — R3914 Feeling of incomplete bladder emptying: Secondary | ICD-10-CM | POA: Diagnosis not present

## 2021-01-04 DIAGNOSIS — N179 Acute kidney failure, unspecified: Secondary | ICD-10-CM | POA: Diagnosis not present

## 2021-01-04 DIAGNOSIS — I700 Atherosclerosis of aorta: Secondary | ICD-10-CM | POA: Diagnosis not present

## 2021-01-04 DIAGNOSIS — D631 Anemia in chronic kidney disease: Secondary | ICD-10-CM | POA: Diagnosis not present

## 2021-01-04 DIAGNOSIS — I130 Hypertensive heart and chronic kidney disease with heart failure and stage 1 through stage 4 chronic kidney disease, or unspecified chronic kidney disease: Secondary | ICD-10-CM | POA: Diagnosis not present

## 2021-01-04 DIAGNOSIS — J690 Pneumonitis due to inhalation of food and vomit: Secondary | ICD-10-CM | POA: Diagnosis not present

## 2021-01-04 DIAGNOSIS — N1832 Chronic kidney disease, stage 3b: Secondary | ICD-10-CM | POA: Diagnosis not present

## 2021-01-04 NOTE — Progress Notes (Signed)

## 2021-01-06 DIAGNOSIS — N179 Acute kidney failure, unspecified: Secondary | ICD-10-CM | POA: Diagnosis not present

## 2021-01-06 DIAGNOSIS — N1832 Chronic kidney disease, stage 3b: Secondary | ICD-10-CM | POA: Diagnosis not present

## 2021-01-06 DIAGNOSIS — D631 Anemia in chronic kidney disease: Secondary | ICD-10-CM | POA: Diagnosis not present

## 2021-01-06 DIAGNOSIS — N400 Enlarged prostate without lower urinary tract symptoms: Secondary | ICD-10-CM | POA: Diagnosis not present

## 2021-01-06 DIAGNOSIS — J690 Pneumonitis due to inhalation of food and vomit: Secondary | ICD-10-CM | POA: Diagnosis not present

## 2021-01-06 DIAGNOSIS — I5033 Acute on chronic diastolic (congestive) heart failure: Secondary | ICD-10-CM | POA: Diagnosis not present

## 2021-01-06 DIAGNOSIS — I130 Hypertensive heart and chronic kidney disease with heart failure and stage 1 through stage 4 chronic kidney disease, or unspecified chronic kidney disease: Secondary | ICD-10-CM | POA: Diagnosis not present

## 2021-01-06 DIAGNOSIS — I10 Essential (primary) hypertension: Secondary | ICD-10-CM | POA: Diagnosis not present

## 2021-01-06 DIAGNOSIS — E039 Hypothyroidism, unspecified: Secondary | ICD-10-CM | POA: Diagnosis not present

## 2021-01-06 DIAGNOSIS — I700 Atherosclerosis of aorta: Secondary | ICD-10-CM | POA: Diagnosis not present

## 2021-01-06 DIAGNOSIS — K436 Other and unspecified ventral hernia with obstruction, without gangrene: Secondary | ICD-10-CM | POA: Diagnosis not present

## 2021-01-10 DIAGNOSIS — I130 Hypertensive heart and chronic kidney disease with heart failure and stage 1 through stage 4 chronic kidney disease, or unspecified chronic kidney disease: Secondary | ICD-10-CM | POA: Diagnosis not present

## 2021-01-10 DIAGNOSIS — N401 Enlarged prostate with lower urinary tract symptoms: Secondary | ICD-10-CM | POA: Diagnosis not present

## 2021-01-10 DIAGNOSIS — N179 Acute kidney failure, unspecified: Secondary | ICD-10-CM | POA: Diagnosis not present

## 2021-01-10 DIAGNOSIS — N400 Enlarged prostate without lower urinary tract symptoms: Secondary | ICD-10-CM | POA: Diagnosis not present

## 2021-01-10 DIAGNOSIS — J690 Pneumonitis due to inhalation of food and vomit: Secondary | ICD-10-CM | POA: Diagnosis not present

## 2021-01-10 DIAGNOSIS — K436 Other and unspecified ventral hernia with obstruction, without gangrene: Secondary | ICD-10-CM | POA: Diagnosis not present

## 2021-01-10 DIAGNOSIS — I5033 Acute on chronic diastolic (congestive) heart failure: Secondary | ICD-10-CM | POA: Diagnosis not present

## 2021-01-10 DIAGNOSIS — D631 Anemia in chronic kidney disease: Secondary | ICD-10-CM | POA: Diagnosis not present

## 2021-01-10 DIAGNOSIS — Z0001 Encounter for general adult medical examination with abnormal findings: Secondary | ICD-10-CM | POA: Diagnosis not present

## 2021-01-10 DIAGNOSIS — E039 Hypothyroidism, unspecified: Secondary | ICD-10-CM | POA: Diagnosis not present

## 2021-01-10 DIAGNOSIS — N1832 Chronic kidney disease, stage 3b: Secondary | ICD-10-CM | POA: Diagnosis not present

## 2021-01-10 DIAGNOSIS — I10 Essential (primary) hypertension: Secondary | ICD-10-CM | POA: Diagnosis not present

## 2021-01-10 DIAGNOSIS — I700 Atherosclerosis of aorta: Secondary | ICD-10-CM | POA: Diagnosis not present

## 2021-01-10 DIAGNOSIS — N5201 Erectile dysfunction due to arterial insufficiency: Secondary | ICD-10-CM | POA: Diagnosis not present

## 2021-01-11 DIAGNOSIS — K439 Ventral hernia without obstruction or gangrene: Secondary | ICD-10-CM | POA: Diagnosis not present

## 2021-01-11 DIAGNOSIS — I10 Essential (primary) hypertension: Secondary | ICD-10-CM | POA: Diagnosis not present

## 2021-01-11 DIAGNOSIS — N1832 Chronic kidney disease, stage 3b: Secondary | ICD-10-CM | POA: Diagnosis not present

## 2021-01-12 ENCOUNTER — Other Ambulatory Visit (HOSPITAL_COMMUNITY): Payer: Self-pay | Admitting: Gerontology

## 2021-01-12 DIAGNOSIS — N1832 Chronic kidney disease, stage 3b: Secondary | ICD-10-CM

## 2021-01-13 DIAGNOSIS — N1832 Chronic kidney disease, stage 3b: Secondary | ICD-10-CM | POA: Diagnosis not present

## 2021-01-13 DIAGNOSIS — I5033 Acute on chronic diastolic (congestive) heart failure: Secondary | ICD-10-CM | POA: Diagnosis not present

## 2021-01-13 DIAGNOSIS — I130 Hypertensive heart and chronic kidney disease with heart failure and stage 1 through stage 4 chronic kidney disease, or unspecified chronic kidney disease: Secondary | ICD-10-CM | POA: Diagnosis not present

## 2021-01-13 DIAGNOSIS — N400 Enlarged prostate without lower urinary tract symptoms: Secondary | ICD-10-CM | POA: Diagnosis not present

## 2021-01-13 DIAGNOSIS — D631 Anemia in chronic kidney disease: Secondary | ICD-10-CM | POA: Diagnosis not present

## 2021-01-13 DIAGNOSIS — I700 Atherosclerosis of aorta: Secondary | ICD-10-CM | POA: Diagnosis not present

## 2021-01-13 DIAGNOSIS — N179 Acute kidney failure, unspecified: Secondary | ICD-10-CM | POA: Diagnosis not present

## 2021-01-13 DIAGNOSIS — J690 Pneumonitis due to inhalation of food and vomit: Secondary | ICD-10-CM | POA: Diagnosis not present

## 2021-01-13 DIAGNOSIS — K436 Other and unspecified ventral hernia with obstruction, without gangrene: Secondary | ICD-10-CM | POA: Diagnosis not present

## 2021-01-18 DIAGNOSIS — I700 Atherosclerosis of aorta: Secondary | ICD-10-CM | POA: Diagnosis not present

## 2021-01-18 DIAGNOSIS — N179 Acute kidney failure, unspecified: Secondary | ICD-10-CM | POA: Diagnosis not present

## 2021-01-18 DIAGNOSIS — N400 Enlarged prostate without lower urinary tract symptoms: Secondary | ICD-10-CM | POA: Diagnosis not present

## 2021-01-18 DIAGNOSIS — I5033 Acute on chronic diastolic (congestive) heart failure: Secondary | ICD-10-CM | POA: Diagnosis not present

## 2021-01-18 DIAGNOSIS — D631 Anemia in chronic kidney disease: Secondary | ICD-10-CM | POA: Diagnosis not present

## 2021-01-18 DIAGNOSIS — J690 Pneumonitis due to inhalation of food and vomit: Secondary | ICD-10-CM | POA: Diagnosis not present

## 2021-01-18 DIAGNOSIS — K436 Other and unspecified ventral hernia with obstruction, without gangrene: Secondary | ICD-10-CM | POA: Diagnosis not present

## 2021-01-18 DIAGNOSIS — N1832 Chronic kidney disease, stage 3b: Secondary | ICD-10-CM | POA: Diagnosis not present

## 2021-01-18 DIAGNOSIS — I130 Hypertensive heart and chronic kidney disease with heart failure and stage 1 through stage 4 chronic kidney disease, or unspecified chronic kidney disease: Secondary | ICD-10-CM | POA: Diagnosis not present

## 2021-01-19 ENCOUNTER — Other Ambulatory Visit: Payer: Self-pay

## 2021-01-19 ENCOUNTER — Ambulatory Visit (HOSPITAL_COMMUNITY)
Admission: RE | Admit: 2021-01-19 | Discharge: 2021-01-19 | Disposition: A | Discharge status: Home / Self Care | Payer: Medicare PPO | Source: Ambulatory Visit | Attending: Gerontology | Admitting: Gerontology

## 2021-01-19 DIAGNOSIS — N281 Cyst of kidney, acquired: Secondary | ICD-10-CM | POA: Diagnosis not present

## 2021-01-19 DIAGNOSIS — N1832 Chronic kidney disease, stage 3b: Secondary | ICD-10-CM | POA: Diagnosis not present

## 2021-02-10 DIAGNOSIS — Z79899 Other long term (current) drug therapy: Secondary | ICD-10-CM | POA: Diagnosis not present

## 2021-02-10 DIAGNOSIS — I129 Hypertensive chronic kidney disease with stage 1 through stage 4 chronic kidney disease, or unspecified chronic kidney disease: Secondary | ICD-10-CM | POA: Diagnosis not present

## 2021-02-10 DIAGNOSIS — E6609 Other obesity due to excess calories: Secondary | ICD-10-CM | POA: Diagnosis not present

## 2021-02-10 DIAGNOSIS — N1832 Chronic kidney disease, stage 3b: Secondary | ICD-10-CM | POA: Diagnosis not present

## 2021-02-10 DIAGNOSIS — N281 Cyst of kidney, acquired: Secondary | ICD-10-CM | POA: Diagnosis not present

## 2021-02-10 DIAGNOSIS — I517 Cardiomegaly: Secondary | ICD-10-CM | POA: Diagnosis not present

## 2021-02-10 DIAGNOSIS — D638 Anemia in other chronic diseases classified elsewhere: Secondary | ICD-10-CM | POA: Diagnosis not present

## 2021-02-11 DIAGNOSIS — I10 Essential (primary) hypertension: Secondary | ICD-10-CM | POA: Diagnosis not present

## 2021-02-11 DIAGNOSIS — N1832 Chronic kidney disease, stage 3b: Secondary | ICD-10-CM | POA: Diagnosis not present

## 2021-02-28 DIAGNOSIS — E6609 Other obesity due to excess calories: Secondary | ICD-10-CM | POA: Diagnosis not present

## 2021-02-28 DIAGNOSIS — I517 Cardiomegaly: Secondary | ICD-10-CM | POA: Diagnosis not present

## 2021-02-28 DIAGNOSIS — D638 Anemia in other chronic diseases classified elsewhere: Secondary | ICD-10-CM | POA: Diagnosis not present

## 2021-02-28 DIAGNOSIS — I129 Hypertensive chronic kidney disease with stage 1 through stage 4 chronic kidney disease, or unspecified chronic kidney disease: Secondary | ICD-10-CM | POA: Diagnosis not present

## 2021-02-28 DIAGNOSIS — Z79899 Other long term (current) drug therapy: Secondary | ICD-10-CM | POA: Diagnosis not present

## 2021-02-28 DIAGNOSIS — N1832 Chronic kidney disease, stage 3b: Secondary | ICD-10-CM | POA: Diagnosis not present

## 2021-02-28 DIAGNOSIS — N281 Cyst of kidney, acquired: Secondary | ICD-10-CM | POA: Diagnosis not present

## 2021-03-13 DIAGNOSIS — I10 Essential (primary) hypertension: Secondary | ICD-10-CM | POA: Diagnosis not present

## 2021-03-13 DIAGNOSIS — N401 Enlarged prostate with lower urinary tract symptoms: Secondary | ICD-10-CM | POA: Diagnosis not present

## 2021-03-18 DIAGNOSIS — E211 Secondary hyperparathyroidism, not elsewhere classified: Secondary | ICD-10-CM | POA: Diagnosis not present

## 2021-03-18 DIAGNOSIS — N281 Cyst of kidney, acquired: Secondary | ICD-10-CM | POA: Diagnosis not present

## 2021-03-18 DIAGNOSIS — D508 Other iron deficiency anemias: Secondary | ICD-10-CM | POA: Diagnosis not present

## 2021-03-18 DIAGNOSIS — D472 Monoclonal gammopathy: Secondary | ICD-10-CM | POA: Diagnosis not present

## 2021-03-18 DIAGNOSIS — E559 Vitamin D deficiency, unspecified: Secondary | ICD-10-CM | POA: Diagnosis not present

## 2021-03-18 DIAGNOSIS — I129 Hypertensive chronic kidney disease with stage 1 through stage 4 chronic kidney disease, or unspecified chronic kidney disease: Secondary | ICD-10-CM | POA: Diagnosis not present

## 2021-03-18 DIAGNOSIS — N1832 Chronic kidney disease, stage 3b: Secondary | ICD-10-CM | POA: Diagnosis not present

## 2021-03-18 DIAGNOSIS — Z5181 Encounter for therapeutic drug level monitoring: Secondary | ICD-10-CM | POA: Diagnosis not present

## 2021-03-18 DIAGNOSIS — R7303 Prediabetes: Secondary | ICD-10-CM | POA: Diagnosis not present

## 2021-03-24 ENCOUNTER — Encounter: Payer: Self-pay | Admitting: Internal Medicine

## 2021-04-13 DIAGNOSIS — I10 Essential (primary) hypertension: Secondary | ICD-10-CM | POA: Diagnosis not present

## 2021-04-13 DIAGNOSIS — N1832 Chronic kidney disease, stage 3b: Secondary | ICD-10-CM | POA: Diagnosis not present

## 2021-04-20 ENCOUNTER — Ambulatory Visit (HOSPITAL_COMMUNITY): Payer: Medicare PPO | Admitting: Hematology

## 2021-05-14 DIAGNOSIS — I10 Essential (primary) hypertension: Secondary | ICD-10-CM | POA: Diagnosis not present

## 2021-05-14 DIAGNOSIS — N1832 Chronic kidney disease, stage 3b: Secondary | ICD-10-CM | POA: Diagnosis not present

## 2021-05-17 ENCOUNTER — Ambulatory Visit: Payer: Medicare PPO | Admitting: Internal Medicine

## 2021-05-17 ENCOUNTER — Encounter: Payer: Self-pay | Admitting: Internal Medicine

## 2021-05-19 DIAGNOSIS — N281 Cyst of kidney, acquired: Secondary | ICD-10-CM | POA: Diagnosis not present

## 2021-05-19 DIAGNOSIS — E559 Vitamin D deficiency, unspecified: Secondary | ICD-10-CM | POA: Diagnosis not present

## 2021-05-19 DIAGNOSIS — E6609 Other obesity due to excess calories: Secondary | ICD-10-CM | POA: Diagnosis not present

## 2021-05-19 DIAGNOSIS — N1832 Chronic kidney disease, stage 3b: Secondary | ICD-10-CM | POA: Diagnosis not present

## 2021-05-19 DIAGNOSIS — E211 Secondary hyperparathyroidism, not elsewhere classified: Secondary | ICD-10-CM | POA: Diagnosis not present

## 2021-05-19 DIAGNOSIS — D472 Monoclonal gammopathy: Secondary | ICD-10-CM | POA: Diagnosis not present

## 2021-05-19 DIAGNOSIS — R7303 Prediabetes: Secondary | ICD-10-CM | POA: Diagnosis not present

## 2021-05-19 DIAGNOSIS — D508 Other iron deficiency anemias: Secondary | ICD-10-CM | POA: Diagnosis not present

## 2021-05-19 DIAGNOSIS — I129 Hypertensive chronic kidney disease with stage 1 through stage 4 chronic kidney disease, or unspecified chronic kidney disease: Secondary | ICD-10-CM | POA: Diagnosis not present

## 2021-05-25 DIAGNOSIS — D508 Other iron deficiency anemias: Secondary | ICD-10-CM | POA: Diagnosis not present

## 2021-05-25 DIAGNOSIS — E211 Secondary hyperparathyroidism, not elsewhere classified: Secondary | ICD-10-CM | POA: Diagnosis not present

## 2021-05-25 DIAGNOSIS — Z5181 Encounter for therapeutic drug level monitoring: Secondary | ICD-10-CM | POA: Diagnosis not present

## 2021-05-25 DIAGNOSIS — N1832 Chronic kidney disease, stage 3b: Secondary | ICD-10-CM | POA: Diagnosis not present

## 2021-05-25 DIAGNOSIS — E559 Vitamin D deficiency, unspecified: Secondary | ICD-10-CM | POA: Diagnosis not present

## 2021-05-25 DIAGNOSIS — D472 Monoclonal gammopathy: Secondary | ICD-10-CM | POA: Diagnosis not present

## 2021-05-25 DIAGNOSIS — I129 Hypertensive chronic kidney disease with stage 1 through stage 4 chronic kidney disease, or unspecified chronic kidney disease: Secondary | ICD-10-CM | POA: Diagnosis not present

## 2021-05-26 ENCOUNTER — Encounter: Payer: Self-pay | Admitting: Internal Medicine

## 2021-06-21 DIAGNOSIS — E039 Hypothyroidism, unspecified: Secondary | ICD-10-CM | POA: Diagnosis not present

## 2021-06-21 DIAGNOSIS — N1832 Chronic kidney disease, stage 3b: Secondary | ICD-10-CM | POA: Diagnosis not present

## 2021-06-21 DIAGNOSIS — I10 Essential (primary) hypertension: Secondary | ICD-10-CM | POA: Diagnosis not present

## 2021-06-21 DIAGNOSIS — Z1389 Encounter for screening for other disorder: Secondary | ICD-10-CM | POA: Diagnosis not present

## 2021-06-21 DIAGNOSIS — Z1331 Encounter for screening for depression: Secondary | ICD-10-CM | POA: Diagnosis not present

## 2021-06-21 DIAGNOSIS — Z0001 Encounter for general adult medical examination with abnormal findings: Secondary | ICD-10-CM | POA: Diagnosis not present

## 2021-06-21 DIAGNOSIS — Z23 Encounter for immunization: Secondary | ICD-10-CM | POA: Diagnosis not present

## 2021-06-22 DIAGNOSIS — N1832 Chronic kidney disease, stage 3b: Secondary | ICD-10-CM | POA: Diagnosis not present

## 2021-06-22 DIAGNOSIS — E039 Hypothyroidism, unspecified: Secondary | ICD-10-CM | POA: Diagnosis not present

## 2021-06-22 DIAGNOSIS — I10 Essential (primary) hypertension: Secondary | ICD-10-CM | POA: Diagnosis not present

## 2021-06-22 DIAGNOSIS — Z0001 Encounter for general adult medical examination with abnormal findings: Secondary | ICD-10-CM | POA: Diagnosis not present

## 2021-07-22 DIAGNOSIS — N1832 Chronic kidney disease, stage 3b: Secondary | ICD-10-CM | POA: Diagnosis not present

## 2021-07-22 DIAGNOSIS — I129 Hypertensive chronic kidney disease with stage 1 through stage 4 chronic kidney disease, or unspecified chronic kidney disease: Secondary | ICD-10-CM | POA: Diagnosis not present

## 2021-07-22 DIAGNOSIS — D508 Other iron deficiency anemias: Secondary | ICD-10-CM | POA: Diagnosis not present

## 2021-07-22 DIAGNOSIS — I10 Essential (primary) hypertension: Secondary | ICD-10-CM | POA: Diagnosis not present

## 2021-07-22 DIAGNOSIS — E211 Secondary hyperparathyroidism, not elsewhere classified: Secondary | ICD-10-CM | POA: Diagnosis not present

## 2021-07-22 DIAGNOSIS — Z5181 Encounter for therapeutic drug level monitoring: Secondary | ICD-10-CM | POA: Diagnosis not present

## 2021-07-22 DIAGNOSIS — N184 Chronic kidney disease, stage 4 (severe): Secondary | ICD-10-CM | POA: Diagnosis not present

## 2021-07-22 DIAGNOSIS — E559 Vitamin D deficiency, unspecified: Secondary | ICD-10-CM | POA: Diagnosis not present

## 2021-08-04 DIAGNOSIS — D631 Anemia in chronic kidney disease: Secondary | ICD-10-CM | POA: Diagnosis not present

## 2021-08-04 DIAGNOSIS — E559 Vitamin D deficiency, unspecified: Secondary | ICD-10-CM | POA: Diagnosis not present

## 2021-08-04 DIAGNOSIS — I129 Hypertensive chronic kidney disease with stage 1 through stage 4 chronic kidney disease, or unspecified chronic kidney disease: Secondary | ICD-10-CM | POA: Diagnosis not present

## 2021-08-04 DIAGNOSIS — E211 Secondary hyperparathyroidism, not elsewhere classified: Secondary | ICD-10-CM | POA: Diagnosis not present

## 2021-08-04 DIAGNOSIS — N184 Chronic kidney disease, stage 4 (severe): Secondary | ICD-10-CM | POA: Diagnosis not present

## 2021-08-04 DIAGNOSIS — D472 Monoclonal gammopathy: Secondary | ICD-10-CM | POA: Diagnosis not present

## 2021-08-04 DIAGNOSIS — N189 Chronic kidney disease, unspecified: Secondary | ICD-10-CM | POA: Diagnosis not present

## 2021-08-21 DIAGNOSIS — N184 Chronic kidney disease, stage 4 (severe): Secondary | ICD-10-CM | POA: Diagnosis not present

## 2021-08-21 DIAGNOSIS — I10 Essential (primary) hypertension: Secondary | ICD-10-CM | POA: Diagnosis not present

## 2021-09-13 ENCOUNTER — Encounter: Payer: Self-pay | Admitting: Internal Medicine

## 2021-09-17 IMAGING — DX DG ABDOMEN 1V
1 series · 1 of 1 positions shown · non-contrast
Comparison: 11/19/2020

CLINICAL DATA: G-tube placement

EXAM:
ABDOMEN - 1 VIEW

[abdomen supine]
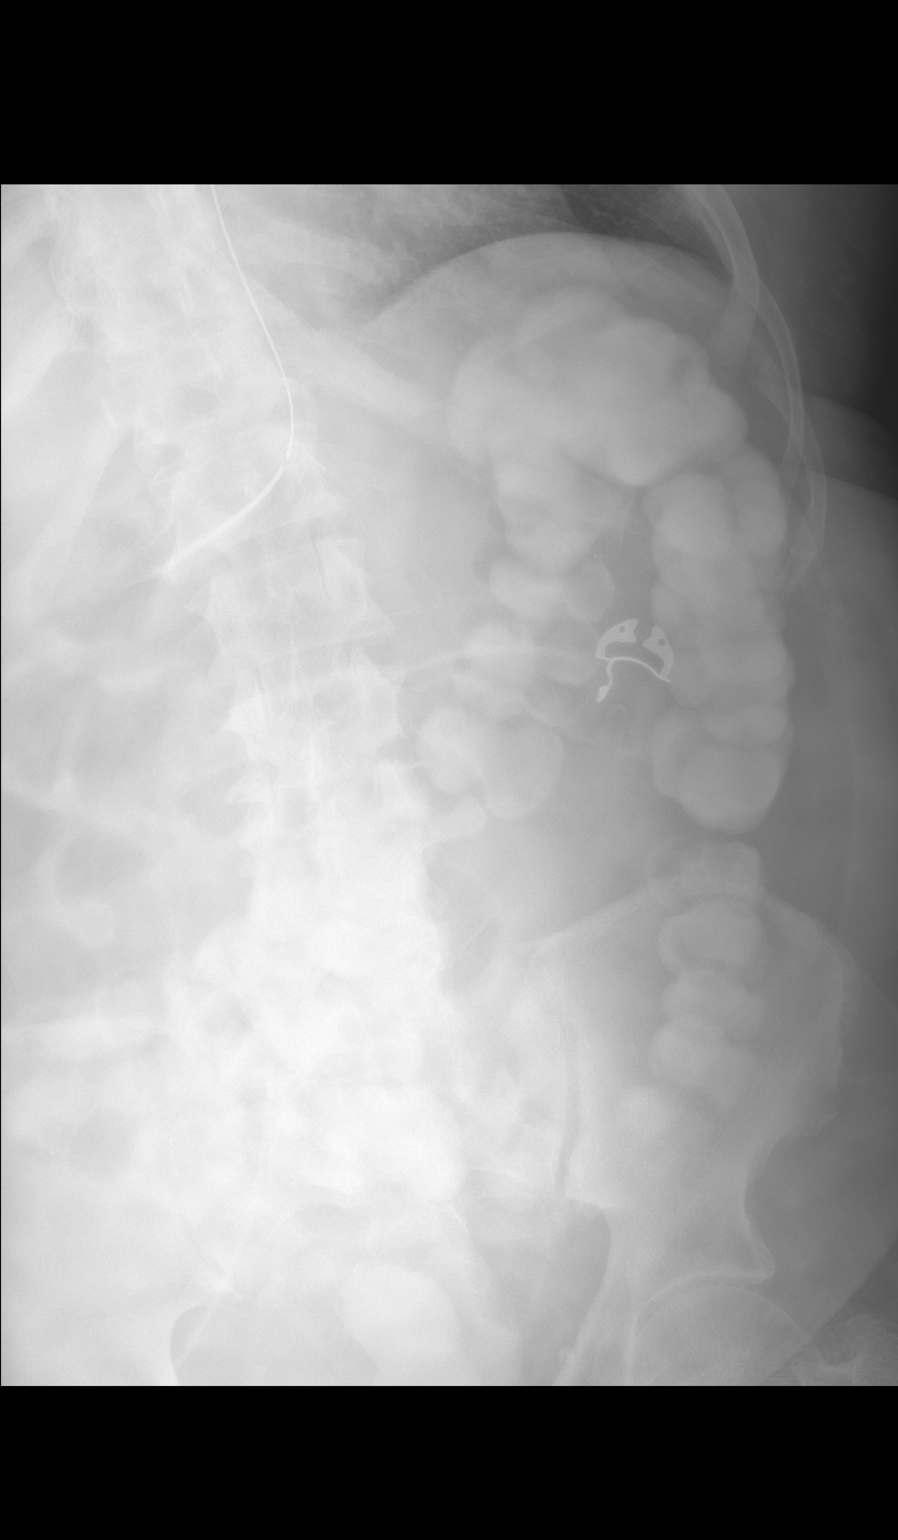

[1 of 1 positions shown; findings below may reference images not displayed]

FINDINGS: NG tube has been advanced. The tip is in the stomach with the side
port likely near the GE junction.
IMPRESSION: NG tube advanced into the stomach with the side port near the GE
junction.

## 2021-09-19 ENCOUNTER — Inpatient Hospital Stay (HOSPITAL_COMMUNITY): Payer: Medicare PPO | Attending: Hematology | Admitting: Hematology

## 2021-09-19 ENCOUNTER — Encounter (HOSPITAL_COMMUNITY): Payer: Self-pay | Admitting: Hematology

## 2021-09-19 ENCOUNTER — Other Ambulatory Visit: Payer: Self-pay

## 2021-09-19 ENCOUNTER — Ambulatory Visit (HOSPITAL_COMMUNITY)
Admission: RE | Admit: 2021-09-19 | Discharge: 2021-09-19 | Disposition: A | Discharge status: Home / Self Care | Payer: Medicare PPO | Source: Ambulatory Visit | Attending: Hematology | Admitting: Hematology

## 2021-09-19 DIAGNOSIS — M1711 Unilateral primary osteoarthritis, right knee: Secondary | ICD-10-CM | POA: Diagnosis not present

## 2021-09-19 DIAGNOSIS — D649 Anemia, unspecified: Secondary | ICD-10-CM | POA: Insufficient documentation

## 2021-09-19 DIAGNOSIS — D472 Monoclonal gammopathy: Secondary | ICD-10-CM | POA: Insufficient documentation

## 2021-09-19 DIAGNOSIS — M19011 Primary osteoarthritis, right shoulder: Secondary | ICD-10-CM | POA: Insufficient documentation

## 2021-09-19 DIAGNOSIS — M19012 Primary osteoarthritis, left shoulder: Secondary | ICD-10-CM | POA: Diagnosis not present

## 2021-09-19 LAB — COMPREHENSIVE METABOLIC PANEL
ALT: 31 U/L (ref 0–44)
AST: 25 U/L (ref 15–41)
Albumin: 3.7 g/dL (ref 3.5–5.0)
Alkaline Phosphatase: 119 U/L (ref 38–126)
Anion gap: 8 (ref 5–15)
BUN: 32 mg/dL — ABNORMAL HIGH (ref 8–23)
CO2: 27 mmol/L (ref 22–32)
Calcium: 7.9 mg/dL — ABNORMAL LOW (ref 8.9–10.3)
Chloride: 103 mmol/L (ref 98–111)
Creatinine, Ser: 2.01 mg/dL — ABNORMAL HIGH (ref 0.61–1.24)
GFR, Estimated: 32 mL/min — ABNORMAL LOW (ref >60)
Glucose, Bld: 108 mg/dL — ABNORMAL HIGH (ref 70–99)
Potassium: 4 mmol/L (ref 3.5–5.1)
Sodium: 138 mmol/L (ref 135–145)
Total Bilirubin: 0.6 mg/dL (ref 0.3–1.2)
Total Protein: 7 g/dL (ref 6.5–8.1)

## 2021-09-19 LAB — CBC WITH DIFFERENTIAL/PLATELET
Abs Immature Granulocytes: 0.02 10*3/uL (ref 0.00–0.07)
Basophils Absolute: 0 10*3/uL (ref 0.0–0.1)
Basophils Relative: 0 %
Eosinophils Absolute: 0.2 10*3/uL (ref 0.0–0.5)
Eosinophils Relative: 2 %
HCT: 34.4 % — ABNORMAL LOW (ref 39.0–52.0)
Hemoglobin: 11 g/dL — ABNORMAL LOW (ref 13.0–17.0)
Immature Granulocytes: 0 %
Lymphocytes Relative: 24 %
Lymphs Abs: 1.8 10*3/uL (ref 0.7–4.0)
MCH: 27.4 pg (ref 26.0–34.0)
MCHC: 32 g/dL (ref 30.0–36.0)
MCV: 85.6 fL (ref 80.0–100.0)
Monocytes Absolute: 0.7 10*3/uL (ref 0.1–1.0)
Monocytes Relative: 10 %
Neutro Abs: 4.7 10*3/uL (ref 1.7–7.7)
Neutrophils Relative %: 64 %
Platelets: 235 10*3/uL (ref 150–400)
RBC: 4.02 MIL/uL — ABNORMAL LOW (ref 4.22–5.81)
RDW: 16.9 % — ABNORMAL HIGH (ref 11.5–15.5)
WBC: 7.5 10*3/uL (ref 4.0–10.5)
nRBC: 0 % (ref 0.0–0.2)

## 2021-09-19 LAB — IRON AND TIBC
Iron: 43 ug/dL — ABNORMAL LOW (ref 45–182)
Saturation Ratios: 19 % (ref 17.9–39.5)
TIBC: 228 ug/dL — ABNORMAL LOW (ref 250–450)
UIBC: 185 ug/dL

## 2021-09-19 LAB — LACTATE DEHYDROGENASE: LDH: 146 U/L (ref 98–192)

## 2021-09-19 LAB — FERRITIN: Ferritin: 254 ng/mL (ref 24–336)

## 2021-09-19 LAB — VITAMIN B12: Vitamin B-12: 214 pg/mL (ref 180–914)

## 2021-09-19 LAB — FOLATE: Folate: 19.4 ng/mL (ref >5.9)

## 2021-09-19 NOTE — Progress Notes (Signed)
[facility] [address], [place] [address]   CLINIC:  Medical Oncology/Hematology  Patient Care Team: Rosita Fire, MD as PCP - General (Internal Medicine) Gala Romney Cristopher Estimable, MD as Attending Physician (Gastroenterology)  CHIEF COMPLAINTS/PURPOSE OF CONSULTATION:  Evaluation of MGUS  HISTORY OF PRESENTING ILLNESS:  Victor Hunter 88 y.o. male is here because of evaluation of MGUS, at the request of Dr. Theador Hawthorne.  Today he reports feeling good, and he is accompanied by his daughter. He denies fevers, night sweats, weight loss, new pains. He reports occasional tingling/numbness in his left hand fingers. He is not taking any calcium or vitamin D. He takes potassium. He takes 3 iron tablets daily. He denies history of MI and CVA. He denies ankle swellings. He denies history of cancer. He reports constipation for which he is currently taking Miralax.   He lives at home on his own and is able to do all of his typical home activities. He currently works full time in Land at [employer], and previously he worked as a Administrator. He quit smoking in the 1980's. His sister had breast cancer, and his brother had prostate cancer.    MEDICAL HISTORY:  Past Medical History:  Diagnosis Date   Blood transfusion without reported diagnosis    Chronic anemia    Enlarged prostate    Elevated PSA   Esophagitis    High grade squamous dysplasia occurring in the background of chronic active esophagitis,, EGD Dr. Christoper Fabian benign biopsies February 2005   GERD (gastroesophageal reflux disease)    History of BPH    s/p TURP 2008   Hypertension    Ileus ([place])    Mallory - Weiss tear    Thyroid disease    Upper GI bleed     SURGICAL HISTORY: Past Surgical History:  Procedure Laterality Date   CENTRAL VENOUS CATHETER INSERTION  04/26/2012   Procedure: INSERTION CENTRAL LINE ADULT;  Surgeon: Donato Heinz, MD;  Location: [facility];  Service: General;  Laterality: Left;  started at  0919, ended at 0930,        subclavian   CHOLECYSTECTOMY     COLONOSCOPY  10/29/03   rourk-inflammatory polyp from 40 cm removed   COLONOSCOPY  10/02/2012   Dr. Gala Romney: normal   ESOPHAGOGASTRODUODENOSCOPY  08/31/08   Rourk-possible extrinsic impression on midesophagus, noncritical Schatzki's ring, 3 mm fundal gland polyp, multiple gastric fundic polyps not manipulated, small hiatal hernia : CT chest showed mediastinal mass, eventually underwent thyroidectomy   ESOPHAGOGASTRODUODENOSCOPY  08/27/2012   Dr. Cristina Gong: moderate sized hiatal hernia, cameron lesions   LAPAROTOMY  04/26/2012   Procedure: EXPLORATORY LAPAROTOMY;  Surgeon: Donato Heinz, MD;  Location: [facility];  Service: General;  Laterality: N/A;   THYROIDECTOMY     TRANSURETHRAL RESECTION OF PROSTATE     pt denies, but this is listed in his past medical records.     SOCIAL HISTORY: Social History   Socioeconomic History   Marital status: Married    Spouse name: Not on file   Number of children: 4   Years of education: Not on file   Highest education level: Not on file  Occupational History   Occupation: retired Magazine features editor: [employer]  Tobacco Use   Smoking status: Former    Packs/day: 0.50    Types: Cigarettes    Quit date: 09/10/1992    Years since quitting: 29.0   Smokeless tobacco:  Never  Substance and Sexual Activity   Alcohol use: No   Drug use: No   Sexual activity: Never  Other Topics Concern   Not on file  Social History Narrative   Not on file   Social Determinants of Health   Financial Resource Strain: Not on file  Food Insecurity: Not on file  Transportation Needs: Not on file  Physical Activity: Not on file  Stress: Not on file  Social Connections: Not on file  Intimate Partner Violence: Not on file    FAMILY HISTORY: Family History  Problem Relation Age of Onset   Cancer Other    Heart failure Other    Liver disease Sister    Breast cancer Sister     Hypertension Mother    Stroke Father    Colon cancer Neg Hx     ALLERGIES:  has No Known Allergies.  MEDICATIONS:  Current Outpatient Medications  Medication Sig Dispense Refill   allopurinol (ZYLOPRIM) 100 MG tablet Take by mouth.     ferrous sulfate 325 (65 FE) MG EC tablet Take by mouth.     levothyroxine (SYNTHROID) 112 MCG tablet Take by mouth.     lisinopril (ZESTRIL) 20 MG tablet Take by mouth.     chlorproMAZINE (THORAZINE) 10 MG tablet Take 10 mg by mouth every 6 (six) hours as needed for hiccoughs. (Patient not taking: Reported on 09/19/2021)     diltiazem (CARDIZEM CD) 180 MG 24 hr capsule Take 180 mg by mouth daily.     finasteride (PROSCAR) 5 MG tablet Take 5 mg by mouth daily.  3   guaiFENesin-dextromethorphan (ROBITUSSIN DM) 100-10 MG/5ML syrup Take 10 mLs by mouth every 6 (six) hours. 118 mL 0   HYDROcodone-acetaminophen (NORCO/VICODIN) 5-325 MG tablet Take 1 tablet by mouth every 6 (six) hours as needed for severe pain. 15 tablet 0   pantoprazole (PROTONIX) 40 MG tablet Take 1 tablet (40 mg total) by mouth daily. 30 tablet 1   potassium chloride SA (KLOR-CON) 20 MEQ tablet Take 20 mEq by mouth daily.     tamsulosin (FLOMAX) 0.4 MG CAPS capsule Take 0.4 mg by mouth daily.     Torsemide 40 MG TABS Take 40 mg by mouth daily. 30 tablet 0   No current facility-administered medications for this visit.    REVIEW OF SYSTEMS:   Review of Systems  Constitutional:  Negative for appetite change, fatigue, fever and unexpected weight change.  Cardiovascular:  Positive for chest pain. Negative for leg swelling.  Gastrointestinal:  Positive for constipation.  Musculoskeletal:  Negative for arthralgias and myalgias.  Neurological:  Positive for numbness (L hand).  All other systems reviewed and are negative.   PHYSICAL EXAMINATION: ECOG PERFORMANCE STATUS: 0 - Asymptomatic  There were no vitals filed for this visit. There were no vitals filed for this visit. Physical  Exam Vitals reviewed.  Constitutional:      Appearance: Normal appearance.  Cardiovascular:     Rate and Rhythm: Normal rate and regular rhythm.     Pulses: Normal pulses.     Heart sounds: Normal heart sounds.  Pulmonary:     Effort: Pulmonary effort is normal.     Breath sounds: Normal breath sounds.  Musculoskeletal:     Right lower leg: No edema.     Left lower leg: No edema.  Lymphadenopathy:     Cervical: No cervical adenopathy.     Right cervical: No superficial cervical adenopathy.    Left cervical: No superficial cervical  adenopathy.     Upper Body:     Right upper body: No supraclavicular, axillary or pectoral adenopathy.     Left upper body: No supraclavicular, axillary or pectoral adenopathy.  Neurological:     General: No focal deficit present.     Mental Status: He is alert and oriented to person, place, and time.  Psychiatric:        Mood and Affect: Mood normal.        Behavior: Behavior normal.     LABORATORY DATA:  I have reviewed the data as listed No results found for this or any previous visit (from the past 2160 hour(s)).  RADIOGRAPHIC STUDIES: I have personally reviewed the radiological images as listed and agreed with the findings in the report. No results found.  ASSESSMENT:  IgG lambda MGUS: - Patient seen at the request of Dr. Theador Hawthorne - Labs on 07/22/2021 with SPEP 1.1 g of M spike.  Immunofixation with IgG lambda. - 24-hour urine for total protein on 02/28/2021-135 mg.   Social/family history: - He lives by himself at home. 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  He quit smoking in the 1980s. - Sister had breast cancer.  Brother had prostate cancer.   PLAN:  IgG lambda MGUS: - We have discussed the normal pathophysiology of monoclonal gammopathy and indications for treatment. - Recommend repeating SPEP and checking free light chains.  We will also check LDH and beta-2 microglobulin.  We  will check skeletal survey to evaluate for bone lesions. - If there are no "crab" features, will continue monitoring every 6 months.  2.  Normocytic anemia: - He is currently taking iron tablet 3 times daily.  Reports constipation. - He is taking Dulcolax every other day and MiraLAX. - Recommend cutting back on iron to once daily. - We will check ferritin and iron panel, H54 and folic acid.   All questions were answered. The patient knows to call the clinic with any problems, questions or concerns.  Derek Jack, MD 09/19/21 4:20 PM  [place] [phone]   I, Thana Ates, am acting as a scribe for Dr. Derek Jack.  I, Derek Jack MD, have reviewed the above documentation for accuracy and completeness, and I agree with the above.

## 2021-09-19 NOTE — Patient Instructions (Signed)
[place] at [facility] Discharge Instructions  You were seen and examined today by Dr. Delton Coombes. Dr. Delton Coombes is a hematologist, meaning that he specializes in the management of blood disorders. Dr. Delton Coombes discussed your past medical history, family history of cancers, and the events that led to you being here today.  You were referred to Dr. Delton Coombes due to MGUS. This is caused by cancer-type cells being present in your bone marrow, but this is a pre-cancer condition. This condition is not treated unless it becomes the cancer known as Multiple Myeloma. It requires monitoring.  Follow-up as scheduled in 3 weeks.   Thank you for choosing [place] at [facility] to provide your oncology and hematology care.  To afford each patient quality time with our provider, please arrive at least 15 minutes before your scheduled appointment time.   If you have a lab appointment with the [facility] please come in thru the Main Entrance and check in at the main information desk.  You need to re-schedule your appointment should you arrive 10 or more minutes late.  We strive to give you quality time with our providers, and arriving late affects you and other patients whose appointments are after yours.  Also, if you no show three or more times for appointments you may be dismissed from the clinic at the providers discretion.     Again, thank you for choosing [facility].  Our hope is that these requests will decrease the amount of time that you wait before being seen by our physicians.       _____________________________________________________________  Should you have questions after your visit to [facility], please contact our office at [phone] and follow the prompts.  Our office hours are 8:00 a.m. and 4:30 p.m. Monday - Friday.  Please note that voicemails left after 4:00 p.m. may not be returned until the following  business day.  We are closed weekends and major holidays.  You do have access to a nurse 24-7, just call the main number to the clinic [phone] and do not press any options, hold on the line and a nurse will answer the phone.    For prescription refill requests, have your pharmacy contact our office and allow 72 hours.    Due to Covid, you will need to wear a mask upon entering the hospital. If you do not have a mask, a mask will be given to you at the Main Entrance upon arrival. For doctor visits, patients may have 1 support person age 67 or older with them. For treatment visits, patients can not have anyone with them due to social distancing guidelines and our immunocompromised population.

## 2021-09-20 LAB — PROTEIN ELECTROPHORESIS, SERUM
A/G Ratio: 0.9 (ref 0.7–1.7)
Albumin ELP: 3.1 g/dL (ref 2.9–4.4)
Alpha-1-Globulin: 0.3 g/dL (ref 0.0–0.4)
Alpha-2-Globulin: 0.8 g/dL (ref 0.4–1.0)
Beta Globulin: 0.9 g/dL (ref 0.7–1.3)
Gamma Globulin: 1.5 g/dL (ref 0.4–1.8)
Globulin, Total: 3.5 g/dL (ref 2.2–3.9)
M-Spike, %: 0.8 g/dL — ABNORMAL HIGH
Total Protein ELP: 6.6 g/dL (ref 6.0–8.5)

## 2021-09-20 LAB — KAPPA/LAMBDA LIGHT CHAINS
Kappa free light chain: 31.8 mg/L — ABNORMAL HIGH (ref 3.3–19.4)
Kappa, lambda light chain ratio: 1.12 (ref 0.26–1.65)
Lambda free light chains: 28.3 mg/L — ABNORMAL HIGH (ref 5.7–26.3)

## 2021-09-20 LAB — BETA 2 MICROGLOBULIN, SERUM: Beta-2 Microglobulin: 3.9 mg/L — ABNORMAL HIGH (ref 0.6–2.4)

## 2021-09-21 DIAGNOSIS — I10 Essential (primary) hypertension: Secondary | ICD-10-CM | POA: Diagnosis not present

## 2021-09-21 DIAGNOSIS — N184 Chronic kidney disease, stage 4 (severe): Secondary | ICD-10-CM | POA: Diagnosis not present

## 2021-10-12 ENCOUNTER — Other Ambulatory Visit (HOSPITAL_COMMUNITY): Payer: Self-pay | Admitting: *Deleted

## 2021-10-12 ENCOUNTER — Inpatient Hospital Stay (HOSPITAL_COMMUNITY): Payer: Medicare PPO | Attending: Hematology | Admitting: Hematology

## 2021-10-12 ENCOUNTER — Other Ambulatory Visit: Payer: Self-pay

## 2021-10-12 ENCOUNTER — Encounter (HOSPITAL_COMMUNITY): Payer: Self-pay | Admitting: Hematology

## 2021-10-12 VITALS — BP 114/73 | HR 77 | Temp 98.2°F | Resp 16 | Ht 63.0 in | Wt 158.2 lb

## 2021-10-12 DIAGNOSIS — M19011 Primary osteoarthritis, right shoulder: Secondary | ICD-10-CM | POA: Insufficient documentation

## 2021-10-12 DIAGNOSIS — Z79899 Other long term (current) drug therapy: Secondary | ICD-10-CM | POA: Insufficient documentation

## 2021-10-12 DIAGNOSIS — N400 Enlarged prostate without lower urinary tract symptoms: Secondary | ICD-10-CM | POA: Diagnosis not present

## 2021-10-12 DIAGNOSIS — Z823 Family history of stroke: Secondary | ICD-10-CM | POA: Diagnosis not present

## 2021-10-12 DIAGNOSIS — D472 Monoclonal gammopathy: Secondary | ICD-10-CM

## 2021-10-12 DIAGNOSIS — Z803 Family history of malignant neoplasm of breast: Secondary | ICD-10-CM | POA: Diagnosis not present

## 2021-10-12 DIAGNOSIS — Z9079 Acquired absence of other genital organ(s): Secondary | ICD-10-CM | POA: Insufficient documentation

## 2021-10-12 DIAGNOSIS — M419 Scoliosis, unspecified: Secondary | ICD-10-CM | POA: Diagnosis not present

## 2021-10-12 DIAGNOSIS — M1711 Unilateral primary osteoarthritis, right knee: Secondary | ICD-10-CM | POA: Insufficient documentation

## 2021-10-12 DIAGNOSIS — D649 Anemia, unspecified: Secondary | ICD-10-CM | POA: Diagnosis not present

## 2021-10-12 DIAGNOSIS — R600 Localized edema: Secondary | ICD-10-CM | POA: Insufficient documentation

## 2021-10-12 DIAGNOSIS — M19012 Primary osteoarthritis, left shoulder: Secondary | ICD-10-CM | POA: Insufficient documentation

## 2021-10-12 DIAGNOSIS — Z8379 Family history of other diseases of the digestive system: Secondary | ICD-10-CM | POA: Insufficient documentation

## 2021-10-12 DIAGNOSIS — K5900 Constipation, unspecified: Secondary | ICD-10-CM | POA: Insufficient documentation

## 2021-10-12 DIAGNOSIS — Z9049 Acquired absence of other specified parts of digestive tract: Secondary | ICD-10-CM | POA: Insufficient documentation

## 2021-10-12 DIAGNOSIS — Z809 Family history of malignant neoplasm, unspecified: Secondary | ICD-10-CM | POA: Insufficient documentation

## 2021-10-12 DIAGNOSIS — M47816 Spondylosis without myelopathy or radiculopathy, lumbar region: Secondary | ICD-10-CM | POA: Insufficient documentation

## 2021-10-12 DIAGNOSIS — I10 Essential (primary) hypertension: Secondary | ICD-10-CM | POA: Insufficient documentation

## 2021-10-12 DIAGNOSIS — Z8249 Family history of ischemic heart disease and other diseases of the circulatory system: Secondary | ICD-10-CM | POA: Diagnosis not present

## 2021-10-12 DIAGNOSIS — Z87891 Personal history of nicotine dependence: Secondary | ICD-10-CM | POA: Diagnosis not present

## 2021-10-12 NOTE — Patient Instructions (Addendum)
[place] at [facility] Discharge Instructions   You were seen and examined today by Dr. Delton Coombes.  He reviewed your lab work which is normal/stable.  Continue iron tablet 1 pill daily.  Return as scheduled for lab work and office visit.    Thank you for choosing [place] at [facility] to provide your oncology and hematology care.  To afford each patient quality time with our provider, please arrive at least 15 minutes before your scheduled appointment time.   If you have a lab appointment with the [place] please come in thru the Main Entrance and check in at the main information desk.  You need to re-schedule your appointment should you arrive 10 or more minutes late.  We strive to give you quality time with our providers, and arriving late affects you and other patients whose appointments are after yours.  Also, if you no show three or more times for appointments you may be dismissed from the clinic at the providers discretion.     Again, thank you for choosing [facility].  Our hope is that these requests will decrease the amount of time that you wait before being seen by our physicians.       _____________________________________________________________  Should you have questions after your visit to [facility], please contact our office at [phone] and follow the prompts.  Our office hours are 8:00 a.m. and 4:30 p.m. Monday - Friday.  Please note that voicemails left after 4:00 p.m. may not be returned until the following business day.  We are closed weekends and major holidays.  You do have access to a nurse 24-7, just call the main number to the clinic [phone] and do not press any options, hold on the line and a nurse will answer the phone.    For prescription refill requests, have your pharmacy contact our office and allow 72 hours.    Due to Covid, you will need to wear a mask upon  entering the hospital. If you do not have a mask, a mask will be given to you at the Main Entrance upon arrival. For doctor visits, patients may have 1 support person age 30 or older with them. For treatment visits, patients can not have anyone with them due to social distancing guidelines and our immunocompromised population.

## 2021-10-12 NOTE — Progress Notes (Signed)
[place], [place] [address]   CLINIC:  Medical Oncology/Hematology  PCP:  Victor Meiers, MD [place] / [place] [id number]  [phone]  REASON FOR VISIT:  Follow-up for MGUS  PRIOR THERAPY: none  CURRENT THERAPY: Iron tablet once daily  INTERVAL HISTORY:  [name], a 88 y.o. male, returns for routine follow-up for his MGUS. Victor Hunter was last seen on 09/19/2021.  Today he reports feeling good. He take iron tablets once daily. His BM have improved.   REVIEW OF SYSTEMS:  Review of Systems  Constitutional:  Negative for appetite change and fatigue.  Gastrointestinal:  Positive for constipation (improved).  All other systems reviewed and are negative.  PAST MEDICAL/SURGICAL HISTORY:  Past Medical History:  Diagnosis Date   Blood transfusion without reported diagnosis    Chronic anemia    Enlarged prostate    Elevated PSA   Esophagitis    High grade squamous dysplasia occurring in the background of chronic active esophagitis,, EGD Dr. Christoper Fabian benign biopsies February 2005   GERD (gastroesophageal reflux disease)    History of BPH    s/p TURP 2008   Hypertension    Ileus ([place])    Mallory - Weiss tear    Thyroid disease    Upper GI bleed    Past Surgical History:  Procedure Laterality Date   CENTRAL VENOUS CATHETER INSERTION  04/26/2012   Procedure: INSERTION CENTRAL LINE ADULT;  Surgeon: Donato Heinz, MD;  Location: [facility];  Service: General;  Laterality: Left;  started at 0919, ended at 0930,        subclavian   CHOLECYSTECTOMY     COLONOSCOPY  10/29/03   rourk-inflammatory polyp from 40 cm removed   COLONOSCOPY  10/02/2012   Dr. Gala Romney: normal   ESOPHAGOGASTRODUODENOSCOPY  08/31/08   Rourk-possible extrinsic impression on midesophagus, noncritical Schatzki's ring, 3 mm fundal gland polyp, multiple gastric fundic polyps not manipulated, small hiatal hernia : CT chest showed mediastinal mass,  eventually underwent thyroidectomy   ESOPHAGOGASTRODUODENOSCOPY  08/27/2012   Dr. Cristina Gong: moderate sized hiatal hernia, cameron lesions   LAPAROTOMY  04/26/2012   Procedure: EXPLORATORY LAPAROTOMY;  Surgeon: Donato Heinz, MD;  Location: [facility];  Service: General;  Laterality: N/A;   THYROIDECTOMY     TRANSURETHRAL RESECTION OF PROSTATE     pt denies, but this is listed in his past medical records.     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[place]: [employer]  Tobacco Use   Smoking status: Former    Packs/day: 0.50    Types: Cigarettes    Quit date: 09/10/1992    Years since quitting: 29.1   Smokeless tobacco: Never  Substance and Sexual Activity   Alcohol use: No   Drug use: No   Sexual activity: Never  Other Topics Concern   Not on file  Social History Narrative   Not on file   Social Determinants of Health   Financial Resource Strain: Not on file  Food Insecurity: Not on file  Transportation Needs: Not on file  Physical Activity: Not on file  Stress: Not on file  Social Connections: Not on file  Intimate Partner Violence: Not on file    FAMILY HISTORY:  Family History  Problem Relation Age of Onset   Cancer Other    Heart failure Other    Liver disease Sister    Breast cancer Sister    Hypertension Mother    Stroke Father    Colon cancer Neg Hx     CURRENT MEDICATIONS:  Current Outpatient Medications  Medication Sig Dispense Refill   allopurinol (ZYLOPRIM) 100 MG tablet Take by mouth.     diltiazem (CARDIZEM CD) 180 MG 24 hr capsule Take 180 mg by mouth daily.     ferrous sulfate 325 (65 FE) MG EC tablet Take by mouth.     finasteride (PROSCAR) 5 MG tablet Take 5 mg by mouth daily.  3    guaiFENesin-dextromethorphan (ROBITUSSIN DM) 100-10 MG/5ML syrup Take 10 mLs by mouth every 6 (six) hours. 118 mL 0   HYDROcodone-acetaminophen (NORCO/VICODIN) 5-325 MG tablet Take 1 tablet by mouth every 6 (six) hours as needed for severe pain. 15 tablet 0   levothyroxine (SYNTHROID) 112 MCG tablet Take by mouth.     lisinopril (ZESTRIL) 20 MG tablet Take by mouth.     pantoprazole (PROTONIX) 40 MG tablet Take 1 tablet (40 mg total) by mouth daily. 30 tablet 1   potassium chloride SA (KLOR-CON) 20 MEQ tablet Take 20 mEq by mouth daily.     tamsulosin (FLOMAX) 0.4 MG CAPS capsule Take 0.4 mg by mouth daily.     Torsemide 40 MG TABS Take 40 mg by mouth daily. 30 tablet 0   No current facility-administered medications for this visit.    ALLERGIES:  No Known Allergies  PHYSICAL EXAM:  Performance status (ECOG): 1 - Symptomatic but completely ambulatory  Vitals:   10/12/21 1516  BP: 114/73  Pulse: 77  Resp: 16  Temp: 98.2 F (36.8 C)  SpO2: 100%   Wt Readings from Last 3 Encounters:  10/12/21 158 lb 3.2 oz (71.8 kg)  01/04/21 165 lb 3.2 oz (74.9 kg)  11/28/20 172 lb 8.2 oz (78.3 kg)   Physical Exam Vitals reviewed.  Constitutional:      Appearance: Normal appearance.  Cardiovascular:     Rate and Rhythm: Normal rate and regular rhythm.     Pulses: Normal pulses.     Heart sounds: Normal heart sounds.  Pulmonary:     Effort: Pulmonary effort is normal.     Breath sounds: Normal breath sounds.  Neurological:     General: No focal deficit present.     Mental Status: He is alert and oriented to person, place, and time.  Psychiatric:        Mood and Affect: Mood normal.        Behavior: Behavior normal.    LABORATORY DATA:  I have reviewed the labs as listed.  CBC Latest Ref Rng & Units 09/19/2021 12/04/2020 12/03/2020  WBC 4.0 - 10.5 K/uL 7.5 8.2 8.0  Hemoglobin 13.0 - 17.0 g/dL 11.0(L) 8.8(L) 8.6(L)  Hematocrit 39.0 - 52.0 % 34.4(L) 27.8(L) 26.8(L)  Platelets 150 - 400  K/uL 235 346 306   CMP Latest Ref Rng & Units 09/19/2021 12/05/2020 12/04/2020  Glucose 70 - 99 mg/dL 108(H) 135(H) 161(H)  BUN 8 - 23 mg/dL 32(H) 14 13  Creatinine 0.61 - 1.24 mg/dL 2.01(H) 1.77(H) 1.81(H)  Sodium 135 - 145 mmol/L 138 136 136  Potassium 3.5 - 5.1 mmol/L 4.0 3.6 3.3(L)  Chloride 98 - 111  mmol/L 103 97(L) 98  CO2 22 - 32 mmol/L 27 30 29   Calcium 8.9 - 10.3 mg/dL 7.9(L) 7.3(L) 7.2(L)  Total Protein 6.5 - 8.1 g/dL 7.0 - -  Total Bilirubin 0.3 - 1.2 mg/dL 0.6 - -  Alkaline Phos 38 - 126 U/L 119 - -  AST 15 - 41 U/L 25 - -  ALT 0 - 44 U/L 31 - -      Component Value Date/Time   RBC 4.02 (L) 09/19/2021 1605   MCV 85.6 09/19/2021 1605   MCH 27.4 09/19/2021 1605   MCHC 32.0 09/19/2021 1605   RDW 16.9 (H) 09/19/2021 1605   LYMPHSABS 1.8 09/19/2021 1605   MONOABS 0.7 09/19/2021 1605   EOSABS 0.2 09/19/2021 1605   BASOSABS 0.0 09/19/2021 1605    DIAGNOSTIC IMAGING:  I have independently reviewed the scans and discussed with the patient. DG Bone Survey Met  Result Date: 09/19/2021 CLINICAL DATA:  MGUS, history of right femur fracture EXAM: METASTATIC BONE SURVEY COMPARISON:  12/01/2020 FINDINGS: Standard skeletal survey was performed. Lateral skull: Nonspecific small rounded lucencies are seen within the frontal bone, measuring to 3 mm in size. No other acute or destructive bony lesions. Upper extremities: Frontal views of the upper extremities are obtained from the shoulders through the wrists. The bones are osteopenic. There are no acute or destructive bony lesions. Moderate bilateral shoulder osteoarthritis. Mild bilateral elbow osteoarthritis. Soft tissues are unremarkable. Spine: Frontal and lateral views of the cervical, thoracic, lumbar spine are obtained. Marked right convex lower thoracic scoliosis. Mild compensatory left convex lumbar curvature. Otherwise alignment is anatomic. There are no acute or destructive bony lesions. Diffuse multilevel cervical, thoracic, and  lumbar spondylosis and facet hypertrophy is identified. Chest: Single frontal view demonstrates an unremarkable cardiac silhouette. No airspace disease, effusion, or pneumothorax. There are no acute or destructive bony lesions. Postsurgical changes from thyroidectomy. Pelvis: No acute or destructive bony lesions. Prominent lower lumbar spondylosis. Lower extremities: Frontal views of the lower extremities are obtained from the hips through the ankles. There are no acute or destructive bony lesions. Prior healed right femoral and right fibular fractures. Moderate osteoarthritis lateral compartment right knee. Mild diffuse lower extremity edema, left greater than right. IMPRESSION: 1. Nonspecific punctate lucencies within the frontal region of the calvarium. 2. No other acute or destructive bony lesions. 3. Multifocal osteoarthritis greatest in the bilateral shoulders and right knee. 4. Diffuse spondylosis throughout the spine, with S shaped scoliosis of the thoracolumbar spine as above. Electronically Signed   By: Randa Ngo M.D.   On: 09/19/2021 21:34     ASSESSMENT:  IgG lambda MGUS: - Patient seen at the request of Dr. Theador Hawthorne - Labs on 07/22/2021 with SPEP 1.1 g of M spike.  Immunofixation with IgG lambda. - 24-hour urine for total protein on 02/28/2021-135 mg.    Social/family history: - He lives by himself at home. 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  He quit smoking in the 1980s. - Sister had breast cancer.  Brother had prostate cancer.   PLAN:  IgG lambda MGUS: - We have reviewed labs from 09/19/2021.  M spike is 0.8.  Kappa light chains are 31.8, lambda light chains 28 and ratio 1.12.  Beta-2 microglobulin 3.9. - We reviewed the skeletal survey from 09/19/2021.  Nonspecific punctate lucencies within the frontal region of the calvarium.  No other acute lesions. - He does not have any "crab" features.  Calcium is 7.9 and creatinine 2.01. -  I think this skull lesions are nonspecific. - I have recommended follow-up in 6 months with repeat myeloma panel and routine labs.  2.  Normocytic anemia: - His ferritin is 254, percent saturation is 19.  I14 and folic acid was normal.  Hemoglobin is 11. - I have recommended continuing iron tablet daily.  If he cannot tolerate, he may take it every other day.  We will plan to repeat ferritin and iron panel at next visit.  Orders placed this encounter:  No orders of the defined types were placed in this encounter.    Derek Jack, MD [place] [phone]   I, Thana Ates, am acting as a scribe for [place].  I, Derek Jack MD, have reviewed the above documentation for accuracy and completeness, and I agree with the above.

## 2021-10-13 DIAGNOSIS — E559 Vitamin D deficiency, unspecified: Secondary | ICD-10-CM | POA: Diagnosis not present

## 2021-10-13 DIAGNOSIS — D472 Monoclonal gammopathy: Secondary | ICD-10-CM | POA: Diagnosis not present

## 2021-10-13 DIAGNOSIS — E211 Secondary hyperparathyroidism, not elsewhere classified: Secondary | ICD-10-CM | POA: Diagnosis not present

## 2021-10-13 DIAGNOSIS — I129 Hypertensive chronic kidney disease with stage 1 through stage 4 chronic kidney disease, or unspecified chronic kidney disease: Secondary | ICD-10-CM | POA: Diagnosis not present

## 2021-10-13 DIAGNOSIS — D631 Anemia in chronic kidney disease: Secondary | ICD-10-CM | POA: Diagnosis not present

## 2021-10-13 DIAGNOSIS — N184 Chronic kidney disease, stage 4 (severe): Secondary | ICD-10-CM | POA: Diagnosis not present

## 2021-10-13 DIAGNOSIS — N189 Chronic kidney disease, unspecified: Secondary | ICD-10-CM | POA: Diagnosis not present

## 2021-10-17 DIAGNOSIS — N189 Chronic kidney disease, unspecified: Secondary | ICD-10-CM | POA: Diagnosis not present

## 2021-10-17 DIAGNOSIS — I129 Hypertensive chronic kidney disease with stage 1 through stage 4 chronic kidney disease, or unspecified chronic kidney disease: Secondary | ICD-10-CM | POA: Diagnosis not present

## 2021-10-17 DIAGNOSIS — E211 Secondary hyperparathyroidism, not elsewhere classified: Secondary | ICD-10-CM | POA: Diagnosis not present

## 2021-10-17 DIAGNOSIS — D472 Monoclonal gammopathy: Secondary | ICD-10-CM | POA: Diagnosis not present

## 2021-10-17 DIAGNOSIS — D631 Anemia in chronic kidney disease: Secondary | ICD-10-CM | POA: Diagnosis not present

## 2021-10-17 DIAGNOSIS — N184 Chronic kidney disease, stage 4 (severe): Secondary | ICD-10-CM | POA: Diagnosis not present

## 2021-10-18 ENCOUNTER — Ambulatory Visit: Payer: Medicare PPO | Admitting: Gastroenterology

## 2021-10-22 DIAGNOSIS — I10 Essential (primary) hypertension: Secondary | ICD-10-CM | POA: Diagnosis not present

## 2021-10-22 DIAGNOSIS — N184 Chronic kidney disease, stage 4 (severe): Secondary | ICD-10-CM | POA: Diagnosis not present

## 2021-11-19 DIAGNOSIS — N184 Chronic kidney disease, stage 4 (severe): Secondary | ICD-10-CM | POA: Diagnosis not present

## 2021-11-19 DIAGNOSIS — I10 Essential (primary) hypertension: Secondary | ICD-10-CM | POA: Diagnosis not present

## 2021-12-12 DIAGNOSIS — Z1389 Encounter for screening for other disorder: Secondary | ICD-10-CM | POA: Diagnosis not present

## 2021-12-12 DIAGNOSIS — Z0001 Encounter for general adult medical examination with abnormal findings: Secondary | ICD-10-CM | POA: Diagnosis not present

## 2021-12-12 DIAGNOSIS — N184 Chronic kidney disease, stage 4 (severe): Secondary | ICD-10-CM | POA: Diagnosis not present

## 2021-12-12 DIAGNOSIS — Z1331 Encounter for screening for depression: Secondary | ICD-10-CM | POA: Diagnosis not present

## 2021-12-12 DIAGNOSIS — I10 Essential (primary) hypertension: Secondary | ICD-10-CM | POA: Diagnosis not present

## 2021-12-12 DIAGNOSIS — N401 Enlarged prostate with lower urinary tract symptoms: Secondary | ICD-10-CM | POA: Diagnosis not present

## 2021-12-16 DIAGNOSIS — D472 Monoclonal gammopathy: Secondary | ICD-10-CM | POA: Diagnosis not present

## 2021-12-16 DIAGNOSIS — I129 Hypertensive chronic kidney disease with stage 1 through stage 4 chronic kidney disease, or unspecified chronic kidney disease: Secondary | ICD-10-CM | POA: Diagnosis not present

## 2021-12-16 DIAGNOSIS — Z0001 Encounter for general adult medical examination with abnormal findings: Secondary | ICD-10-CM | POA: Diagnosis not present

## 2021-12-16 DIAGNOSIS — N189 Chronic kidney disease, unspecified: Secondary | ICD-10-CM | POA: Diagnosis not present

## 2021-12-16 DIAGNOSIS — N184 Chronic kidney disease, stage 4 (severe): Secondary | ICD-10-CM | POA: Diagnosis not present

## 2021-12-16 DIAGNOSIS — I10 Essential (primary) hypertension: Secondary | ICD-10-CM | POA: Diagnosis not present

## 2021-12-16 DIAGNOSIS — E211 Secondary hyperparathyroidism, not elsewhere classified: Secondary | ICD-10-CM | POA: Diagnosis not present

## 2021-12-16 DIAGNOSIS — D631 Anemia in chronic kidney disease: Secondary | ICD-10-CM | POA: Diagnosis not present

## 2021-12-22 DIAGNOSIS — I129 Hypertensive chronic kidney disease with stage 1 through stage 4 chronic kidney disease, or unspecified chronic kidney disease: Secondary | ICD-10-CM | POA: Diagnosis not present

## 2021-12-22 DIAGNOSIS — D472 Monoclonal gammopathy: Secondary | ICD-10-CM | POA: Diagnosis not present

## 2021-12-22 DIAGNOSIS — Z5181 Encounter for therapeutic drug level monitoring: Secondary | ICD-10-CM | POA: Diagnosis not present

## 2021-12-22 DIAGNOSIS — E559 Vitamin D deficiency, unspecified: Secondary | ICD-10-CM | POA: Diagnosis not present

## 2021-12-22 DIAGNOSIS — D631 Anemia in chronic kidney disease: Secondary | ICD-10-CM | POA: Diagnosis not present

## 2021-12-22 DIAGNOSIS — E211 Secondary hyperparathyroidism, not elsewhere classified: Secondary | ICD-10-CM | POA: Diagnosis not present

## 2021-12-22 DIAGNOSIS — N1832 Chronic kidney disease, stage 3b: Secondary | ICD-10-CM | POA: Diagnosis not present

## 2021-12-22 DIAGNOSIS — N189 Chronic kidney disease, unspecified: Secondary | ICD-10-CM | POA: Diagnosis not present

## 2022-01-03 ENCOUNTER — Encounter: Payer: Self-pay | Admitting: Gastroenterology

## 2022-01-03 ENCOUNTER — Ambulatory Visit: Payer: Medicare PPO | Admitting: Gastroenterology

## 2022-01-03 DIAGNOSIS — D649 Anemia, unspecified: Secondary | ICD-10-CM | POA: Diagnosis not present

## 2022-01-03 NOTE — Progress Notes (Signed)
Gastroenterology Office Note    Referring Provider: Dr. Theador Hawthorne Primary Care Physician:  Carrolyn Meiers, MD  Primary GI: Dr. Gala Romney     Chief Complaint   Chief Complaint  Patient presents with   New Patient (Initial Visit)    Low iron referral     History of Present Illness   Victor Hunter is an 88 y.o. male presenting today at the request of Dr. Theador Hawthorne due to [place].   History significant for possible small bowel obstruction in Aug 2013 s/p exploratory laparotomy. EGD in 2013 by Dr. Cristina Gong due to coffee-ground emesis and transfusion-dependent anemia noted Cameron lesions, no active bleeding. He then had a colonoscopy in early 2014 that was normal   History of anemia multifactorial in setting of chronic disease and component of IDA. Iron sat low, ferritin normal. Nephrology has referred. Hematology following for IgG lambda MGUS.    Pill dysphagia at times to one large pill, otherwise no issues. No solid food dysphagia. No melena or hematochezia. No weight loss or lack of appetite.   Ferrous sulfate 325 mg cut from TID to once daily due to constipation.   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   In April 2023: Hgb 11, Iron 54, ferritin 322.   He does not want to pursue procedures at this time.    Past Medical History:  Diagnosis Date   Blood transfusion without reported diagnosis    Chronic anemia    Enlarged prostate    Elevated PSA   Esophagitis    High grade squamous dysplasia occurring in the background of chronic active esophagitis,, EGD Dr. Christoper Fabian benign biopsies February 2005   GERD (gastroesophageal reflux disease)    History of BPH    s/p TURP 2008   Hypertension    Ileus ([place])    Mallory - Weiss tear    Thyroid disease    Upper GI bleed     Past Surgical History:  Procedure Laterality Date   CENTRAL VENOUS CATHETER INSERTION  04/26/2012   Procedure: INSERTION CENTRAL LINE ADULT;  Surgeon: Donato Heinz, MD;   Location: [facility];  Service: General;  Laterality: Left;  started at 0919, ended at 0930,        subclavian   CHOLECYSTECTOMY     COLONOSCOPY  10/29/03   rourk-inflammatory polyp from 40 cm removed   COLONOSCOPY  10/02/2012   Dr. Gala Romney: normal   ESOPHAGOGASTRODUODENOSCOPY  08/31/08   Rourk-possible extrinsic impression on midesophagus, noncritical Schatzki's ring, 3 mm fundal gland polyp, multiple gastric fundic polyps not manipulated, small hiatal hernia : CT chest showed mediastinal mass, eventually underwent thyroidectomy   ESOPHAGOGASTRODUODENOSCOPY  08/27/2012   Dr. Cristina Gong: moderate sized hiatal hernia, cameron lesions   LAPAROTOMY  04/26/2012   Procedure: EXPLORATORY LAPAROTOMY;  Surgeon: Donato Heinz, MD;  Location: [facility];  Service: General;  Laterality: N/A;   THYROIDECTOMY     TRANSURETHRAL RESECTION OF PROSTATE     pt denies, but this is listed in his past medical records.     Current Outpatient Medications  Medication Sig Dispense Refill   allopurinol (ZYLOPRIM) 100 MG tablet Take by mouth.     calcitRIOL (ROCALTROL) 0.25 MCG capsule Take 0.25 mcg by mouth daily.     diltiazem (CARDIZEM CD) 180 MG 24 hr capsule Take 180 mg by mouth daily.     ferrous sulfate 325 (65 FE) MG EC tablet Take by mouth.  finasteride (PROSCAR) 5 MG tablet Take 5 mg by mouth daily.  3   HYDROcodone-acetaminophen (NORCO/VICODIN) 5-325 MG tablet Take 1 tablet by mouth every 6 (six) hours as needed for severe pain. 15 tablet 0   levothyroxine (SYNTHROID) 112 MCG tablet Take by mouth.     lisinopril (ZESTRIL) 20 MG tablet Take by mouth.     pantoprazole (PROTONIX) 40 MG tablet Take 1 tablet (40 mg total) by mouth daily. 30 tablet 1   potassium chloride SA (KLOR-CON) 20 MEQ tablet Take 20 mEq by mouth daily.     tamsulosin (FLOMAX) 0.4 MG CAPS capsule Take 0.4 mg by mouth daily.     Torsemide 40 MG TABS Take 40 mg by mouth daily. 30 tablet 0   guaiFENesin-dextromethorphan (ROBITUSSIN DM) 100-10  MG/5ML syrup Take 10 mLs by mouth every 6 (six) hours. (Patient not taking: Reported on 01/03/2022) 118 mL 0   No current facility-administered medications for this visit.    Allergies as of 01/03/2022   (No Known Allergies)    Family History  Problem Relation Age of Onset   Cancer Other    Heart failure Other    Liver disease Sister    Breast cancer Sister    Hypertension Mother    Stroke Father    Colon cancer Neg Hx     Social History   Socioeconomic History   Marital status: Married    Spouse name: Not on file   Number of children: 4   Years of education: Not on file   Highest education level: Not on file  Occupational History   Occupation: retired Magazine features editor: [place]  Tobacco Use   Smoking status: Former    Packs/day: 0.50    Types: Cigarettes    Quit date: 09/10/1992    Years since quitting: 29.3   Smokeless tobacco: Never  Substance and Sexual Activity   Alcohol use: No   Drug use: No   Sexual activity: Never  Other Topics Concern   Not on file  Social History Narrative   Not on file   Social Determinants of Health   Financial Resource Strain: Not on file  Food Insecurity: Not on file  Transportation Needs: Not on file  Physical Activity: Not on file  Stress: Not on file  Social Connections: Not on file  Intimate Partner Violence: Not on file     Review of Systems   Gen: Denies any fever, chills, fatigue, weight loss, lack of appetite.  CV: Denies chest pain, heart palpitations, peripheral edema, syncope.  Resp: Denies shortness of breath at rest or with exertion. Denies wheezing or cough.  GI: see HPI GU : Denies urinary burning, urinary frequency, urinary hesitancy MS: Denies joint pain, muscle weakness, cramps, or limitation of movement.  Derm: Denies rash, itching, dry skin Psych: Denies depression, anxiety, memory loss, and confusion Heme: Denies bruising, bleeding, and enlarged lymph  nodes.   Physical Exam   BP 126/62   Pulse 70   Temp 98.4 F (36.9 C)   Ht '5\' 2"'$  (1.575 m)   Wt 160 lb 3.2 oz (72.7 kg)   BMI 29.30 kg/m  General:   Alert and oriented. Pleasant and cooperative. Well-nourished and well-developed.  Head:  Normocephalic and atraumatic. Eyes:  Without icterus Ears:  Normal auditory acuity. Lungs:  Clear to auscultation bilaterally.  Heart:  S1, S2 present without murmurs appreciated.  Abdomen:  +BS, soft, non-tender and non-distended. No  HSM noted. No guarding or rebound. No masses appreciated.  Rectal:  Deferred  Msk:  Symmetrical without gross deformities. Normal posture. Extremities:  Without edema. Neurologic:  Alert and  oriented x4;  grossly normal neurologically. Skin:  Intact without significant lesions or rashes. Psych:  Alert and cooperative. Normal mood and affect.   Assessment   Victor Hunter is an 88 y.o. male presenting today  at the request of Dr. Theador Hawthorne due to [place]. Last EGD in 2013 with Lysbeth Galas lesions and colonoscopy in 2014 normal.  He has no overt GI bleeding or concerning sign/symptoms. We discussed colonoscopy/EGD due to anemia with IDA component. He desires to avoid any endoscopic procedures at this time. 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 He is of sound mind and able to make this decision. His daughter was present with him today. We discussed signs/symptoms to monitor and report that would require diagnostic evaluation.     PLAN   Continue follow-up with Nephrology and Hematology  Return prn, as patient is declining endoscopic evaluation  Annitta Needs, PhD, ANP-BC [facility]

## 2022-01-03 NOTE — Patient Instructions (Signed)
Monitor for any abdominal pain, nausea, vomiting, black/sticky stools, bright red blood in stool or per rectum, problems swallowing food, changes in bowel habits, loss of appetite, or weight loss. Please call if any of these occur. ? ?Let me know if you change your mind on procedures! ? ?We will see you back as needed! ? ?It was a pleasure to see you today. I want to create trusting relationships with patients to provide genuine, compassionate, and quality care. I value your feedback. If you receive a survey regarding your visit,  I greatly appreciate you taking time to fill this out.  ? ?Annitta Needs, PhD, ANP-BC ?[facility]  ? ?

## 2022-01-09 NOTE — Progress Notes (Incomplete)
H&P ? ?Chief Complaint: *** ? ?History of Present Illness: *** ? ?Past Medical History:  ?Diagnosis Date  ? Blood transfusion without reported diagnosis   ? Chronic anemia   ? Enlarged prostate   ? Elevated PSA  ? Esophagitis   ? High grade squamous dysplasia occurring in the background of chronic active esophagitis,, EGD Dr. Christoper Fabian benign biopsies February 2005  ? GERD (gastroesophageal reflux disease)   ? History of BPH   ? s/p TURP 2008  ? Hypertension   ? Ileus ([place])   ? Mallory - Weiss tear   ? Thyroid disease   ? Upper GI bleed   ? ? ?Past Surgical History:  ?Procedure Laterality Date  ? CENTRAL VENOUS CATHETER INSERTION  04/26/2012  ? Procedure: INSERTION CENTRAL LINE ADULT;  Surgeon: Donato Heinz, MD;  Location: [facility];  Service: General;  Laterality: Left;  started at 0919, ended at 0930,        subclavian  ? CHOLECYSTECTOMY    ? COLONOSCOPY  10/29/03  ? rourk-inflammatory polyp from 40 cm removed  ? COLONOSCOPY  10/02/2012  ? Dr. Gala Romney: normal  ? ESOPHAGOGASTRODUODENOSCOPY  08/31/08  ? Rourk-possible extrinsic impression on midesophagus, noncritical Schatzki's ring, 3 mm fundal gland polyp, multiple gastric fundic polyps not manipulated, small hiatal hernia : CT chest showed mediastinal mass, eventually underwent thyroidectomy  ? ESOPHAGOGASTRODUODENOSCOPY  08/27/2012  ? Dr. Cristina Gong: moderate sized hiatal hernia, cameron lesions  ? LAPAROTOMY  04/26/2012  ? Procedure: EXPLORATORY LAPAROTOMY;  Surgeon: Donato Heinz, MD;  Location: [facility];  Service: General;  Laterality: N/A;  ? THYROIDECTOMY    ? TRANSURETHRAL RESECTION OF PROSTATE    ? pt denies, but this is listed in his past medical records.   ? ? ?Home Medications:  ?Allergies as of 01/10/2022   ?No Known Allergies ?  ? ?  ?Medication List  ?  ? ?  ? Accurate as of Jan 09, 2022 11:11 AM. If you have any questions, ask your nurse or doctor.  ?  ?  ? ?  ? ?allopurinol 100 MG tablet ?Commonly known as: ZYLOPRIM ?Take by mouth. ?  ?calcitRIOL 0.25 MCG  capsule ?Commonly known as: ROCALTROL ?Take 0.25 mcg by mouth daily. ?  ?diltiazem 180 MG 24 hr capsule ?Commonly known as: CARDIZEM CD ?Take 180 mg by mouth daily. ?  ?ferrous sulfate 325 (65 FE) MG EC tablet ?Take by mouth. ?  ?finasteride 5 MG tablet ?Commonly known as: PROSCAR ?Take 5 mg by mouth daily. ?  ?guaiFENesin-dextromethorphan 100-10 MG/5ML syrup ?Commonly known as: ROBITUSSIN DM ?Take 10 mLs by mouth every 6 (six) hours. ?  ?HYDROcodone-acetaminophen 5-325 MG tablet ?Commonly known as: NORCO/VICODIN ?Take 1 tablet by mouth every 6 (six) hours as needed for severe pain. ?  ?levothyroxine 112 MCG tablet ?Commonly known as: SYNTHROID ?Take by mouth. ?  ?lisinopril 20 MG tablet ?Commonly known as: ZESTRIL ?Take by mouth. ?  ?pantoprazole 40 MG tablet ?Commonly known as: PROTONIX ?Take 1 tablet (40 mg total) by mouth daily. ?  ?potassium chloride SA 20 MEQ tablet ?Commonly known as: KLOR-CON M ?Take 20 mEq by mouth daily. ?  ?tamsulosin 0.4 MG Caps capsule ?Commonly known as: FLOMAX ?Take 0.4 mg by mouth daily. ?  ?Torsemide 40 MG Tabs ?Take 40 mg by mouth daily. ?  ? ?  ? ? ?Allergies: No Known Allergies ? ?Family History  ?Problem Relation Age of Onset  ? Cancer Other   ? Heart failure Other   ?  Liver disease Sister   ? Breast cancer Sister   ? Hypertension Mother   ? Stroke Father   ? Colon cancer Neg Hx   ? ? ?Social History:  reports that he quit smoking about 29 years ago. His smoking use included cigarettes. He smoked an average of .5 packs per day. He has never used smokeless tobacco. He reports that he does not drink alcohol and does not use drugs. ? ?ROS: ?A complete review of systems was performed.  All systems are negative except for pertinent findings as noted. ? ?Physical Exam:  ?Vital signs in last 24 hours: ?There were no vitals taken for this visit. ?Constitutional:  Alert and oriented, No acute distress ?Cardiovascular: Regular rate  ?Respiratory: Normal respiratory effort ?GI: Abdomen is  soft, nontender, nondistended, no abdominal masses. No CVAT.  ?Genitourinary: Normal male phallus, testes are descended bilaterally and non-tender and without masses, scrotum is normal in appearance without lesions or masses, perineum is normal on inspection. ?Lymphatic: No lymphadenopathy ?Neurologic: Grossly intact, no focal deficits ?Psychiatric: Normal mood and affect ? ?I have reviewed prior pt notes ? ?I have reviewed notes from referring/previous physicians ? ?I have reviewed urinalysis results ? ?I have independently reviewed prior imaging ? ?I have reviewed prior PSA results ? ?I have reviewed prior urine culture ? ? ?Impression/Assessment:  ?*** ? ?Plan:  ?*** ? ?

## 2022-01-10 ENCOUNTER — Ambulatory Visit: Payer: Medicare PPO | Admitting: Urology

## 2022-01-10 DIAGNOSIS — N401 Enlarged prostate with lower urinary tract symptoms: Secondary | ICD-10-CM

## 2022-01-11 DIAGNOSIS — N1832 Chronic kidney disease, stage 3b: Secondary | ICD-10-CM | POA: Diagnosis not present

## 2022-01-11 DIAGNOSIS — I10 Essential (primary) hypertension: Secondary | ICD-10-CM | POA: Diagnosis not present

## 2022-02-11 DIAGNOSIS — E039 Hypothyroidism, unspecified: Secondary | ICD-10-CM | POA: Diagnosis not present

## 2022-02-11 DIAGNOSIS — I10 Essential (primary) hypertension: Secondary | ICD-10-CM | POA: Diagnosis not present

## 2022-02-16 DIAGNOSIS — I129 Hypertensive chronic kidney disease with stage 1 through stage 4 chronic kidney disease, or unspecified chronic kidney disease: Secondary | ICD-10-CM | POA: Diagnosis not present

## 2022-02-16 DIAGNOSIS — N1832 Chronic kidney disease, stage 3b: Secondary | ICD-10-CM | POA: Diagnosis not present

## 2022-02-16 DIAGNOSIS — N189 Chronic kidney disease, unspecified: Secondary | ICD-10-CM | POA: Diagnosis not present

## 2022-02-16 DIAGNOSIS — E559 Vitamin D deficiency, unspecified: Secondary | ICD-10-CM | POA: Diagnosis not present

## 2022-02-16 DIAGNOSIS — E211 Secondary hyperparathyroidism, not elsewhere classified: Secondary | ICD-10-CM | POA: Diagnosis not present

## 2022-02-16 DIAGNOSIS — D472 Monoclonal gammopathy: Secondary | ICD-10-CM | POA: Diagnosis not present

## 2022-02-16 DIAGNOSIS — D631 Anemia in chronic kidney disease: Secondary | ICD-10-CM | POA: Diagnosis not present

## 2022-03-02 DIAGNOSIS — D472 Monoclonal gammopathy: Secondary | ICD-10-CM | POA: Diagnosis not present

## 2022-03-02 DIAGNOSIS — N189 Chronic kidney disease, unspecified: Secondary | ICD-10-CM | POA: Diagnosis not present

## 2022-03-02 DIAGNOSIS — D631 Anemia in chronic kidney disease: Secondary | ICD-10-CM | POA: Diagnosis not present

## 2022-03-02 DIAGNOSIS — E211 Secondary hyperparathyroidism, not elsewhere classified: Secondary | ICD-10-CM | POA: Diagnosis not present

## 2022-03-02 DIAGNOSIS — N1832 Chronic kidney disease, stage 3b: Secondary | ICD-10-CM | POA: Diagnosis not present

## 2022-03-02 DIAGNOSIS — I129 Hypertensive chronic kidney disease with stage 1 through stage 4 chronic kidney disease, or unspecified chronic kidney disease: Secondary | ICD-10-CM | POA: Diagnosis not present

## 2022-03-13 DIAGNOSIS — E039 Hypothyroidism, unspecified: Secondary | ICD-10-CM | POA: Diagnosis not present

## 2022-03-13 DIAGNOSIS — I10 Essential (primary) hypertension: Secondary | ICD-10-CM | POA: Diagnosis not present

## 2022-04-06 ENCOUNTER — Other Ambulatory Visit (HOSPITAL_COMMUNITY): Payer: Medicare PPO

## 2022-04-06 ENCOUNTER — Inpatient Hospital Stay: Payer: Medicare PPO | Attending: Hematology

## 2022-04-06 DIAGNOSIS — Z79899 Other long term (current) drug therapy: Secondary | ICD-10-CM | POA: Diagnosis not present

## 2022-04-06 DIAGNOSIS — Z87891 Personal history of nicotine dependence: Secondary | ICD-10-CM | POA: Insufficient documentation

## 2022-04-06 DIAGNOSIS — D472 Monoclonal gammopathy: Secondary | ICD-10-CM | POA: Insufficient documentation

## 2022-04-06 DIAGNOSIS — D649 Anemia, unspecified: Secondary | ICD-10-CM | POA: Diagnosis not present

## 2022-04-06 LAB — COMPREHENSIVE METABOLIC PANEL
ALT: 13 U/L (ref 0–44)
AST: 15 U/L (ref 15–41)
Albumin: 3.9 g/dL (ref 3.5–5.0)
Alkaline Phosphatase: 113 U/L (ref 38–126)
Anion gap: 7 (ref 5–15)
BUN: 27 mg/dL — ABNORMAL HIGH (ref 8–23)
CO2: 28 mmol/L (ref 22–32)
Calcium: 8.5 mg/dL — ABNORMAL LOW (ref 8.9–10.3)
Chloride: 104 mmol/L (ref 98–111)
Creatinine, Ser: 1.82 mg/dL — ABNORMAL HIGH (ref 0.61–1.24)
GFR, Estimated: 36 mL/min — ABNORMAL LOW (ref >60)
Glucose, Bld: 101 mg/dL — ABNORMAL HIGH (ref 70–99)
Potassium: 4.2 mmol/L (ref 3.5–5.1)
Sodium: 139 mmol/L (ref 135–145)
Total Bilirubin: 0.5 mg/dL (ref 0.3–1.2)
Total Protein: 7.4 g/dL (ref 6.5–8.1)

## 2022-04-06 LAB — CBC WITH DIFFERENTIAL/PLATELET
Abs Immature Granulocytes: 0.01 10*3/uL (ref 0.00–0.07)
Basophils Absolute: 0 10*3/uL (ref 0.0–0.1)
Basophils Relative: 1 %
Eosinophils Absolute: 0.2 10*3/uL (ref 0.0–0.5)
Eosinophils Relative: 4 %
HCT: 36.9 % — ABNORMAL LOW (ref 39.0–52.0)
Hemoglobin: 11.5 g/dL — ABNORMAL LOW (ref 13.0–17.0)
Immature Granulocytes: 0 %
Lymphocytes Relative: 41 %
Lymphs Abs: 2.6 10*3/uL (ref 0.7–4.0)
MCH: 26.6 pg (ref 26.0–34.0)
MCHC: 31.2 g/dL (ref 30.0–36.0)
MCV: 85.4 fL (ref 80.0–100.0)
Monocytes Absolute: 0.7 10*3/uL (ref 0.1–1.0)
Monocytes Relative: 11 %
Neutro Abs: 2.8 10*3/uL (ref 1.7–7.7)
Neutrophils Relative %: 43 %
Platelets: 211 10*3/uL (ref 150–400)
RBC: 4.32 MIL/uL (ref 4.22–5.81)
RDW: 15.4 % (ref 11.5–15.5)
WBC: 6.4 10*3/uL (ref 4.0–10.5)
nRBC: 0 % (ref 0.0–0.2)

## 2022-04-06 LAB — IRON AND TIBC
Iron: 52 ug/dL (ref 45–182)
Saturation Ratios: 22 % (ref 17.9–39.5)
TIBC: 237 ug/dL — ABNORMAL LOW (ref 250–450)
UIBC: 185 ug/dL

## 2022-04-06 LAB — FERRITIN: Ferritin: 276 ng/mL (ref 24–336)

## 2022-04-10 LAB — PROTEIN ELECTROPHORESIS, SERUM
A/G Ratio: 1.2 (ref 0.7–1.7)
Albumin ELP: 3.8 g/dL (ref 2.9–4.4)
Alpha-1-Globulin: 0.2 g/dL (ref 0.0–0.4)
Alpha-2-Globulin: 0.7 g/dL (ref 0.4–1.0)
Beta Globulin: 0.8 g/dL (ref 0.7–1.3)
Gamma Globulin: 1.4 g/dL (ref 0.4–1.8)
Globulin, Total: 3.1 g/dL (ref 2.2–3.9)
M-Spike, %: 0.9 g/dL — ABNORMAL HIGH
Total Protein ELP: 6.9 g/dL (ref 6.0–8.5)

## 2022-04-13 ENCOUNTER — Encounter: Payer: Self-pay | Admitting: Hematology

## 2022-04-13 ENCOUNTER — Inpatient Hospital Stay: Payer: Medicare PPO | Admitting: Hematology

## 2022-04-13 ENCOUNTER — Other Ambulatory Visit: Payer: Self-pay

## 2022-04-13 VITALS — BP 127/66 | HR 70 | Temp 96.9°F | Resp 18 | Ht 60.43 in | Wt 164.5 lb

## 2022-04-13 DIAGNOSIS — Z79899 Other long term (current) drug therapy: Secondary | ICD-10-CM | POA: Diagnosis not present

## 2022-04-13 DIAGNOSIS — D472 Monoclonal gammopathy: Secondary | ICD-10-CM

## 2022-04-13 DIAGNOSIS — N1832 Chronic kidney disease, stage 3b: Secondary | ICD-10-CM | POA: Diagnosis not present

## 2022-04-13 DIAGNOSIS — I10 Essential (primary) hypertension: Secondary | ICD-10-CM | POA: Diagnosis not present

## 2022-04-13 DIAGNOSIS — D649 Anemia, unspecified: Secondary | ICD-10-CM | POA: Diagnosis not present

## 2022-04-13 DIAGNOSIS — Z87891 Personal history of nicotine dependence: Secondary | ICD-10-CM | POA: Diagnosis not present

## 2022-04-13 NOTE — Progress Notes (Signed)
[place], [place] [address]   CLINIC:  Medical Oncology/Hematology  PCP:  Carrolyn Meiers, MD [place] / [place] [address]  [phone]  REASON FOR VISIT:  Follow-up for MGUS  PRIOR THERAPY: none  CURRENT THERAPY: Iron tablet once daily  INTERVAL HISTORY:  [name], a 88 y.o. male, seen for follow-up of MGUS.  He denies any new onset pains.  Denies any tingling or numbness in extremities.  Denies any fevers, night sweats or weight loss.  No infections reported.  REVIEW OF SYSTEMS:  Review of Systems  Constitutional:  Negative for appetite change and fatigue.  Gastrointestinal:  Negative for constipation.  All other systems reviewed and are negative.   PAST MEDICAL/SURGICAL HISTORY:  Past Medical History:  Diagnosis Date   Blood transfusion without reported diagnosis    Chronic anemia    Enlarged prostate    Elevated PSA   Esophagitis    High grade squamous dysplasia occurring in the background of chronic active esophagitis,, EGD Dr. Christoper Fabian benign biopsies February 2005   GERD (gastroesophageal reflux disease)    History of BPH    s/p TURP 2008   Hypertension    Ileus ([place])    Mallory - Weiss tear    Thyroid disease    Upper GI bleed    Past Surgical History:  Procedure Laterality Date   CENTRAL VENOUS CATHETER INSERTION  04/26/2012   Procedure: INSERTION CENTRAL LINE ADULT;  Surgeon: Donato Heinz, MD;  Location: [facility];  Service: General;  Laterality: Left;  started at 0919, ended at 0930,        subclavian   CHOLECYSTECTOMY     COLONOSCOPY  10/29/03   rourk-inflammatory polyp from 40 cm removed   COLONOSCOPY  10/02/2012   Dr. Gala Romney: normal   ESOPHAGOGASTRODUODENOSCOPY  08/31/08   Rourk-possible extrinsic impression on midesophagus, noncritical Schatzki's ring, 3 mm fundal gland polyp, multiple gastric fundic polyps not manipulated, small hiatal hernia : CT chest showed mediastinal  mass, eventually underwent thyroidectomy   ESOPHAGOGASTRODUODENOSCOPY  08/27/2012   Dr. Cristina Gong: moderate sized hiatal hernia, cameron lesions   LAPAROTOMY  04/26/2012   Procedure: EXPLORATORY LAPAROTOMY;  Surgeon: Donato Heinz, MD;  Location: [facility];  Service: General;  Laterality: N/A;   THYROIDECTOMY     TRANSURETHRAL RESECTION OF PROSTATE     pt denies, but this is listed in his past medical records.     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[place]: [employer]  Tobacco Use   Smoking status: Former    Packs/day: 0.50    Types: Cigarettes    Quit date: 09/10/1992    Years since quitting: 29.6   Smokeless tobacco: Never  Substance and Sexual Activity   Alcohol use: No   Drug use: No   Sexual activity: Never  Other Topics Concern   Not on file  Social History Narrative   Not on file   Social Determinants of Health   Financial Resource Strain: Not on file  Food Insecurity: Not on file  Transportation Needs: Not on file  Physical Activity: Not on file  Stress: Not  on file  Social Connections: Not on file  Intimate Partner Violence: Not on file    FAMILY HISTORY:  Family History  Problem Relation Age of Onset   Cancer Other    Heart failure Other    Liver disease Sister    Breast cancer Sister    Hypertension Mother    Stroke Father    Colon cancer Neg Hx     CURRENT MEDICATIONS:  Current Outpatient Medications  Medication Sig Dispense Refill   calcitRIOL (ROCALTROL) 0.25 MCG capsule Take 0.25 mcg by mouth daily.     diltiazem (CARDIZEM CD) 180 MG 24 hr capsule Take 180 mg by mouth daily.     ferrous sulfate 325 (65 FE) MG EC tablet Take by mouth.     finasteride (PROSCAR) 5 MG tablet Take 5 mg by  mouth daily.  3   guaiFENesin-dextromethorphan (ROBITUSSIN DM) 100-10 MG/5ML syrup Take 10 mLs by mouth every 6 (six) hours. 118 mL 0   HYDROcodone-acetaminophen (NORCO/VICODIN) 5-325 MG tablet Take 1 tablet by mouth every 6 (six) hours as needed for severe pain. 15 tablet 0   levothyroxine (SYNTHROID) 112 MCG tablet Take by mouth.     lisinopril (ZESTRIL) 20 MG tablet Take by mouth.     pantoprazole (PROTONIX) 40 MG tablet Take 1 tablet (40 mg total) by mouth daily. 30 tablet 1   potassium chloride SA (KLOR-CON) 20 MEQ tablet Take 20 mEq by mouth daily.     tamsulosin (FLOMAX) 0.4 MG CAPS capsule Take 0.4 mg by mouth daily.     Torsemide 40 MG TABS Take 40 mg by mouth daily. 30 tablet 0   No current facility-administered medications for this visit.    ALLERGIES:  No Known Allergies  PHYSICAL EXAM:  Performance status (ECOG): 1 - Symptomatic but completely ambulatory  Vitals:   04/13/22 1506  BP: 127/66  Pulse: 70  Resp: 18  Temp: (!) 96.9 F (36.1 C)  SpO2: 100%   Wt Readings from Last 3 Encounters:  04/13/22 164 lb 7.4 oz (74.6 kg)  01/03/22 160 lb 3.2 oz (72.7 kg)  10/12/21 158 lb 3.2 oz (71.8 kg)   Physical Exam Vitals reviewed.  Constitutional:      Appearance: Normal appearance.  Cardiovascular:     Rate and Rhythm: Normal rate and regular rhythm.     Pulses: Normal pulses.     Heart sounds: Normal heart sounds.  Pulmonary:     Effort: Pulmonary effort is normal.     Breath sounds: Normal breath sounds.  Neurological:     General: No focal deficit present.     Mental Status: He is alert and oriented to person, place, and time.  Psychiatric:        Mood and Affect: Mood normal.        Behavior: Behavior normal.     LABORATORY DATA:  I have reviewed the labs as listed.     Latest Ref Rng & Units 04/06/2022    3:11 PM 09/19/2021    4:05 PM 12/04/2020   12:16 AM  CBC  WBC 4.0 - 10.5 K/uL 6.4  7.5  8.2   Hemoglobin 13.0 - 17.0 g/dL 11.5  11.0  8.8    Hematocrit 39.0 - 52.0 % 36.9  34.4  27.8   Platelets 150 - 400 K/uL 211  235  346       Latest Ref Rng & Units 04/06/2022    3:11 PM 09/19/2021    4:05 PM 12/05/2020  1:36 AM  CMP  Glucose 70 - 99 mg/dL 101  108  135   BUN 8 - 23 mg/dL 27  32  14   Creatinine 0.61 - 1.24 mg/dL 1.82  2.01  1.77   Sodium 135 - 145 mmol/L 139  138  136   Potassium 3.5 - 5.1 mmol/L 4.2  4.0  3.6   Chloride 98 - 111 mmol/L 104  103  97   CO2 22 - 32 mmol/L '28  27  30   '$ Calcium 8.9 - 10.3 mg/dL 8.5  7.9  7.3   Total Protein 6.5 - 8.1 g/dL 7.4  7.0    Total Bilirubin 0.3 - 1.2 mg/dL 0.5  0.6    Alkaline Phos 38 - 126 U/L 113  119    AST 15 - 41 U/L 15  25    ALT 0 - 44 U/L 13  31        Component Value Date/Time   RBC 4.32 04/06/2022 1511   MCV 85.4 04/06/2022 1511   MCH 26.6 04/06/2022 1511   MCHC 31.2 04/06/2022 1511   RDW 15.4 04/06/2022 1511   LYMPHSABS 2.6 04/06/2022 1511   MONOABS 0.7 04/06/2022 1511   EOSABS 0.2 04/06/2022 1511   BASOSABS 0.0 04/06/2022 1511    DIAGNOSTIC IMAGING:  I have independently reviewed the scans and discussed with the patient. No results found.   ASSESSMENT:  IgG lambda MGUS: - Patient seen at the request of Dr. Theador Hawthorne - Labs on 07/22/2021 with SPEP 1.1 g of M spike.  Immunofixation with IgG lambda. - 24-hour urine for total protein on 02/28/2021-135 mg.    Social/family history: - He lives by himself at home. 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  He quit smoking in the 1980s. - Sister had breast cancer.  Brother had prostate cancer.   PLAN:  IgG lambda MGUS: - Reviewed labs from 04/06/2022.  Creatinine is 1.82.  Calcium was 8.5.  M spike is 0.9 g, slightly increased from 0.8 g previously.  Hemoglobin 11.5. - He does not have "crab" features. - RTC 6 months with repeat myeloma labs and skeletal survey.  2.  Normocytic anemia: - Hemoglobin is 11.5.  Ferritin is 276 and percent saturation 22. - Continue  iron tablet daily.  Will repeat labs in 6 months.  Orders placed this encounter:  Orders Placed This Encounter  Procedures   CBC with Differential   Comprehensive metabolic panel   Protein electrophoresis, serum   Ferritin   Iron and TIBC ([facility] ONLY)      Derek Jack, MD [place] [phone]

## 2022-04-13 NOTE — Patient Instructions (Signed)
[place] at [facility] Discharge Instructions  You were seen and examined today by Dr. Delton Coombes.  Dr. Delton Coombes discussed your most recent lab work which is stable. Continue regular follow-ups as recommended by Dr. Delton Coombes.  Thank you for choosing [place] at [facility] to provide your oncology and hematology care.  To afford each patient quality time with our provider, please arrive at least 15 minutes before your scheduled appointment time.   If you have a lab appointment with the [facility] please come in thru the Main Entrance and check in at the main information desk.  You need to re-schedule your appointment should you arrive 10 or more minutes late.  We strive to give you quality time with our providers, and arriving late affects you and other patients whose appointments are after yours.  Also, if you no show three or more times for appointments you may be dismissed from the clinic at the providers discretion.     Again, thank you for choosing [facility].  Our hope is that these requests will decrease the amount of time that you wait before being seen by our physicians.       _____________________________________________________________  Should you have questions after your visit to [facility], please contact our office at [phone] and follow the prompts.  Our office hours are 8:00 a.m. and 4:30 p.m. Monday - Friday.  Please note that voicemails left after 4:00 p.m. may not be returned until the following business day.  We are closed weekends and major holidays.  You do have access to a nurse 24-7, just call the main number to the clinic [phone] and do not press any options, hold on the line and a nurse will answer the phone.    For prescription refill requests, have your pharmacy contact our office and allow 72 hours.

## 2022-05-03 DIAGNOSIS — D472 Monoclonal gammopathy: Secondary | ICD-10-CM | POA: Diagnosis not present

## 2022-05-03 DIAGNOSIS — N189 Chronic kidney disease, unspecified: Secondary | ICD-10-CM | POA: Diagnosis not present

## 2022-05-03 DIAGNOSIS — D631 Anemia in chronic kidney disease: Secondary | ICD-10-CM | POA: Diagnosis not present

## 2022-05-03 DIAGNOSIS — N1832 Chronic kidney disease, stage 3b: Secondary | ICD-10-CM | POA: Diagnosis not present

## 2022-05-03 DIAGNOSIS — E211 Secondary hyperparathyroidism, not elsewhere classified: Secondary | ICD-10-CM | POA: Diagnosis not present

## 2022-05-03 DIAGNOSIS — I129 Hypertensive chronic kidney disease with stage 1 through stage 4 chronic kidney disease, or unspecified chronic kidney disease: Secondary | ICD-10-CM | POA: Diagnosis not present

## 2022-05-11 DIAGNOSIS — N189 Chronic kidney disease, unspecified: Secondary | ICD-10-CM | POA: Diagnosis not present

## 2022-05-11 DIAGNOSIS — I129 Hypertensive chronic kidney disease with stage 1 through stage 4 chronic kidney disease, or unspecified chronic kidney disease: Secondary | ICD-10-CM | POA: Diagnosis not present

## 2022-05-11 DIAGNOSIS — E211 Secondary hyperparathyroidism, not elsewhere classified: Secondary | ICD-10-CM | POA: Diagnosis not present

## 2022-05-11 DIAGNOSIS — D631 Anemia in chronic kidney disease: Secondary | ICD-10-CM | POA: Diagnosis not present

## 2022-05-11 DIAGNOSIS — N1832 Chronic kidney disease, stage 3b: Secondary | ICD-10-CM | POA: Diagnosis not present

## 2022-05-14 DIAGNOSIS — I10 Essential (primary) hypertension: Secondary | ICD-10-CM | POA: Diagnosis not present

## 2022-05-14 DIAGNOSIS — N1832 Chronic kidney disease, stage 3b: Secondary | ICD-10-CM | POA: Diagnosis not present

## 2022-06-12 DIAGNOSIS — E039 Hypothyroidism, unspecified: Secondary | ICD-10-CM | POA: Diagnosis not present

## 2022-06-12 DIAGNOSIS — N1832 Chronic kidney disease, stage 3b: Secondary | ICD-10-CM | POA: Diagnosis not present

## 2022-06-12 DIAGNOSIS — I10 Essential (primary) hypertension: Secondary | ICD-10-CM | POA: Diagnosis not present

## 2022-06-12 DIAGNOSIS — Z23 Encounter for immunization: Secondary | ICD-10-CM | POA: Diagnosis not present

## 2022-07-13 DIAGNOSIS — E039 Hypothyroidism, unspecified: Secondary | ICD-10-CM | POA: Diagnosis not present

## 2022-07-13 DIAGNOSIS — I10 Essential (primary) hypertension: Secondary | ICD-10-CM | POA: Diagnosis not present

## 2022-08-10 DIAGNOSIS — N1832 Chronic kidney disease, stage 3b: Secondary | ICD-10-CM | POA: Diagnosis not present

## 2022-08-10 DIAGNOSIS — I10 Essential (primary) hypertension: Secondary | ICD-10-CM | POA: Diagnosis not present

## 2022-08-10 DIAGNOSIS — E669 Obesity, unspecified: Secondary | ICD-10-CM | POA: Diagnosis not present

## 2022-08-12 DIAGNOSIS — E039 Hypothyroidism, unspecified: Secondary | ICD-10-CM | POA: Diagnosis not present

## 2022-08-12 DIAGNOSIS — I10 Essential (primary) hypertension: Secondary | ICD-10-CM | POA: Diagnosis not present

## 2022-09-15 DIAGNOSIS — D631 Anemia in chronic kidney disease: Secondary | ICD-10-CM | POA: Diagnosis not present

## 2022-09-15 DIAGNOSIS — I129 Hypertensive chronic kidney disease with stage 1 through stage 4 chronic kidney disease, or unspecified chronic kidney disease: Secondary | ICD-10-CM | POA: Diagnosis not present

## 2022-09-15 DIAGNOSIS — N184 Chronic kidney disease, stage 4 (severe): Secondary | ICD-10-CM | POA: Diagnosis not present

## 2022-09-15 DIAGNOSIS — D472 Monoclonal gammopathy: Secondary | ICD-10-CM | POA: Diagnosis not present

## 2022-09-15 DIAGNOSIS — E211 Secondary hyperparathyroidism, not elsewhere classified: Secondary | ICD-10-CM | POA: Diagnosis not present

## 2022-09-15 DIAGNOSIS — N189 Chronic kidney disease, unspecified: Secondary | ICD-10-CM | POA: Diagnosis not present

## 2022-09-23 ENCOUNTER — Emergency Department (HOSPITAL_BASED_OUTPATIENT_CLINIC_OR_DEPARTMENT_OTHER): Payer: Medicare PPO

## 2022-09-23 ENCOUNTER — Emergency Department (HOSPITAL_BASED_OUTPATIENT_CLINIC_OR_DEPARTMENT_OTHER)
Admission: EM | Admit: 2022-09-23 | Discharge: 2022-10-05 | Disposition: E | Payer: Medicare PPO | Attending: Emergency Medicine | Admitting: Emergency Medicine

## 2022-09-23 DIAGNOSIS — I10 Essential (primary) hypertension: Secondary | ICD-10-CM | POA: Insufficient documentation

## 2022-09-23 DIAGNOSIS — X58XXXA Exposure to other specified factors, initial encounter: Secondary | ICD-10-CM | POA: Insufficient documentation

## 2022-09-23 DIAGNOSIS — T17908A Unspecified foreign body in respiratory tract, part unspecified causing other injury, initial encounter: Secondary | ICD-10-CM

## 2022-09-23 DIAGNOSIS — Z79899 Other long term (current) drug therapy: Secondary | ICD-10-CM | POA: Insufficient documentation

## 2022-09-23 DIAGNOSIS — R569 Unspecified convulsions: Secondary | ICD-10-CM | POA: Diagnosis not present

## 2022-09-23 DIAGNOSIS — R000 Tachycardia, unspecified: Secondary | ICD-10-CM | POA: Diagnosis not present

## 2022-09-23 DIAGNOSIS — R0602 Shortness of breath: Secondary | ICD-10-CM | POA: Diagnosis not present

## 2022-09-23 DIAGNOSIS — R059 Cough, unspecified: Secondary | ICD-10-CM | POA: Diagnosis present

## 2022-09-23 DIAGNOSIS — T17900A Unspecified foreign body in respiratory tract, part unspecified causing asphyxiation, initial encounter: Secondary | ICD-10-CM | POA: Diagnosis not present

## 2022-09-23 DIAGNOSIS — R0603 Acute respiratory distress: Secondary | ICD-10-CM | POA: Diagnosis not present

## 2022-10-05 NOTE — ED Notes (Signed)
Spoke with Arnoldo Morale in Medical Examiner's office, pt cleared from being an ME case and okay given to move patient direct to funeral home.

## 2022-10-05 NOTE — ED Notes (Addendum)
Entered room prior to triage being completed, at approximately 1140.  Dr Tamera Punt in room at that time.  Per history from family, pt had been vomiting and had a seizure like episode just prior to coming into ED.  IV started, blood drawn, pt alert, tachypneic and tachycardic.  4L O2 applied via [place]. Dr Tamera Punt left the room.  While blood specimen being transferred to sample tubes and portable chest Xray being performed, Pt began vomiting.  Emesis dark brown spilling from oral and nasal cavities.  Pt stopped breathing.  Code called.  Dr Tamera Punt alerted and returned to room.  Manual respirations provided by RT at bedisde. Pt suctioning performed, zoll pads applied, no pulse present, chest compressions started and '1mg'$  IV epinephrine administered.  Family in hallway entered room and asked that all life-saving measures be stopped. Daughter in room states she is health-care power of attorney and that staff is instructed to stop all life-saving measures. Dr Tamera Punt confirmed with patient's family and daughter that all measures are to be stopped.  Time of death called at 1156.

## 2022-10-05 NOTE — ED Triage Notes (Signed)
Upon arrival, we were summoned to our lobby with witnesses there telling us that pt. Is "having a seizure". Upon our arrival to the lobby (~20 seconds) pt. Was drowsy and awake. He was taken immediately to rm. 2 where he was undressed and V.S. including EKG performed. All of our efforts since have been to obtain labs/IV access.

## 2022-10-05 NOTE — ED Notes (Signed)
Pt. Has been awake; and IV access was obtained, as were lab tubes by Apolonio Schneiders, Therapist, sports. Shortly after obtaining the IV/labs, pt. Began to vomit copious amounts of dark liquid. He was found to be pulseless, and Dr. Tamera Punt arrived immediately and started CPR. 38 male family members were present and witnessed these events. CPR was performed for a time; and upon obtaining advanced airway equipment, pt. Was observed to be in fine VF/asystole, at which time our medical team was instructed by his relative to cease all heroic efforts, which we did.

## 2022-10-05 NOTE — ED Notes (Signed)
Spoke with Fritz Pickerel at [employer] and they will come collect patient for transport to funeral home.

## 2022-10-05 NOTE — ED Notes (Signed)
Called Dyke Maes, daughter, to advise her of pending patient transport by funeral home.

## 2022-10-05 NOTE — ED Provider Notes (Signed)
[facility] Provider Note   CSN: [id number] Arrival date & time: 2022-10-22  1122     History  Chief Complaint  Patient presents with   Seizures    Victor Hunter is a 89 y.o. male.  Patient is a 89year-old male who presents with shortness of breath.  He has a history of esophagitis, BPH and hypertension.  No prior lung disease.  Per patient and family, he has had some cough and shortness of breath for the last 2 to 3 days.  He is coughing up some green and black sputum.  He has some abdominal distention but no abdominal pain.  No nausea or vomiting.  No fevers.  No nasal congestion.  No leg swelling.  It sounds like this morning he had 2 near syncopal type events, 1 when he was getting dressed to come here and mom when he was getting out of the car.  No witnessed shaking or seizure-like activity was noted per family.  He did not report any injuries related to these events.  On chart review, he had a similar admission about 2 years ago for shortness of breath that turned out to be a small bowel obstruction.  He has had prior small bowel obstructions that have not resolved with conservative treatment and never required surgery.       Home Medications Prior to Admission medications   Medication Sig Start Date End Date Taking? Authorizing Provider  calcitRIOL (ROCALTROL) 0.25 MCG capsule Take 0.25 mcg by mouth daily.    [provider]  diltiazem (CARDIZEM CD) 180 MG 24 hr capsule Take 180 mg by mouth daily. 10/11/20   [provider]  finasteride (PROSCAR) 5 MG tablet Take 5 mg by mouth daily. 08/03/15   [provider]  guaiFENesin-dextromethorphan (ROBITUSSIN DM) 100-10 MG/5ML syrup Take 10 mLs by mouth every 6 (six) hours. 12/05/20   Kathie Dike, MD  HYDROcodone-acetaminophen (NORCO/VICODIN) 5-325 MG tablet Take 1 tablet by mouth every 6 (six) hours as needed for severe pain. 02/12/17   Carole Civil, MD   levothyroxine (SYNTHROID) 112 MCG tablet Take by mouth. 04/28/16   [provider]  lisinopril (ZESTRIL) 20 MG tablet Take by mouth. 04/02/18   [provider]  pantoprazole (PROTONIX) 40 MG tablet Take 1 tablet (40 mg total) by mouth daily. 12/05/20   Kathie Dike, MD  potassium chloride SA (KLOR-CON) 20 MEQ tablet Take 20 mEq by mouth daily. 09/12/20   [provider]  tamsulosin (FLOMAX) 0.4 MG CAPS capsule Take 0.4 mg by mouth daily. 10/08/20   [provider]  Torsemide 40 MG TABS Take 40 mg by mouth daily. 12/06/20   Kathie Dike, MD      Allergies    Patient has no known allergies.    Review of Systems   Review of Systems  Constitutional:  Positive for fatigue. Negative for chills, diaphoresis and fever.  HENT:  Negative for congestion, rhinorrhea and sneezing.   Eyes: Negative.   Respiratory:  Positive for cough and shortness of breath. Negative for chest tightness.   Cardiovascular:  Negative for chest pain and leg swelling.  Gastrointestinal:  Positive for abdominal distention. Negative for abdominal pain, blood in stool, diarrhea, nausea and vomiting.  Genitourinary:  Negative for difficulty urinating, flank pain, frequency and hematuria.  Musculoskeletal:  Negative for arthralgias and back pain.  Skin:  Negative for rash.  Neurological:  Negative for dizziness, speech difficulty, weakness, numbness and headaches.  Physical Exam Updated Vital Signs There were no vitals taken for this visit. Physical Exam Constitutional:      General: He is in acute distress.     Appearance: He is well-developed. He is ill-appearing.  HENT:     Head: Normocephalic and atraumatic.  Eyes:     Pupils: Pupils are equal, round, and reactive to light.  Cardiovascular:     Rate and Rhythm: Regular rhythm. Tachycardia present.     Heart sounds: Normal heart sounds.  Pulmonary:     Effort: Pulmonary effort is normal. Tachypnea present. No respiratory  distress.     Breath sounds: Rales present. No wheezing.  Chest:     Chest wall: No tenderness.  Abdominal:     General: Bowel sounds are normal.     Palpations: Abdomen is soft.     Tenderness: There is no abdominal tenderness. There is no guarding or rebound.  Musculoskeletal:        General: Normal range of motion.     Cervical back: Normal range of motion and neck supple.     Comments: No edema or calf tenderness  Lymphadenopathy:     Cervical: No cervical adenopathy.  Skin:    General: Skin is warm and dry.     Findings: No rash.  Neurological:     Mental Status: He is alert and oriented to person, place, and time.     ED Results / Procedures / Treatments   Labs (all labs ordered are listed, but only abnormal results are displayed) Labs Reviewed  RESP PANEL BY RT-PCR (RSV, FLU A&B, COVID)  [id number]  CULTURE, BLOOD (ROUTINE X 2)  CULTURE, BLOOD (ROUTINE X 2)  BRAIN NATRIURETIC PEPTIDE  LACTIC ACID, PLASMA  LACTIC ACID, PLASMA  CBC WITH DIFFERENTIAL/PLATELET  COMPREHENSIVE METABOLIC PANEL  PROTIME-INR  APTT  URINALYSIS, ROUTINE W REFLEX MICROSCOPIC  I-STAT ARTERIAL BLOOD GAS, ED    EKG EKG Interpretation  Date/Time:  10-12-22 11:28:31 EST Ventricular Rate:  137 PR Interval:  195 QRS Duration: 87 QT Interval:  331 QTC Calculation: 500 R Axis:   4 Text Interpretation: Sinus tachycardia with irregular rate Abnormal R-wave progression, early transition ST depression, probably rate related ST elevation, consider inferior injury Borderline prolonged QT interval Confirmed by Malvin Johns [phone]) on 10-12-22 12:02:50 PM  Radiology DG Chest Port 1 View  Result Date: Oct 12, 2022 CLINICAL DATA:  Shortness of breath. EXAM: PORTABLE CHEST 1 VIEW COMPARISON:  12/01/2020 FINDINGS: 1148 hours. Low volume film. Cardiopericardial silhouette is at upper limits of normal for size. The lungs are clear without focal pneumonia, edema, pneumothorax or pleural  effusion. Interstitial markings are diffusely coarsened with chronic features. Surgical clips in the lower neck compatible with prior thyroidectomy. Convex rightward thoracic scoliosis evident. Telemetry leads overlie the chest. IMPRESSION: Low volume film without acute cardiopulmonary findings. Cardiomegaly with chronic interstitial coarsening. Electronically Signed   By: Misty Stanley M.D.   On: 10-12-2022 12:08    Procedures Procedures    Medications Ordered in ED Medications - No data to display  ED Course/ Medical Decision Making/ A&P                             Medical Decision Making Amount and/or Complexity of Data Reviewed Labs: ordered. Radiology: ordered.   Patient is a 89 year old male who presents with respiratory distress.  He has diffuse Rales in all lung fields.  He has some tachypnea but  on arrival was placed on oxygen and seems to be calming down a bit on the oxygen.  He is maintaining normal oxygen saturations in the mid 90s on the oxygen.  He is alert and talking in full sentences.  Chest x-ray as well as KUB was ordered as well as labs.  After chart review, I was concerned about a possible bowel obstruction given that he presented similarly about 2 years ago with mostly shortness of breath and abdominal distention.  He seems to have abdominal distention today although he is not complaining of any abdominal pain or vomiting.  While we were obtaining x-rays in the ED, patient became unresponsive and was only breathing a few times a minute with poor respiratory effort.  He had profuse vomiting of black coffee-ground looking emesis.  Shortly after he was noted to be in cardiac arrest.  He was suctioned profusely.  He was ventilated with bag-valve-mask.  CPR was started.  Patient was noted to be in asystole.  Epinephrine was ordered.  As we were preparing to intubate the patient, his daughters who were outside came in the room and asked [place] to stop doing CPR.  She reports herself to  be the patient's healthcare power of attorney and says that he has passed and she does not want any further interventions done.  CPR was stopped per family request.  Patient was cleaned up.  I suspect patient had a small bowel obstruction that resulted in the massive vomiting/aspiration, leading to his demise.  I attempted to call the patient's primary care doctor, Dr. Legrand Rams he was in [facility] regarding signing of the death certificate.  It takes me to a voicemail and it says the voicemail is full.  He will have to be contacted on Monday.  His chest x-ray was reviewed by me.  There is poor inspiratory effort.  The abdomen was not well-visualized.  No obvious pneumonia or overt pulmonary edema.  This was interpreted by me and can firmed by the radiologist.  Cardiopulmonary Resuscitation (CPR) Procedure Note Directed/Performed by: Malvin Johns I personally directed ancillary staff and/or performed CPR in an effort to regain return of spontaneous circulation and to maintain cardiac, neuro and systemic perfusion.    CRITICAL CARE Performed by: Malvin Johns Total critical care time: 40 minutes Critical care time was exclusive of separately billable procedures and treating other patients. Critical care was necessary to treat or prevent imminent or life-threatening deterioration. Critical care was time spent personally by me on the following activities: development of treatment plan with patient and/or surrogate as well as nursing, discussions with consultants, evaluation of patient's response to treatment, examination of patient, obtaining history from patient or surrogate, ordering and performing treatments and interventions, ordering and review of laboratory studies, ordering and review of radiographic studies, pulse oximetry and re-evaluation of patient's condition.   Final Clinical Impression(s) / ED Diagnoses Final diagnoses:  Respiratory distress  Aspiration into airway,  initial encounter    Rx / DC Orders ED Discharge Orders     None         Malvin Johns, MD Sep 26, 2022 1248

## 2022-10-05 DEATH — deceased

## 2022-10-12 ENCOUNTER — Other Ambulatory Visit: Payer: Medicare PPO

## 2022-10-19 ENCOUNTER — Ambulatory Visit: Payer: Medicare PPO | Admitting: Hematology
